# Patient Record
Sex: Male | Born: 1935 | Race: White | Hispanic: No | Smoking: Former smoker
Health system: Southern US, Community
[De-identification: ages and names within clinical notes are randomized; demographics above are authoritative.]

## PROBLEM LIST (undated history)

## (undated) DIAGNOSIS — E7800 Pure hypercholesterolemia, unspecified: Secondary | ICD-10-CM

## (undated) DIAGNOSIS — G20A1 Parkinson's disease without dyskinesia, without mention of fluctuations: Secondary | ICD-10-CM

## (undated) DIAGNOSIS — H5702 Anisocoria: Secondary | ICD-10-CM

## (undated) DIAGNOSIS — I951 Orthostatic hypotension: Secondary | ICD-10-CM

## (undated) DIAGNOSIS — G909 Disorder of the autonomic nervous system, unspecified: Secondary | ICD-10-CM

## (undated) DIAGNOSIS — M1990 Unspecified osteoarthritis, unspecified site: Secondary | ICD-10-CM

## (undated) DIAGNOSIS — E0590 Thyrotoxicosis, unspecified without thyrotoxic crisis or storm: Secondary | ICD-10-CM

## (undated) DIAGNOSIS — D649 Anemia, unspecified: Secondary | ICD-10-CM

## (undated) DIAGNOSIS — K219 Gastro-esophageal reflux disease without esophagitis: Secondary | ICD-10-CM

## (undated) DIAGNOSIS — I517 Cardiomegaly: Secondary | ICD-10-CM

## (undated) DIAGNOSIS — G20 Parkinson's disease: Secondary | ICD-10-CM

## (undated) DIAGNOSIS — H9191 Unspecified hearing loss, right ear: Secondary | ICD-10-CM

## (undated) HISTORY — DX: Thyrotoxicosis, unspecified without thyrotoxic crisis or storm: E05.90

## (undated) HISTORY — DX: Disorder of the autonomic nervous system, unspecified: G90.9

## (undated) HISTORY — DX: Cardiomegaly: I51.7

## (undated) HISTORY — DX: Parkinson's disease: G20

## (undated) HISTORY — DX: Gastro-esophageal reflux disease without esophagitis: K21.9

## (undated) HISTORY — DX: Pure hypercholesterolemia, unspecified: E78.00

## (undated) HISTORY — DX: Parkinson's disease without dyskinesia, without mention of fluctuations: G20.A1

## (undated) HISTORY — PX: COLONOSCOPY: SHX174

## (undated) HISTORY — DX: Orthostatic hypotension: I95.1

## (undated) HISTORY — PX: CATARACT EXTRACTION, BILATERAL: SHX1313

## (undated) HISTORY — PX: VASECTOMY: SHX75

## (undated) HISTORY — DX: Unspecified osteoarthritis, unspecified site: M19.90

## (undated) HISTORY — DX: Anemia, unspecified: D64.9

## (undated) HISTORY — DX: Unspecified hearing loss, right ear: H91.91

## (undated) HISTORY — DX: Anisocoria: H57.02

---
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
## 1968-04-02 HISTORY — PX: SEPTOPLASTY: SUR1290

## 2000-01-03 ENCOUNTER — Ambulatory Visit (HOSPITAL_BASED_OUTPATIENT_CLINIC_OR_DEPARTMENT_OTHER): Admission: RE | Admit: 2000-01-03 | Discharge: 2000-01-03 | Payer: Self-pay | Admitting: Orthopedic Surgery

## 2002-06-17 ENCOUNTER — Encounter: Payer: Self-pay | Admitting: Gastroenterology

## 2004-02-10 ENCOUNTER — Ambulatory Visit: Payer: Self-pay | Admitting: Internal Medicine

## 2004-05-31 ENCOUNTER — Ambulatory Visit: Payer: Self-pay | Admitting: Internal Medicine

## 2005-01-19 ENCOUNTER — Ambulatory Visit: Payer: Self-pay | Admitting: Internal Medicine

## 2005-06-04 ENCOUNTER — Ambulatory Visit: Payer: Self-pay | Admitting: Internal Medicine

## 2005-06-22 ENCOUNTER — Ambulatory Visit: Payer: Self-pay | Admitting: Internal Medicine

## 2006-01-23 ENCOUNTER — Ambulatory Visit: Payer: Self-pay | Admitting: Internal Medicine

## 2006-01-23 LAB — CONVERTED CEMR LAB
ALT: 18 units/L (ref 0–40)
AST: 22 units/L (ref 0–37)
Chol/HDL Ratio, serum: 3.5
Triglyceride fasting, serum: 79 mg/dL (ref 0–149)

## 2006-06-18 ENCOUNTER — Ambulatory Visit: Payer: Self-pay | Admitting: Internal Medicine

## 2006-06-18 ENCOUNTER — Encounter: Payer: Self-pay | Admitting: Family Medicine

## 2006-06-18 LAB — CONVERTED CEMR LAB
Cholesterol: 107 mg/dL (ref 0–200)
Creatinine, Ser: 1 mg/dL (ref 0.4–1.5)
Hgb A1c MFr Bld: 5.9 % (ref 4.6–6.0)
LDL Cholesterol: 58 mg/dL (ref 0–99)
Total CHOL/HDL Ratio: 4
Total CK: 61 units/L (ref 7–195)
Triglycerides: 115 mg/dL (ref 0–149)
VLDL: 23 mg/dL (ref 0–40)
Vit D, 1,25-Dihydroxy: 34 (ref 20–57)

## 2006-07-02 HISTORY — PX: CARDIAC CATHETERIZATION: SHX172

## 2006-07-05 ENCOUNTER — Ambulatory Visit: Payer: Self-pay

## 2006-07-26 ENCOUNTER — Ambulatory Visit: Payer: Self-pay | Admitting: Cardiology

## 2006-07-26 LAB — CONVERTED CEMR LAB
Basophils Relative: 0.2 % (ref 0.0–1.0)
CO2: 33 meq/L — ABNORMAL HIGH (ref 19–32)
Chloride: 106 meq/L (ref 96–112)
Eosinophils Absolute: 0.1 10*3/uL (ref 0.0–0.6)
Eosinophils Relative: 2 % (ref 0.0–5.0)
GFR calc Af Amer: 107 mL/min
GFR calc non Af Amer: 88 mL/min
Monocytes Relative: 11.7 % — ABNORMAL HIGH (ref 3.0–11.0)
Potassium: 4.5 meq/L (ref 3.5–5.1)
Prothrombin Time: 11.4 s (ref 10.0–14.0)
RDW: 11.1 % — ABNORMAL LOW (ref 11.5–14.6)
WBC: 5.3 10*3/uL (ref 4.5–10.5)

## 2006-07-29 ENCOUNTER — Ambulatory Visit: Payer: Self-pay | Admitting: Cardiology

## 2006-07-29 ENCOUNTER — Ambulatory Visit (HOSPITAL_COMMUNITY): Admission: RE | Admit: 2006-07-29 | Discharge: 2006-07-29 | Payer: Self-pay | Admitting: Cardiology

## 2006-08-16 ENCOUNTER — Ambulatory Visit: Admission: RE | Admit: 2006-08-16 | Discharge: 2006-08-16 | Payer: Self-pay | Admitting: Cardiology

## 2006-08-16 ENCOUNTER — Ambulatory Visit: Payer: Self-pay | Admitting: Cardiology

## 2006-09-10 ENCOUNTER — Ambulatory Visit: Payer: Self-pay | Admitting: Internal Medicine

## 2006-10-09 ENCOUNTER — Ambulatory Visit: Payer: Self-pay | Admitting: Internal Medicine

## 2006-10-09 LAB — CONVERTED CEMR LAB
HDL: 33.5 mg/dL — ABNORMAL LOW (ref >39.0)
Hgb A1c MFr Bld: 5.9 % (ref 4.6–6.0)
LDL Cholesterol: 86 mg/dL (ref 0–99)

## 2006-10-11 ENCOUNTER — Ambulatory Visit: Payer: Self-pay | Admitting: Internal Medicine

## 2006-10-11 DIAGNOSIS — K219 Gastro-esophageal reflux disease without esophagitis: Secondary | ICD-10-CM

## 2006-10-11 LAB — CONVERTED CEMR LAB: LDL Goal: 130 mg/dL

## 2006-11-07 ENCOUNTER — Ambulatory Visit: Payer: Self-pay | Admitting: Gastroenterology

## 2006-12-11 ENCOUNTER — Encounter: Payer: Self-pay | Admitting: Internal Medicine

## 2006-12-11 ENCOUNTER — Ambulatory Visit: Payer: Self-pay | Admitting: Gastroenterology

## 2006-12-11 ENCOUNTER — Encounter: Payer: Self-pay | Admitting: Gastroenterology

## 2007-03-11 ENCOUNTER — Ambulatory Visit: Payer: Self-pay | Admitting: Internal Medicine

## 2007-04-18 ENCOUNTER — Telehealth (INDEPENDENT_AMBULATORY_CARE_PROVIDER_SITE_OTHER): Payer: Self-pay | Admitting: *Deleted

## 2007-05-29 ENCOUNTER — Telehealth (INDEPENDENT_AMBULATORY_CARE_PROVIDER_SITE_OTHER): Payer: Self-pay | Admitting: *Deleted

## 2007-05-30 ENCOUNTER — Encounter: Payer: Self-pay | Admitting: Internal Medicine

## 2007-07-10 DIAGNOSIS — E7800 Pure hypercholesterolemia, unspecified: Secondary | ICD-10-CM | POA: Insufficient documentation

## 2007-07-10 DIAGNOSIS — M129 Arthropathy, unspecified: Secondary | ICD-10-CM

## 2008-01-09 ENCOUNTER — Ambulatory Visit: Payer: Self-pay | Admitting: Internal Medicine

## 2008-01-09 DIAGNOSIS — E559 Vitamin D deficiency, unspecified: Secondary | ICD-10-CM

## 2008-01-12 ENCOUNTER — Telehealth (INDEPENDENT_AMBULATORY_CARE_PROVIDER_SITE_OTHER): Payer: Self-pay | Admitting: *Deleted

## 2008-01-12 ENCOUNTER — Encounter (INDEPENDENT_AMBULATORY_CARE_PROVIDER_SITE_OTHER): Payer: Self-pay | Admitting: *Deleted

## 2008-01-16 ENCOUNTER — Ambulatory Visit: Payer: Self-pay | Admitting: Internal Medicine

## 2008-01-16 LAB — CONVERTED CEMR LAB
Cholesterol, target level: 200 mg/dL
HDL goal, serum: 40 mg/dL

## 2008-01-18 ENCOUNTER — Encounter (INDEPENDENT_AMBULATORY_CARE_PROVIDER_SITE_OTHER): Payer: Self-pay | Admitting: *Deleted

## 2008-01-18 LAB — CONVERTED CEMR LAB
Free T4: 3.1 ng/dL — ABNORMAL HIGH (ref 0.6–1.6)
TSH: 0.13 microintl units/mL — ABNORMAL LOW (ref 0.35–5.50)

## 2008-01-19 ENCOUNTER — Telehealth (INDEPENDENT_AMBULATORY_CARE_PROVIDER_SITE_OTHER): Payer: Self-pay | Admitting: *Deleted

## 2008-01-19 ENCOUNTER — Ambulatory Visit: Payer: Self-pay | Admitting: Internal Medicine

## 2008-01-19 LAB — CONVERTED CEMR LAB: OCCULT 3: NEGATIVE

## 2008-01-20 ENCOUNTER — Encounter (INDEPENDENT_AMBULATORY_CARE_PROVIDER_SITE_OTHER): Payer: Self-pay | Admitting: *Deleted

## 2008-01-22 ENCOUNTER — Encounter (HOSPITAL_COMMUNITY): Admission: RE | Admit: 2008-01-22 | Discharge: 2008-03-30 | Payer: Self-pay | Admitting: Internal Medicine

## 2008-01-23 ENCOUNTER — Encounter (INDEPENDENT_AMBULATORY_CARE_PROVIDER_SITE_OTHER): Payer: Self-pay | Admitting: *Deleted

## 2008-01-27 ENCOUNTER — Ambulatory Visit (HOSPITAL_COMMUNITY): Admission: RE | Admit: 2008-01-27 | Discharge: 2008-01-27 | Payer: Self-pay | Admitting: Family Medicine

## 2008-01-27 ENCOUNTER — Telehealth (INDEPENDENT_AMBULATORY_CARE_PROVIDER_SITE_OTHER): Payer: Self-pay | Admitting: *Deleted

## 2008-02-01 HISTORY — PX: OTHER SURGICAL HISTORY: SHX169

## 2008-04-16 ENCOUNTER — Ambulatory Visit: Payer: Self-pay | Admitting: Internal Medicine

## 2008-04-25 LAB — CONVERTED CEMR LAB
AST: 31 units/L (ref 0–37)
Albumin: 4 g/dL (ref 3.5–5.2)
Alkaline Phosphatase: 79 units/L (ref 39–117)
LDL Cholesterol: 103 mg/dL — ABNORMAL HIGH (ref 0–99)
Total Bilirubin: 1.2 mg/dL (ref 0.3–1.2)
Total CHOL/HDL Ratio: 3.4
Total Protein: 7 g/dL (ref 6.0–8.3)
VLDL: 20 mg/dL (ref 0–40)

## 2008-04-26 ENCOUNTER — Encounter (INDEPENDENT_AMBULATORY_CARE_PROVIDER_SITE_OTHER): Payer: Self-pay | Admitting: *Deleted

## 2008-05-05 ENCOUNTER — Ambulatory Visit: Payer: Self-pay | Admitting: Internal Medicine

## 2008-05-06 ENCOUNTER — Encounter: Payer: Self-pay | Admitting: Internal Medicine

## 2008-05-06 ENCOUNTER — Telehealth (INDEPENDENT_AMBULATORY_CARE_PROVIDER_SITE_OTHER): Payer: Self-pay | Admitting: *Deleted

## 2008-05-06 LAB — CONVERTED CEMR LAB
Free T4: 0.3 ng/dL — ABNORMAL LOW (ref 0.6–1.6)
T3, Free: 1.8 pg/mL — ABNORMAL LOW (ref 2.3–4.2)
TSH: 14.43 microintl units/mL — ABNORMAL HIGH (ref 0.35–5.50)

## 2008-07-01 ENCOUNTER — Ambulatory Visit: Payer: Self-pay | Admitting: Internal Medicine

## 2008-07-05 ENCOUNTER — Telehealth (INDEPENDENT_AMBULATORY_CARE_PROVIDER_SITE_OTHER): Payer: Self-pay | Admitting: *Deleted

## 2008-07-07 ENCOUNTER — Ambulatory Visit: Payer: Self-pay | Admitting: Internal Medicine

## 2008-07-07 DIAGNOSIS — E890 Postprocedural hypothyroidism: Secondary | ICD-10-CM | POA: Insufficient documentation

## 2008-08-04 ENCOUNTER — Ambulatory Visit: Payer: Self-pay | Admitting: Internal Medicine

## 2008-08-06 ENCOUNTER — Telehealth (INDEPENDENT_AMBULATORY_CARE_PROVIDER_SITE_OTHER): Payer: Self-pay | Admitting: *Deleted

## 2008-08-06 ENCOUNTER — Encounter (INDEPENDENT_AMBULATORY_CARE_PROVIDER_SITE_OTHER): Payer: Self-pay | Admitting: *Deleted

## 2008-10-01 ENCOUNTER — Ambulatory Visit: Payer: Self-pay | Admitting: Internal Medicine

## 2008-10-04 LAB — CONVERTED CEMR LAB: TSH: 3.18 microintl units/mL (ref 0.35–5.50)

## 2008-10-05 ENCOUNTER — Encounter (INDEPENDENT_AMBULATORY_CARE_PROVIDER_SITE_OTHER): Payer: Self-pay | Admitting: *Deleted

## 2008-11-25 ENCOUNTER — Ambulatory Visit: Payer: Self-pay | Admitting: Internal Medicine

## 2008-12-07 ENCOUNTER — Encounter (INDEPENDENT_AMBULATORY_CARE_PROVIDER_SITE_OTHER): Payer: Self-pay | Admitting: *Deleted

## 2009-01-26 ENCOUNTER — Ambulatory Visit: Payer: Self-pay | Admitting: Internal Medicine

## 2009-05-30 ENCOUNTER — Ambulatory Visit: Payer: Self-pay | Admitting: Internal Medicine

## 2009-06-02 ENCOUNTER — Ambulatory Visit: Payer: Self-pay | Admitting: Internal Medicine

## 2009-06-02 LAB — CONVERTED CEMR LAB
Glucose, Urine, Semiquant: NEGATIVE
Protein, U semiquant: NEGATIVE
Specific Gravity, Urine: <1.005
Urobilinogen, UA: 0.2
WBC Urine, dipstick: NEGATIVE
pH: 6.5

## 2009-06-06 LAB — CONVERTED CEMR LAB
AST: 32 units/L (ref 0–37)
BUN: 16 mg/dL (ref 6–23)
Basophils Absolute: 0 10*3/uL (ref 0.0–0.1)
CO2: 32 meq/L (ref 19–32)
Chloride: 105 meq/L (ref 96–112)
Cholesterol: 130 mg/dL (ref 0–200)
Creatinine, Ser: 1.2 mg/dL (ref 0.4–1.5)
Eosinophils Relative: 1.7 % (ref 0.0–5.0)
Glucose, Bld: 85 mg/dL (ref 70–99)
HCT: 42.6 % (ref 39.0–52.0)
Hemoglobin: 14 g/dL (ref 13.0–17.0)
Lymphocytes Relative: 31.1 % (ref 12.0–46.0)
Monocytes Relative: 8.1 % (ref 3.0–12.0)
Neutro Abs: 3.1 10*3/uL (ref 1.4–7.7)
PSA: 3.6 ng/mL (ref 0.10–4.00)
Potassium: 4.6 meq/L (ref 3.5–5.1)
Sodium: 139 meq/L (ref 135–145)
TSH: 3.78 microintl units/mL (ref 0.35–5.50)
Triglycerides: 100 mg/dL (ref 0.0–149.0)
VLDL: 20 mg/dL (ref 0.0–40.0)
WBC: 5.2 10*3/uL (ref 4.5–10.5)

## 2009-06-09 ENCOUNTER — Ambulatory Visit: Payer: Self-pay | Admitting: Internal Medicine

## 2009-06-09 DIAGNOSIS — K5909 Other constipation: Secondary | ICD-10-CM

## 2009-06-22 ENCOUNTER — Telehealth (INDEPENDENT_AMBULATORY_CARE_PROVIDER_SITE_OTHER): Payer: Self-pay | Admitting: *Deleted

## 2009-07-06 ENCOUNTER — Encounter: Payer: Self-pay | Admitting: Internal Medicine

## 2009-08-30 ENCOUNTER — Telehealth (INDEPENDENT_AMBULATORY_CARE_PROVIDER_SITE_OTHER): Payer: Self-pay | Admitting: *Deleted

## 2010-01-23 ENCOUNTER — Ambulatory Visit: Payer: Self-pay | Admitting: Internal Medicine

## 2010-04-06 ENCOUNTER — Telehealth (INDEPENDENT_AMBULATORY_CARE_PROVIDER_SITE_OTHER): Payer: Self-pay | Admitting: *Deleted

## 2010-04-23 ENCOUNTER — Encounter: Payer: Self-pay | Admitting: Internal Medicine

## 2010-04-30 LAB — CONVERTED CEMR LAB
ALT: 29 units/L (ref 0–53)
BUN: 17 mg/dL (ref 6–23)
Basophils Absolute: 0 10*3/uL (ref 0.0–0.1)
Basophils Relative: 0.1 % (ref 0.0–3.0)
Bilirubin, Direct: 0.2 mg/dL (ref 0.0–0.3)
CO2: 32 meq/L (ref 19–32)
Chloride: 105 meq/L (ref 96–112)
Creatinine, Ser: 1 mg/dL (ref 0.4–1.5)
Eosinophils Absolute: 0.1 10*3/uL (ref 0.0–0.7)
Glucose, Bld: 102 mg/dL — ABNORMAL HIGH (ref 70–99)
Hemoglobin: 14.9 g/dL (ref 13.0–17.0)
Monocytes Absolute: 0.5 10*3/uL (ref 0.1–1.0)
Monocytes Relative: 8.8 % (ref 3.0–12.0)
Neutrophils Relative %: 56 % (ref 43.0–77.0)
Platelets: 235 10*3/uL (ref 150–400)
RDW: 12.3 % (ref 11.5–14.6)
Rhuematoid fact SerPl-aCnc: <20 intl units/mL — ABNORMAL LOW (ref 0.0–20.0)
Sed Rate: 10 mm/hr (ref 0–16)
Total Bilirubin: 1.1 mg/dL (ref 0.3–1.2)
Total Protein: 7 g/dL (ref 6.0–8.3)
WBC: 5.6 10*3/uL (ref 4.5–10.5)

## 2010-05-02 NOTE — Progress Notes (Signed)
Summary: Ingrown Toenail  Phone Note Call from Patient Call back at Home Phone [phone]   Caller: Patient Summary of Call: Message left on VM: Patient with ingrown toenails and he made an appointment with a specialist, Dr. Harriet Pho. Patient now needs a referral. Patient not sure if he would have to come in first.  Initial call taken by: Shonna Chock,  June 22, 2009 3:00 PM  Follow-up for Phone Call        Patient aware order was placed and faxed, patient with pending appointment 07/11/09 @ 10:00am Follow-up by: Shonna Chock,  June 22, 2009 3:11 PM

## 2010-05-02 NOTE — Consult Note (Signed)
Summary: [facility]   Imported By: Lanelle Bal 07/12/2009 11:11:26  _____________________________________________________________________  External Attachment:    Type:   Image     Comment:   External Document

## 2010-05-02 NOTE — Assessment & Plan Note (Signed)
Summary: CPX,RENEW MEDS,LABS PRIOR,HUMANA/RH......   Vital Signs:  Patient profile:   77 year old male Height:      73.25 inches Weight:      199.6 pounds BMI:     26.25 Temp:     98.0 degrees F oral Resp:     17 per minute BP sitting:   124 / 80  (left arm) Cuff size:   large  Vitals Entered By: Shonna Chock (June 09, 2009 1:53 PM)   History of Present Illness: Mr William Williamson is here for a physical; he has chronic constipation since thyroid treatments despite Metamucil , prunes, fruit  & fiber. Preventive & survelliance interventions discussed; all addressed by Mr [name].  Allergies: 1)  ! * Garlic 2)  ! Niacin  Past History:  Past Medical History: ARTHRITIS (ICD-716.90) HYPERCHOLESTEROLEMIA (ICD-272.0) G E R D (ICD-530.81) GLAUCOMA SUSPECT  Hyperthyroidism, S/P RAI  Past Surgical History: Septoplasty 1970 cath 07/2006 negative, Dr Juanda Chance Colonoscopy neg 2004, Dr Jarold Motto ,due 2014 Vasectomy EGD neg 1997; RAI ablation 02/2008  Family History: Father:  died  natural causes @ 47 Mother: osteoporosis Siblings: sister S/P RAI Rx;sister  HTN; 2 bro HTN; 1 bro DM,prostate tumor;  1 bro CBD CA; MGM stomach CA;MGF MI @ 66  Social History: Retired Alcohol use-yes: socially Regular exercise-yes:walks 3 mpd  Single  Review of Systems  The patient denies anorexia, fever, weight loss, weight gain, vision loss, decreased hearing, hoarseness, chest pain, syncope, dyspnea on exertion, peripheral edema, prolonged cough, headaches, hemoptysis, abdominal pain, melena, hematochezia, severe indigestion/heartburn, hematuria, incontinence, suspicious skin lesions, depression, unusual weight change, abnormal bleeding, enlarged lymph nodes, and angioedema.    Physical Exam  General:  Thin, appears younger thyan age,well-nourished,in no acute distress; alert,appropriate and cooperative throughout examination Head:  Normocephalic and atraumatic without obvious abnormalities.  Pattern  alopecia  Eyes:  No corneal or conjunctival inflammation noted.Perrla. Funduscopic exam benign, without hemorrhages, exudates or papilledema.  Ears:  External ear exam shows no significant lesions or deformities.  Otoscopic examination reveals clear canals, tympanic membranes are intact bilaterally without bulging, retraction, inflammation or discharge. Hearing is grossly normal bilaterally. Nose:  External nasal examination shows no deformity or inflammation. Nasal mucosa are pink and moist without lesions or exudates. Septal deviation Mouth:  Oral mucosa and oropharynx without lesions or exudates.  Teeth in good repair. Neck:  No deformities, masses, or tenderness noted.Thyroid small Lungs:  Normal respiratory effort, chest expands symmetrically. Lungs are clear to auscultation, no crackles or wheezes. Heart:  Normal rate and regular rhythm. S1 and S2 normal without gallop, murmur, click, rub .S4 Abdomen:  Bowel sounds positive,abdomen soft and non-tender without masses, organomegaly or hernias noted. Rectal:  No external abnormalities noted. Normal sphincter tone. No rectal masses or tenderness. Genitalia:  Testes bilaterally descended without nodularity, tenderness or masses. No scrotal masses or lesions. No penis lesions or urethral discharge.L varicocele.   Prostate:  Prostate gland firm and smooth, no enlargement, nodularity, tenderness, mass, asymmetry or induration. Msk:  No deformity or scoliosis noted of thoracic or lumbar spine.   Pulses:  R and L carotid,radial,dorsalis pedis and posterior tibial pulses are full and equal bilaterally Extremities:  No clubbing, cyanosis, edema, or deformity noted with normal full range of motion of all joints.Mild crepitus of knees   Neurologic:  alert & oriented X3, gait normal, and DTRs symmetrical and normal.   Skin:  Intact without suspicious lesions or rashes Cervical Nodes:  No lymphadenopathy noted Axillary Nodes:  No palpable  lymphadenopathy Psych:  memory intact for recent and remote, normally interactive, and good eye contact.     Impression & Recommendations:  Problem # 1:  PREVENTIVE HEALTH CARE (ICD-V70.0)  Orders: EKG w/ Interpretation (93000)  Problem # 2:  CONSTIPATION, CHRONIC (ICD-564.09)  Problem # 3:  UNSPECIFIED HYPOTHYROIDISM (ICD-244.9)  Post RAI for hyperthyroidism His updated medication list for this problem includes:    Levothyroxine Sodium 100 Mcg Tabs (Levothyroxine sodium) .Marland Kitchen... 1 by mouth once daily, additional refills require lab appointment  Orders: Prescription Created Electronically [phone])  Problem # 4:  HYPERCHOLESTEROLEMIA (ICD-272.0)  His updated medication list for this problem includes:    Crestor 20 Mg Tabs (Rosuvastatin calcium) .Marland Kitchen... 1 qd  Orders: EKG w/ Interpretation (93000) Prescription Created Electronically [phone])  Complete Medication List: 1)  Travatan 0005%  2)  Baby Asa  3)  Multivitamins Tabs (Multiple vitamin) .Marland Kitchen.. 1 by mouth once daily 4)  Fish Oil 1000 Mg Caps (Omega-3 fatty acids) .... Once daily 5)  Flax Seed Oil 1300 Mg Caps (flaxseed (linseed))  .Marland Kitchen.. 1 by mouth once daily 6)  Crestor 20 Mg Tabs (Rosuvastatin calcium) .Marland Kitchen.. 1 qd 7)  Vitamin D3 2000 Unit Caps (Cholecalciferol) .Marland Kitchen.. 1 by mouth once daily 8)  Levothyroxine Sodium 100 Mcg Tabs (Levothyroxine sodium) .Marland Kitchen.. 1 by mouth once daily, additional refills require lab appointment 9)  Eql Coq10 300 Mg Caps (Coenzyme q10) .Marland Kitchen.. 1 by mouth once daily  Patient Instructions: 1)  Miralax every 3rd day  for constipation  as needed  Prescriptions: LEVOTHYROXINE SODIUM 100 MCG TABS (LEVOTHYROXINE SODIUM) 1 by mouth once daily, Additional refills Require lab appointment  #90 x 3   Entered and Authorized by:   Marga Melnick MD   Signed by:   Marga Melnick MD on 06/09/2009   Method used:   Faxed to ...       [facility]       [address]       [place], [place]  [address]       Ph: [phone]       Fax: [phone]   RxID:   [id number] CRESTOR 20 MG TABS (ROSUVASTATIN CALCIUM) 1 qd  #90 x 3   Entered and Authorized by:   Marga Melnick MD   Signed by:   Marga Melnick MD on 06/09/2009   Method used:   Faxed to ...       [facility]       [address]       [place], [place]  [address]       Ph: [phone]       Fax: [phone]   RxID:   [id number]

## 2010-05-02 NOTE — Assessment & Plan Note (Signed)
Summary: FLU SHOT///SPH  Nurse Visit  CC: Flu shot./kb   Allergies: 1)  ! * Garlic 2)  ! Niacin  Orders Added: 1)  Flu Vaccine 25yrs + MEDICARE PATIENTS [Q2039] 2)  Administration Flu vaccine - MCR [G0008]               Flu Vaccine Consent Questions     Do you have a history of severe allergic reactions to this vaccine? no    Any prior history of allergic reactions to egg and/or gelatin? no    Do you have a sensitivity to the preservative Thimersol? no    Do you have a past history of Guillan-Barre Syndrome? no    Do you currently have an acute febrile illness? no    Have you ever had a severe reaction to latex? no    Vaccine information given and explained to patient? yes    Are you currently pregnant? no    Lot Number:[id number]   Exp Date:09/30/2010   Site Given  Left Deltoid IMu

## 2010-05-02 NOTE — Progress Notes (Signed)
Summary: TWO 90 DAY PRESCRIPTIONS FOR RIGHT SOURCE  Phone Note Call from Patient Call back at Home Phone [phone]   Caller: Patient Summary of Call: PATIENT DROPPED OFF RIGHT SOURCE FORM FOR TWO 90 DAY PRESCRIPTIONS PLUS REFILLS FOR LEVOTHYROXIN AND FOR CRESTOR  PLEASE COMPLETE AND FAX TO [phone]  WILL TAKE FORM BACK TO [facility] Initial call taken by: Jerolyn Shin,  Aug 30, 2009 11:27 AM  Follow-up for Phone Call        Faxed to: [phone] Follow-up by: Shonna Chock,  August 31, 2009 10:09 AM    Prescriptions: LEVOTHYROXINE SODIUM 100 MCG TABS (LEVOTHYROXINE SODIUM) 1 by mouth once daily, Additional refills Require lab appointment  #90 x 2   Entered by:   Shonna Chock   Authorized by:   Marga Melnick MD   Signed by:   Shonna Chock on 08/31/2009   Method used:   Print then Give to Patient   RxID:   [id number] CRESTOR 20 MG TABS (ROSUVASTATIN CALCIUM) 1 qd  #90 x 2   Entered by:   Shonna Chock   Authorized by:   Marga Melnick MD   Signed by:   Shonna Chock on 08/31/2009   Method used:   Print then Give to Patient   RxID:   [id number]

## 2010-05-04 NOTE — Progress Notes (Signed)
Summary: Refill Request  Phone Note Refill Request Call back at [phone] Message from:  Pharmacy on April 06, 2010 8:14 AM  Refills Requested: Medication #1:  LEVOTHYROXINE SODIUM 100 MCG TABS 1 by mouth once daily   Dosage confirmed as above?Dosage Confirmed   Supply Requested: 3 months   Last Refilled: 08/31/2009 Right Source  Next Appointment Scheduled: 3.15.12 Initial call taken by: Harold Barban,  April 06, 2010 8:14 AM  Follow-up for Phone Call        Rx faxed to:  [phone] Follow-up by: Shonna Chock CMA,  April 06, 2010 9:02 AM    Prescriptions: LEVOTHYROXINE SODIUM 100 MCG TABS (LEVOTHYROXINE SODIUM) 1 by mouth once daily, Additional refills Require lab appointment  #90 x 0   Entered by:   Shonna Chock CMA   Authorized by:   Marga Melnick MD   Signed by:   Shonna Chock CMA on 04/06/2010   Method used:   Print then Give to Patient   RxID:   [id number]

## 2010-06-10 ENCOUNTER — Encounter: Payer: Self-pay | Admitting: Internal Medicine

## 2010-06-13 ENCOUNTER — Encounter: Payer: Self-pay | Admitting: Internal Medicine

## 2010-06-13 ENCOUNTER — Other Ambulatory Visit: Payer: Self-pay | Admitting: Internal Medicine

## 2010-06-13 ENCOUNTER — Encounter (INDEPENDENT_AMBULATORY_CARE_PROVIDER_SITE_OTHER): Payer: Medicare HMO | Admitting: Internal Medicine

## 2010-06-13 DIAGNOSIS — Z136 Encounter for screening for cardiovascular disorders: Secondary | ICD-10-CM

## 2010-06-13 DIAGNOSIS — D649 Anemia, unspecified: Secondary | ICD-10-CM | POA: Insufficient documentation

## 2010-06-13 DIAGNOSIS — K5909 Other constipation: Secondary | ICD-10-CM

## 2010-06-13 DIAGNOSIS — K219 Gastro-esophageal reflux disease without esophagitis: Secondary | ICD-10-CM

## 2010-06-13 DIAGNOSIS — E559 Vitamin D deficiency, unspecified: Secondary | ICD-10-CM

## 2010-06-13 DIAGNOSIS — E7800 Pure hypercholesterolemia, unspecified: Secondary | ICD-10-CM

## 2010-06-13 DIAGNOSIS — E039 Hypothyroidism, unspecified: Secondary | ICD-10-CM

## 2010-06-13 DIAGNOSIS — E319 Polyglandular dysfunction, unspecified: Secondary | ICD-10-CM

## 2010-06-13 DIAGNOSIS — Z0000 Encounter for general adult medical examination without abnormal findings: Secondary | ICD-10-CM

## 2010-06-13 LAB — CBC WITH DIFFERENTIAL/PLATELET
Basophils Absolute: 0 10*3/uL (ref 0.0–0.1)
Eosinophils Absolute: 0.1 10*3/uL (ref 0.0–0.7)
HCT: 42.5 % (ref 39.0–52.0)
Hemoglobin: 14.6 g/dL (ref 13.0–17.0)
Lymphs Abs: 1.2 10*3/uL (ref 0.7–4.0)
MCHC: 34.5 g/dL (ref 30.0–36.0)
MCV: 95.4 fl (ref 78.0–100.0)
Monocytes Absolute: 0.4 10*3/uL (ref 0.1–1.0)
Monocytes Relative: 7.4 % (ref 3.0–12.0)
Neutro Abs: 3.6 10*3/uL (ref 1.4–7.7)
Platelets: 203 10*3/uL (ref 150.0–400.0)
RDW: 13.3 % (ref 11.5–14.6)

## 2010-06-13 LAB — BASIC METABOLIC PANEL
BUN: 16 mg/dL (ref 6–23)
CO2: 30 mEq/L (ref 19–32)
GFR: 68.6 mL/min (ref >60.00)
Glucose, Bld: 97 mg/dL (ref 70–99)
Potassium: 5 mEq/L (ref 3.5–5.1)
Sodium: 139 mEq/L (ref 135–145)

## 2010-06-13 LAB — TSH: TSH: 3.69 u[IU]/mL (ref 0.35–5.50)

## 2010-06-13 LAB — LIPID PANEL
Cholesterol: 166 mg/dL (ref 0–200)
VLDL: 14 mg/dL (ref 0.0–40.0)

## 2010-06-13 LAB — HEPATIC FUNCTION PANEL
Albumin: 4.5 g/dL (ref 3.5–5.2)
Total Bilirubin: 0.7 mg/dL (ref 0.3–1.2)

## 2010-06-14 LAB — CONVERTED CEMR LAB: Vit D, 25-Hydroxy: 39 ng/mL (ref 30–89)
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
## 2010-08-15 NOTE — Assessment & Plan Note (Signed)
[facility]                             PULMONARY OFFICE NOTE   William Williamson, William Williamson                  MRN:          [id number]  DATE:09/10/2006                            DOB:          [birth year]    CHIEF COMPLAINT:  Chest pain and dyspnea.   HISTORY OF PRESENT ILLNESS:  This is a 77 year old white male remote  smoker with relatively acute onset of chest discomfort that occurred in  February after a cold that interestingly was perfectly reproducible  with exercise (he mentioned going up a hill on his usual walk and also  trying to dig posts as bringing on the discomfort which would resolve  within 5 minutes of resting).  Although he reported dyspnea on exertion,  it turns out that his dyspnea was no different than what it has always  been and was not worsened after the onset of the chest discomfort.  This  did lead to a set of lung functions and ultimately to left heart  catheterization which revealed minimum elevation of right heart  pressures with normal coronary arteries.   The patient states if anything, the discomfort is getting better in that  he can do more before he notices it.  However, again it is perfectly  reproducible with exertion, never occurs at rest and is not associated  with any significant cough or deep breathing.  He described it as more  of a pressure sensation in the center of his chest with no radiation.   PAST MEDICAL HISTORY:  Significant for hyperlipidemia.   ALLERGIES:  None known.   MEDICATIONS:  Include Vytorin and multiple alternatives which include  several different oil based products.   SOCIAL HISTORY:  He quit smoking in 1970.  He is retired with no unusual  travel, pet or hobby exposure.   FAMILY HISTORY:  Negative for respiratory diseases, rheumatologic  disease.   REVIEW OF SYSTEMS:  Taken in detail on the work sheet and negative.   PHYSICAL EXAMINATION:  GENERAL:  This is a moderately anxious  white male  in no acute distress.  VITAL SIGNS:  Normal.  HEENT:  Unremarkable. Pharynx clear. Dentition intact.  NECK:  Supple without cervical adenopathy or tenderness. Trachea is  midline.  LUNG FIELDS:  Perfectly clear bilaterally to auscultation and percussion  with excellent air movement.  CARDIAC:  Regular rhythm without murmur, gallop, rub.  ABDOMEN:  Soft, benign.  EXTREMITIES:  No calf tenderness, cyanosis, clubbing.   Chest CT scan was reviewed from May 16, showing no significant  abnormalities.   Pulmonary function tests were reviewed from Aug 16, 2006 indicating  FEV1: FVC ratio of 62% with normal diffuser capacity and 16% improvement  after bronchodilators.  However, I note the flow volume contour suggests  truncation of inspiratory greater than expiratory loops not typical at  all of asthma or COPD.   IMPRESSION:  1. He does have mild elevation of pulmonary artery pressure by left      heart-cath, performed on July 29, 2006 with a mean pulmonary      pressure of 25 and a wedge of 9.  I could not calculate the      resistance because there was no cardiac output included in the      available information.  I might consider an echocardiogram done      this year and perhaps in a year to see if there is any increase in      pulmonary artery pressures over time and if so, strongly recommend      Dr. Delton Coombes as the physician in pulmonary practice who has the most      experience with pulmonary hypertension workup and treatment.  2. In terms of his present complaints, however, I cannot conceive of      any pulmonary or cardiac problem that would cause this pattern of      discomfort and not be detectable by PFT, chest CT scan and left and      right heart catheterization.  That leaves only one organ in the      chest, the esophagus, as the possible source for his discomfort.      There have been many cases of exertional chest discomfort      attributed to reflux and  therefore I recommended empiric treatment      for reflux for the next 30 days and then follow-up on a p.r.n.      basis.  Specifically I recommended a diet which will exclude all      oil based products and supplements, Zegerid 40 mg at bedtime and      followup by Dr. Alwyn Williamson at the end of 30 days.  Pulmonary followup      can be here p.r.n. and with Dr. Delton Coombes if serial echo's suggest      worsening pulmonary artery hypertension (this mild of an elevation      would not be likely to cause right heart strain and ischemic pain      on this basis, though this would otherwise be in the differential).     William Williamson. William Sires, MD, [facility]  Electronically Signed    MBW/MedQ  DD: 09/10/2006  DT: 09/10/2006  Job #: [id number]   cc:   William Williamson. William Ren, MD,FACP,FCCP

## 2010-08-15 NOTE — Assessment & Plan Note (Signed)
[facility]                         GASTROENTEROLOGY OFFICE NOTE   William Williamson                  MRN:          [id number]  DATE:11/07/2006                            DOB:          12/18/1932    Mr. William Williamson is a 77 year old white male retiree referred for  exertional chest pain.   Over the last several months William Williamson has had rather typical exertional chest  pain in that when he does sudden extreme heavy exercise such as digging  a ditch or shopping wood he has pressing substernal chest pain which is  alleviated by rest.  He can walk up to 3 miles at a time and not have  difficulties.  He denies any other cardiovascular, pulmonary complaints.  He has no GI complaints except for occasional feeling that peanut is  lodged in his posterior pharynx.  Certainly had no burning substernal  chest pain or regurgitation or true dysphagia.  He has had thorough  workups by Dr. Juanda Chance, Dr. Diona Browner and Dr. Sherene Sires and there has been no  evidence of any cardiopulmonary basis for his problems.  This has  included coronary angiography.  He did have slightly elevated pulmonary  pressures and saw Dr. Sherene Sires on September 10, 2006 who obtained pulmonary  function tests and CT scan of the chest.  He put him on Zegerid for 30  days without improvement.  Patient has no lower GI or hepatobiliary  complaints.  He did have a colonoscopy some 4 years ago that was normal.  His only other medical problems are glaucoma and mild  hypercholesterolemia.   MEDICATIONS:  1. Vytorin 10/20 a half a tablet a day.  2. Aspirin 81 mg a day.  3. Variety of multivitamins and supplements including Coenzyme Q.  4. No other real medications except for glaucoma eye drops.   HE DENIES DRUG ALLERGIES.   FAMILY HISTORY:  Remarkable for maternal grandmother with stomach  cancer, also positive for glaucoma and atherosclerosis and  cardiovascular problems.  He has a brother with prostate  cancer.   SOCIAL HISTORY:  Patient is widowed and lives alone.  He has a  Bachelor's degree.  He does not smoke and uses ethanol socially.   REVIEW OF SYSTEMS:  Positive for some degenerative arthritis-type  complaints and hypercholesterolemia.  He denies cough, sputum  production, anorexia, weight loss, fever, chills or any other systemic  complaints.  Specifically denies any neurovascular or psychiatric  problems.   He is a healthy-appearing white male in no distress, appearing younger  than his stated age.  He is 6 feet 1 inches tall and weighs 175 pounds.  Blood pressure 122/80 and pulse was 88 and regular.  Could not  appreciate stigmata of chronic liver disease.  Examination of the  oropharynx and neck areas were normal.  CHEST:  Entirely clear without wheezes or rhonchi.  He appeared to be in a regular rhythm without murmurs, gallops or rubs.  I could not appreciate hepatosplenomegaly, abdominal masses or  tenderness.  Bowel sounds were normal.  PERIPHERAL EXTREMITIES:  Were unremarkable.  Mental status was clear.   ASSESSMENT:  Mr. William Williamson has  very atypical chest pain and I doubt it  is gastrointestinal related since he had a negative response to proton  pump inhibitor therapy for a month.  Certainly possible that he has a  super sensitive esophagus with associated small airways bronchospasm  although again this is unclear.  We will of course go through our usual  workup for extra-esophageal manifestations of gastroesophageal reflux  disease to see if we can help this patient and clarify his diagnosis.   1. Endoscopic exam as soon as possible.  2. Once endoscopy done we will proceed with 24-hour pH probe test and      monometry.  3. Continue other medications as listed above.     [name]. Jarold Motto, MD, [facility], FAGA  Electronically Signed    DRP/MedQ  DD: 11/07/2006  DT: 11/07/2006  Job #: [id number]   cc:   [name]. [name], MD,FACP,FCCP  Jonelle Sidle,  MD  Charlaine Dalton. Sherene Sires, MD, FCCP

## 2010-08-18 NOTE — Assessment & Plan Note (Signed)
[facility]                            CARDIOLOGY OFFICE NOTE   LORIMER, TIBERIO                  MRN:          [id number]  DATE:07/25/2006                            DOB:          Aug 10, 1932    REASON FOR CONSULTATION:  Exertional chest pain.   HISTORY OF PRESENT ILLNESS:  Mr. Vallee is a pleasant 77 year old  male with a 10-year-history of hyperlipidemia treated with statin  therapy, but no long-standing history of hypertension, type 2 diabetes  mellitus or cardiovascular disease.  He remains active and states that  he began experiencing exertional chest tightness back in February of  this year.  Specifically with increased levels of activity such as  walking rapidly or going uphill he would experience chest tightness that  would resolve when he decreased his level of activity or rested for at  least a minute.  He denies having any rest symptoms and otherwise is not  bothered by breathlessness, diaphoresis, nausea, palpitations or  syncope.  He was referred by Dr. [name] for an exercise Myoview back on  the fourth of this month.  He exercised for 5 minutes and 45 seconds and  did experience chest pain. He had no diagnostic ST segment changes  however and his perfusion imaging was normal with an overall ejection  fraction of 67%.  This information was conveyed to him by Dr. [name]  although given the patient's persistent symptoms he is referred now to  discuss the situation further.   I reviewed the situation with Mr. Richert in detail today.  He  remains concerned about the possibility of underlying obstructive  coronary artery disease which is certainly not unreasonable based on his  symptoms.  It is possible that he had a falsely normal noninvasive test.  His resting electrocardiogram today shows normal sinus rhythm at 73  beats per minute. We spoke at some length about the possibility of  proceeding on to a diagnostic cardiac  catheterization to clarify his  coronary anatomy and assess for the possibility of any potential  revascularization options.  We reviewed the risks and benefits of this  and he is in agreement to proceed.  If his coronary anatomy is  reassuring then other possibilities could entertained.   ALLERGIES:  No known drug allergies.   PRESENT MEDICATIONS:  1. Aspirin 81 mg p.o. daily.  2. Vytorin 10/20 mg 1/2 tablet p.o. daily.  3. Multivitamin one p.o. daily.  4. Vitamin B12 1000 mg as directed.  5. Omega-3 supplements 2400 mg daily.  6. Glucosamine supplements.  7. CO-Q-10.  8. Flax seed oil extract.  9. Travatan eye drops.   PAST MEDICAL HISTORY:  Is as outlined above.  He has a previous history  of septoplasty, ankle surgery, vasectomy and glaucoma.   FAMILY HISTORY:  Noncontributory for premature cardiovascular disease.  Both parents lived to old age.  He states his mother died at age 37, his  father died at age 56.   SOCIAL HISTORY:  The patient is a widower. He has three children. His  son is an orthopedic physician in the [employer].  He is retired  from [employer].  He has a remote tobacco use history but quit in January of 1970.  Does  not use any recreational drugs. Drinks alcohol occasionally.  He states  that he has been walking up to three miles per day and tries to stay  very active and fit.   REVIEW OF SYSTEMS:  As described in the history of present illness. He  denies any problems with obvious allergies or trouble with pollen. He  has no history of asthma.  He denies any wheezing, cough, hemoptysis,  fevers or chills.  States that his appetite has been normal.  He has had  no orthopnea or PND.   EXAMINATION:  Blood pressure is 140/88, heart rate is 73, weight is 180  pounds. The patient is comfortable and in no acute distress, appeared  normally nourished.  HEENT:  Conjunctivae was normal.  Pharynx is clear.  Neck is supple, no  elevated jugular venous pressure, without  bruits, no thyromegaly is  noted.  LUNGS:  Clear without labored breathing.  CARDIAC EXAM:  Reveals a regular rate and rhythm, no obvious pathologic  murmur noted, no S3 gallop or pericardial rub.  ABDOMEN:  Soft, no bruits, no tenderness to palpation, no hepatomegaly.  EXTREMITIES:  Exhibit no significant pitting edema.  SKIN:  Warm and dry.  PULSES:  Distal pulses are 2+.  MUSCULOSKELETAL:  Mild pectus excavatum is noted.  NEUROPSYCHIATRIC:  The patient is alert and oriented x3, affect is  normal.   IMPRESSION/RECOMMENDATIONS:  1. Exertional chest pain consistent with angina although in the      setting of a relatively normal exercise Myoview indicating no      electrocardiographic or perfusion abnormalities and overall normal      ejection fraction.  His symptoms however persist. Risk factors      include gender and fairly long-standing hyperlipidemia.  His blood      pressure is elevated today although he has no chronic history of      hypertension.  I reviewed the situation with him in detail and we      discussed the potential risk and benefits of a diagnostic cardiac      catheterization to clarify his coronary anatomy and assess      potential revascularization options.  He is in agreement to proceed      and this will be scheduled through the inpatient diagnostic cardiac      catheterization lab. He prefers intervention at the same time if a      significant stenosis is found.  Otherwise I would consider right      heart catheterization at the same setting to exclude any problems      with pulmonary hypertension.  He will have a baseline chest x-ray      and blood work.  2. Further plans to follow.     Jonelle Sidle, MD  Electronically Signed    SGM/MedQ  DD: 07/26/2006  DT: 07/26/2006  Job #: [id number]

## 2010-08-18 NOTE — Cardiovascular Report (Signed)
William Williamson, William Williamson           ACCOUNT NO.:  [id number]   MEDICAL RECORD NO.:  [id number]          PATIENT TYPE:  OIB   LOCATION:  NA                           FACILITY:  [facility]   PHYSICIAN:  Bruce R. Juanda Chance, MD, FACCDATE OF BIRTH:  1932/11/23   DATE OF PROCEDURE:  07/29/2006  DATE OF DISCHARGE:                            CARDIAC CATHETERIZATION   HISTORY:  [name] is a 76years old and has a history of  hyperlipidemia treated.  He has had symptoms of chest tightness and some  shortness of breath with exertion.  He had a Myoview scan ordered by Dr.  [name], which did not show ischemia.  Because of persistent symptoms, he  was seen in consultation by Dr. Diona Browner who decided with the patient to  proceed with evaluation with angiography.   PROCEDURE:  Left heart catheterization was performed percutaneously via  the right femoral artery, using arterial sheath and 6-French preformed  coronary catheters.  A front wall arterial puncture was performed, and  Omnipaque contrast was used.  After completion of the diagnostic study,  we decided to do a right heart catheterization.  This was performed via  right femoral vein, using a venous sheath and Swan-Ganz motion catheter.  Following completion of the procedure, the right femoral was closed  Angio-Seal.  The patient tolerated the procedure well, and left the  laboratory in satisfactory condition.   RESULTS:  The aortic pressure was 132/66 with mean of 92, and the aortic  pressure was 132/60, and left ventricular pressure is 132/19.  The right  atrial pressure was 2 mean.  The right ventricle pressure was 44/4.  Pulmonary artery pressure was 44/11 with mean of 25, and the pulmonary  artery blood pressure was mean nine.   The left main coronary artery:  The left main coronary artery was free  of significant disease.   Left anterior descending artery:  The left anterior descending artery  gave rise to 4 diagonal branch and 2  septal perforators.  These and the  LAD proper were free of significant disease.   The circumflex artery:  The circumflex artery gave rise to a large  marginal branch and atrial branch and 2 posterolateral branches.  These  vessels were free of significant disease.   The right coronary artery:  The right coronary artery was a moderate-  sized vessel, gave rise to a conus branch, a rise ventricle branch,  posterior branch and two posterolateral branches.  These vessels were  free of significant disease.   The left ventriculogram:  The left ventriculogram and left hip repair  RAO projection showed good wall motion with no areas of hypokinesis.  The estimated fraction was 6%.   CONCLUSION:  1. Normal coronary angiography and left ventricular function.  2. Mild elevation of pulmonary pressures.   RECOMMENDATIONS:  The etiology of the patient's exertional chest  tightness and dyspnea is not clear.  He has no source of ischemia.  I  discussed the findings with Dr. Diona Browner will plan further evaluation  with a CT angio, to rule out pulmonary embolism and pulmonary function  tests and arrange follow-up  with Dr. Diona Browner.      Bruce Elvera Lennox Juanda Chance, MD, [facility]  Electronically Signed     BRB/MEDQ  D:  07/29/2006  T:  07/29/2006  Job:  [id number]   cc:   Jonelle Sidle, MD  [name]. [name], MD,FACP,FCCP  Everardo Beals Juanda Chance, MD, [facility]

## 2010-08-18 NOTE — Assessment & Plan Note (Signed)
[facility]                        [place] OFFICE NOTE   SHAINE, NEWMARK                  MRN:          [id number]  DATE:06/18/2006                            DOB:          Jul 06, 1932    William Williamson was seen June 18, 2006 for a medication refill of  Vytorin 10/20. He had questions as to possible adverse effects  manifested as soreness and weakness in his muscles present for 1 month.  Additionally he was having chest pain with exertion. It was described as  substernal without radiation or associated nausea or diaphoresis. The  chest pain would resolve when he stopped exercising. He was walking an  hour per day. He has no past history of exercise-induced bronchospasm or  asthma.   PAST MEDICAL HISTORY:  Septoplasty, ankle surgery, vasectomy and  colonoscopy. He is on several eyedrops for glaucoma from Dr. Charise Killian.   FAMILY HISTORY:  Positive for stomach cancer, myocardial infarction,  osteoporosis. A brother has diabetes, prostate tumor.   He quit smoking in 1970 after smoking 13 years. He drinks socially.   He has been on glucosamine for joint soreness.   He is 6 foot 1; weight was down 4 pounds to 185, pulse was 60,  respiratory rate 14 and blood pressure 120/76. A positive finding  included arteriolar narrowing. He had a grade 1/2 to 1 systolic murmur.  Hemoccult testing was negative. There were no significant  musculoskeletal findings.   EKG was normal.   His lipids were incredibly good except for an HDL of 27. Also normal  were liver functions, chemistries, CPK. A1c was high normal at 5.9. I  did ask him to restrict carbs such as The Flat Belly Diet . I also  recommended that he decrease the Vytorin to a 1/2 pill at bedtime and  recheck the lipids and A1c in July.   His vitamin D level was mildly reduced at 34 and 1000 international  units of vitamin D daily was recommended.   A nuclear stress test was  performed. It was stopped due to shortness of  breath and chest pain with no significant ST-T wave changes or perfusion  abnormalities. Deconditioning was suggested by his exercise of only 5  minutes and 45 seconds.   I have asked him to hold exercise until he can be evaluated by a  cardiologist. If the cardiac workup is negative, he could be considered  for a variant of exercise-induced bronchospasm but it is paramount is to  rule out significant coronary artery disease.     [name]. [name], MD,FACP,FCCP  Electronically Signed    WFH/MedQ  DD: 07/19/2006  DT: 07/19/2006  Job #: [id number]

## 2010-08-18 NOTE — Op Note (Signed)
[place]  Patient:    William Williamson, William Williamson                  MRN: [id number] Proc. Date: 01/03/00 Adm. Date:  [id number] Attending:  Colbert Ewing                           Operative Report  PREOPERATIVE DIAGNOSIS:  Osteochondritis desiccans with avascular lose body, lateral talar dome right ankle.  Anterior impingement talotibial right ankle with reactive synovitis.  POSTOPERATIVE DIAGNOSIS:  Osteochondritis desiccans with avascular lose body, lateral talar dome right ankle.  Anterior impingement talotibial right ankle with reactive synovitis.  PROCEDURE:  Right ankle exam under anesthesia arthroscopy.  Removal of lose body with microfracture multiple drilling lateral talar done and chondroplasty.  Extensive anterior debridement of tibiotalar spurs to correct the anterior bony impingement.  SURGEON:  Loreta Ave, M.D.  ASSISTANT:  Arlys John D. Petrarca, P.A.-C.  ANESTHESIA:  GEneral.  ESTIMATED BLOOD LOSS:  Minimal.  SPECIMENS:  None.  CULTURES:  None.  COMPLICATIONS:  None.  DRESSINGS:  Soft compressive.  PROCEDURE:  The patient was brought to the operating room and placed on the operating table in supine position.  After adequate anesthesia had been obtained right ankle examined.  Marked bony impingement at about 10 degrees of dorsiflexion confirmed with fluoroscopic guidance.  No instability to talar tilt but does open with anterior drawer.  Tourniquet applied along with a stirrup for support.  Prepped and draped in the usual sterile fashion. Exsanguinated with elevation and Esmarch.  Tourniquet inflated to 250 mmHg. Anteromedial, anterolateral portals into the ankle.  The ankle entered with blunt obturator, distended through the inflow system and the arthroscope introduced.  The ankle inspected.  Relatively large, mostly chondral lose body off the lateral talar dome, grasped with a grasping forcep and removed.  A 1 cm  diameter denuded bony lesion at the site of the osteochondral defect was treated with chondroplasty smoothing off all surfaces.  Treated with multiple microfracturing with a microfracturing system.  Debris cleared with a shaver.  A little grade 2 changes medial talar dome debrided.  Confirmation anterior bony impingement fluoroscopic and arthroscopic.  Sequence of burs and shavers were used to remove all offending impinging spurs from the tibia and talus protecting normal articular cartilage.  At completion I had no anterior bony impingement when viewed arthroscopically.  Full dorsiflexion.  Fluoroscopic guidance used to confirm no bony impingement.  The entire ankle examined including all recesses.  No other significant findings appreciated.  Stability examination unchanged.  Instruments and fluids removed.  Portals and ankle injected with Marcaine.  Portals closed with 0 nylon.  Sterile compressive dressing applied.  Anesthesia reversed and brought to the recovery room.  Tolerated surgery well without complications.DD:  01/03/00 TD:  01/04/00 Job: [id number]

## 2011-01-09 ENCOUNTER — Ambulatory Visit (INDEPENDENT_AMBULATORY_CARE_PROVIDER_SITE_OTHER): Payer: Medicare HMO

## 2011-01-09 DIAGNOSIS — Z23 Encounter for immunization: Secondary | ICD-10-CM

## 2011-04-15 ENCOUNTER — Emergency Department (HOSPITAL_COMMUNITY)
Admission: EM | Admit: 2011-04-15 | Discharge: 2011-04-15 | Disposition: A | Discharge status: Home / Self Care | Payer: MEDICARE | Source: Home / Self Care

## 2011-04-15 ENCOUNTER — Encounter (HOSPITAL_COMMUNITY): Payer: Self-pay | Admitting: *Deleted

## 2011-04-15 DIAGNOSIS — J019 Acute sinusitis, unspecified: Secondary | ICD-10-CM

## 2011-04-15 MED ORDER — LEVOFLOXACIN 500 MG PO TABS: 500.0000 mg | ORAL_TABLET | Freq: Every day | ORAL | Status: AC

## 2011-04-15 NOTE — ED Provider Notes (Signed)
History     CSN: [id number]  Arrival date & time 04/15/11  1347   None     Chief Complaint  Patient presents with  . Ear Fullness  . Nasal Congestion    (Consider location/radiation/quality/duration/timing/severity/associated sxs/prior treatment) HPI Comments: Pt states he began with nasal congestion and cough a couple of weeks ago while in [place] for Christmas. He was seen in an ED for symptoms and diagnosed with bronchitis. He was prescribed Amoxicillin and cough symptoms improved. He continues to have yellow nasal mucus. In the last couple of days he has developed ear pressure and decreased hearing. He put mineral oil in his ears today without improvement. No fever or chills.    Past Medical History  Diagnosis Date  . Arthritis   . Hypercholesteremia   . GERD (gastroesophageal reflux disease)   . Glaucoma   . Hyperthyroidism     s/p RAI    Past Surgical History  Procedure Date  . Septoplasty 1970  . Cardiac catheterization 07/2006    Dr. Juanda Chance, negative  . Vasectomy   . Rai ablation 02/2008    Family History  Problem Relation Age of Onset  . Osteoporosis Mother   . Hypertension Sister   . Hypertension Brother   . Hypertension Brother   . Diabetes Brother   . Prostate cancer Brother   . Stomach cancer Maternal Grandmother   . Cancer Brother     CBD  . Heart attack Maternal Grandfather 62  .       History  Substance Use Topics  . Smoking status: Not on file  . Smokeless tobacco: Not on file  . Alcohol Use: Yes     socially      Review of Systems  Constitutional: Negative for fever and chills.  HENT: Positive for hearing loss (chronic, but has worsened with ear pressure), congestion and rhinorrhea. Negative for ear pain, sore throat and ear discharge.   Respiratory: Negative for cough, shortness of breath and wheezing.   Cardiovascular: Negative for chest pain.    Allergies  Garlic and Niacin  Home Medications   Current Outpatient Rx    Name Route Sig Dispense Refill  . ASPIRIN 81 MG PO TABS Oral Take 81 mg by mouth daily.      Marland Kitchen COENZYME Q10 300 MG PO CAPS Oral Take by mouth daily.      . OMEGA-3 FATTY ACIDS 1000 MG PO CAPS Oral Take 2 g by mouth daily.      Marland Kitchen FLAX SEED OIL 1000 MG PO CAPS Oral Take by mouth daily.      Marland Kitchen LEVOTHYROXINE SODIUM 100 MCG PO TABS Oral Take 100 mcg by mouth daily.      . MULTIVITAMIN PO Oral Take by mouth daily.      Marland Kitchen PRESCRIPTION MEDICATION  Eye drop for glucoma    . SIMVASTATIN 20 MG PO TABS Oral Take 20 mg by mouth every evening.    . TRAVOPROST 0.004 % OP SOLN  1 drop as directed.      . CHOLECALCIFEROL 2000 UNITS PO CAPS Oral Take by mouth daily.      Marland Kitchen LEVOFLOXACIN 500 MG PO TABS Oral Take 1 tablet (500 mg total) by mouth daily. 7 tablet 0  . ROSUVASTATIN CALCIUM 20 MG PO TABS Oral Take 20 mg by mouth daily.        BP 161/91  Pulse 56  Temp(Src) 97.6 F (36.4 C) (Oral)  Resp 20  SpO2 98%  Physical  Exam  Nursing note and vitals reviewed. Constitutional: He appears well-developed and well-nourished. No distress.  HENT:  Head: Normocephalic and atraumatic.  Right Ear: Tympanic membrane, external ear and ear canal normal.  Left Ear: Tympanic membrane, external ear and ear canal normal.  Nose: Nose normal.  Mouth/Throat: Uvula is midline, oropharynx is clear and moist and mucous membranes are normal. No oropharyngeal exudate, posterior oropharyngeal edema or posterior oropharyngeal erythema.       Lt septum erythematous and appears inflamed at Kiesselbach's plexus area without bleeding or visible vessels.   Neck: Neck supple. No thyromegaly present.  Cardiovascular: Normal rate, regular rhythm and normal heart sounds.   Pulmonary/Chest: Effort normal and breath sounds normal. No respiratory distress.  Lymphadenopathy:    He has no cervical adenopathy.  Neurological: He is alert.  Skin: Skin is warm and dry.  Psychiatric: He has a normal mood and affect.    ED Course   Procedures (including critical care time)  Labs Reviewed - No data to display No results found.   1. Acute sinusitis       MDM  Pt declined DepoMedrol injection today. He has Zyrtec at home that he has taken in past w/o side effects - discussed restarting.         Melody Comas, [place] 04/15/11 1654

## 2011-04-15 NOTE — ED Notes (Signed)
Pt with intermittent sinus congestion - now with decreased hearing right ear onset this am - per pt left ear beginning to feel the same

## 2011-04-15 NOTE — ED Provider Notes (Signed)
Medical screening examination/treatment/procedure(s) were performed by non-physician practitioner and as supervising physician I was immediately available for consultation/collaboration.  Hillery Hunter, MD 04/15/11 [phone]

## 2011-04-19 ENCOUNTER — Ambulatory Visit (INDEPENDENT_AMBULATORY_CARE_PROVIDER_SITE_OTHER): Payer: MEDICARE | Admitting: Family Medicine

## 2011-04-19 ENCOUNTER — Encounter: Payer: Self-pay | Admitting: Family Medicine

## 2011-04-19 DIAGNOSIS — H9190 Unspecified hearing loss, unspecified ear: Secondary | ICD-10-CM

## 2011-04-19 DIAGNOSIS — Z0110 Encounter for examination of ears and hearing without abnormal findings: Secondary | ICD-10-CM

## 2011-04-19 MED ORDER — AZELASTINE-FLUTICASONE 137-50 MCG/ACT NA SUSP: 1.0000 | Freq: Two times a day (BID) | NASAL | Status: DC

## 2011-04-19 NOTE — Progress Notes (Signed)
  Subjective:    Patient ID: William Williamson, male    DOB: Nov 16, 1932, 78 y.o.   MRN: [id number]  HPI Pt here c/o sudden hearing loss after christmas.  He was put on abx for bronchitis and woke up with almost no hearing in R ear and decreased hearing in L ear.  R ear fees full he says.  No more congestion or cough.  He is taking 1 1/2 zyrtec daily for nasal drainage.   Review of Systems as above   Objective:   Physical Exam  Constitutional: He is oriented to person, place, and time. He appears well-developed and well-nourished.  HENT:  Right Ear: External ear normal.  Left Ear: External ear normal.  Mouth/Throat: Oropharynx is clear and moist.  Pulmonary/Chest: Effort normal and breath sounds normal.  Neurological: He is alert and oriented to person, place, and time.  Psychiatric: He has a normal mood and affect. His behavior is normal. Judgment and thought content normal.          Assessment & Plan:  Hearing loss---- ? From fluid in ears--- con't zyrtec but only 1 a day                               Use dymista 1 spray each nostril bid                                Refer to ENT

## 2011-04-19 NOTE — Patient Instructions (Signed)
Hearing Loss A hearing loss is sometimes called deafness. Hearing loss may be partial or total. CAUSES Hearing loss may be caused by:  Wax in the ear canal.   Infection of the ear canal.   Infection of the middle ear.   Trauma to the ear or surrounding area.   Fluid in the middle ear.   A hole in the eardrum (perforated eardrum).   Exposure to loud sounds or music.   Problems with the hearing nerve.   Certain medications.  Hearing loss without wax, infection, or a history of injury may mean that the nerve is involved. Hearing loss with severe dizziness, nausea and vomiting or ringing in the ear may suggest a hearing nerve irritation or problems in the middle or inner ear. If hearing loss is untreated, there is a greater likelihood for residual or permanent hearing loss. DIAGNOSIS A hearing test (audiometry) assesses hearing loss. The audiometry test needs to be performed by a hearing specialist (audiologist). TREATMENT Treatment for recent onset of hearing loss may include:  Ear wax removal.   Medications that kill germs (antibiotics).   Cortisone medications.   Prompt follow up with the appropriate specialist.  Return of hearing depends on the cause of your hearing loss, so proper medical follow-up is important. Some hearing loss may not be reversible, and a caregiver should discuss care and treatment options with you. SEEK MEDICAL CARE IF:   You have a severe headache, dizziness, or changes in vision.   You have new or increased weakness.   You develop repeated vomiting or other serious medical problems.   You have a fever.  Document Released: 03/19/2005 Document Revised: 11/29/2010 Document Reviewed: 07/14/2009 [employer] Patient Information 2012 [employer]. 

## 2011-05-04 ENCOUNTER — Other Ambulatory Visit: Payer: Self-pay | Admitting: *Deleted

## 2011-05-04 MED ORDER — LEVOTHYROXINE SODIUM 100 MCG PO TABS: 100.0000 ug | ORAL_TABLET | Freq: Every day | ORAL | Status: DC

## 2011-05-04 MED ORDER — SIMVASTATIN 20 MG PO TABS: 20.0000 mg | ORAL_TABLET | Freq: Every evening | ORAL | Status: DC

## 2011-05-04 NOTE — Telephone Encounter (Signed)
Rx sent 

## 2011-06-07 ENCOUNTER — Encounter: Payer: Self-pay | Admitting: Internal Medicine

## 2011-06-14 ENCOUNTER — Ambulatory Visit (INDEPENDENT_AMBULATORY_CARE_PROVIDER_SITE_OTHER): Payer: Medicare Other | Admitting: Internal Medicine

## 2011-06-14 ENCOUNTER — Encounter: Payer: Self-pay | Admitting: Internal Medicine

## 2011-06-14 VITALS — BP 128/80 | HR 51 | Temp 97.4°F | Resp 14 | Ht 73.5 in | Wt 188.8 lb

## 2011-06-14 DIAGNOSIS — E559 Vitamin D deficiency, unspecified: Secondary | ICD-10-CM

## 2011-06-14 DIAGNOSIS — E039 Hypothyroidism, unspecified: Secondary | ICD-10-CM

## 2011-06-14 DIAGNOSIS — Z0000 Encounter for general adult medical examination without abnormal findings: Secondary | ICD-10-CM

## 2011-06-14 DIAGNOSIS — K219 Gastro-esophageal reflux disease without esophagitis: Secondary | ICD-10-CM

## 2011-06-14 DIAGNOSIS — E7800 Pure hypercholesterolemia, unspecified: Secondary | ICD-10-CM

## 2011-06-14 DIAGNOSIS — D649 Anemia, unspecified: Secondary | ICD-10-CM

## 2011-06-14 DIAGNOSIS — R9431 Abnormal electrocardiogram [ECG] [EKG]: Secondary | ICD-10-CM

## 2011-06-14 LAB — CBC WITH DIFFERENTIAL/PLATELET
Basophils Relative: 0.2 % (ref 0.0–3.0)
Eosinophils Absolute: 0 10*3/uL (ref 0.0–0.7)
Hemoglobin: 13.8 g/dL (ref 13.0–17.0)
Lymphs Abs: 1.2 10*3/uL (ref 0.7–4.0)
MCHC: 33.2 g/dL (ref 30.0–36.0)
MCV: 96.6 fl (ref 78.0–100.0)
Monocytes Absolute: 0.4 10*3/uL (ref 0.1–1.0)
Neutro Abs: 3.2 10*3/uL (ref 1.4–7.7)
RBC: 4.3 Mil/uL (ref 4.22–5.81)

## 2011-06-14 LAB — HEPATIC FUNCTION PANEL
Albumin: 4.5 g/dL (ref 3.5–5.2)
Total Protein: 7 g/dL (ref 6.0–8.3)

## 2011-06-14 LAB — BASIC METABOLIC PANEL
CO2: 31 mEq/L (ref 19–32)
Chloride: 103 mEq/L (ref 96–112)
Creatinine, Ser: 1 mg/dL (ref 0.4–1.5)
Sodium: 139 mEq/L (ref 135–145)

## 2011-06-14 LAB — LIPID PANEL
HDL: 44.4 mg/dL (ref >39.00)
Triglycerides: 71 mg/dL (ref 0.0–149.0)

## 2011-06-14 NOTE — Patient Instructions (Signed)
Preventive Health Care: Exercise at least 30-45 minutes a day,  3-4 days a week.  Eat a low-fat diet with lots of fruits and vegetables, up to 7-9 servings per day. Consume less than 40 grams of sugar per day from foods & drinks with High Fructose Corn Sugar as # 1,2,3 or # 4 on label. Health Care Power of Attorney & Living Will. Complete if not in place ; these place you in charge of your health care decisions. 

## 2011-06-14 NOTE — Progress Notes (Signed)
Subjective:    Patient ID: William Williamson, male    DOB: 1932-04-24, 78 y.o.   MRN: [id number]  HPI Medicare Wellness Visit:  The following psychosocial & medical history were reviewed as required by Medicare.   Social history: caffeine: 2 cups/ day , alcohol:  6 beers / week ,  tobacco use : quit 1970 & exercise : walking 3 mpd daily.   Home & personal  safety / fall risk: no issues ( see below), activities of daily living: no limitations , seatbelt use : yes , and smoke alarm employment : yes .  Power of Attorney/Living Will status :no Living Will  Vision ( as recorded per Nurse) & Hearing  evaluation :  See exam. Orientation :oriented X 3 , memory & recall : good,  math testing: good,and mood & affect : normal . Depression / anxiety: denied Travel history : 21 [place] , immunization status :up to date , transfusion history: no, and preventive health surveillance ( colonoscopies, BMD , etc as per protocol/ [facility]): colonoscopy ? Up to date, Dental care:  Every 6 mos . Chart reviewed &  Updated. Active issues reviewed & addressed.       Review of Systems  He does have some lightheadedness climbing up an incline and with high level of exercise in his yard. He specifically denies chest pain, palpitations, dyspnea, nausea or associated diaphoresis. See his  level of exercise as noted above     Objective:   Physical Exam Gen.: thin but healthy and well-nourished in appearance. Alert, appropriate and cooperative throughout exam.Appears younger than stated age  Head: Normocephalic without obvious abnormalities; pattern alopecia  Eyes: No corneal or conjunctival inflammation noted. Pupils unequal ,OS > OD. Extraocular motion intact. Vision grossly normal. Ears: External  ear exam reveals no significant lesions or deformities. Canals clear .TMs normal. Hearing is grossly decreased bilaterally. Nose: External nasal exam reveals no deformity or inflammation. Nasal mucosa are pink and moist. No  lesions or exudates noted. Septum slightly dislocated to R Mouth: Oral mucosa and oropharynx reveal no lesions or exudates. Teeth in good repair. Neck: No deformities, masses, or tenderness noted. Range of motion & Thyroid normal Lungs: Normal respiratory effort; chest expands symmetrically. Lungs are clear to auscultation without rales, wheezes, or increased work of breathing. Heart: SLOW rate,normal rhythm. Normal S1 and S2. No gallop, click, or rub. No murmur. Abdomen: Bowel sounds normal; abdomen soft and nontender. No masses, organomegaly or hernias noted. Genitalia/ DRE: Genital and rectal/prostate  exam are normal with the exception of  small varices on the left                                                         Musculoskeletal/extremities: No deformity or scoliosis noted of  the thoracic or lumbar spine. No clubbing, cyanosis, edema, or deformity noted. Range of motion  normal .Tone & strength  normal.Joints normal. Nail health  good. Vascular: Carotid, radial artery, dorsalis pedis and  posterior tibial pulses are full and equal. No bruits present. Neurologic: Alert and oriented x3. Deep tendon reflexes symmetrical and normal.Decreased facial expression but no cranial nerve deficit except iatrogenic anisocoria.          Skin: Intact without suspicious lesions or rashes. Lymph: No cervical, axillary, or inguinal lymphadenopathy present. Psych: Mood and  affect are normal. Normally interactive                                                                                         Assessment & Plan:  #1 Medicare Wellness Exam; criteria met ; data entered #2 Problem List reviewed ; Assessment/ Recommendations made Plan: see Orders

## 2011-06-15 LAB — VITAMIN D 25 HYDROXY (VIT D DEFICIENCY, FRACTURES): Vit D, 25-Hydroxy: 35 ng/mL (ref 30–89)

## 2011-08-07 ENCOUNTER — Telehealth: Payer: Self-pay | Admitting: Internal Medicine

## 2011-08-07 NOTE — Telephone Encounter (Signed)
Refill: Simvastatin 20mg  tab. Take 1 by mouth every evening. 90 day supply Levothyroxin tab. Take 1 by mouth daily. 90 day supply

## 2011-08-08 MED ORDER — LEVOTHYROXINE SODIUM 100 MCG PO TABS: 100.0000 ug | ORAL_TABLET | Freq: Every day | ORAL | Status: DC

## 2011-08-08 MED ORDER — SIMVASTATIN 20 MG PO TABS: 20.0000 mg | ORAL_TABLET | Freq: Every evening | ORAL | Status: DC

## 2011-08-08 NOTE — Telephone Encounter (Signed)
RXs sent.

## 2012-01-15 ENCOUNTER — Ambulatory Visit (INDEPENDENT_AMBULATORY_CARE_PROVIDER_SITE_OTHER): Payer: Medicare Other

## 2012-01-15 DIAGNOSIS — Z23 Encounter for immunization: Secondary | ICD-10-CM

## 2012-05-19 ENCOUNTER — Telehealth: Payer: Self-pay | Admitting: Internal Medicine

## 2012-05-19 NOTE — Telephone Encounter (Signed)
Patient Information:  Caller Name: Hernandez  Phone: [phone]  Patient: William Williamson, William Williamson  Gender: Male  DOB: 01/14/1933  Age: 79 Years  PCP: Marga Melnick  Office Follow Up:  Does the office need to follow up with this patient?: No  Instructions For The Office: N/A   Symptoms  Reason For Call & Symptoms: pt reports that since Christmas he has been checking his blood pressure and it is running an average of 145/85.  Reviewed Health History In EMR: Yes  Reviewed Medications In EMR: Yes  Reviewed Allergies In EMR: Yes  Reviewed Surgeries / Procedures: Yes  Date of Onset of Symptoms: 05/05/2012  Guideline(s) Used:  High Blood Pressure  Disposition Per Guideline:   See Within 2 Weeks in Office  Reason For Disposition Reached:   BP > 140/90 and is not taking BP medications  Advice Given:  Lifestyle Changes  Do 30 minutes of aerobic physical activity (e.g., brisk walking) most days of the week.  Eat a diet high in fresh fruits and low-fat dairy products. Limit your intake of saturated and total fat. Choose foods that are lower in salt.  If you smoke, you should stop.  If you drink alcohol, you should limit your daily alcohol drinking. Women should have no more than one drink per day. Men should have no more than 2 drinks per day. A drink is defined as 1.5 oz hard liquor (one shot or jigger; 45 ml), 5 oz wine (small glass; 150 ml), or 12 oz beer (one can; 360 ml).  Call Back If:  Headache, blurred vision, difficulty talking, or difficulty walking occurs  Chest pain or difficulty breathing occurs  You become worse.  Appointment Scheduled:  05/20/2012 10:00:00 Appointment Scheduled Provider:  Marga Melnick  (no appt found for today)

## 2012-05-20 ENCOUNTER — Encounter: Payer: Self-pay | Admitting: Internal Medicine

## 2012-05-20 ENCOUNTER — Ambulatory Visit (INDEPENDENT_AMBULATORY_CARE_PROVIDER_SITE_OTHER): Payer: Medicare Other | Admitting: Internal Medicine

## 2012-05-20 VITALS — BP 150/88 | HR 72 | Temp 98.1°F | Wt 193.6 lb

## 2012-05-20 DIAGNOSIS — R030 Elevated blood-pressure reading, without diagnosis of hypertension: Secondary | ICD-10-CM

## 2012-05-20 LAB — BASIC METABOLIC PANEL
BUN: 14 mg/dL (ref 6–23)
Chloride: 101 mEq/L (ref 96–112)
Glucose, Bld: 74 mg/dL (ref 70–99)
Potassium: 4.8 mEq/L (ref 3.5–5.1)

## 2012-05-20 MED ORDER — RAMIPRIL 5 MG PO CAPS: 5.0000 mg | ORAL_CAPSULE | Freq: Every day | ORAL | Status: DC

## 2012-05-20 NOTE — Patient Instructions (Addendum)
Minimal Blood Pressure Goal= AVERAGE < 140/90;  Ideal is an AVERAGE < 135/85. This AVERAGE should be calculated from @ least 5-7 BP readings taken @ different times of day on different days of week. You should not respond to isolated BP readings , but rather the AVERAGE for that week .Please bring your  blood pressure cuff to office visits to verify that it is reliable.It  can also be checked against the blood pressure device at the pharmacy. Finger or wrist cuffs are not dependable; an arm cuff is.  Report any significant cough or swelling of the lips or tongue with the new blood pressure pill. These are not expected but there are rare occurrences. Stop the medicine immediately should either occur.  If you activate the  My Chart system; lab & Xray results will be released directly  to you as soon as I review & address these through the computer. As per [facility] all records must be reviewed and signed off within 72 hours; but I attempt to complete this within 36 hours unless I have no access to the electronic medical record.If I wait more than 36 hours the volume of reports becomes difficult to manage optimally. In my absence ;my partners would address the results.Critical lab results will be communicated to you ASAP. If you choose not to sign up for My Chart within 36 hours of labs being drawn; results will be reviewed & interpretation added before being copied & mailed, causing a delay in getting the results to you.If you do not receive that report within 7-10 days ,please call. Additionally you can use this system to gain direct  access to your records  if  out of town or @ an office of a  physician who is not in  the My Chart network.  This improves continuity of care & places you in control of your medical record.

## 2012-05-20 NOTE — Progress Notes (Signed)
  Subjective:    Patient ID: William Williamson, male    DOB: 07-12-1932, 79 y.o.   MRN: [id number]  HPI  He noted positionally related sounds described as cat purring in the  left ear 3 weeks ago intermittently which prompted him to begin monitoring his blood pressure. This is worse with neck flexion and improved with neck extension.  Home blood pressure range 120/62-164/90  Patient is not on  BP medications  Exercise program as walking 3 mpd alternating with weight bearing for 25-55 minutes  On heart healthy,  low  low-salt diet     Review of Systems No chest pain, palpitations, dyspnea, claudication,edema or paroxysmal nocturnal dyspnea described   No significant lightheadedness,significant  headache, epistaxis, or syncope     Objective:   Physical Exam Appears thin but healthy and well-nourished & in no acute distress  Hearing aids ; only R worn today  No carotid bruits are present.No neck pain distention present at 10 - 15 degrees. Thyroid normal to palpation  Heart rhythm and rate are normal ;  Grade 1/2- 1 over 6 systolic murmur w/o  gallop.  Chest is clear with no increased work of breathing  There is no evidence of aortic aneurysm or renal artery bruits  Abdomen soft with no organomegaly or masses. No HJR  No clubbing, cyanosis or edema present.  Pedal pulses are intact   No ischemic skin changes are present . Nails healthy   Alert and oriented. Strength, tone, DTRs reflexes normal         Assessment & Plan:   #1 elevated blood pressure without prior evidence or diagnosis of hypertension.  #2 subjective abnormal sounds; rule out tinnitus versus positionally compression of carotid. No carotid bruits present  Plan: See orders and recommendations.

## 2012-06-13 ENCOUNTER — Encounter: Payer: Self-pay | Admitting: Internal Medicine

## 2012-06-13 ENCOUNTER — Ambulatory Visit (INDEPENDENT_AMBULATORY_CARE_PROVIDER_SITE_OTHER): Payer: Medicare Other | Admitting: Internal Medicine

## 2012-06-13 VITALS — Temp 97.7°F | Resp 12 | Ht 74.0 in | Wt 190.0 lb

## 2012-06-13 DIAGNOSIS — R030 Elevated blood-pressure reading, without diagnosis of hypertension: Secondary | ICD-10-CM

## 2012-06-13 DIAGNOSIS — E559 Vitamin D deficiency, unspecified: Secondary | ICD-10-CM

## 2012-06-13 DIAGNOSIS — E785 Hyperlipidemia, unspecified: Secondary | ICD-10-CM

## 2012-06-13 DIAGNOSIS — Z862 Personal history of diseases of the blood and blood-forming organs and certain disorders involving the immune mechanism: Secondary | ICD-10-CM

## 2012-06-13 DIAGNOSIS — E890 Postprocedural hypothyroidism: Secondary | ICD-10-CM

## 2012-06-13 DIAGNOSIS — Z0000 Encounter for general adult medical examination without abnormal findings: Secondary | ICD-10-CM

## 2012-06-13 DIAGNOSIS — I10 Essential (primary) hypertension: Secondary | ICD-10-CM

## 2012-06-13 LAB — HEPATIC FUNCTION PANEL
ALT: 20 U/L (ref 0–53)
Albumin: 4.6 g/dL (ref 3.5–5.2)
Total Bilirubin: 0.8 mg/dL (ref 0.3–1.2)
Total Protein: 7.4 g/dL (ref 6.0–8.3)

## 2012-06-13 LAB — LIPID PANEL
Cholesterol: 142 mg/dL (ref 0–200)
LDL Cholesterol: 85 mg/dL (ref 0–99)
Triglycerides: 68 mg/dL (ref 0.0–149.0)

## 2012-06-13 LAB — CBC WITH DIFFERENTIAL/PLATELET
Eosinophils Relative: 1.7 % (ref 0.0–5.0)
HCT: 43.3 % (ref 39.0–52.0)
Hemoglobin: 14.9 g/dL (ref 13.0–17.0)
Lymphs Abs: 1.4 10*3/uL (ref 0.7–4.0)
MCV: 93.8 fl (ref 78.0–100.0)
Monocytes Absolute: 0.4 10*3/uL (ref 0.1–1.0)
Neutro Abs: 4.1 10*3/uL (ref 1.4–7.7)
Platelets: 223 10*3/uL (ref 150.0–400.0)
WBC: 6.1 10*3/uL (ref 4.5–10.5)

## 2012-06-13 MED ORDER — SIMVASTATIN 20 MG PO TABS: 20.0000 mg | ORAL_TABLET | Freq: Every evening | ORAL | Status: DC

## 2012-06-13 MED ORDER — PNEUMOCOCCAL VAC POLYVALENT 25 MCG/0.5ML IJ INJ: 0.5000 mL | INJECTION | Freq: Once | INTRAMUSCULAR | Status: DC

## 2012-06-13 MED ORDER — RAMIPRIL 5 MG PO CAPS: 5.0000 mg | ORAL_CAPSULE | Freq: Every day | ORAL | Status: DC

## 2012-06-13 NOTE — Patient Instructions (Addendum)

## 2012-06-13 NOTE — Progress Notes (Signed)
Subjective:    Patient ID: William Williamson, male    DOB: 06-Jun-1932, 79 y.o.   MRN: [id number]  HPI Medicare Wellness Visit:  Psychosocial & medical history were reviewed as required by Medicare (abuse,antisocial behavioral risks,firearm risk).  Social history: caffeine:2 cups / day  , alcohol: 6 beers / week  ,  tobacco use: quit 1970 Exercise :  Walks 3 mpd daily No home & personal  safety / fall risk Activities of daily living: no limitations  Seatbelt  and smoke alarm employed. Power of Attorney/Living Will status : no Living Will Ophthalmology exam current Hearing evaluation not current Orientation :oriented X 3  Memory & recall :good Math testing:good Mood & affect : normal . Depression / anxiety: denied Travel history : last  1996 [place]  Immunization status :? PNA  needed Transfusion history:  none  Preventive health surveillance ( colonoscopy as per protocol/ [facility]):  ? Current Dental care:  Every 12 mos. Chart reviewed &  Updated. Active issues reviewed & addressed.      Review of Systems HYPERTENSION: Disease Monitoring: Blood pressure range 108/67- 160/86 Compliant with present antihypertensive regimen   HYPERLIPIDEMIA: Diet ; heart healthy;exercise as noted Medication Compliance:  Statin taken  No FH premature CAD / CVA   Past medical history/family history/social history were all reviewed and updated.    Signs & symptoms not present or negative as noted below: No significant headaches No chest pain, palpitations , claudications, PNDyspnea    No exertional dyspnea No lightheadedness or syncope   No edema No blurred , double or loss of vision No abd pain. Chronic constipation No significant myalgias                                                                                               Objective:   Physical Exam Gen.: Healthy and well-nourished in appearance. Alert, appropriate and cooperative throughout exam. Appears younger  than stated age  Head: Normocephalic without obvious abnormalities;  pattern alopecia  Eyes: No corneal or conjunctival inflammation noted. Pupils equal round reactive to light and accommodation.  Extraocular motion intact. Vision grossly normal with lenses Ears: External  ear exam reveals no significant lesions or deformities. Canals clear .TMs normal. Hearing is grossly decreased bilaterally. Nose: External nasal exam reveals no deformity or inflammation. Nasal mucosa are pink and moist. No lesions or exudates noted. Septum  Dislocated to R &  Deviated to L Mouth: Oral mucosa and oropharynx reveal no lesions or exudates. Teeth in good repair. Neck: No deformities, masses, or tenderness noted. Range of motion & Thyroid  normal. Lungs: Normal respiratory effort; chest expands symmetrically. Lungs are clear to auscultation without rales, wheezes, or increased work of breathing. Heart: Normal rate and rhythm. Normal S1 and S2. No gallop, click, or rub. Grade 1/6 systolic murmur Abdomen: Bowel sounds normal; abdomen soft and nontender. No masses, organomegaly or hernias noted. Genitalia: Genitalia normal except for left varices. Prostate is normal without enlargement, asymmetry, nodularity, or induration.  Musculoskeletal/extremities: No deformity or scoliosis noted of  the thoracic or lumbar spine.  No clubbing, cyanosis, edema, or significant extremity  deformity noted. Range of motion normal .Tone & strength  Normal. Joints normal. Nail health good. Able to lie down & sit up w/o help. Negative SLR bilaterally Vascular: Carotid, radial artery, dorsalis pedis and  posterior tibial pulses are full and equal. No bruits present. Neurologic: Alert and oriented x3. Deep tendon reflexes symmetrical and normal.  Gait normal .        Skin: Intact without suspicious lesions or rashes. Lymph: No cervical, axillary, or inguinal lymphadenopathy present. Psych: Mood and affect  are normal. Normally interactive                                                                                       Assessment & Plan:   #1 Medicare Wellness Exam; criteria met ; data entered #2 Problem List reviewed ; Assessment/ Recommendations made Plan: see Orders

## 2012-06-19 LAB — VITAMIN D 1,25 DIHYDROXY
Vitamin D 1, 25 (OH)2 Total: 58 pg/mL (ref 18–72)
Vitamin D2 1, 25 (OH)2: <8 pg/mL
Vitamin D3 1, 25 (OH)2: 58 pg/mL

## 2012-09-09 ENCOUNTER — Other Ambulatory Visit: Payer: Self-pay | Admitting: General Practice

## 2012-09-09 MED ORDER — LEVOTHYROXINE SODIUM 100 MCG PO TABS: 100.0000 ug | ORAL_TABLET | Freq: Every day | ORAL | Status: DC

## 2012-09-09 NOTE — Telephone Encounter (Signed)
Med filled.  

## 2012-11-05 ENCOUNTER — Other Ambulatory Visit: Payer: Self-pay

## 2013-01-15 ENCOUNTER — Encounter: Payer: Self-pay | Admitting: Internal Medicine

## 2013-01-15 ENCOUNTER — Ambulatory Visit (INDEPENDENT_AMBULATORY_CARE_PROVIDER_SITE_OTHER): Payer: Medicare Other | Admitting: Internal Medicine

## 2013-01-15 VITALS — BP 177/94 | HR 58 | Temp 98.1°F | Wt 187.4 lb

## 2013-01-15 DIAGNOSIS — W57XXXA Bitten or stung by nonvenomous insect and other nonvenomous arthropods, initial encounter: Secondary | ICD-10-CM

## 2013-01-15 DIAGNOSIS — R29898 Other symptoms and signs involving the musculoskeletal system: Secondary | ICD-10-CM

## 2013-01-15 DIAGNOSIS — R51 Headache: Secondary | ICD-10-CM

## 2013-01-15 DIAGNOSIS — R5381 Other malaise: Secondary | ICD-10-CM

## 2013-01-15 DIAGNOSIS — T148 Other injury of unspecified body region: Secondary | ICD-10-CM

## 2013-01-15 LAB — CBC WITH DIFFERENTIAL/PLATELET
Basophils Absolute: 0 10*3/uL (ref 0.0–0.1)
Eosinophils Relative: 1.7 % (ref 0.0–5.0)
Lymphocytes Relative: 26 % (ref 12.0–46.0)
Monocytes Relative: 7.2 % (ref 3.0–12.0)
Neutrophils Relative %: 64.8 % (ref 43.0–77.0)
Platelets: 213 10*3/uL (ref 150.0–400.0)
RDW: 12.9 % (ref 11.5–14.6)
WBC: 6.9 10*3/uL (ref 4.5–10.5)

## 2013-01-15 LAB — TSH: TSH: 2.16 u[IU]/mL (ref 0.35–5.50)

## 2013-01-15 LAB — SEDIMENTATION RATE: Sed Rate: 6 mm/hr (ref 0–22)

## 2013-01-15 NOTE — Patient Instructions (Signed)

## 2013-01-15 NOTE — Progress Notes (Signed)
Subjective:    Patient ID: William Williamson, male    DOB: 1932/11/04, 79 y.o.   MRN: [id number]  HPI   He describes frontal lobe pressure type discomfort for the last 2 months in the context of having been bitten by 6-7 ticks. He feels that in the symptoms have progressed. He's also had some pressure in his ears.  He describes involuntary movement of the left hand but not actual tremor and generalized malaise. He feels that the left arm feels weak. He describes some anxiety.      Review of Systems  He denies blurred vision, double vision, or loss of vision.  He denies fever, chills, sweats, weight loss, or rash. There's been no associated rash or change in color of the skin initially after the tick bite.  He also denies facial pain, nasal purulence, dental pain, or otic discharge.  There's been no change in his hearing; he has hearing aid on the right. There's been no tinnitus.  There is no specific limb numbness and tingling.  He denies incontinence of urine or stool     Objective:   Physical Exam Gen.: Thin but  well-nourished in appearance. Alert, appropriate and cooperative throughout exam. Flat affect with minimal facial expression.Appears younger than stated age  Head: Normocephalic without obvious abnormalities; pattern alopecia  Eyes: No corneal or conjunctival inflammation noted. Pupils equal round reactive to light and accommodation. FOV normal. Extraocular motion intact. Vision grossly normal with lenses Ears: External  ear exam reveals no significant lesions or deformities.  L Canals clear & TM normal. Hearing  Aid on R. Nose: External nasal exam reveals no deformity or inflammation. Nasal mucosa are pink and moist. No lesions or exudates noted. Septum deviated. Mouth: Oral mucosa and oropharynx reveal no lesions or exudates. Teeth in good repair. Neck: No deformities, masses, or tenderness noted. Range of motion decreased.No meningismus Lungs: Normal respiratory  effort; chest expands symmetrically. Lungs are clear to auscultation without rales, wheezes, or increased work of breathing. Heart: Normal rate and rhythm. Normal S1 and S2. No gallop, click, or rub. Grade 1/6 systolic murmur. Abdomen: Bowel sounds normal; abdomen soft and nontender. No masses, organomegaly or hernias noted.                                  Musculoskeletal/extremities:No clubbing, cyanosis, edema, or significant extremity  deformity noted. Range of motion normal .Tone normal without cogwheeling & strength normal position. Joints normal. Nail health good. Able to lie down & sit up w/o help.  Vascular: Carotid, radial artery, dorsalis pedis and  posterior tibial pulses are full and equal. No bruits present. Neurologic: Oriented x3. Deep tendon reflexes symmetrical and normal.  Mask facies Gait normal  .  Romberg and finger to nose testing normal Skin: Intact without suspicious lesions or rashes. Lymph: No cervical, axillary lymphadenopathy present. Psych: Mood and affect are normal. Normally interactive                                                                                        Assessment & Plan:  #  1 frontal headache; sinus films will be performed  #2 subjective weakness left upper extremity, not documented & fatigue  #3 mask facies , R/O Parkinson's. Neurology referral if imaging & labs negative

## 2013-01-16 LAB — ROCKY MTN SPOTTED FVR AB, IGM-BLOOD: ROCKY MTN SPOTTED FEVER, IGM: 0.17 IV

## 2013-01-16 LAB — B. BURGDORFI ANTIBODIES: B burgdorferi Ab IgG+IgM: 0.7 {ISR}

## 2013-01-19 ENCOUNTER — Ambulatory Visit
Admission: RE | Admit: 2013-01-19 | Discharge: 2013-01-19 | Disposition: A | Discharge status: Home / Self Care | Payer: Medicare Other | Source: Ambulatory Visit | Attending: Internal Medicine | Admitting: Internal Medicine

## 2013-01-19 DIAGNOSIS — R51 Headache: Secondary | ICD-10-CM

## 2013-01-20 ENCOUNTER — Encounter: Payer: Self-pay | Admitting: Internal Medicine

## 2013-01-21 ENCOUNTER — Other Ambulatory Visit: Payer: Self-pay | Admitting: Internal Medicine

## 2013-01-21 DIAGNOSIS — R51 Headache: Secondary | ICD-10-CM

## 2013-01-22 ENCOUNTER — Ambulatory Visit (INDEPENDENT_AMBULATORY_CARE_PROVIDER_SITE_OTHER): Payer: Medicare Other | Admitting: General Practice

## 2013-01-22 DIAGNOSIS — Z23 Encounter for immunization: Secondary | ICD-10-CM

## 2013-02-03 ENCOUNTER — Encounter: Payer: Self-pay | Admitting: Neurology

## 2013-02-03 ENCOUNTER — Ambulatory Visit (INDEPENDENT_AMBULATORY_CARE_PROVIDER_SITE_OTHER): Payer: Medicare Other | Admitting: Neurology

## 2013-02-03 VITALS — BP 142/88 | HR 70 | Temp 97.8°F | Resp 14 | Ht 73.0 in | Wt 191.5 lb

## 2013-02-03 DIAGNOSIS — G20 Parkinson's disease: Secondary | ICD-10-CM

## 2013-02-03 DIAGNOSIS — R251 Tremor, unspecified: Secondary | ICD-10-CM

## 2013-02-03 DIAGNOSIS — R259 Unspecified abnormal involuntary movements: Secondary | ICD-10-CM

## 2013-02-03 NOTE — Patient Instructions (Signed)
MRI Brain is scheduled for Friday November 14th at 11:45am at [facility], [address]

## 2013-02-03 NOTE — Progress Notes (Signed)
William Williamson was seen today in the movement disorders clinic for neurologic consultation at the request of Marga Melnick, MD.  The consultation is for the evaluation of left hand tremor and weakness.  The pt is 79 y.o. male R hand dominant.  He states that he got bit by many ticks in the summer and got headaches.  He then noted L hand tremor some time later (about 1 month ago).  The pt thought that it was lyme disease and he was told that he did not have lyme disease and did not have RMSF.  He notes L hand tremor most when sitting down.  He does not notice it when he is moving.  He has no tremor in the L leg or on the R side    Specific Symptoms:  Tremor: yes Voice: no changes Sleep: sleeps well  Vivid Dreams:  no  Acting out dreams:  no Wet Pillows: yes (started 6 months ago) Postural symptoms:  no  Falls?  no Bradykinesia symptoms: no bradykinesia noted Loss of smell:  no Loss of taste:  no Urinary Incontinence:  no but does have urinary urgency Difficulty Swallowing:  yes (after finishes eating solid, has to drink to "wash it down." Handwriting, micrographia: no Trouble with ADL's:  no  Trouble buttoning clothing: no Depression:  no Memory changes:  no Hallucinations:  no  visual distortions: no N/V:  no Lightheaded:  yes  (relates to high blood pressure medication and happens when walking up hill)  Syncope: no Diplopia:  no Dyskinesia:  no  Neuroimaging has not previously been performed.    PREVIOUS MEDICATIONS: none to date  ALLERGIES:   Allergies  Allergen Reactions  . Garlic     nausea  . Niacin     REACTION: flushing    CURRENT MEDICATIONS:  Current Outpatient Prescriptions on File Prior to Visit  Medication Sig Dispense Refill  . aspirin 81 MG tablet Take 81 mg by mouth daily.        . bimatoprost (LUMIGAN) 0.03 % ophthalmic solution Place 1 drop into both eyes at bedtime.      . Cholecalciferol 2000 UNITS CAPS Take by mouth daily.        . Coenzyme  Q10 300 MG CAPS Take by mouth daily.        . fish oil-omega-3 fatty acids 1000 MG capsule Take 2 g by mouth daily.        . Flaxseed, Linseed, (FLAX SEED OIL) 1000 MG CAPS Take by mouth daily.        Marland Kitchen levothyroxine (SYNTHROID, LEVOTHROID) 100 MCG tablet Take 1 tablet (100 mcg total) by mouth daily.  90 tablet  2  . Multiple Vitamin (MULTIVITAMIN PO) Take by mouth daily.        . pneumococcal 23 valent vaccine (PNU-IMMUNE) 25 MCG/0.5ML injection Inject 0.5 mLs into the muscle once.  0.5 mL  0  . simvastatin (ZOCOR) 20 MG tablet Take 1 tablet (20 mg total) by mouth every evening.  90 tablet  3   No current facility-administered medications on file prior to visit.    PAST MEDICAL HISTORY:   Past Medical History  Diagnosis Date  . Arthritis   . Hypercholesteremia   . GERD (gastroesophageal reflux disease)   . Glaucoma     Dr Hazle Quant  . Hyperthyroidism     s/p RAI  . Anemia 1952    post Oromycin for [place]  . Hearing loss in right ear   .  Anisocoria     post op    PAST SURGICAL HISTORY:   Past Surgical History  Procedure Laterality Date  . Septoplasty  1970  . Cardiac catheterization  07/2006    Dr.  Charlies Constable, negative  . Vasectomy    . Rai ablation  02/2008    hyperthyroidism  . Cataract extraction, bilateral      OS retinal surgery for post op floaters  . Colonoscopy      negative X 3    SOCIAL HISTORY:   History   Social History  . Marital Status: Widowed    Spouse Name: N/A    Number of Children: N/A  . Years of Education: N/A   Occupational History  . Not on file.   Social History Main Topics  . Smoking status: Former Smoker    Quit date: 04/02/1968  . Smokeless tobacco: Never Used  . Alcohol Use: 3.6 oz/week    6 Cans of beer per week     Comment: socially  . Drug Use: No  . Sexual Activity: Not on file   Other Topics Concern  . Not on file   Social History Narrative  . No narrative on file    FAMILY HISTORY:   Family Status  Relation  Status Death Age  . Father Deceased 32    natural causes    ROS:  A complete 10 system review of systems was obtained and was unremarkable apart from what is mentioned above.  PHYSICAL EXAMINATION:    VITALS:   Filed Vitals:   02/03/13 0925  BP: 142/88  Pulse: 70  Temp: 97.8 F (36.6 C)  Resp: 14  Height: 6\' 1"  (1.854 m)  Weight: 191 lb 8 oz (86.864 kg)    GEN:  The patient appears stated age and is in NAD. HEENT:  Normocephalic, atraumatic.  The mucous membranes are moist. The superficial temporal arteries are without ropiness or tenderness. CV:  RRR Lungs:  CTAB Neck/HEME:  There are no carotid bruits bilaterally.  Neurological examination:  Orientation: The patient is alert and oriented x3. Fund of knowledge is appropriate.  Recent and remote memory are intact.  Attention and concentration are normal.    Able to name objects and repeat phrases. Cranial nerves: There is good facial symmetry.  There is facial hypomimia.  Pupils are equal round and reactive to light bilaterally.  There are square wave jerks. Fundoscopic exam reveals clear margins bilaterally. Extraocular muscles are intact. The visual fields are full to confrontational testing. The speech is fluent and clear. Soft palate rises symmetrically and there is no tongue deviation. Hearing is intact to conversational tone. Sensation: Sensation is intact to light and pinprick throughout (facial, trunk, extremities). Vibration is decreased at the bilateral big toe. There is no extinction with double simultaneous stimulation. There is no sensory dermatomal level identified. Motor: Strength is 5/5 in the bilateral upper and lower extremities.   Shoulder shrug is equal and symmetric.  There is no pronator drift. Deep tendon reflexes: Deep tendon reflexes are 2/4 at the bilateral biceps, triceps, brachioradialis, patella and absent at the bilateral achilles. Plantar responses are downgoing bilaterally.  Movement  examination: Tone: There is minimal increased tone in the left upper extremity, only noted with activation.  Tone in the right upper and bilateral lower extremities is normal.  Abnormal movements: There is an intermittent left upper extremity resting tremor.  There is also a jaw tremor present. Coordination:  There is minimal decremation with RAM's, seen  only on the left with finger taps and hand opening and closing. Gait and Station: The patient has no difficulty arising out of a deep-seated chair without the use of the hands. The patient's stride length is fairly normal, with marked decrease in arm swing bilaterally.  The patient has a negative pull test.      ASSESSMENT/PLAN:  1.  Tremor  -I do suspect that this represents the onset of a very mild, early parkinsonism, likely Parkinson's disease.  Because the symptoms have been only going on for about a month, I am going to do an MRI of the brain.  -He and I talked about various treatments.  We talked about pathophysiology.  We talked about the importance of exercise.  We talked about various medications, but ultimately decided to hold off on starting any of those given the minimal symptoms and the fact that medication does not change progression of the disease.  We will readdress this next visit.  -Talked about safety.  Greater than 50% of the 60 minute visit was in counseling.  -I will see him back after his MRI has been completed.

## 2013-02-13 ENCOUNTER — Ambulatory Visit
Admission: RE | Admit: 2013-02-13 | Discharge: 2013-02-13 | Disposition: A | Discharge status: Home / Self Care | Payer: Medicare Other | Source: Ambulatory Visit | Attending: Neurology | Admitting: Neurology

## 2013-02-13 ENCOUNTER — Other Ambulatory Visit: Payer: Self-pay | Admitting: Neurology

## 2013-02-13 DIAGNOSIS — R251 Tremor, unspecified: Secondary | ICD-10-CM

## 2013-02-13 DIAGNOSIS — Z77018 Contact with and (suspected) exposure to other hazardous metals: Secondary | ICD-10-CM

## 2013-02-13 MED ORDER — GADOBENATE DIMEGLUMINE 529 MG/ML IV SOLN: 17.0000 mL | Freq: Once | INTRAVENOUS | Status: DC | PRN

## 2013-03-31 ENCOUNTER — Ambulatory Visit (INDEPENDENT_AMBULATORY_CARE_PROVIDER_SITE_OTHER): Payer: Medicare Other | Admitting: Neurology

## 2013-03-31 ENCOUNTER — Encounter: Payer: Self-pay | Admitting: Neurology

## 2013-03-31 VITALS — BP 120/82 | HR 76 | Temp 97.7°F | Resp 16 | Ht 73.0 in | Wt 194.0 lb

## 2013-03-31 DIAGNOSIS — G20 Parkinson's disease: Secondary | ICD-10-CM

## 2013-03-31 NOTE — Progress Notes (Signed)
William Williamson was seen today in the movement disorders clinic for neurologic consultation at the request of Marga Melnick, MD.  The consultation is for the evaluation of left hand tremor and weakness.  The pt is 79 y.o. male R hand dominant.  He states that he got bit by many ticks in the summer and got headaches.  He then noted L hand tremor some time later (about 1 month ago).  The pt thought that it was lyme disease and he was told that he did not have lyme disease and did not have RMSF.  He notes L hand tremor most when sitting down.  He does not notice it when he is moving.  He has no tremor in the L leg or on the R side   03/31/13 update:  The patient presents today for followup.  He was diagnosed with mild Parkinson's disease on 02/03/2013.  He is currently on no medications.  He does think that tremor is somewhat more prominent but it really doesn't bother him.  When he shaves with the R hand, he may note some tremor on the L.  Otherwise, its more like he can feel it rather than see it.  He had an MRI of the brain since last visit.  There was an absent "swallow to tail sign" which can be seen with PD.  He is exercising.  No falls.  No balance problems.  No syncope.  No n/v, visual distortions or hallucinations.  MRI brain 02/13/13:  CLINICAL DATA: Parkinson like symptoms. Trembling of the right arm.  Headache.  BUN and creatinine were obtained on site at [facility] at  [address]  Results: BUN 12 mg/dL, Creatinine 1.0 mg/dL.  EXAM:  MRI HEAD WITHOUT AND WITH CONTRAST  TECHNIQUE:  Multiplanar, multiecho pulse sequences of the brain and surrounding  structures were obtained without and with intravenous contrast.  CONTRAST: 17 cc MultiHance  COMPARISON: None.  FINDINGS:  Diffusion imaging does not show any acute or subacute infarction.  The cerebellum is normal. In the substantia nigra region, I believe  there is an abnormal appearance with absence of the normal  swallow  tail appearance. This goes along with the clinical diagnosis.  The cerebral hemispheres show mild age related atrophy with mild  small vessel change in the deep white matter. No cortical or large  vessel territory infarction. No mass lesion, hemorrhage,  hydrocephalus or extra-axial collection.  IMPRESSION:  No reversible intracranial finding. No acute infarction. Minimal  chronic small vessel change of the deep white matter.  Absent swallow tail sign goes along with the clinical diagnosis of  Parkinson's disease.    PREVIOUS MEDICATIONS: none to date  ALLERGIES:   Allergies  Allergen Reactions  . Garlic     nausea  . Niacin     REACTION: flushing    CURRENT MEDICATIONS:  Current Outpatient Prescriptions on File Prior to Visit  Medication Sig Dispense Refill  . aspirin 81 MG tablet Take 81 mg by mouth daily.        . bimatoprost (LUMIGAN) 0.03 % ophthalmic solution Place 1 drop into both eyes at bedtime.      . Cholecalciferol 2000 UNITS CAPS Take by mouth daily.        . Coenzyme Q10 300 MG CAPS Take by mouth daily.        . fish oil-omega-3 fatty acids 1000 MG capsule Take 2 g by mouth daily.        Marland Kitchen  Flaxseed, Linseed, (FLAX SEED OIL) 1000 MG CAPS Take by mouth daily.        Marland Kitchen levothyroxine (SYNTHROID, LEVOTHROID) 100 MCG tablet Take 1 tablet (100 mcg total) by mouth daily.  90 tablet  2  . Multiple Vitamin (MULTIVITAMIN PO) Take by mouth daily.        . simvastatin (ZOCOR) 20 MG tablet Take 1 tablet (20 mg total) by mouth every evening.  90 tablet  3   No current facility-administered medications on file prior to visit.    PAST MEDICAL HISTORY:   Past Medical History  Diagnosis Date  . Arthritis   . Hypercholesteremia   . GERD (gastroesophageal reflux disease)   . Glaucoma     Dr Hazle Quant  . Hyperthyroidism     s/p RAI  . Anemia 1952    post Oromycin for [place]  . Hearing loss in right ear   . Anisocoria     post op    PAST SURGICAL HISTORY:    Past Surgical History  Procedure Laterality Date  . Septoplasty  1970  . Cardiac catheterization  07/2006    Dr.  Charlies Constable, negative  . Vasectomy    . Rai ablation  02/2008    hyperthyroidism  . Cataract extraction, bilateral      OS retinal surgery for post op floaters  . Colonoscopy      negative X 3    SOCIAL HISTORY:   History   Social History  . Marital Status: Widowed    Spouse Name: N/A    Number of Children: N/A  . Years of Education: N/A   Occupational History  . Retired     Press photographer   Social History Main Topics  . Smoking status: Former Smoker    Quit date: 04/02/1968  . Smokeless tobacco: Never Used  . Alcohol Use: 0.0 oz/week     Comment: 2 beers/day  . Drug Use: No  . Sexual Activity: Not on file   Other Topics Concern  . Not on file   Social History Narrative  . No narrative on file    FAMILY HISTORY:   Family Status  Relation Status Death Age  . Father Deceased 31    natural causes  . Mother Deceased     "old age"  . Brother Deceased     2, train accident; ? [place]  . Brother Alive   . Sister Alive     3, degen arthritis (bedridden at 77 y/o); dementia  . Child Alive     3, alive and well    ROS:  A complete 10 system review of systems was obtained and was unremarkable apart from what is mentioned above.  PHYSICAL EXAMINATION:    VITALS:   Filed Vitals:   03/31/13 0956  BP: 120/82  Pulse: 76  Temp: 97.7 F (36.5 C)  TempSrc: Oral  Resp: 16  Height: 6\' 1"  (1.854 m)  Weight: 194 lb (87.998 kg)    GEN:  The patient appears stated age and is in NAD. HEENT:  Normocephalic, atraumatic.  The mucous membranes are moist. The superficial temporal arteries are without ropiness or tenderness. CV:  RRR Lungs:  CTAB Neck/HEME:  There are no carotid bruits bilaterally.  Neurological examination:  Orientation: The patient is alert and oriented x3. Fund of knowledge is appropriate.  Recent and remote memory are intact.  Attention and  concentration are normal.    Able to name objects and repeat phrases. Cranial nerves: There is  good facial symmetry.  There is facial hypomimia.  Pupils are equal round and reactive to light bilaterally.  There are square wave jerks. The visual fields are full to confrontational testing. The speech is fluent and clear. Soft palate rises symmetrically and there is no tongue deviation. Hearing is intact to conversational tone. Motor: Strength is 5/5 in the bilateral upper and lower extremities.   Shoulder shrug is equal and symmetric.  There is no pronator drift.  Movement examination: Tone: There is minimal increased tone in the left upper extremity, only noted with activation.  Tone in the right upper and bilateral lower extremities is normal.  Abnormal movements: There is a rare intermittent left upper extremity resting tremor.  There is also a jaw tremor present. Coordination:  There is decremation with RAM's, seen only on the left with finger taps and hand opening and closing. Gait and Station: The patient has no difficulty arising out of a deep-seated chair without the use of the hands. The patient's stride length is fairly normal, with marked decrease in arm swing bilaterally.  The patient has a negative pull test.      ASSESSMENT/PLAN:  1.  Tremor  -I do suspect that this represents the onset of a very mild, early parkinsonism, likely Parkinson's disease.    -He and I talked about various treatments once again today.  We talked about pathophysiology.  We talked about the importance of exercise.  We talked about various medications, but ultimately he decided that he really does not want to take any medication at this junction.  He will let me know if he changes his mind.  Greater than 50% of the visit spent in counseling discussing safety, treatment options and prognosis. I would like to see him back at least every 6 months, but encouraged him to call me or send me a message if he needs me before  then.

## 2013-05-27 ENCOUNTER — Other Ambulatory Visit: Payer: Self-pay | Admitting: *Deleted

## 2013-05-27 ENCOUNTER — Ambulatory Visit (INDEPENDENT_AMBULATORY_CARE_PROVIDER_SITE_OTHER): Payer: Commercial Managed Care - HMO | Admitting: Nurse Practitioner

## 2013-05-27 VITALS — BP 109/70 | HR 67 | Temp 97.9°F | Wt 192.2 lb

## 2013-05-27 DIAGNOSIS — N390 Urinary tract infection, site not specified: Secondary | ICD-10-CM

## 2013-05-27 DIAGNOSIS — R300 Dysuria: Secondary | ICD-10-CM

## 2013-05-27 LAB — POCT URINALYSIS DIPSTICK
BILIRUBIN UA: NEGATIVE
Glucose, UA: NEGATIVE
KETONES UA: NEGATIVE
Nitrite, UA: NEGATIVE
Protein, UA: 300
Spec Grav, UA: 1.02
Urobilinogen, UA: 0.2
pH, UA: 6.5

## 2013-05-27 MED ORDER — NITROFURANTOIN MONOHYD MACRO 100 MG PO CAPS
100.0000 mg | ORAL_CAPSULE | Freq: Two times a day (BID) | ORAL | Status: DC
Start: 2013-05-27 — End: 2013-05-27

## 2013-05-27 MED ORDER — NITROFURANTOIN MONOHYD MACRO 100 MG PO CAPS: 100.0000 mg | ORAL_CAPSULE | Freq: Two times a day (BID) | ORAL | Status: DC

## 2013-05-27 NOTE — Addendum Note (Signed)
Addended by: Murtis Sink A on: 05/27/2013 02:08 PM   Modules accepted: Orders

## 2013-05-27 NOTE — Patient Instructions (Addendum)
You may be retaining urine. This can accompany parkinson's disease and can predispose you to urinary tract infection. Please start antibiotic. Sip fluids every hour to flush kidneys. Urinate every 2 hours while awake & press on lower abdomen when urinating to help expel urine. Take 500 mg vitaminC twice daily to acidify urine & create bacteriostatic environment. Please follow up w/Dr Etter Sjogren next month or sooner if symptoms do not improve.   Urinary Tract Infection Urinary tract infections (UTIs) can develop anywhere along your urinary tract. Your urinary tract is your body's drainage system for removing wastes and extra water. Your urinary tract includes two kidneys, two ureters, a bladder, and a urethra. Your kidneys are a pair of bean-shaped organs. Each kidney is about the size of your fist. They are located below your ribs, one on each side of your spine. CAUSES Infections are caused by microbes, which are microscopic organisms, including fungi, viruses, and bacteria. These organisms are so small that they can only be seen through a microscope. Bacteria are the microbes that most commonly cause UTIs. SYMPTOMS  Symptoms of UTIs may vary by age and gender of the patient and by the location of the infection. Symptoms in young women typically include a frequent and intense urge to urinate and a painful, burning feeling in the bladder or urethra during urination. Older women and men are more likely to be tired, shaky, and weak and have muscle aches and abdominal pain. A fever may mean the infection is in your kidneys. Other symptoms of a kidney infection include pain in your back or sides below the ribs, nausea, and vomiting. DIAGNOSIS To diagnose a UTI, your caregiver will ask you about your symptoms. Your caregiver also will ask to provide a urine sample. The urine sample will be tested for bacteria and white blood cells. White blood cells are made by your body to help fight infection. TREATMENT    Typically, UTIs can be treated with medication. Because most UTIs are caused by a bacterial infection, they usually can be treated with the use of antibiotics. The choice of antibiotic and length of treatment depend on your symptoms and the type of bacteria causing your infection. HOME CARE INSTRUCTIONS  If you were prescribed antibiotics, take them exactly as your caregiver instructs you. Finish the medication even if you feel better after you have only taken some of the medication.  Drink enough water and fluids to keep your urine clear or pale yellow.  Avoid caffeine, tea, and carbonated beverages. They tend to irritate your bladder.  Empty your bladder often. Avoid holding urine for long periods of time.  Empty your bladder before and after sexual intercourse.  After a bowel movement, women should cleanse from front to back. Use each tissue only once. SEEK MEDICAL CARE IF:   You have back pain.  You develop a fever.  Your symptoms do not begin to resolve within 3 days. SEEK IMMEDIATE MEDICAL CARE IF:   You have severe back pain or lower abdominal pain.  You develop chills.  You have nausea or vomiting.  You have continued burning or discomfort with urination. MAKE SURE YOU:   Understand these instructions.  Will watch your condition.  Will get help right away if you are not doing well or get worse. Document Released: 12/27/2004 Document Revised: 09/18/2011 Document Reviewed: 04/27/2011 [facility] Patient Information 2014 [place].

## 2013-05-27 NOTE — Progress Notes (Signed)
Pre visit review using our clinic review tool, if applicable. No additional management support is needed unless otherwise documented below in the visit note. 

## 2013-05-27 NOTE — Progress Notes (Signed)
   Subjective:    Patient ID: William Williamson, male    DOB: [birth year], 80 y.o.   MRN: [id number]  Urinary Tract Infection  This is a chronic problem. The current episode started 1 to 4 weeks ago (1 month). The problem occurs every urination. The problem has been gradually worsening. The quality of the pain is described as burning. The pain is mild. There has been no fever. There is no history of pyelonephritis. Associated symptoms include frequency and urgency. Pertinent negatives include no chills, discharge, flank pain, hematuria, hesitancy, nausea, sweats or vomiting. He has tried increased fluids for the symptoms. The treatment provided no relief. recent diagnosis of Parkinson's disease.      Review of Systems  Constitutional: Negative for fever, chills, activity change, appetite change and fatigue.  HENT: Negative for congestion and sore throat.   Respiratory: Negative for shortness of breath.   Gastrointestinal: Positive for constipation (pt states this is chronic since thyroid removed). Negative for nausea, vomiting and diarrhea.  Genitourinary: Positive for dysuria, urgency, frequency and penile pain ("stinging"). Negative for hesitancy, hematuria, flank pain, decreased urine volume, penile swelling, scrotal swelling, difficulty urinating and testicular pain.       Nocturia-6-8 times last night  Musculoskeletal: Negative for back pain.       Objective:   Physical Exam  Vitals reviewed. Constitutional: He is oriented to person, place, and time. He appears well-developed and well-nourished. No distress.  HENT:  Head: Normocephalic and atraumatic.  Eyes: Conjunctivae are normal. Right eye exhibits no discharge. Left eye exhibits no discharge.  Cardiovascular: Normal rate.   Pulmonary/Chest: Effort normal.  Abdominal: Soft. He exhibits no distension and no mass. There is no tenderness. There is no rebound and no guarding.  Hyperactive BS  Musculoskeletal: He exhibits no  tenderness (No CVA tenderness).  Neurological: He is alert and oriented to person, place, and time.  Skin: Skin is warm and dry.  Psychiatric: He has a normal mood and affect. His behavior is normal. Thought content normal.          Assessment & Plan:  1. Dysuria, Frequency, nocturia Duration 1 mo. - Urine culture-pending. - POCT Urinalysis, Dipstick- pos leuks, blood, protein  2. UTI (urinary tract infection) May be r/t urinary retention-recent Dx parkinson's. - nitrofurantoin, macrocrystal-monohydrate, (MACROBID) 100 MG capsule; Take 1 capsule (100 mg total) by mouth 2 (two) times daily.  Dispense: 14 capsule; Refill: 0 See pt instructions.

## 2013-05-28 LAB — URINE CULTURE
COLONY COUNT: NO GROWTH
Organism ID, Bacteria: NO GROWTH

## 2013-05-29 ENCOUNTER — Telehealth: Payer: Self-pay | Admitting: Nurse Practitioner

## 2013-05-29 NOTE — Telephone Encounter (Signed)
Urine culture-no growth. DD: urethritis, epidiymitis. Called to see how pt feeling-LM. No change in treatment, will continue nitrofurantoin.

## 2013-05-29 NOTE — Telephone Encounter (Signed)
See phone note

## 2013-06-17 ENCOUNTER — Telehealth: Payer: Self-pay | Admitting: Internal Medicine

## 2013-06-17 NOTE — Telephone Encounter (Signed)
Patient called and stated that he needs a referral to see Dr Daisey Must (Dermatolgist) Patient has spots on his head.   [employer] id #[id number]

## 2013-06-18 ENCOUNTER — Other Ambulatory Visit: Payer: Self-pay | Admitting: Internal Medicine

## 2013-06-18 DIAGNOSIS — L570 Actinic keratosis: Secondary | ICD-10-CM

## 2013-06-18 NOTE — Telephone Encounter (Signed)
Vaughan Basta to send referral for patient to see Dr. Nevada Crane for dx actinic keratosis 702.0, inflamed seborrheic keratosis 702.11 and numerous moles 216.9?

## 2013-06-19 ENCOUNTER — Encounter: Payer: Self-pay | Admitting: Family Medicine

## 2013-06-19 ENCOUNTER — Ambulatory Visit (INDEPENDENT_AMBULATORY_CARE_PROVIDER_SITE_OTHER): Payer: Commercial Managed Care - HMO | Admitting: Family Medicine

## 2013-06-19 ENCOUNTER — Telehealth: Payer: Self-pay

## 2013-06-19 VITALS — BP 116/74 | HR 52 | Temp 97.9°F | Ht 72.0 in | Wt 189.6 lb

## 2013-06-19 DIAGNOSIS — N390 Urinary tract infection, site not specified: Secondary | ICD-10-CM

## 2013-06-19 DIAGNOSIS — Z0000 Encounter for general adult medical examination without abnormal findings: Secondary | ICD-10-CM

## 2013-06-19 DIAGNOSIS — E785 Hyperlipidemia, unspecified: Secondary | ICD-10-CM

## 2013-06-19 DIAGNOSIS — G20 Parkinson's disease: Secondary | ICD-10-CM

## 2013-06-19 DIAGNOSIS — Z23 Encounter for immunization: Secondary | ICD-10-CM

## 2013-06-19 DIAGNOSIS — E039 Hypothyroidism, unspecified: Secondary | ICD-10-CM

## 2013-06-19 LAB — BASIC METABOLIC PANEL
BUN: 12 mg/dL (ref 6–23)
CO2: 29 meq/L (ref 19–32)
CREATININE: 1 mg/dL (ref 0.4–1.5)
Calcium: 9.5 mg/dL (ref 8.4–10.5)
Chloride: 98 mEq/L (ref 96–112)
GFR: 75.03 mL/min (ref >60.00)
Glucose, Bld: 83 mg/dL (ref 70–99)
Potassium: 4.8 mEq/L (ref 3.5–5.1)
SODIUM: 135 meq/L (ref 135–145)

## 2013-06-19 LAB — CBC WITH DIFFERENTIAL/PLATELET
BASOS PCT: 0.3 % (ref 0.0–3.0)
Basophils Absolute: 0 10*3/uL (ref 0.0–0.1)
EOS ABS: 0.1 10*3/uL (ref 0.0–0.7)
Eosinophils Relative: 1.8 % (ref 0.0–5.0)
HCT: 41.5 % (ref 39.0–52.0)
HEMOGLOBIN: 13.8 g/dL (ref 13.0–17.0)
Lymphocytes Relative: 26.5 % (ref 12.0–46.0)
Lymphs Abs: 2 10*3/uL (ref 0.7–4.0)
MCHC: 33.2 g/dL (ref 30.0–36.0)
MCV: 94.7 fl (ref 78.0–100.0)
MONO ABS: 0.5 10*3/uL (ref 0.1–1.0)
Monocytes Relative: 6.2 % (ref 3.0–12.0)
NEUTROS ABS: 5 10*3/uL (ref 1.4–7.7)
Neutrophils Relative %: 65.2 % (ref 43.0–77.0)
Platelets: 195 10*3/uL (ref 150.0–400.0)
RBC: 4.39 Mil/uL (ref 4.22–5.81)
RDW: 13.1 % (ref 11.5–14.6)
WBC: 7.6 10*3/uL (ref 4.5–10.5)

## 2013-06-19 LAB — HEPATIC FUNCTION PANEL
ALBUMIN: 4.4 g/dL (ref 3.5–5.2)
ALT: 18 U/L (ref 0–53)
AST: 24 U/L (ref 0–37)
Alkaline Phosphatase: 46 U/L (ref 39–117)
Bilirubin, Direct: 0.1 mg/dL (ref 0.0–0.3)
TOTAL PROTEIN: 6.8 g/dL (ref 6.0–8.3)
Total Bilirubin: 1.3 mg/dL — ABNORMAL HIGH (ref 0.3–1.2)

## 2013-06-19 LAB — TSH: TSH: 4.97 u[IU]/mL (ref 0.35–5.50)

## 2013-06-19 LAB — LIPID PANEL
CHOLESTEROL: 153 mg/dL (ref 0–200)
HDL: 47.2 mg/dL (ref >39.00)
LDL Cholesterol: 94 mg/dL (ref 0–99)
TRIGLYCERIDES: 61 mg/dL (ref 0.0–149.0)
Total CHOL/HDL Ratio: 3
VLDL: 12.2 mg/dL (ref 0.0–40.0)

## 2013-06-19 LAB — POCT URINALYSIS DIPSTICK
BILIRUBIN UA: NEGATIVE
Glucose, UA: NEGATIVE
Ketones, UA: NEGATIVE
Nitrite, UA: NEGATIVE
PH UA: 8
Protein, UA: NEGATIVE
Urobilinogen, UA: 0.2

## 2013-06-19 LAB — PSA: PSA: 5.11 ng/mL — ABNORMAL HIGH (ref 0.10–4.00)

## 2013-06-19 MED ORDER — SIMVASTATIN 20 MG PO TABS
20.0000 mg | ORAL_TABLET | Freq: Every evening | ORAL | Status: DC
Start: 2013-06-19 — End: 2014-05-07

## 2013-06-19 MED ORDER — LEVOTHYROXINE SODIUM 100 MCG PO TABS: 100.0000 ug | ORAL_TABLET | Freq: Every day | ORAL | Status: DC

## 2013-06-19 NOTE — Telephone Encounter (Signed)
Medication List and allergies:  Reviewed and updated  90 day supply/mail order: [facility] Local prescriptions: [facility] on [address]  Immunizations due: Unsure if had shingles and PNA vaccines  A/P:   No changes to FH, PSH. Has been diagnosed with Parkinson's/not taking any medication as of yet. Flu vaccine--12/2012 Tdap--07/2008 PNA--unsure Shingles--unsure CCS--2004--followed up with Dr Sharlett Iles 2014 and was deemed no further CCS needed PSA--2011--3.60--doesn't see urology  To Discuss with Provider: Not at this time

## 2013-06-19 NOTE — Patient Instructions (Signed)
Preventive Care for Adults, Male A healthy lifestyle and preventive care can promote health and wellness. Preventive health guidelines for men include the following key practices:  A routine yearly physical is a good way to check with your health care provider about your health and preventative screening. It is a chance to share any concerns and updates on your health and to receive a thorough exam.  Visit your dentist for a routine exam and preventative care every 6 months. Brush your teeth twice a day and floss once a day. Good oral hygiene prevents tooth decay and gum disease.  The frequency of eye exams is based on your age, health, family medical history, use of contact lenses, and other factors. Follow your health care provider's recommendations for frequency of eye exams.  Eat a healthy diet. Foods such as vegetables, fruits, whole grains, low-fat dairy products, and lean protein foods contain the nutrients you need without too many calories. Decrease your intake of foods high in solid fats, added sugars, and salt. Eat the right amount of calories for you.Get information about a proper diet from your health care provider, if necessary.  Regular physical exercise is one of the most important things you can do for your health. Most adults should get at least 150 minutes of moderate-intensity exercise (any activity that increases your heart rate and causes you to sweat) each week. In addition, most adults need muscle-strengthening exercises on 2 or more days a week.  Maintain a healthy weight. The body mass index (BMI) is a screening tool to identify possible weight problems. It provides an estimate of body fat based on height and weight. Your health care provider can find your BMI and can help you achieve or maintain a healthy weight.For adults 20 years and older:  A BMI below 18.5 is considered underweight.  A BMI of 18.5 to 24.9 is normal.  A BMI of 25 to 29.9 is considered  overweight.  A BMI of 30 and above is considered obese.  Maintain normal blood lipids and cholesterol levels by exercising and minimizing your intake of saturated fat. Eat a balanced diet with plenty of fruit and vegetables. Blood tests for lipids and cholesterol should begin at age 42 and be repeated every 5 years. If your lipid or cholesterol levels are high, you are over 50, or you are at high risk for heart disease, you may need your cholesterol levels checked more frequently.Ongoing high lipid and cholesterol levels should be treated with medicines if diet and exercise are not working.  If you smoke, find out from your health care provider how to quit. If you do not use tobacco, do not start.  Lung cancer screening is recommended for adults aged 24 80 years who are at high risk for developing lung cancer because of a history of smoking. A yearly low-dose CT scan of the lungs is recommended for people who have at least a 30-pack-year history of smoking and are a current smoker or have quit within the past 15 years. A pack year of smoking is smoking an average of 1 pack of cigarettes a day for 1 year (for example: 1 pack a day for 30 years or 2 packs a day for 15 years). Yearly screening should continue until the smoker has stopped smoking for at least 15 years. Yearly screening should be stopped for people who develop a health problem that would prevent them from having lung cancer treatment.  If you choose to drink alcohol, do not have  more than 2 drinks per day. One drink is considered to be 12 ounces (355 mL) of beer, 5 ounces (148 mL) of wine, or 1.5 ounces (44 mL) of liquor.  Avoid use of street drugs. Do not share needles with anyone. Ask for help if you need support or instructions about stopping the use of drugs.  High blood pressure causes heart disease and increases the risk of stroke. Your blood pressure should be checked at least every 1 2 years. Ongoing high blood pressure should be  treated with medicines, if weight loss and exercise are not effective.  If you are 75 78 years old, ask your health care provider if you should take aspirin to prevent heart disease.  Diabetes screening involves taking a blood sample to check your fasting blood sugar level. This should be done once every 3 years, after age 19, if you are within normal weight and without risk factors for diabetes. Testing should be considered at a younger age or be carried out more frequently if you are overweight and have at least 1 risk factor for diabetes.  Colorectal cancer can be detected and often prevented. Most routine colorectal cancer screening begins at the age of 47 and continues through age 80. However, your health care provider may recommend screening at an earlier age if you have risk factors for colon cancer. On a yearly basis, your health care provider may provide home test kits to check for hidden blood in the stool. Use of a small camera at the end of a tube to directly examine the colon (sigmoidoscopy or colonoscopy) can detect the earliest forms of colorectal cancer. Talk to your health care provider about this at age 66, when routine screening begins. Direct exam of the colon should be repeated every 5 10 years through age 19, unless early forms of precancerous polyps or small growths are found.  People who are at an increased risk for hepatitis B should be screened for this virus. You are considered at high risk for hepatitis B if:  You were born in a country where hepatitis B occurs often. Talk with your health care provider about which countries are considered high-risk.  Your parents were born in a high-risk country and you have not received a shot to protect against hepatitis B (hepatitis B vaccine).  You have HIV or AIDS.  You use needles to inject street drugs.  You live with, or have sex with, someone who has hepatitis B.  You are a man who has sex with other men (MSM).  You get  hemodialysis treatment.  You take certain medicines for conditions such as cancer, organ transplantation, and autoimmune conditions.  Hepatitis C blood testing is recommended for all people born from [birth year] through 1965 and any individual with known risks for hepatitis C.  Practice safe sex. Use condoms and avoid high-risk sexual practices to reduce the spread of sexually transmitted infections (STIs). STIs include gonorrhea, chlamydia, syphilis, trichomonas, herpes, HPV, and human immunodeficiency virus (HIV). Herpes, HIV, and HPV are viral illnesses that have no cure. They can result in disability, cancer, and death.  A one-time screening for abdominal aortic aneurysm (AAA) and surgical repair of large AAAs by ultrasound are recommended for men ages 94 to 74 years who are current or former smokers.  Healthy men should no longer receive prostate-specific antigen (PSA) blood tests as part of routine cancer screening. Talk with your health care provider about prostate cancer screening.  Testicular cancer screening is not recommended  for adult males who have no symptoms. Screening includes self-exam, a health care provider exam, and other screening tests. Consult with your health care provider about any symptoms you have or any concerns you have about testicular cancer.  Use sunscreen. Apply sunscreen liberally and repeatedly throughout the day. You should seek shade when your shadow is shorter than you. Protect yourself by wearing long sleeves, pants, a wide-brimmed hat, and sunglasses year round, whenever you are outdoors.  Once a month, do a whole-body skin exam, using a mirror to look at the skin on your back. Tell your health care provider about new moles, moles that have irregular borders, moles that are larger than a pencil eraser, or moles that have changed in shape or color.  Stay current with required vaccines (immunizations).  Influenza vaccine. All adults should be immunized every  year.  Tetanus, diphtheria, and acellular pertussis (Td, Tdap) vaccine. An adult who has not previously received Tdap or who does not know his vaccine status should receive 1 dose of Tdap. This initial dose should be followed by tetanus and diphtheria toxoids (Td) booster doses every 10 years. Adults with an unknown or incomplete history of completing a 3-dose immunization series with Td-containing vaccines should begin or complete a primary immunization series including a Tdap dose. Adults should receive a Td booster every 10 years.  Varicella vaccine. An adult without evidence of immunity to varicella should receive 2 doses or a second dose if he has previously received 1 dose.  Human papillomavirus (HPV) vaccine. Males aged 44 21 years who have not received the vaccine previously should receive the 3-dose series. Males aged 43 26 years may be immunized. Immunization is recommended through the age of 50 years for any male who has sex with males and did not get any or all doses earlier. Immunization is recommended for any person with an immunocompromised condition through the age of 23 years if he did not get any or all doses earlier. During the 3-dose series, the second dose should be obtained 4 8 weeks after the first dose. The third dose should be obtained 24 weeks after the first dose and 16 weeks after the second dose.  Zoster vaccine. One dose is recommended for adults aged 96 years or older unless certain conditions are present.  Measles, mumps, and rubella (MMR) vaccine. Adults born before 55 generally are considered immune to measles and mumps. Adults born in [birth year] or later should have 1 or more doses of MMR vaccine unless there is a contraindication to the vaccine or there is laboratory evidence of immunity to each of the three diseases. A routine second dose of MMR vaccine should be obtained at least 28 days after the first dose for students attending postsecondary schools, health care  workers, or international travelers. People who received inactivated measles vaccine or an unknown type of measles vaccine during 1963 1967 should receive 2 doses of MMR vaccine. People who received inactivated mumps vaccine or an unknown type of mumps vaccine before 1979 and are at high risk for mumps infection should consider immunization with 2 doses of MMR vaccine. Unvaccinated health care workers born before [birth year] who lack laboratory evidence of measles, mumps, or rubella immunity or laboratory confirmation of disease should consider measles and mumps immunization with 2 doses of MMR vaccine or rubella immunization with 1 dose of MMR vaccine.  Pneumococcal 13-valent conjugate (PCV13) vaccine. When indicated, a person who is uncertain of his immunization history and has no record of immunization  should receive the PCV13 vaccine. An adult aged 67 years or older who has certain medical conditions and has not been previously immunized should receive 1 dose of PCV13 vaccine. This PCV13 should be followed with a dose of pneumococcal polysaccharide (PPSV23) vaccine. The PPSV23 vaccine dose should be obtained at least 8 weeks after the dose of PCV13 vaccine. An adult aged 79 years or older who has certain medical conditions and previously received 1 or more doses of PPSV23 vaccine should receive 1 dose of PCV13. The PCV13 vaccine dose should be obtained 1 or more years after the last PPSV23 vaccine dose.  Pneumococcal polysaccharide (PPSV23) vaccine. When PCV13 is also indicated, PCV13 should be obtained first. All adults aged 74 years and older should be immunized. An adult younger than age 50 years who has certain medical conditions should be immunized. Any person who resides in a nursing home or long-term care facility should be immunized. An adult smoker should be immunized. People with an immunocompromised condition and certain other conditions should receive both PCV13 and PPSV23 vaccines. People with human  immunodeficiency virus (HIV) infection should be immunized as soon as possible after diagnosis. Immunization during chemotherapy or radiation therapy should be avoided. Routine use of PPSV23 vaccine is not recommended for American Indians, [place] Natives, or people younger than 65 years unless there are medical conditions that require PPSV23 vaccine. When indicated, people who have unknown immunization and have no record of immunization should receive PPSV23 vaccine. One-time revaccination 5 years after the first dose of PPSV23 is recommended for people aged 41 64 years who have chronic kidney failure, nephrotic syndrome, asplenia, or immunocompromised conditions. People who received 1 2 doses of PPSV23 before age 15 years should receive another dose of PPSV23 vaccine at age 48 years or later if at least 5 years have passed since the previous dose. Doses of PPSV23 are not needed for people immunized with PPSV23 at or after age 69 years.  Meningococcal vaccine. Adults with asplenia or persistent complement component deficiencies should receive 2 doses of quadrivalent meningococcal conjugate (MenACWY-D) vaccine. The doses should be obtained at least 2 months apart. Microbiologists working with certain meningococcal bacteria, [place] recruits, people at risk during an outbreak, and people who travel to or live in countries with a high rate of meningitis should be immunized. A first-year college student up through age 7 years who is living in a residence hall should receive a dose if he did not receive a dose on or after his 16th birthday. Adults who have certain high-risk conditions should receive one or more doses of vaccine.  Hepatitis A vaccine. Adults who wish to be protected from this disease, have certain high-risk conditions, work with hepatitis A-infected animals, work in hepatitis A research labs, or travel to or work in countries with a high rate of hepatitis A should be immunized. Adults who were  previously unvaccinated and who anticipate close contact with an international adoptee during the first 60 days after arrival in the [place] from a country with a high rate of hepatitis A should be immunized.  Hepatitis B vaccine. Adults who wish to be protected from this disease, have certain high-risk conditions, may be exposed to blood or other infectious body fluids, are household contacts or sex partners of hepatitis B positive people, are clients or workers in certain care facilities, or travel to or work in countries with a high rate of hepatitis B should be immunized.  Haemophilus influenzae type b (Hib) vaccine. A  previously unvaccinated person with asplenia or sickle cell disease or having a scheduled splenectomy should receive 1 dose of Hib vaccine. Regardless of previous immunization, a recipient of a hematopoietic stem cell transplant should receive a 3-dose series 6 12 months after his successful transplant. Hib vaccine is not recommended for adults with HIV infection. Preventive Service / Frequency Ages 62 to 3  Blood pressure check.** / Every 1 to 2 years.  Lipid and cholesterol check.** / Every 5 years beginning at age 43.  Hepatitis C blood test.** / For any individual with known risks for hepatitis C.  Skin self-exam. / Monthly.  Influenza vaccine. / Every year.  Tetanus, diphtheria, and acellular pertussis (Tdap, Td) vaccine.** / Consult your health care provider. 1 dose of Td every 10 years.  Varicella vaccine.** / Consult your health care provider.  HPV vaccine. / 3 doses over 6 months, if 48 or younger.  Measles, mumps, rubella (MMR) vaccine.** / You need at least 1 dose of MMR if you were born in 1957 or later. You may also need a second dose.  Pneumococcal 13-valent conjugate (PCV13) vaccine.** / Consult your health care provider.  Pneumococcal polysaccharide (PPSV23) vaccine.** / 1 to 2 doses if you smoke cigarettes or if you have certain  conditions.  Meningococcal vaccine.** / 1 dose if you are age 8 to 70 years and a Market researcher living in a residence hall, or have one of several medical conditions. You may also need additional booster doses.  Hepatitis A vaccine.** / Consult your health care provider.  Hepatitis B vaccine.** / Consult your health care provider.  Haemophilus influenzae type b (Hib) vaccine.** / Consult your health care provider. Ages 48 to 32  Blood pressure check.** / Every 1 to 2 years.  Lipid and cholesterol check.** / Every 5 years beginning at age 38.  Lung cancer screening. / Every year if you are aged 40 80 years and have a 30-pack-year history of smoking and currently smoke or have quit within the past 15 years. Yearly screening is stopped once you have quit smoking for at least 15 years or develop a health problem that would prevent you from having lung cancer treatment.  Fecal occult blood test (FOBT) of stool. / Every year beginning at age 4 and continuing until age 70. You may not have to do this test if you get a colonoscopy every 10 years.  Flexible sigmoidoscopy** or colonoscopy.** / Every 5 years for a flexible sigmoidoscopy or every 10 years for a colonoscopy beginning at age 76 and continuing until age 62.  Hepatitis C blood test.** / For all people born from [birth year] through 1965 and any individual with known risks for hepatitis C.  Skin self-exam. / Monthly.  Influenza vaccine. / Every year.  Tetanus, diphtheria, and acellular pertussis (Tdap/Td) vaccine.** / Consult your health care provider. 1 dose of Td every 10 years.  Varicella vaccine.** / Consult your health care provider.  Zoster vaccine.** / 1 dose for adults aged 60 years or older.  Measles, mumps, rubella (MMR) vaccine.** / You need at least 1 dose of MMR if you were born in 1957 or later. You may also need a second dose.  Pneumococcal 13-valent conjugate (PCV13) vaccine.** / Consult your health care  provider.  Pneumococcal polysaccharide (PPSV23) vaccine.** / 1 to 2 doses if you smoke cigarettes or if you have certain conditions.  Meningococcal vaccine.** / Consult your health care provider.  Hepatitis A vaccine.** / Consult your health care  provider.  Hepatitis B vaccine.** / Consult your health care provider.  Haemophilus influenzae type b (Hib) vaccine.** / Consult your health care provider. Ages 65 and over  Blood pressure check.** / Every 1 to 2 years.  Lipid and cholesterol check.**/ Every 5 years beginning at age 20.  Lung cancer screening. / Every year if you are aged 55 80 years and have a 30-pack-year history of smoking and currently smoke or have quit within the past 15 years. Yearly screening is stopped once you have quit smoking for at least 15 years or develop a health problem that would prevent you from having lung cancer treatment.  Fecal occult blood test (FOBT) of stool. / Every year beginning at age 50 and continuing until age 75. You may not have to do this test if you get a colonoscopy every 10 years.  Flexible sigmoidoscopy** or colonoscopy.** / Every 5 years for a flexible sigmoidoscopy or every 10 years for a colonoscopy beginning at age 50 and continuing until age 75.  Hepatitis C blood test.** / For all people born from 1945 through 1965 and any individual with known risks for hepatitis C.  Abdominal aortic aneurysm (AAA) screening.** / A one-time screening for ages 65 to 75 years who are current or former smokers.  Skin self-exam. / Monthly.  Influenza vaccine. / Every year.  Tetanus, diphtheria, and acellular pertussis (Tdap/Td) vaccine.** / 1 dose of Td every 10 years.  Varicella vaccine.** / Consult your health care provider.  Zoster vaccine.** / 1 dose for adults aged 60 years or older.  Pneumococcal 13-valent conjugate (PCV13) vaccine.** / Consult your health care provider.  Pneumococcal polysaccharide (PPSV23) vaccine.** / 1 dose for all  adults aged 65 years and older.  Meningococcal vaccine.** / Consult your health care provider.  Hepatitis A vaccine.** / Consult your health care provider.  Hepatitis B vaccine.** / Consult your health care provider.  Haemophilus influenzae type b (Hib) vaccine.** / Consult your health care provider. **Family history and personal history of risk and conditions may change your health care provider's recommendations. Document Released: 05/15/2001 Document Revised: 01/07/2013 Document Reviewed: 08/14/2010 [employer] Patient Information 2014 [employer].  

## 2013-06-19 NOTE — Progress Notes (Signed)
Pre visit review using our clinic review tool, if applicable. No additional management support is needed unless otherwise documented below in the visit note. 
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
## 2013-06-19 NOTE — Addendum Note (Signed)
Addended by: Modena Morrow D on: 06/19/2013 04:56 PM   Modules accepted: Orders

## 2013-06-20 ENCOUNTER — Other Ambulatory Visit: Payer: Self-pay | Admitting: Family Medicine

## 2013-06-20 DIAGNOSIS — R972 Elevated prostate specific antigen [PSA]: Secondary | ICD-10-CM

## 2013-06-21 LAB — URINE CULTURE
Colony Count: NO GROWTH
Organism ID, Bacteria: NO GROWTH

## 2013-07-06 ENCOUNTER — Telehealth: Payer: Self-pay | Admitting: Family Medicine

## 2013-07-06 NOTE — Telephone Encounter (Signed)
These referral were put in in March. To [place] for review.     KP

## 2013-07-06 NOTE — Telephone Encounter (Signed)
4.6.15  Pt is needing referrals; Dr. Marguerite Olea, dermatologist, Dr. Alger Simons, urologist.  Please contact pt to advise.

## 2013-07-20 ENCOUNTER — Telehealth: Payer: Self-pay | Admitting: Neurology

## 2013-07-20 NOTE — Telephone Encounter (Signed)
I think that I would like to see him before June since he has noted changes.  Does he think that he could come in next week?

## 2013-07-20 NOTE — Telephone Encounter (Signed)
Spoke with patient - since his appt in December he states his right hand has developed a tremor. He also states for the last week he feels like a third of his head around to his left ear feels like it is sunburnt. It is sore to the touch but not red or inflamed. He has had no other symptoms - no headaches, etc. He wants to know if this can be related to his Parkinson's Disease and if he needs seen sooner than 09/29/2013- Please advise.

## 2013-07-20 NOTE — Telephone Encounter (Signed)
Spoke with patient and he would like to be seen sooner. I tried to move appt sooner but he was insistent that he had to have a referral again. He will contact his PCP and make a sooner appt.

## 2013-07-20 NOTE — Telephone Encounter (Signed)
Pt called requesting to speak to a nurse.  Please call pt.

## 2013-07-21 ENCOUNTER — Ambulatory Visit (INDEPENDENT_AMBULATORY_CARE_PROVIDER_SITE_OTHER): Payer: Commercial Managed Care - HMO | Admitting: Family Medicine

## 2013-07-21 ENCOUNTER — Encounter: Payer: Self-pay | Admitting: Family Medicine

## 2013-07-21 VITALS — BP 114/76 | HR 53 | Temp 98.3°F | Wt 191.0 lb

## 2013-07-21 DIAGNOSIS — M792 Neuralgia and neuritis, unspecified: Secondary | ICD-10-CM

## 2013-07-21 DIAGNOSIS — G20 Parkinson's disease: Secondary | ICD-10-CM

## 2013-07-21 DIAGNOSIS — IMO0002 Reserved for concepts with insufficient information to code with codable children: Secondary | ICD-10-CM

## 2013-07-21 MED ORDER — VALACYCLOVIR HCL 1 G PO TABS: 1000.0000 mg | ORAL_TABLET | Freq: Three times a day (TID) | ORAL | Status: DC

## 2013-07-21 NOTE — Patient Instructions (Addendum)
Shingles Shingles (herpes zoster) is an infection that is caused by the same virus that causes chickenpox (varicella). The infection causes a painful skin rash and fluid-filled blisters, which eventually break open, crust over, and heal. It may occur in any area of the body, but it usually affects only one side of the body or face. The pain of shingles usually lasts about 1 month. However, some people with shingles may develop long-term (chronic) pain in the affected area of the body. Shingles often occurs many years after the person had chickenpox. It is more common:  In people older than 50 years.  In people with weakened immune systems, such as those with HIV, AIDS, or cancer.  In people taking medicines that weaken the immune system, such as transplant medicines.  In people under great stress. CAUSES  Shingles is caused by the varicella zoster virus (VZV), which also causes chickenpox. After a person is infected with the virus, it can remain in the person's body for years in an inactive state (dormant). To cause shingles, the virus reactivates and breaks out as an infection in a nerve root. The virus can be spread from person to person (contagious) through contact with open blisters of the shingles rash. It will only spread to people who have not had chickenpox. When these people are exposed to the virus, they may develop chickenpox. They will not develop shingles. Once the blisters scab over, the person is no longer contagious and cannot spread the virus to others. SYMPTOMS  Shingles shows up in stages. The initial symptoms may be pain, itching, and tingling in an area of the skin. This pain is usually described as burning, stabbing, or throbbing.In a few days or weeks, a painful red rash will appear in the area where the pain, itching, and tingling were felt. The rash is usually on one side of the body in a band or belt-like pattern. Then, the rash usually turns into fluid-filled blisters. They  will scab over and dry up in approximately 2 3 weeks. Flu-like symptoms may also occur with the initial symptoms, the rash, or the blisters. These may include:  Fever.  Chills.  Headache.  Upset stomach. DIAGNOSIS  Your caregiver will perform a skin exam to diagnose shingles. Skin scrapings or fluid samples may also be taken from the blisters. This sample will be examined under a microscope or sent to a lab for further testing. TREATMENT  There is no specific cure for shingles. Your caregiver will likely prescribe medicines to help you manage the pain, recover faster, and avoid long-term problems. This may include antiviral drugs, anti-inflammatory drugs, and pain medicines. HOME CARE INSTRUCTIONS   Take a cool bath or apply cool compresses to the area of the rash or blisters as directed. This may help with the pain and itching.   Only take over-the-counter or prescription medicines as directed by your caregiver.   Rest as directed by your caregiver.  Keep your rash and blisters clean with mild soap and cool water or as directed by your caregiver.  Do not pick your blisters or scratch your rash. Apply an anti-itch cream or numbing creams to the affected area as directed by your caregiver.  Keep your shingles rash covered with a loose bandage (dressing).  Avoid skin contact with:  Babies.   Pregnant women.   Children with eczema.   Elderly people with transplants.   People with chronic illnesses, such as leukemia or AIDS.   Wear loose-fitting clothing to help ease   the pain of material rubbing against the rash.  Keep all follow-up appointments with your caregiver.If the area involved is on your face, you may receive a referral for follow-up to a specialist, such as an eye doctor (ophthalmologist) or an ear, nose, and throat (ENT) doctor. Keeping all follow-up appointments will help you avoid eye complications, chronic pain, or disability.  SEEK IMMEDIATE MEDICAL  CARE IF:   You have facial pain, pain around the eye area, or loss of feeling on one side of your face.  You have ear pain or ringing in your ear.  You have loss of taste.  Your pain is not relieved with prescribed medicines.   Your redness or swelling spreads.   You have more pain and swelling.  Your condition is worsening or has changed.   You have a feveror persistent symptoms for more than 2 3 days.  You have a fever and your symptoms suddenly get worse. MAKE SURE YOU:  Understand these instructions.  Will watch your condition.  Will get help right away if you are not doing well or get worse. Document Released: 03/19/2005 Document Revised: 12/12/2011 Document Reviewed: 11/01/2011 [employer] Patient Information 2014 [employer].  

## 2013-07-21 NOTE — Progress Notes (Signed)
   Subjective:    Patient ID: William Williamson, male    DOB: 12/03/1932, 80 y.o.   MRN: [id number]  HPI Pt here c/o increased sensitivity of scalp ,ear and neck on Left side.   No rash but he is concerned about shingles.  He also needs a new referral to the neurologist.  He feels his parkinsons is worsening.     Review of Systems As above     Objective:   Physical Exam  BP 114/76  Pulse 53  Temp(Src) 98.3 F (36.8 C) (Oral)  Wt 191 lb (86.637 kg)  SpO2 97% General appearance: alert, cooperative, appears stated age and no distress Ears: normal TM's and external ear canals both ears Nose: Nares normal. Septum midline. Mucosa normal. No drainage or sinus tenderness. Throat: lips, mucosa, and tongue normal; teeth and gums normal Neck: no adenopathy, supple, symmetrical, trachea midline and thyroid not enlarged, symmetric, no tenderness/mass/nodules Lungs: clear to auscultation bilaterally Heart: regular rate and rhythm, S1, S2 normal, no murmur, click, rub or gallop Neurologic: Sensory: increased tactile sense C-2 on the left       Assessment & Plan:  1. L side facial pain--- ? shingles   - valACYclovir (VALTREX) 1000 MG tablet; Take 1 tablet (1,000 mg total) by mouth 3 (three) times daily.  Dispense: 30 tablet; Refill: 0  2. Parkinson disease Pt states he needs a new referral---symptoms are worsening - Ambulatory referral to Neurology

## 2013-07-21 NOTE — Progress Notes (Signed)
Pre visit review using our clinic review tool, if applicable. No additional management support is needed unless otherwise documented below in the visit note. 

## 2013-07-28 ENCOUNTER — Encounter: Payer: Self-pay | Admitting: Neurology

## 2013-07-28 ENCOUNTER — Ambulatory Visit (INDEPENDENT_AMBULATORY_CARE_PROVIDER_SITE_OTHER): Payer: Commercial Managed Care - HMO | Admitting: Neurology

## 2013-07-28 VITALS — BP 118/64 | HR 60 | Resp 18 | Ht 73.0 in | Wt 190.0 lb

## 2013-07-28 DIAGNOSIS — H9319 Tinnitus, unspecified ear: Secondary | ICD-10-CM

## 2013-07-28 DIAGNOSIS — G20 Parkinson's disease: Secondary | ICD-10-CM

## 2013-07-28 MED ORDER — CARBIDOPA-LEVODOPA 25-100 MG PO TABS: 1.0000 | ORAL_TABLET | Freq: Three times a day (TID) | ORAL | Status: DC

## 2013-07-28 NOTE — Progress Notes (Signed)
William Williamson was seen today in the movement disorders clinic for neurologic consultation at the request of Unice Cobble, MD.  The consultation is for the evaluation of left hand tremor and weakness.  The pt is 80 y.o. male R hand dominant.  He states that he got bit by many ticks in the summer and got headaches.  He then noted L hand tremor some time later (about 1 month ago).  The pt thought that it was lyme disease and he was told that he did not have lyme disease and did not have RMSF.  He notes L hand tremor most when sitting down.  He does not notice it when he is moving.  He has no tremor in the L leg or on the R side   03/31/13 update:  The patient presents today for followup.  He was diagnosed with mild Parkinson's disease on 02/03/2013.  He is currently on no medications.  He does think that tremor is somewhat more prominent but it really doesn't bother him.  When he shaves with the R hand, he may note some tremor on the L.  Otherwise, its more like he can feel it rather than see it.  He had an MRI of the brain since last visit.  There was an absent "swallow to tail sign" which can be seen with PD.  He is exercising.  No falls.  No balance problems.  No syncope.  No n/v, visual distortions or hallucinations.  07/28/13 update:  The patient is seen somewhat earlier than expected.  He does have history of Parkinson's disease.  He has not wanted to be on any medications.  He called recently and said that tremor, which was previously only on the left had spread to the right hand.  The right hand shakes more than the L now.  The L seems "docile" now.    One fall.  Pt was on the bicycle and the dog was on the leash and he fell off the bike.  He walks 3 miles every AM.  In addition, he was concerned because he felt like he had a "sunburn" on his scalp.  He did recently see his primary care physician.  I reviewed those notes.  He was started on Valtrex on April 21 for possible herpetic neuralgia.  He  did have shingles vaccine previously.  Is feeling better in that regard.  MRI brain 02/13/13:  CLINICAL DATA: Parkinson like symptoms. Trembling of the right arm.  Headache.  BUN and creatinine were obtained on site at [place] at  [address]  Results: BUN 12 mg/dL, Creatinine 1.0 mg/dL.  EXAM:  MRI HEAD WITHOUT AND WITH CONTRAST  TECHNIQUE:  Multiplanar, multiecho pulse sequences of the brain and surrounding  structures were obtained without and with intravenous contrast.  CONTRAST: 17 cc MultiHance  COMPARISON: None.  FINDINGS:  Diffusion imaging does not show any acute or subacute infarction.  The cerebellum is normal. In the substantia nigra region, I believe  there is an abnormal appearance with absence of the normal swallow  tail appearance. This goes along with the clinical diagnosis.  The cerebral hemispheres show mild age related atrophy with mild  small vessel change in the deep white matter. No cortical or large  vessel territory infarction. No mass lesion, hemorrhage,  hydrocephalus or extra-axial collection.  IMPRESSION:  No reversible intracranial finding. No acute infarction. Minimal  chronic small vessel change of the deep white matter.  Absent swallow tail sign  goes along with the clinical diagnosis of  Parkinson's disease.    PREVIOUS MEDICATIONS: none to date  ALLERGIES:   Allergies  Allergen Reactions  . Garlic     nausea  . Niacin     REACTION: flushing    CURRENT MEDICATIONS:  Current Outpatient Prescriptions on File Prior to Visit  Medication Sig Dispense Refill  . aspirin 81 MG tablet Take 81 mg by mouth daily.        . bimatoprost (LUMIGAN) 0.03 % ophthalmic solution Place 1 drop into both eyes at bedtime.      . Cholecalciferol 2000 UNITS CAPS Take by mouth daily.        . Coenzyme Q10 300 MG CAPS Take by mouth daily.        . dorzolamide-timolol (COSOPT) 22.3-6.8 MG/ML ophthalmic solution Place 1 drop into both eyes 2  (two) times daily.       . fish oil-omega-3 fatty acids 1000 MG capsule Take 2 g by mouth daily.        . Flaxseed, Linseed, (FLAX SEED OIL) 1000 MG CAPS Take by mouth daily.        Marland Kitchen levothyroxine (SYNTHROID, LEVOTHROID) 100 MCG tablet Take 1 tablet (100 mcg total) by mouth daily.  90 tablet  3  . Multiple Vitamin (MULTIVITAMIN PO) Take by mouth daily.        . simvastatin (ZOCOR) 20 MG tablet Take 1 tablet (20 mg total) by mouth every evening.  90 tablet  3  . valACYclovir (VALTREX) 1000 MG tablet Take 1 tablet (1,000 mg total) by mouth 3 (three) times daily.  30 tablet  0   No current facility-administered medications on file prior to visit.    PAST MEDICAL HISTORY:   Past Medical History  Diagnosis Date  . Arthritis   . Hypercholesteremia   . GERD (gastroesophageal reflux disease)   . Glaucoma     Dr Bing Plume  . Hyperthyroidism     s/p RAI  . Anemia 1952    post Oromycin for [place]  . Hearing loss in right ear   . Anisocoria     post op  . Parkinson's disease     PAST SURGICAL HISTORY:   Past Surgical History  Procedure Laterality Date  . Septoplasty  1970  . Cardiac catheterization  07/2006    Dr.  Eustace Quail, negative  . Vasectomy    . Rai ablation  02/2008    hyperthyroidism  . Cataract extraction, bilateral      OS retinal surgery for post op floaters  . Colonoscopy      negative X 3    SOCIAL HISTORY:   History   Social History  . Marital Status: Widowed    Spouse Name: N/A    Number of Children: N/A  . Years of Education: N/A   Occupational History  . Retired     Probation officer   Social History Main Topics  . Smoking status: Former Smoker    Quit date: 04/02/1968  . Smokeless tobacco: Never Used  . Alcohol Use: 0.0 oz/week     Comment: 2 beers/day  . Drug Use: No  . Sexual Activity: Not on file   Other Topics Concern  . Not on file   Social History Narrative  . No narrative on file    FAMILY HISTORY:   Family Status  Relation Status  Death Age  . Father Deceased 60    natural causes  . Mother Deceased     "  old age"  . Brother Deceased     2, train accident; ? [place]  . Brother Alive   . Sister Alive     3, degen arthritis (bedridden at 78 y/o); dementia  . Child Alive     3, alive and well    ROS:  A complete 10 system review of systems was obtained and was unremarkable apart from what is mentioned above.  PHYSICAL EXAMINATION:    VITALS:   There were no vitals filed for this visit.  GEN:  The patient appears stated age and is in NAD. HEENT:  Normocephalic, atraumatic.  The mucous membranes are moist. The superficial temporal arteries are without ropiness or tenderness. CV:  RRR Lungs:  CTAB Neck/HEME:  There are no carotid bruits bilaterally.  Neurological examination:  Orientation: The patient is alert and oriented x3. Fund of knowledge is appropriate.  Recent and remote memory are intact.  Attention and concentration are normal.    Able to name objects and repeat phrases. Cranial nerves: There is good facial symmetry.  There is facial hypomimia.  Pupils are equal round and reactive to light bilaterally.  There are square wave jerks. The visual fields are full to confrontational testing. The speech is fluent and clear. Soft palate rises symmetrically and there is no tongue deviation. Hearing is intact to conversational tone. Motor: Strength is 5/5 in the bilateral upper and lower extremities.   Shoulder shrug is equal and symmetric.  There is no pronator drift.  Movement examination: Tone: There is minimal increased tone in the left upper extremity, only noted with activation.  Tone in the right upper and bilateral lower extremities is normal.  Abnormal movements: There is no left upper extremity resting tremor today but there is a RUE resting tremor that increases with ambulation. There is also a jaw tremor present. Coordination:  There is decremation with RAM's, seen with all forms of RAM's on the L. Gait and  Station: The patient has no difficulty arising out of a deep-seated chair without the use of the hands. The patient's stride length is more short stepped today and slightly more unsteady today.  There is a marked increase in RUE resting tremor with ambulation.   ASSESSMENT/PLAN:  1.  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  Risks, benefits, side effects and alternative therapies were discussed.  The opportunity to ask questions was given and they were answered to the best of my ability.  The patient expressed understanding and willingness to follow the outlined treatment protocols.  -He will continue to exercise 2.  Tinnitus  -I told him that this is not associated with PD and is likely due to hearing loss.  Pt admits that this started when began to have problems with hearing 3.  Shingles with herpetic neuralgia  -Pt had shingles vaccine and is on valtrex and is now sx free on the scalp.  No lesions noted.   4.  Return in about 10 weeks (around 10/06/2013).

## 2013-07-28 NOTE — Addendum Note (Signed)
Addended byAnnamaria Helling on: 07/28/2013 10:52 AM   Modules accepted: Orders

## 2013-07-28 NOTE — Patient Instructions (Signed)
1. Start Carbidopa Levodopa as follows: 1/2 tab three times a day before meals x 1 wk, then 1/2 in am & noon & 1 in evening for a week, then 1/2 in am &1 at noon &one in evening for a week, then 1 tablet three times a day before meals 2. Follow up 2-3 months.

## 2013-09-02 ENCOUNTER — Other Ambulatory Visit: Payer: Self-pay | Admitting: Family Medicine

## 2013-09-02 ENCOUNTER — Telehealth: Payer: Self-pay | Admitting: Internal Medicine

## 2013-09-02 DIAGNOSIS — H409 Unspecified glaucoma: Secondary | ICD-10-CM

## 2013-09-02 NOTE — Telephone Encounter (Signed)
Patient left vm requesting [facility] referrals to Dr. Bing Plume for annual glaucoma check (09/23/13) and visit with Dr. Carles Collet (09/29/13). There is already a recent referral to Dr. Carles Collet. Can you enter new referral for ophthalmology?

## 2013-09-02 NOTE — Telephone Encounter (Signed)
[facility] referrals submitted. See referrals for any further notes.

## 2013-09-02 NOTE — Telephone Encounter (Signed)
Please advise      KP 

## 2013-09-02 NOTE — Telephone Encounter (Signed)
entered

## 2013-09-03 NOTE — Telephone Encounter (Signed)
Referral to Dr. Carles Collet Adella Nissen auth # [id number] valid 09/29/13-03/31/14 # visits: 6

## 2013-09-29 ENCOUNTER — Ambulatory Visit: Payer: Medicare Other | Admitting: Neurology

## 2013-10-06 ENCOUNTER — Encounter: Payer: Self-pay | Admitting: Neurology

## 2013-10-06 ENCOUNTER — Ambulatory Visit (INDEPENDENT_AMBULATORY_CARE_PROVIDER_SITE_OTHER): Payer: Commercial Managed Care - HMO | Admitting: Neurology

## 2013-10-06 VITALS — BP 148/78 | HR 60 | Resp 16 | Ht 73.0 in | Wt 187.0 lb

## 2013-10-06 DIAGNOSIS — G20A1 Parkinson's disease without dyskinesia, without mention of fluctuations: Secondary | ICD-10-CM

## 2013-10-06 DIAGNOSIS — K5909 Other constipation: Secondary | ICD-10-CM

## 2013-10-06 DIAGNOSIS — G20 Parkinson's disease: Secondary | ICD-10-CM

## 2013-10-06 DIAGNOSIS — H9193 Unspecified hearing loss, bilateral: Secondary | ICD-10-CM

## 2013-10-06 DIAGNOSIS — H9190 Unspecified hearing loss, unspecified ear: Secondary | ICD-10-CM

## 2013-10-06 NOTE — Patient Instructions (Addendum)
Move dosages of carbidopa/levodopa 25/100 closer together so that you are taking them at approximately 6 am/10-11am/4-5 pm Constipation and Parkinson's disease:  1.Rancho recipe for constipation in Parkinsons Disease:  -1 cup of bran, 2 cups of applesauce in 1 cup of prune juice 2.  Increase fiber intake (Metamucil,vegetables) 3.  Regular, moderate exercise can be beneficial. 4.  Avoid medications causing constipation, such as medications like antacids with calcium or magnesium 5.  Laxative overuse should be avoided. 6.  Stool softeners (Colace) can help with chronic constipation.

## 2013-10-06 NOTE — Progress Notes (Signed)
William Williamson was seen today in the movement disorders clinic for neurologic consultation at the request of William Cobble, MD.  The consultation is for the evaluation of left hand tremor and weakness.  The pt is 80 y.o. male R hand dominant.  He states that he got bit by many ticks in the summer and got headaches.  He then noted L hand tremor some time later (about 1 month ago).  The pt thought that it was lyme disease and he was told that he did not have lyme disease and did not have RMSF.  He notes L hand tremor most when sitting down.  He does not notice it when he is moving.  He has no tremor in the L leg or on the R side   03/31/13 update:  The patient presents today for followup.  He was diagnosed with mild Parkinson's disease on 02/03/2013.  He is currently on no medications.  He does think that tremor is somewhat more prominent but it really doesn't bother him.  When he shaves with the R hand, he may note some tremor on the L.  Otherwise, its more like he can feel it rather than see it.  He had an MRI of the brain since last visit.  There was an absent "swallow to tail sign" which can be seen with PD.  He is exercising.  No falls.  No balance problems.  No syncope.  No n/v, visual distortions or hallucinations.  07/28/13 update:  The patient is seen somewhat earlier than expected.  He does have history of Parkinson's disease.  He has not wanted to be on any medications.  He called recently and said that tremor, which was previously only on the left had spread to the right hand.  The right hand shakes more than the L now.  The L seems "docile" now.    One fall.  Pt was on the bicycle and the dog was on the leash and he fell off the bike.  He walks 3 miles every AM.  In addition, he was concerned because he felt like he had a "sunburn" on his scalp.  He did recently see his primary care physician.  I reviewed those notes.  He was started on Valtrex on April 21 for possible herpetic neuralgia.  He  did have shingles vaccine previously.  Is feeling better in that regard.  10/06/13 update:  Pt was started on carbidopa/levodopa 25/100 last visit. He takes it at 6:30/4-5pm/11pm.   He no longer has tremor with ambulation on the R but does still note the tremor with resting the hand in the lap while watching TV.  No falls.  No hallucinations.  No lightheadedness or near syncope.  He is walking 3 miles per day and biking a mile.  He is having some constipation.  MRI brain 02/13/13:  CLINICAL DATA: Parkinson like symptoms. Trembling of the right arm.  Headache.  BUN and creatinine were obtained on site at [facility] [facility] at  [address]  Results: BUN 12 mg/dL, Creatinine 1.0 mg/dL.  EXAM:  MRI HEAD WITHOUT AND WITH CONTRAST  TECHNIQUE:  Multiplanar, multiecho pulse sequences of the brain and surrounding  structures were obtained without and with intravenous contrast.  CONTRAST: 17 cc MultiHance  COMPARISON: None.  FINDINGS:  Diffusion imaging does not show any acute or subacute infarction.  The cerebellum is normal. In the substantia nigra region, I believe  there is an abnormal appearance with absence of the  normal swallow  tail appearance. This goes along with the clinical diagnosis.  The cerebral hemispheres show mild age related atrophy with mild  small vessel change in the deep white matter. No cortical or large  vessel territory infarction. No mass lesion, hemorrhage,  hydrocephalus or extra-axial collection.  IMPRESSION:  No reversible intracranial finding. No acute infarction. Minimal  chronic small vessel change of the deep white matter.  Absent swallow tail sign goes along with the clinical diagnosis of  Parkinson's disease.    PREVIOUS MEDICATIONS: none to date  ALLERGIES:   Allergies  Allergen Reactions  . Garlic     nausea  . Niacin     REACTION: flushing    CURRENT MEDICATIONS:  Current Outpatient Prescriptions on File Prior to Visit  Medication  Sig Dispense Refill  . aspirin 81 MG tablet Take 81 mg by mouth daily.        . bimatoprost (LUMIGAN) 0.03 % ophthalmic solution Place 1 drop into both eyes at bedtime.      . carbidopa-levodopa (SINEMET IR) 25-100 MG per tablet Take 1 tablet by mouth 3 (three) times daily.  270 tablet  3  . Cholecalciferol 2000 UNITS CAPS Take by mouth daily.        . Coenzyme Q10 300 MG CAPS Take by mouth daily.        . dorzolamide-timolol (COSOPT) 22.3-6.8 MG/ML ophthalmic solution Place 1 drop into both eyes 2 (two) times daily.       . fish oil-omega-3 fatty acids 1000 MG capsule Take 2 g by mouth daily.        . Flaxseed, Linseed, (FLAX SEED OIL) 1000 MG CAPS Take by mouth daily.        Marland Kitchen levothyroxine (SYNTHROID, LEVOTHROID) 100 MCG tablet Take 1 tablet (100 mcg total) by mouth daily.  90 tablet  3  . simvastatin (ZOCOR) 20 MG tablet Take 1 tablet (20 mg total) by mouth every evening.  90 tablet  3   No current facility-administered medications on file prior to visit.    PAST MEDICAL HISTORY:   Past Medical History  Diagnosis Date  . Arthritis   . Hypercholesteremia   . GERD (gastroesophageal reflux disease)   . Glaucoma     Dr William Williamson  . Hyperthyroidism     s/p RAI  . Anemia 1952    post Oromycin for [place]  . Hearing loss in right ear   . Anisocoria     post op  . Parkinson's disease     PAST SURGICAL HISTORY:   Past Surgical History  Procedure Laterality Date  . Septoplasty  1970  . Cardiac catheterization  07/2006    Dr.  Eustace Williamson, negative  . Vasectomy    . Rai ablation  02/2008    hyperthyroidism  . Cataract extraction, bilateral      OS retinal surgery for post op floaters  . Colonoscopy      negative X 3    SOCIAL HISTORY:   History   Social History  . Marital Status: Widowed    Spouse Name: N/A    Number of Children: N/A  . Years of Education: N/A   Occupational History  . Retired     Probation officer   Social History Main Topics  . Smoking status: Former  Smoker    Quit date: 04/02/1968  . Smokeless tobacco: Never Used  . Alcohol Use: 0.0 oz/week     Comment: 2 beers/day  . Drug Use: No  .  Sexual Activity: Not on file   Other Topics Concern  . Not on file   Social History Narrative  . No narrative on file    FAMILY HISTORY:   Family Status  Relation Status Death Age  . Father Deceased 30    natural causes  . Mother Deceased     "old age"  . Brother Deceased     2, train accident; ? [place]  . Brother Alive   . Sister Alive     3, degen arthritis (bedridden at 80 y/o); dementia  . Child Alive     3, alive and well    ROS:  A complete 10 system review of systems was obtained and was unremarkable apart from what is mentioned above.  PHYSICAL EXAMINATION:    VITALS:   Filed Vitals:   10/06/13 1030  BP: 148/78  Pulse: 60  Resp: 16  Height: 6\' 1"  (1.854 m)  Weight: 187 lb (84.823 kg)    GEN:  The patient appears stated age and is in NAD. HEENT:  Normocephalic, atraumatic.  The mucous membranes are moist. The superficial temporal arteries are without ropiness or tenderness. CV:  RRR Lungs:  CTAB Neck/HEME:  There are no carotid bruits bilaterally.  Neurological examination:  Orientation: The patient is alert and oriented x3. Fund of knowledge is appropriate.  Recent and remote memory are intact.  Attention and concentration are normal.    Able to name objects and repeat phrases. Cranial nerves: There is good facial symmetry.  There is facial hypomimia.   The speech is fluent and clear. Soft palate rises symmetrically and there is no tongue deviation. Hearing is decreased to conversational tone. Motor: Strength is 5/5 in the bilateral upper and lower extremities.   Shoulder shrug is equal and symmetric.  There is no pronator drift.  Movement examination: Tone: There is normal tone in the upper and lower extremities. Abnormal movements: There is intermittent right upper extremity resting tremor, but less frequent than last  visit. Coordination:  There is no significant decremation with rapid alternating movements today. Gait and Station: The patient has no difficulty arising out of a deep-seated chair without the use of the hands. The patient's stride length is normal and he was steady.  There is no tremor with ambulation today.  ASSESSMENT/PLAN:  1.  Parkinsons disease, Hoehn and Yahr stage 2.5  - I am going to have him move his carbidopa/levodopa 25/100 closer together.  Risks, benefits, side effects and alternative therapies were discussed.  The opportunity to ask questions was given and they were answered to the best of my ability.  The patient expressed understanding and willingness to follow the outlined treatment protocols.  -He will continue to exercise  -Asked about DBS surgery.  Patient education was provided.  I don't think that he needs this currently. 2.  Tinnitus  -I told him that this is not associated with PD and is likely due to hearing loss.  Pt admits that this started when began to have problems with hearing 3.  constipation, associated with Parkinson's disease  -A copy of the rancho recipe was given 4.  Return in about 5 months (around 03/08/2014).

## 2013-11-16 ENCOUNTER — Telehealth: Payer: Self-pay

## 2013-11-16 DIAGNOSIS — L570 Actinic keratosis: Secondary | ICD-10-CM

## 2013-11-16 NOTE — Telephone Encounter (Signed)
Caller name:[place]  Relation to pt: Call back [phone] Pharmacy:  Reason for call: William Williamson needs a referral to see his Dermatologist Dr Nevada Crane [phone], he usually sees him every 6 months. [name] now has [facility] now.

## 2013-11-17 NOTE — Telephone Encounter (Signed)
Ref has been put in.      [place]

## 2013-12-21 ENCOUNTER — Encounter: Payer: Self-pay | Admitting: Family Medicine

## 2013-12-21 ENCOUNTER — Ambulatory Visit (INDEPENDENT_AMBULATORY_CARE_PROVIDER_SITE_OTHER): Payer: Commercial Managed Care - HMO | Admitting: Family Medicine

## 2013-12-21 VITALS — BP 176/86 | HR 55 | Temp 97.5°F | Wt 185.2 lb

## 2013-12-21 DIAGNOSIS — E039 Hypothyroidism, unspecified: Secondary | ICD-10-CM

## 2013-12-21 DIAGNOSIS — I10 Essential (primary) hypertension: Secondary | ICD-10-CM

## 2013-12-21 DIAGNOSIS — G20 Parkinson's disease: Secondary | ICD-10-CM

## 2013-12-21 DIAGNOSIS — Z23 Encounter for immunization: Secondary | ICD-10-CM

## 2013-12-21 DIAGNOSIS — G20A1 Parkinson's disease without dyskinesia, without mention of fluctuations: Secondary | ICD-10-CM

## 2013-12-21 DIAGNOSIS — E785 Hyperlipidemia, unspecified: Secondary | ICD-10-CM

## 2013-12-21 LAB — LIPID PANEL
CHOL/HDL RATIO: 3
Cholesterol: 142 mg/dL (ref 0–200)
HDL: 40.8 mg/dL (ref >39.00)
LDL CALC: 80 mg/dL (ref 0–99)
NonHDL: 101.2
TRIGLYCERIDES: 106 mg/dL (ref 0.0–149.0)
VLDL: 21.2 mg/dL (ref 0.0–40.0)

## 2013-12-21 LAB — BASIC METABOLIC PANEL
BUN: 11 mg/dL (ref 6–23)
CHLORIDE: 101 meq/L (ref 96–112)
CO2: 28 mEq/L (ref 19–32)
CREATININE: 1 mg/dL (ref 0.4–1.5)
Calcium: 9.1 mg/dL (ref 8.4–10.5)
GFR: 74.93 mL/min (ref >60.00)
Glucose, Bld: 86 mg/dL (ref 70–99)
POTASSIUM: 5.3 meq/L — AB (ref 3.5–5.1)
Sodium: 137 mEq/L (ref 135–145)

## 2013-12-21 LAB — HEPATIC FUNCTION PANEL
ALT: 6 U/L (ref 0–53)
AST: 23 U/L (ref 0–37)
Albumin: 4.4 g/dL (ref 3.5–5.2)
Alkaline Phosphatase: 42 U/L (ref 39–117)
BILIRUBIN TOTAL: 0.8 mg/dL (ref 0.2–1.2)
Bilirubin, Direct: 0.1 mg/dL (ref 0.0–0.3)
Total Protein: 7.4 g/dL (ref 6.0–8.3)

## 2013-12-21 LAB — TSH: TSH: 0.2 u[IU]/mL — ABNORMAL LOW (ref 0.35–4.50)

## 2013-12-21 NOTE — Progress Notes (Signed)
Subjective:    Patient here for follow-up of elevated blood pressure.  He is exercising and is adherent to a low-salt diet.  Blood pressure is well controlled at home. Cardiac symptoms: none. Patient denies: chest pain, chest pressure/discomfort, claudication, dyspnea, exertional chest pressure/discomfort, fatigue, irregular heart beat, lower extremity edema, near-syncope, orthopnea, palpitations, paroxysmal nocturnal dyspnea, syncope and tachypnea. Cardiovascular risk factors: dyslipidemia and hypertension. Use of agents associated with hypertension: none. History of target organ damage: none.  The following portions of the patient's history were reviewed and updated as appropriate:  He  has a past medical history of Arthritis; Hypercholesteremia; GERD (gastroesophageal reflux disease); Glaucoma; Hyperthyroidism; Anemia (1952); Hearing loss in right ear; Anisocoria; and Parkinson's disease. He  does not have any pertinent problems on file. He  has past surgical history that includes Septoplasty (1970); Cardiac catheterization (07/2006); Vasectomy; RAI ablation (02/2008); Cataract extraction, bilateral; and Colonoscopy. His family history includes Diabetes in his brother; Heart attack (age of onset: 66) in his maternal grandfather; Hypertension in his brother and sister; Osteoporosis in his mother; Pancreatic cancer in his brother; Prostate cancer in his brother; Stomach cancer in his maternal grandmother. There is no history of Stroke. He  reports that he quit smoking about 45 years ago. He has never used smokeless tobacco. He reports that he drinks alcohol. He reports that he does not use illicit drugs. He has a current medication list which includes the following prescription(s): aspirin, bimatoprost, carbidopa-levodopa, cholecalciferol, coenzyme q10, dorzolamide-timolol, fish oil-omega-3 fatty acids, flax seed oil, levothyroxine, and simvastatin. Current Outpatient Prescriptions on File Prior to  Visit  Medication Sig Dispense Refill  . aspirin 81 MG tablet Take 81 mg by mouth daily.        . bimatoprost (LUMIGAN) 0.03 % ophthalmic solution Place 1 drop into both eyes at bedtime.      . carbidopa-levodopa (SINEMET IR) 25-100 MG per tablet Take 1 tablet by mouth 3 (three) times daily.  270 tablet  3  . Cholecalciferol 2000 UNITS CAPS Take by mouth daily.        . Coenzyme Q10 300 MG CAPS Take by mouth daily.        . dorzolamide-timolol (COSOPT) 22.3-6.8 MG/ML ophthalmic solution Place 1 drop into both eyes 2 (two) times daily.       . fish oil-omega-3 fatty acids 1000 MG capsule Take 2 g by mouth daily.        . Flaxseed, Linseed, (FLAX SEED OIL) 1000 MG CAPS Take by mouth daily.        Marland Kitchen levothyroxine (SYNTHROID, LEVOTHROID) 100 MCG tablet Take 1 tablet (100 mcg total) by mouth daily.  90 tablet  3  . simvastatin (ZOCOR) 20 MG tablet Take 1 tablet (20 mg total) by mouth every evening.  90 tablet  3   No current facility-administered medications on file prior to visit.   He is allergic to garlic and niacin..  Review of Systems Pertinent items are noted in HPI.     Objective:    BP 176/86  Pulse 55  Temp(Src) 97.5 F (36.4 C) (Oral)  Wt 185 lb 3 oz (84 kg)  SpO2 98% General appearance: alert, cooperative, appears stated age and no distress Lungs: clear to auscultation bilaterally Heart: S1, S2 normal Extremities: extremities normal, atraumatic, no cyanosis or edema    Assessment:    Hypertension, elevated today -repeat did come down 150/88. Evidence of target organ damage: none.    Plan:    Medication: no change. Dietary  sodium restriction. Follow up: 3 months and as needed.  1. Need for prophylactic vaccination and inoculation against influenza   - Flu Vaccine QUAD 36+ mos PF IM (Fluarix Quad PF)  2. Unspecified hypothyroidism Check labs - TSH  3. Essential hypertension Check lab, con't meds---pt states home bp are in normal range.   He will start taking  bp couple times a week and send them to [place] - Basic metabolic panel  4. Other and unspecified hyperlipidemia Check labs - Hepatic function panel - Lipid panel  5. Parkinson disease Per neuro

## 2013-12-21 NOTE — Progress Notes (Signed)
Pre visit review using our clinic review tool, if applicable. No additional management support is needed unless otherwise documented below in the visit note. 

## 2013-12-21 NOTE — Patient Instructions (Signed)

## 2013-12-25 ENCOUNTER — Other Ambulatory Visit: Payer: Self-pay

## 2013-12-25 MED ORDER — LEVOTHYROXINE SODIUM 88 MCG PO TABS: 88.0000 ug | ORAL_TABLET | Freq: Every day | ORAL | Status: DC

## 2013-12-29 ENCOUNTER — Other Ambulatory Visit (INDEPENDENT_AMBULATORY_CARE_PROVIDER_SITE_OTHER): Payer: Commercial Managed Care - HMO

## 2013-12-29 DIAGNOSIS — E875 Hyperkalemia: Secondary | ICD-10-CM

## 2013-12-29 LAB — BASIC METABOLIC PANEL
BUN: 11 mg/dL (ref 6–23)
CO2: 29 meq/L (ref 19–32)
CREATININE: 1.1 mg/dL (ref 0.4–1.5)
Calcium: 9.4 mg/dL (ref 8.4–10.5)
Chloride: 102 mEq/L (ref 96–112)
GFR: 71.67 mL/min (ref >60.00)
GLUCOSE: 100 mg/dL — AB (ref 70–99)
Potassium: 5.6 mEq/L — ABNORMAL HIGH (ref 3.5–5.1)
SODIUM: 136 meq/L (ref 135–145)

## 2013-12-31 ENCOUNTER — Ambulatory Visit (INDEPENDENT_AMBULATORY_CARE_PROVIDER_SITE_OTHER): Payer: Commercial Managed Care - HMO | Admitting: Family Medicine

## 2013-12-31 ENCOUNTER — Encounter: Payer: Self-pay | Admitting: Family Medicine

## 2013-12-31 VITALS — BP 161/98 | HR 58 | Temp 98.5°F | Wt 185.0 lb

## 2013-12-31 DIAGNOSIS — IMO0002 Reserved for concepts with insufficient information to code with codable children: Secondary | ICD-10-CM

## 2013-12-31 DIAGNOSIS — I10 Essential (primary) hypertension: Secondary | ICD-10-CM

## 2013-12-31 DIAGNOSIS — E875 Hyperkalemia: Secondary | ICD-10-CM

## 2013-12-31 DIAGNOSIS — R031 Nonspecific low blood-pressure reading: Secondary | ICD-10-CM

## 2013-12-31 LAB — EKG 12-LEAD

## 2013-12-31 LAB — POCT URINALYSIS DIPSTICK
Bilirubin, UA: NEGATIVE
Blood, UA: NEGATIVE
GLUCOSE UA: NEGATIVE
Ketones, UA: NEGATIVE
Leukocytes, UA: NEGATIVE
NITRITE UA: NEGATIVE
Protein, UA: NEGATIVE
Spec Grav, UA: 1.015
Urobilinogen, UA: 0.2
pH, UA: 6

## 2013-12-31 NOTE — Progress Notes (Signed)
   Subjective:    Patient ID: William Williamson, male    DOB: 12-Apr-1932, 80 y.o.   MRN: [id number]  HPI Pt here c/o low bp with exercise and high with rest.  bp as low as 110/70 with exerciese and 170/90 at rest.  Pt was put on ACEI in past and it  Lowered bp too much so it was stopped.  Pt also needs his K rechecked and kidney function.   Review of Systems As above     Objective:   Physical Exam BP 161/98  Pulse 58  Temp(Src) 98.5 F (36.9 C) (Oral)  Wt 185 lb (83.915 kg)  SpO2 96% General appearance: alert, cooperative, appears stated age and no distress Lungs: clear to auscultation bilaterally Heart: S1, S2 normal Extremities: extremities normal, atraumatic, no cyanosis or edema   [id number]     Assessment & Plan:  1. Hyperkalemia Repeat today,   - EKG [id number] - Basic metabolic panel - CBC with Differential - POCT urinalysis dipstick  2. Essential hypertension   3. Abnormal blood pressure bp running low with exercise and high at rest per pt - Ambulatory referral to Cardiology

## 2013-12-31 NOTE — Progress Notes (Signed)
Pre visit review using our clinic review tool, if applicable. No additional management support is needed unless otherwise documented below in the visit note. 

## 2013-12-31 NOTE — Patient Instructions (Signed)
Hyperkalemia Hyperkalemia is when you have too much potassium in your blood. This can be a life-threatening condition. Potassium is normally removed (excreted) from the body by the kidneys. CAUSES  The potassium level in your body can become too high for the following reasons:  You take in too much potassium. You can do this by:  Using salt substitutes. They contain large amounts of potassium.  Taking potassium supplements from your caregiver. The dose may be too high for you.  Eating foods or taking nutritional products with potassium.  You excrete too little potassium. This can happen if:  Your kidneys are not functioning properly. Kidney (renal) disease is a very common cause of hyperkalemia.  You are taking medicines that lower your excretion of potassium, such as certain diuretic medicines.  You have an adrenal gland disease called Addison's disease.  You have a urinary tract obstruction, such as kidney stones.  You are on treatment to mechanically clean your blood (dialysis) and you skip a treatment.  You release a high amount of potassium from your cells into your blood. You may have a condition that causes potassium to move from your cells to your bloodstream. This can happen with:  Injury to muscles or other tissues. Most potassium is stored in the muscles.  Severe burns or infections.  Acidic blood plasma (acidosis). Acidosis can result from many diseases, such as uncontrolled diabetes. SYMPTOMS  Usually, there are no symptoms unless the potassium is dangerously high or has risen very quickly. Symptoms may include:  Irregular or very slow heartbeat.  Feeling sick to your stomach (nauseous).  Tiredness (fatigue).  Nerve problems such as tingling of the skin, numbness of the hands or feet, weakness, or paralysis. DIAGNOSIS  A simple blood test can measure the amount of potassium in your body. An electrocardiogram test of the heart can also help make the diagnosis.  The heart may beat dangerously fast or slow down and stop beating with severe hyperkalemia.  TREATMENT  Treatment depends on how bad the condition is and on the underlying cause.  If the hyperkalemia is an emergency (causing heart problems or paralysis), many different medicines can be used alone or together to lower the potassium level briefly. This may include an insulin injection even if you are not diabetic. Emergency dialysis may be needed to remove potassium from the body.  If the hyperkalemia is less severe or dangerous, the underlying cause is treated. This can include taking medicines if needed. Your prescription medicines may be changed. You may also need to take a medicine to help your body get rid of potassium. You may need to eat a diet low in potassium. HOME CARE INSTRUCTIONS   Take medicines and supplements as directed by your caregiver.  Do not take any over-the-counter medicines, supplements, natural products, herbs, or vitamins without reviewing them with your caregiver. Certain supplements and natural food products can have high amounts of potassium. Other products (such as ibuprofen) can damage weak kidneys and raise your potassium.  You may be asked to do repeat lab tests. Be sure to follow these directions.  If you have kidney disease, you may need to follow a low potassium diet. SEEK MEDICAL CARE IF:   You notice an irregular or very slow heartbeat.  You feel lightheaded.  You develop weakness that is unusual for you. SEEK IMMEDIATE MEDICAL CARE IF:   You have shortness of breath.  You have chest discomfort.  You pass out (faint). MAKE SURE YOU:   Understand   these instructions.  Will watch your condition.  Will get help right away if you are not doing well or get worse. Document Released: 03/09/2002 Document Revised: 06/11/2011 Document Reviewed: 06/24/2013 Exit[employer] Patient Information 2015 [employer]. This information is not intended to replace  advice given to you by your health care provider. Make sure you discuss any questions you have with your health care provider.  

## 2014-01-01 ENCOUNTER — Other Ambulatory Visit: Payer: Self-pay | Admitting: Family Medicine

## 2014-01-01 DIAGNOSIS — E875 Hyperkalemia: Secondary | ICD-10-CM

## 2014-01-01 LAB — CBC WITH DIFFERENTIAL/PLATELET
BASOS ABS: 0 10*3/uL (ref 0.0–0.1)
Basophils Relative: 0.3 % (ref 0.0–3.0)
Eosinophils Absolute: 0.2 10*3/uL (ref 0.0–0.7)
Eosinophils Relative: 3.1 % (ref 0.0–5.0)
HCT: 44 % (ref 39.0–52.0)
HEMOGLOBIN: 14.6 g/dL (ref 13.0–17.0)
LYMPHS PCT: 25.5 % (ref 12.0–46.0)
Lymphs Abs: 2 10*3/uL (ref 0.7–4.0)
MCHC: 33.2 g/dL (ref 30.0–36.0)
MCV: 96 fl (ref 78.0–100.0)
MONOS PCT: 6.8 % (ref 3.0–12.0)
Monocytes Absolute: 0.5 10*3/uL (ref 0.1–1.0)
Neutro Abs: 5 10*3/uL (ref 1.4–7.7)
Neutrophils Relative %: 64.3 % (ref 43.0–77.0)
Platelets: 226 10*3/uL (ref 150.0–400.0)
RBC: 4.58 Mil/uL (ref 4.22–5.81)
RDW: 12.7 % (ref 11.5–15.5)
WBC: 7.8 10*3/uL (ref 4.0–10.5)

## 2014-01-01 LAB — BASIC METABOLIC PANEL
BUN: 14 mg/dL (ref 6–23)
CALCIUM: 9.6 mg/dL (ref 8.4–10.5)
CO2: 30 mEq/L (ref 19–32)
CREATININE: 1 mg/dL (ref 0.4–1.5)
Chloride: 98 mEq/L (ref 96–112)
GFR: 81.33 mL/min (ref >60.00)
GLUCOSE: 82 mg/dL (ref 70–99)
Potassium: 5.3 mEq/L — ABNORMAL HIGH (ref 3.5–5.1)
Sodium: 134 mEq/L — ABNORMAL LOW (ref 135–145)

## 2014-02-08 ENCOUNTER — Ambulatory Visit (INDEPENDENT_AMBULATORY_CARE_PROVIDER_SITE_OTHER): Payer: Commercial Managed Care - HMO | Admitting: Cardiology

## 2014-02-08 ENCOUNTER — Encounter: Payer: Self-pay | Admitting: Cardiology

## 2014-02-08 VITALS — BP 142/100 | HR 60 | Ht 73.0 in | Wt 189.0 lb

## 2014-02-08 DIAGNOSIS — I10 Essential (primary) hypertension: Secondary | ICD-10-CM

## 2014-02-08 DIAGNOSIS — E785 Hyperlipidemia, unspecified: Secondary | ICD-10-CM | POA: Insufficient documentation

## 2014-02-08 NOTE — Progress Notes (Signed)
Patient ID: William Williamson, male   DOB: 05/07/1932, 80 y.o.   MRN: [id number]    Patient Name: William Williamson Date of Encounter: 02/08/2014  Primary Care Provider:  Garnet Koyanagi, DO Primary Cardiologist:  Dorothy Spark  Problem List   Past Medical History  Diagnosis Date  . Arthritis   . Hypercholesteremia   . GERD (gastroesophageal reflux disease)   . Glaucoma     Dr Bing Plume  . Hyperthyroidism     s/p RAI  . Anemia 1952    post Oromycin for [place]mia  . Hearing loss in right ear   . Anisocoria     post op  . Parkinson's disease    Past Surgical History  Procedure Laterality Date  . Septoplasty  1970  . Cardiac catheterization  07/2006    Dr.  Eustace Quail, negative  . Vasectomy    . Rai ablation  02/2008    hyperthyroidism  . Cataract extraction, bilateral      OS retinal surgery for post op floaters  . Colonoscopy      negative X 3    Allergies  Allergies  Allergen Reactions  . Garlic     nausea  . Niacin     REACTION: flushing    HPI  A pleasant 80 year old with history of hypertension, hyperlipidemia, hearing loss and Parkinson's disease who is being referred by his primary care physician for a lot of bile blood pressure. Patient was diagnosed with high blood pressure and started on ramipril however his blood pressure was very low and he felt dizzy so he discontinued. He states that when he is active his blood pressure goes down and is normal and his heart times a day taking any medicines. He is able to work in his yard for about 4 hours a day without any shortness of breath or chest pain. He feels dizzy occasionally. No palpitations or syncope. No lower extremity edema. No claudications.  Home Medications  Prior to Admission medications   Medication Sig Start Date End Date Taking? Authorizing Provider  aspirin 81 MG tablet Take 81 mg by mouth daily.     Yes Historical Provider, MD  bimatoprost (LUMIGAN) 0.03 % ophthalmic solution Place 1  drop into both eyes at bedtime.   Yes Historical Provider, MD  carbidopa-levodopa (SINEMET IR) 25-100 MG per tablet Take 1 tablet by mouth 3 (three) times daily. 07/28/13  Yes Rebecca S Tat, DO  Cholecalciferol 2000 UNITS CAPS Take by mouth daily.     Yes Historical Provider, MD  Coenzyme Q10 300 MG CAPS Take by mouth daily.     Yes Historical Provider, MD  dorzolamide-timolol (COSOPT) 22.3-6.8 MG/ML ophthalmic solution Place 1 drop into both eyes 2 (two) times daily.  05/14/13  Yes Historical Provider, MD  fish oil-omega-3 fatty acids 1000 MG capsule Take 2 g by mouth daily.     Yes Historical Provider, MD  Flaxseed, Linseed, (FLAX SEED OIL) 1000 MG CAPS Take by mouth daily.     Yes Historical Provider, MD  levothyroxine (SYNTHROID, LEVOTHROID) 88 MCG tablet Take 1 tablet (88 mcg total) by mouth daily. 12/25/13  Yes Rosalita Chessman, DO  simvastatin (ZOCOR) 20 MG tablet Take 1 tablet (20 mg total) by mouth every evening. 06/19/13  Yes Rosalita Chessman, DO    Family History  Family History  Problem Relation Age of Onset  . Osteoporosis Mother   . Hypertension Sister   . Hypertension Brother  X 3  . Diabetes Brother   . Prostate cancer Brother   . Stomach cancer Maternal Grandmother   . Pancreatic cancer Brother     ???  . Heart attack Maternal Grandfather 25  . Stroke Neg Hx     Social History  History   Social History  . Marital Status: Widowed    Spouse Name: N/A    Number of Children: N/A  . Years of Education: N/A   Occupational History  . Retired     Probation officer   Social History Main Topics  . Smoking status: Former Smoker    Quit date: 04/02/1968  . Smokeless tobacco: Never Used  . Alcohol Use: 0.0 oz/week     Comment: 2 beers/day  . Drug Use: No  . Sexual Activity: Not on file   Other Topics Concern  . Not on file   Social History Narrative     Review of Systems, as per HPI, otherwise negative General:  No chills, fever, night sweats or weight changes.    Cardiovascular:  No chest pain, dyspnea on exertion, edema, orthopnea, palpitations, paroxysmal nocturnal dyspnea. Dermatological: No rash, lesions/masses Respiratory: No cough, dyspnea Urologic: No hematuria, dysuria Abdominal:   No nausea, vomiting, diarrhea, bright red blood per rectum, melena, or hematemesis Neurologic:  No visual changes, wkns, changes in mental status. All other systems reviewed and are otherwise negative except as noted above.  Physical Exam  Blood pressure 142/100, pulse 60, height 6\' 1"  (1.854 m), weight 189 lb (85.73 kg), SpO2 98 %.  General: Pleasant, NAD Psych: Normal affect. Neuro: Alert and oriented X 3. Moves all extremities spontaneously. HEENT: Normal  Neck: Supple without bruits or JVD. Lungs:  Resp regular and unlabored, CTA. Heart: RRR no s3, s4, or murmurs. Abdomen: Soft, non-tender, non-distended, BS + x 4.  Extremities: No clubbing, cyanosis or edema. DP/PT/Radials 2+ and equal bilaterally.  Labs:  No results for input(s): CKTOTAL, CKMB, TROPONINI in the last 72 hours. Lab Results  Component Value Date   WBC 7.8 12/31/2013   HGB 14.6 12/31/2013   HCT 44.0 12/31/2013   MCV 96.0 12/31/2013   PLT 226.0 12/31/2013    No results found for: DDIMER Invalid input(s): POCBNP    Component Value Date/Time   NA 134* 12/31/2013 1532   K 5.3* 12/31/2013 1532   CL 98 12/31/2013 1532   CO2 30 12/31/2013 1532   GLUCOSE 82 12/31/2013 1532   BUN 14 12/31/2013 1532   CREATININE 1.0 12/31/2013 1532   CALCIUM 9.6 12/31/2013 1532   PROT 7.4 12/21/2013 1010   ALBUMIN 4.4 12/21/2013 1010   AST 23 12/21/2013 1010   ALT 6 12/21/2013 1010   ALKPHOS 42 12/21/2013 1010   BILITOT 0.8 12/21/2013 1010   GFRNONAA 62.87 06/02/2009 1244   GFRAA 94 01/09/2008 0000   Lab Results  Component Value Date   CHOL 142 12/21/2013   HDL 40.80 12/21/2013   LDLCALC 80 12/21/2013   TRIG 106.0 12/21/2013    Accessory Clinical Findings  Echocardiogram -  none  ECG - sinus rhythm, normal EKG.    Assessment & Plan  80 year old male  1. Hypertension - repeat blood pressure 140/92, patient never had an echocardiogram, we will order one to evaluate for systolic and diastolics function and degree of LVH. We'll treat based on the results.  2. Hyperlipidemia - on simvastatin 20, all lipids are at goal. He is compliant with his medicines and has no side effects liver function tests were normal.  Follow-up in 2 months.    Dorothy Spark, MD, [facility] 02/08/2014, 9:09 AM

## 2014-02-08 NOTE — Patient Instructions (Signed)
Your physician recommends that you continue on your current medications as directed. Please refer to the Current Medication list given to you today.   Your physician has requested that you have an echocardiogram. Echocardiography is a painless test that uses sound waves to create images of your heart. It provides your doctor with information about the size and shape of your heart and how well your heart's chambers and valves are working. This procedure takes approximately one hour. There are no restrictions for this procedure.    Your physician recommends that you schedule a follow-up appointment in: [place]

## 2014-02-10 ENCOUNTER — Ambulatory Visit (HOSPITAL_COMMUNITY): Payer: Medicare HMO | Attending: Cardiology | Admitting: Cardiology

## 2014-02-10 DIAGNOSIS — I10 Essential (primary) hypertension: Secondary | ICD-10-CM | POA: Insufficient documentation

## 2014-02-10 DIAGNOSIS — E785 Hyperlipidemia, unspecified: Secondary | ICD-10-CM | POA: Diagnosis not present

## 2014-02-10 DIAGNOSIS — Z87891 Personal history of nicotine dependence: Secondary | ICD-10-CM | POA: Diagnosis not present

## 2014-02-10 NOTE — Progress Notes (Signed)
Echo performed. 

## 2014-02-12 ENCOUNTER — Other Ambulatory Visit: Payer: Self-pay

## 2014-02-12 MED ORDER — AMLODIPINE BESYLATE 2.5 MG PO TABS: 2.5000 mg | ORAL_TABLET | Freq: Every day | ORAL | Status: DC

## 2014-03-11 ENCOUNTER — Telehealth: Payer: Self-pay | Admitting: Family Medicine

## 2014-03-11 MED ORDER — LEVOTHYROXINE SODIUM 88 MCG PO TABS: 88.0000 ug | ORAL_TABLET | Freq: Every day | ORAL | Status: DC

## 2014-03-11 NOTE — Telephone Encounter (Signed)
Rx faxed.    KP 

## 2014-03-11 NOTE — Telephone Encounter (Signed)
Caller name: Barth Relation to pt: self Call back number: [phone] [facility]acy: [facility] [address]  Reason for call:   Requesting refill of levothyroxine

## 2014-04-07 ENCOUNTER — Telehealth: Payer: Self-pay | Admitting: Cardiology

## 2014-04-07 MED ORDER — LISINOPRIL 2.5 MG PO TABS: 2.5000 mg | ORAL_TABLET | Freq: Every day | ORAL | Status: DC

## 2014-04-07 NOTE — Telephone Encounter (Signed)
New Msg      Pt c/o medication issue: 1. Name of Medication: odipine besylate  2. How are you currently taking this medication (dosage and times per day)     2.5mg  once a day  3. Are you having a reaction (difficulty breathing--STAT)?  No 4. What is your medication issue?  Pt states he feels dizzy when he takes medication and extremely tired.    BP 87/56 as of yesterday. Pt stop taking med. Please call.

## 2014-04-07 NOTE — Telephone Encounter (Signed)
Pt calling to inform Dr Meda Coffee that when he takes his amlodipine it causes him to become very weak, tired, and dizzy.  Pt states the amlodipine is causing this, even though he has been taking this med for 2 months now, with no complaints. Pt states symptoms did not start until he went on vacation to [place] a week and half ago.  Pt states he also abruptly stopped drinking while on vacation, which he felt added to his symptoms.  Pt state he drank at least 5-6 beers a day and quit abruptly.  Pt states he took himself off the amlodipine and symptoms subsided. Pt would like for Dr Meda Coffee to advise on what he should take for his BP.  Informed the pt that  I will route this message to Dr Meda Coffee for further review and recommendation and follow-up thereafter.

## 2014-04-07 NOTE — Telephone Encounter (Signed)
He should discontinue and start lisinopril 2.5 mg po daily.

## 2014-04-07 NOTE — Telephone Encounter (Signed)
Contacted the pt to inform him that per Dr Meda Coffee he should discontinue his amlodipine and start taking Lisinopril 2.5 mg po once daily.  Confirmed the pharmacy of choice with the pt.  Pt verbalized understanding and agrees with this plan.

## 2014-04-08 ENCOUNTER — Ambulatory Visit (INDEPENDENT_AMBULATORY_CARE_PROVIDER_SITE_OTHER): Payer: PPO | Admitting: Neurology

## 2014-04-08 ENCOUNTER — Encounter: Payer: Self-pay | Admitting: Neurology

## 2014-04-08 VITALS — BP 132/80 | HR 60 | Ht 73.0 in | Wt 186.0 lb

## 2014-04-08 DIAGNOSIS — R42 Dizziness and giddiness: Secondary | ICD-10-CM

## 2014-04-08 DIAGNOSIS — G20 Parkinson's disease: Secondary | ICD-10-CM
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
## 2014-04-14 ENCOUNTER — Ambulatory Visit: Payer: Self-pay | Admitting: Cardiology

## 2014-04-14 ENCOUNTER — Encounter: Payer: Self-pay | Admitting: Cardiology

## 2014-04-14 ENCOUNTER — Ambulatory Visit (INDEPENDENT_AMBULATORY_CARE_PROVIDER_SITE_OTHER): Payer: PPO | Admitting: Cardiology

## 2014-04-14 VITALS — BP 140/60 | HR 95 | Ht 73.0 in | Wt 187.0 lb

## 2014-04-14 DIAGNOSIS — I10 Essential (primary) hypertension: Secondary | ICD-10-CM

## 2014-04-14 DIAGNOSIS — E785 Hyperlipidemia, unspecified: Secondary | ICD-10-CM

## 2014-04-14 MED ORDER — LISINOPRIL 2.5 MG PO TABS: 2.5000 mg | ORAL_TABLET | Freq: Every day | ORAL | Status: DC

## 2014-04-14 NOTE — Patient Instructions (Addendum)
Your physician recommends that you continue on your current medications as directed. Please refer to the Current Medication list given to you today.    Your physician recommends that you return for lab work in: [place] ([facility])  05/05/2014   Your physician wants you to follow-up in: [place] will receive a reminder letter in the mail two months in advance. If you don't receive a letter, please call our office to schedule the follow-up appointment.

## 2014-04-14 NOTE — Progress Notes (Signed)
Patient ID: William Williamson, male   DOB: 25-Sep-1932, 81 y.o.   MRN: [id number]    Patient Name: William Williamson Date of Encounter: 04/14/2014  Primary Care Provider:  Garnet Koyanagi, DO Primary Cardiologist:  Dorothy Spark  Problem List   Past Medical History  Diagnosis Date  . Arthritis   . Hypercholesteremia   . GERD (gastroesophageal reflux disease)   . Glaucoma     Dr Bing Plume  . Hyperthyroidism     s/p RAI  . Anemia 1952    post Oromycin for Tularemia  . Hearing loss in right ear   . Anisocoria     post op  . Parkinson's disease    Past Surgical History  Procedure Laterality Date  . Septoplasty  1970  . Cardiac catheterization  07/2006    Dr.  Eustace Quail, negative  . Vasectomy    . Rai ablation  02/2008    hyperthyroidism  . Cataract extraction, bilateral      OS retinal surgery for post op floaters  . Colonoscopy      negative X 3    Allergies  Allergies  Allergen Reactions  . Garlic     nausea  . Niacin     REACTION: flushing    HPI  A pleasant 81 year old with history of hypertension, hyperlipidemia, hearing loss and Parkinson's disease who is being referred by his primary care physician for a high blood pressure. Patient was diagnosed with high blood pressure and started on ramipril however his blood pressure was very low and he felt dizzy so he discontinued. He states that when he is active his blood pressure goes down and is normal and his heart times a day taking any medicines. He is able to work in his yard for about 4 hours a day without any shortness of breath or chest pain. He feels dizzy occasionally. No palpitations or syncope. No lower extremity edema. No claudications.  04/17/2014 - patient is coming after 2 months. He underwent echocardiogram that showed mild concentric LVH with moderate distal septal hypertrophy. He was started on amlodipine 2.5 mg daily. The patient states that he was tolerating it well at first but afterwards we  will all on on episodes of dizziness and eye fatigue and he stopped taking it. We prescribed lisinopril 2.5 instead he so far tolerating it very well. He denies any chest pain, lower extremity edema or shortness of breath.  Home Medications  Prior to Admission medications   Medication Sig Start Date End Date Taking? Authorizing Provider  aspirin 81 MG tablet Take 81 mg by mouth daily.     Yes Historical Provider, MD  bimatoprost (LUMIGAN) 0.03 % ophthalmic solution Place 1 drop into both eyes at bedtime.   Yes Historical Provider, MD  carbidopa-levodopa (SINEMET IR) 25-100 MG per tablet Take 1 tablet by mouth 3 (three) times daily. 07/28/13  Yes Rebecca S Tat, DO  Cholecalciferol 2000 UNITS CAPS Take by mouth daily.     Yes Historical Provider, MD  Coenzyme Q10 300 MG CAPS Take by mouth daily.     Yes Historical Provider, MD  dorzolamide-timolol (COSOPT) 22.3-6.8 MG/ML ophthalmic solution Place 1 drop into both eyes 2 (two) times daily.  05/14/13  Yes Historical Provider, MD  fish oil-omega-3 fatty acids 1000 MG capsule Take 2 g by mouth daily.     Yes Historical Provider, MD  Flaxseed, Linseed, (FLAX SEED OIL) 1000 MG CAPS Take by mouth daily.     Yes Historical  Provider, MD  levothyroxine (SYNTHROID, LEVOTHROID) 88 MCG tablet Take 1 tablet (88 mcg total) by mouth daily. 12/25/13  Yes Rosalita Chessman, DO  simvastatin (ZOCOR) 20 MG tablet Take 1 tablet (20 mg total) by mouth every evening. 06/19/13  Yes Rosalita Chessman, DO    Family History  Family History  Problem Relation Age of Onset  . Osteoporosis Mother   . Hypertension Sister   . Hypertension Brother      X 3  . Diabetes Brother   . Prostate cancer Brother   . Stomach cancer Maternal Grandmother   . Pancreatic cancer Brother     ???  . Heart attack Maternal Grandfather 52  . Stroke Neg Hx     Social History  History   Social History  . Marital Status: Widowed    Spouse Name: N/A    Number of Children: N/A  . Years of  Education: N/A   Occupational History  . Retired     Probation officer   Social History Main Topics  . Smoking status: Former Smoker    Quit date: 04/02/1968  . Smokeless tobacco: Never Used  . Alcohol Use: 0.0 oz/week     Comment: 2 beers/day  . Drug Use: No  . Sexual Activity: Not on file   Other Topics Concern  . Not on file   Social History Narrative     Review of Systems, as per HPI, otherwise negative General:  No chills, fever, night sweats or weight changes.  Cardiovascular:  No chest pain, dyspnea on exertion, edema, orthopnea, palpitations, paroxysmal nocturnal dyspnea. Dermatological: No rash, lesions/masses Respiratory: No cough, dyspnea Urologic: No hematuria, dysuria Abdominal:   No nausea, vomiting, diarrhea, bright red blood per rectum, melena, or hematemesis Neurologic:  No visual changes, wkns, changes in mental status. All other systems reviewed and are otherwise negative except as noted above.  Physical Exam  Blood pressure 140/60, pulse 95, height 6\' 1"  (1.854 m), weight 187 lb (84.823 kg).  General: Pleasant, NAD Psych: Normal affect. Neuro: Alert and oriented X 3. Moves all extremities spontaneously. HEENT: Normal  Neck: Supple without bruits or JVD. Lungs:  Resp regular and unlabored, CTA. Heart: RRR no s3, s4, or murmurs. Abdomen: Soft, non-tender, non-distended, BS + x 4.  Extremities: No clubbing, cyanosis or edema. DP/PT/Radials 2+ and equal bilaterally.  Labs:  No results for input(s): CKTOTAL, CKMB, TROPONINI in the last 72 hours. Lab Results  Component Value Date   WBC 7.8 12/31/2013   HGB 14.6 12/31/2013   HCT 44.0 12/31/2013   MCV 96.0 12/31/2013   PLT 226.0 12/31/2013    No results found for: DDIMER Invalid input(s): POCBNP    Component Value Date/Time   NA 134* 12/31/2013 1532   K 5.3* 12/31/2013 1532   CL 98 12/31/2013 1532   CO2 30 12/31/2013 1532   GLUCOSE 82 12/31/2013 1532   BUN 14 12/31/2013 1532   CREATININE 1.0  12/31/2013 1532   CALCIUM 9.6 12/31/2013 1532   PROT 7.4 12/21/2013 1010   ALBUMIN 4.4 12/21/2013 1010   AST 23 12/21/2013 1010   ALT 6 12/21/2013 1010   ALKPHOS 42 12/21/2013 1010   BILITOT 0.8 12/21/2013 1010   GFRNONAA 62.87 06/02/2009 1244   GFRAA 94 01/09/2008 0000   Lab Results  Component Value Date   CHOL 142 12/21/2013   HDL 40.80 12/21/2013   LDLCALC 80 12/21/2013   TRIG 106.0 12/21/2013    Accessory Clinical Findings  Echocardiogram - 02/10/2014  -  Left ventricle: The cavity size was normal. Wall thickness was increased in a pattern of mild LVH. There was moderate focal basal hypertrophy of the septum. Systolic function was normal. The estimated ejection fraction was in the range of 60% to 65%. Wall motion was normal; there were no regional wall motion abnormalities. Doppler parameters are consistent with abnormal left ventricular relaxation (grade 1 diastolic dysfunction). The E/e &' ratio is between 8-15, suggesting indeterminate LV filling pressure. - Aortic valve: Poorly visualized. Sclerosis without stenosis. There was mild regurgitation. - Aorta: Aortic root dimension: 42 mm (ED). - Aortic root: The aortic root is dilated. - Mitral valve: Posterior calcified annulus. There was mild regurgitation. - Atrial septum: Possibly aneurysmal, not well visualzied. No indication of PFO by color doppler. - Tricuspid valve: There was mild regurgitation. - Pulmonary arteries: PA peak pressure: 23 mm Hg (S).  Impressions:  - LVEF 60-65%, mild LVH with moderate focal basal septal hypertrophy, aortic valve sclerosis, dilated sinotubular junction measuring 4.2 cm, mild central AI, posterior MAC, mild MR.  ECG - sinus rhythm, normal EKG.    Assessment & Plan  81 year old male  1. Hypertension - mild LVH with moderate focal basal septal hypertrophy on echocardiogram. Patient didn't tolerate amlodipine 2.5 mg daily as these lower his  blood pressure significantly. We started him on lisinopril 2.5 mg daily. We will follow patient in 3 months on to see how her his symptoms and how is his blood pressure. We will check his basic metabolic profile to check for his electrolytes and kidney function in 3 weeks.  2. Hyperlipidemia - on simvastatin 20, all lipids are at goal. He is compliant with his medicines and has no side effects liver function tests were normal.  Follow-up in 2 months.    Dorothy Spark, MD, [facility] 04/14/2014, 9:44 AM

## 2014-04-19 ENCOUNTER — Ambulatory Visit (INDEPENDENT_AMBULATORY_CARE_PROVIDER_SITE_OTHER): Payer: PPO | Admitting: Family Medicine

## 2014-04-19 ENCOUNTER — Encounter: Payer: Self-pay | Admitting: Family Medicine

## 2014-04-19 VITALS — BP 152/96 | HR 63 | Temp 98.1°F | Wt 188.0 lb

## 2014-04-19 DIAGNOSIS — K219 Gastro-esophageal reflux disease without esophagitis: Secondary | ICD-10-CM

## 2014-04-19 DIAGNOSIS — K5901 Slow transit constipation: Secondary | ICD-10-CM

## 2014-04-19 DIAGNOSIS — I10 Essential (primary) hypertension: Secondary | ICD-10-CM

## 2014-04-19 MED ORDER — POLYETHYLENE GLYCOL 3350 17 GM/SCOOP PO POWD: 1.0000 | Freq: Once | ORAL | Status: DC

## 2014-04-19 MED ORDER — LISINOPRIL 5 MG PO TABS: 5.0000 mg | ORAL_TABLET | Freq: Every day | ORAL | Status: DC

## 2014-04-19 MED ORDER — OMEPRAZOLE 40 MG PO CPDR: 40.0000 mg | DELAYED_RELEASE_CAPSULE | Freq: Every day | ORAL | Status: DC

## 2014-04-19 NOTE — Patient Instructions (Signed)

## 2014-04-19 NOTE — Progress Notes (Signed)
Pre visit review using our clinic review tool, if applicable. No additional management support is needed unless otherwise documented below in the visit note. 

## 2014-04-19 NOTE — Progress Notes (Signed)
   Subjective:    Patient ID: William Williamson, male    DOB: 1932/11/14, 81 y.o.   MRN: [id number]  HPIpt here with his son con't blood per rectum on Saturday.  Pt has been constipated and straining a lot to go to bathroom He has been taking ducolax.      Review of Systems  Constitutional: Negative for fever, chills, diaphoresis, activity change, appetite change, fatigue and unexpected weight change.  Gastrointestinal: Positive for constipation, blood in stool and anal bleeding. Negative for nausea, vomiting, abdominal pain, diarrhea, abdominal distention and rectal pain.       + heartburn       Objective:   Physical Exam BP 152/96 mmHg  Pulse 63  Temp(Src) 98.1 F (36.7 C) (Oral)  Wt 188 lb (85.276 kg)  SpO2 99% General appearance: alert, cooperative, appears stated age and no distress Lungs: clear to auscultation bilaterally Heart: S1, S2 normal Abdomen: soft, non-tender; bowel sounds normal; no masses,  no organomegaly Extremities: extremities normal, atraumatic, no cyanosis or edema  Rectal-- heme positive black stool,  No ext hemorrhoids         Assessment & Plan:  1. Gastroesophageal reflux disease without esophagitis Ho on diet given to pt - omeprazole (PRILOSEC) 40 MG capsule; Take 1 capsule (40 mg total) by mouth daily.  Dispense: 30 capsule; Refill: 3 - Ambulatory referral to Gastroenterology - CBC with Differential - H. pylori antibody, IgG - Hepatic function panel - Basic metabolic panel  2. Slow transit constipation Miralax, inc water intake,  Inc fiber Refer to GI - polyethylene glycol powder (MIRALAX) powder; Take 255 g by mouth once.  Dispense: 255 g; Refill: 0 - Ambulatory referral to Gastroenterology  3. Essential hypertension Inc lisinopriil to 5 mg  - lisinopril (PRINIVIL,ZESTRIL) 5 MG tablet; Take 1 tablet (5 mg total) by mouth daily.  Dispense: 30 tablet; Refill: 2 Recheck 2-3 weeks

## 2014-04-20 ENCOUNTER — Encounter: Payer: Self-pay | Admitting: Physician Assistant

## 2014-04-20 LAB — CBC WITH DIFFERENTIAL/PLATELET
BASOS ABS: 0 10*3/uL (ref 0.0–0.1)
Basophils Relative: 0.3 % (ref 0.0–3.0)
Eosinophils Absolute: 0.1 10*3/uL (ref 0.0–0.7)
Eosinophils Relative: 1 % (ref 0.0–5.0)
HCT: 43.5 % (ref 39.0–52.0)
HEMOGLOBIN: 14.5 g/dL (ref 13.0–17.0)
Lymphocytes Relative: 22.3 % (ref 12.0–46.0)
Lymphs Abs: 2.1 10*3/uL (ref 0.7–4.0)
MCHC: 33.2 g/dL (ref 30.0–36.0)
MCV: 95.7 fl (ref 78.0–100.0)
MONOS PCT: 5.1 % (ref 3.0–12.0)
Monocytes Absolute: 0.5 10*3/uL (ref 0.1–1.0)
NEUTROS PCT: 71.3 % (ref 43.0–77.0)
Neutro Abs: 6.9 10*3/uL (ref 1.4–7.7)
PLATELETS: 229 10*3/uL (ref 150.0–400.0)
RBC: 4.55 Mil/uL (ref 4.22–5.81)
RDW: 13.4 % (ref 11.5–15.5)
WBC: 9.6 10*3/uL (ref 4.0–10.5)

## 2014-04-20 LAB — BASIC METABOLIC PANEL
BUN: 16 mg/dL (ref 6–23)
CHLORIDE: 101 meq/L (ref 96–112)
CO2: 28 mEq/L (ref 19–32)
Calcium: 9.4 mg/dL (ref 8.4–10.5)
Creatinine, Ser: 1.16 mg/dL (ref 0.40–1.50)
GFR: 64.54 mL/min (ref >60.00)
GLUCOSE: 90 mg/dL (ref 70–99)
Potassium: 4.5 mEq/L (ref 3.5–5.1)
SODIUM: 134 meq/L — AB (ref 135–145)

## 2014-04-20 LAB — HEPATIC FUNCTION PANEL
ALT: 15 U/L (ref 0–53)
AST: 22 U/L (ref 0–37)
Albumin: 4.6 g/dL (ref 3.5–5.2)
Alkaline Phosphatase: 39 U/L (ref 39–117)
BILIRUBIN TOTAL: 0.7 mg/dL (ref 0.2–1.2)
Bilirubin, Direct: 0.1 mg/dL (ref 0.0–0.3)
TOTAL PROTEIN: 7.3 g/dL (ref 6.0–8.3)

## 2014-04-20 LAB — H. PYLORI ANTIBODY, IGG: H PYLORI IGG: POSITIVE — AB

## 2014-04-21 ENCOUNTER — Telehealth: Payer: Self-pay

## 2014-04-21 ENCOUNTER — Other Ambulatory Visit: Payer: Self-pay | Admitting: Family Medicine

## 2014-04-21 ENCOUNTER — Other Ambulatory Visit (INDEPENDENT_AMBULATORY_CARE_PROVIDER_SITE_OTHER): Payer: PPO

## 2014-04-21 DIAGNOSIS — A048 Other specified bacterial intestinal infections: Secondary | ICD-10-CM

## 2014-04-21 DIAGNOSIS — E079 Disorder of thyroid, unspecified: Secondary | ICD-10-CM

## 2014-04-21 LAB — TSH: TSH: 10.55 u[IU]/mL — ABNORMAL HIGH (ref 0.35–4.50)

## 2014-04-21 MED ORDER — CLARITHROMYCIN 500 MG PO TABS: 500.0000 mg | ORAL_TABLET | Freq: Two times a day (BID) | ORAL | Status: DC

## 2014-04-21 MED ORDER — AMOXICILLIN 500 MG PO CAPS: 1000.0000 mg | ORAL_CAPSULE | Freq: Two times a day (BID) | ORAL | Status: DC

## 2014-04-21 MED ORDER — LEVOTHYROXINE SODIUM 88 MCG PO TABS: 88.0000 ug | ORAL_TABLET | Freq: Every day | ORAL | Status: DC

## 2014-04-21 NOTE — Telephone Encounter (Signed)
-----   Message from Rosalita Chessman, DO sent at 04/20/2014 12:40 PM EST ----- + h pylori ---- take omeprazole bid ,   Amoxicillin 500 mg 2 po bid Clarithromycin 500 mg  Bid  All for 14 days  And we will set up GI appointment

## 2014-04-21 NOTE — Telephone Encounter (Signed)
Discussed with patient and he voiced understanding, he has agreed to take the medications as directed and see GI, he wanted to get a 90 day supply on Synthroid and I advised his TSH meed to be done first due to his previous labs being abnormal. I made him aware I would send in an add on form to add the TSH and if we done have to make changes I would be more than happy to send a 90 day supply. He voiced understanding and agreed. Med's sent , add on sheet sent to [facility] and GI referral placed.      William Williamson

## 2014-04-22 ENCOUNTER — Other Ambulatory Visit: Payer: Self-pay

## 2014-04-22 MED ORDER — LEVOTHYROXINE SODIUM 100 MCG PO TABS: 100.0000 ug | ORAL_TABLET | Freq: Every day | ORAL | Status: DC

## 2014-04-27 ENCOUNTER — Ambulatory Visit: Payer: Commercial Managed Care - HMO | Admitting: Cardiology

## 2014-04-27 ENCOUNTER — Encounter: Payer: Self-pay | Admitting: Physician Assistant

## 2014-04-27 ENCOUNTER — Ambulatory Visit (INDEPENDENT_AMBULATORY_CARE_PROVIDER_SITE_OTHER): Payer: PPO | Admitting: Physician Assistant

## 2014-04-27 VITALS — BP 130/80 | Ht 73.0 in | Wt 187.0 lb

## 2014-04-27 DIAGNOSIS — K219 Gastro-esophageal reflux disease without esophagitis: Secondary | ICD-10-CM

## 2014-04-27 DIAGNOSIS — K5900 Constipation, unspecified: Secondary | ICD-10-CM

## 2014-04-27 DIAGNOSIS — K5909 Other constipation: Secondary | ICD-10-CM

## 2014-04-27 DIAGNOSIS — K648 Other hemorrhoids: Secondary | ICD-10-CM

## 2014-04-27 MED ORDER — HYDROCORTISONE ACETATE 25 MG RE SUPP: 25.0000 mg | Freq: Two times a day (BID) | RECTAL | Status: DC

## 2014-04-27 MED ORDER — MOVIPREP 100 G PO SOLR: ORAL | Status: DC

## 2014-04-27 NOTE — Progress Notes (Signed)
Patient ID: William Williamson, male   DOB: 1933-03-28, 81 y.o.   MRN: [id number]    HPI:   William Williamson is a 81 year old male referred for evacuation by Dr. Etter Sjogren due to rectal bleeding, and reflux.  William Williamson was previously followed by Dr. Verl Williamson but has not been seen here since 2008. He has a history of arthritis, hyperlipidemia, GERD, glaucoma, hyperthyroidism status post are a by, anemia post oromycin for tularemia, hearing loss, and Parkinson's disease. Over the past several years. The patient has experienced significant constipation which he attributed to his Parkinson's metastases. For the past year he has been using Dulcolax which she states made him feel nauseous and was not always effective in producing a bowel movement over the past several weeks he had to strain a lot and was having blood on the toilet tissue as well as blood dripping in the commode. He has no rectal pain. He does report a sensation of incomplete evacuation and some rectal itching. He also had been experiencing frequent heartburn with no dysphagia or nausea. He was seen by Dr. Dimas Millin and was sent for an H. pylori test which was positive. He was started on generic Prevpac and has been taking it for several days. He states he feels great on these medications he has no heartburn. He has no further bloating.   Past Medical History  Diagnosis Date  . Arthritis   . Hypercholesteremia   . GERD (gastroesophageal reflux disease)   . Glaucoma     Dr Bing Plume  . Hyperthyroidism     s/p RAI  . Anemia 1952    post Oromycin for [place]mia  . Hearing loss in right ear   . Anisocoria     post op  . Parkinson's disease     Past Surgical History  Procedure Laterality Date  . Septoplasty  1970  . Cardiac catheterization  07/2006    Dr.  Eustace Quail, negative  . Vasectomy    . Rai ablation  02/2008    hyperthyroidism  . Cataract extraction, bilateral      OS retinal surgery for post op floaters  . Colonoscopy     negative X 3   Family History  Problem Relation Age of Onset  . Osteoporosis Mother   . Hypertension Sister   . Hypertension Brother      X 3  . Diabetes Brother   . Prostate cancer Brother   . Stomach cancer Maternal Grandmother   . Pancreatic cancer Brother     ???  . Heart attack Maternal Grandfather 60  . Stroke Neg Hx    History  Substance Use Topics  . Smoking status: Former Smoker    Quit date: 04/02/1968  . Smokeless tobacco: Never Used  . Alcohol Use: 0.0 oz/week     Comment: 2 beers/day   Current Outpatient Prescriptions  Medication Sig Dispense Refill  . amoxicillin (AMOXIL) 500 MG capsule Take 2 capsules (1,000 mg total) by mouth 2 (two) times daily. 56 capsule 0  . aspirin 81 MG tablet Take 81 mg by mouth daily.      . bimatoprost (LUMIGAN) 0.03 % ophthalmic solution Place 1 drop into both eyes at bedtime.    . carbidopa-levodopa (SINEMET IR) 25-100 MG per tablet Take 1 tablet by mouth 3 (three) times daily. 270 tablet 3  . Cholecalciferol 2000 UNITS CAPS Take by mouth daily.      . clarithromycin (BIAXIN) 500 MG tablet Take 1 tablet (500 mg total) by  mouth 2 (two) times daily. 28 tablet 0  . dorzolamide-timolol (COSOPT) 22.3-6.8 MG/ML ophthalmic solution Place 1 drop into both eyes 2 (two) times daily.     . fish oil-omega-3 fatty acids 1000 MG capsule Take 2 g by mouth daily.      Marland Kitchen levothyroxine (SYNTHROID, LEVOTHROID) 100 MCG tablet Take 1 tablet (100 mcg total) by mouth daily. Repeat labs are due now 90 tablet 0  . lisinopril (PRINIVIL,ZESTRIL) 5 MG tablet Take 1 tablet (5 mg total) by mouth daily. 30 tablet 2  . omeprazole (PRILOSEC) 40 MG capsule Take 1 capsule (40 mg total) by mouth daily. 30 capsule 3  . polyethylene glycol powder (MIRALAX) powder Take 255 g by mouth once. 255 g 0  . simvastatin (ZOCOR) 20 MG tablet Take 1 tablet (20 mg total) by mouth every evening. 90 tablet 3  . MOVIPREP 100 G SOLR Use per prep instructions 1 kit 0   No current  facility-administered medications for this visit.   Allergies  Allergen Reactions  . Garlic     nausea  . Niacin     REACTION: flushing     Review of Systems: Gen: Denies any fever, chills, sweats, anorexia, fatigue, weakness, malaise, weight loss, and sleep disorder CV: Denies chest pain, angina, palpitations, syncope, orthopnea, PND, peripheral edema, and claudication. Resp: Denies dyspnea at rest, dyspnea with exercise, cough, sputum, wheezing, coughing up blood, and pleurisy. GI: Denies vomiting blood, jaundice, and fecal incontinence.   Denies dysphagia or odynophagia. GU : Denies urinary burning, blood in urine, urinary frequency, urinary hesitancy, nocturnal urination, and urinary incontinence. MS: Denies joint pain, limitation of movement, and swelling, stiffness, low back pain, extremity pain. Denies muscle weakness, cramps, atrophy.  Derm: Denies rash, itching, dry skin, hives, moles, warts, or unhealing ulcers.  Psych: Denies depression, anxiety, memory loss, suicidal ideation, hallucinations, paranoia, and confusion. Heme: Denies bruising, bleeding, and enlarged lymph nodes. Neuro:  Denies any headaches, dizziness, paresthesias. Endo:  Denies any problems with DM, thyroid, adrenal function   LAB RESULTS: TSH on 04/21/2014 was 10.55 CBC on 04/19/2014 had a white blood cell count 9.6, hemoglobin 14.5, hematocrit 43.5, platelets 229,000. H. pylori antibody IgG on 04/19/2014 was positive Hepatic function panel on 04/19/2014 had a total bili 0.7, direct bili 0.1, alkaline phosphatase 39, AST 22, ALT 15, total protein 7.3, albumin 4.6.  Prior Endoscopies:   Colonoscopy on 06/17/2002 was a normal exam from the cecum to rectum. He was advised to have surveillance in 10 years. Upper endoscopy on 12/11/2006 showed gastritis and H. pylori was negative.   Physical Exam: BP 130/80 mmHg  Ht _0  (1.854 m)  Wt 187 lb (84.823 kg)  BMI 24.68 kg/m2  SpO2 98% Constitutional:  Pleasant,well-developed male in no acute distress. HEENT: Normocephalic and atraumatic. Conjunctivae are normal. No scleral icterus. Neck supple. No thyromegaly Cardiovascular: Normal rate, regular rhythm.  Pulmonary/chest: Effort normal and breath sounds normal. No wheezing, rales or rhonchi. Abdominal: Soft, nondistended, nontender. Bowel sounds active throughout. There are no masses palpable. No hepatomegaly. Rectal: brown stool, heme neg, internal hemorrhoids noted on anoscopy Extremities: no edema Lymphadenopathy: No cervical adenopathy noted. Neurological: Alert and oriented to person place and time. Skin: Skin is warm and dry. No rashes noted. Psychiatric: Normal mood and affect. Behavior is normal.  ASSESSMENT AND PLAN: #1. Heartburn. H. pylori positive. Patient reports that he feels significant improvement on his generic Prevpac he will continue his prescription to complete and then continue omeprazole daily.  #2.  Constipation. Patient has been doing much better on Mira lax one capful daily it was explained to him that he may need to titrate the dose up or down over time to achieve the desired response.  #3. Rectal bleeding. This was likely due to his internal hemorrhoids. He will be given a trial of Anusol HC suppositories 1 per rectum twice a day for 10 days. He will also be scheduled for repeat colonoscopy to evaluate for polyps or neoplasia.The risks, benefits, and alternatives to colonoscopy with possible biopsy and possible polypectomy were discussed with the patient and they consent to proceed. The procedure will be scheduled with Dr. Fuller Plan. Further recommendations will be made pending the findings of his colonoscopy.    Adaleena Mooers, Vita Barley PA-C 04/27/2014, 11:12 AM

## 2014-04-27 NOTE — Progress Notes (Signed)
Reviewed and agree with management plan.  Elina Streng T. Berel Najjar, MD FACG 

## 2014-04-27 NOTE — Patient Instructions (Signed)
You have been scheduled for a colonoscopy. Please follow written instructions given to you at your visit today.  Please pick up your prep kit at the pharmacy within the next 1-3 days. If you use inhalers (even only as needed), please bring them with you on the day of your procedure. Your physician has requested that you go to www.startemmi.com and enter the access code given to you at your visit today. This web site gives a general overview about your procedure. However, you should still follow specific instructions given to you by our office regarding your preparation for the procedure.  Continue your Miralax daily.  Today we are giving you a handout on hemorrhoids to read.  We have sent the following medications to your pharmacy for you to pick up at your convenience: Anusol [facility] suppositories  I appreciate the opportunity to care for you.

## 2014-05-05 ENCOUNTER — Other Ambulatory Visit (INDEPENDENT_AMBULATORY_CARE_PROVIDER_SITE_OTHER): Payer: PPO | Admitting: *Deleted

## 2014-05-05 DIAGNOSIS — E785 Hyperlipidemia, unspecified: Secondary | ICD-10-CM

## 2014-05-05 DIAGNOSIS — I10 Essential (primary) hypertension: Secondary | ICD-10-CM

## 2014-05-05 LAB — BASIC METABOLIC PANEL
BUN: 15 mg/dL (ref 6–23)
CO2: 31 mEq/L (ref 19–32)
Calcium: 9 mg/dL (ref 8.4–10.5)
Chloride: 103 mEq/L (ref 96–112)
Creatinine, Ser: 1.18 mg/dL (ref 0.40–1.50)
GFR: 63.27 mL/min (ref >60.00)
Glucose, Bld: 96 mg/dL (ref 70–99)
Potassium: 4.7 mEq/L (ref 3.5–5.1)
Sodium: 135 mEq/L (ref 135–145)

## 2014-05-07 ENCOUNTER — Telehealth: Payer: Self-pay | Admitting: Family Medicine

## 2014-05-07 MED ORDER — SIMVASTATIN 20 MG PO TABS: 20.0000 mg | ORAL_TABLET | Freq: Every evening | ORAL | Status: DC

## 2014-05-07 NOTE — Telephone Encounter (Signed)
Caller name: Aboubacar Relation to pt: self Call back number: [phone] Pharmacy: [facility] [address] Reason for call:   Requesting simvastatin refill

## 2014-05-07 NOTE — Telephone Encounter (Signed)
Rx faxed.    KP 

## 2014-05-10 ENCOUNTER — Ambulatory Visit (AMBULATORY_SURGERY_CENTER): Payer: PPO | Admitting: Gastroenterology

## 2014-05-10 ENCOUNTER — Encounter: Payer: Self-pay | Admitting: Gastroenterology

## 2014-05-10 VITALS — BP 172/92 | HR 65 | Temp 96.1°F | Resp 20 | Ht 73.0 in | Wt 187.0 lb

## 2014-05-10 DIAGNOSIS — K921 Melena: Secondary | ICD-10-CM

## 2014-05-10 DIAGNOSIS — D128 Benign neoplasm of rectum: Secondary | ICD-10-CM

## 2014-05-10 DIAGNOSIS — R194 Change in bowel habit: Secondary | ICD-10-CM

## 2014-05-10 DIAGNOSIS — K5900 Constipation, unspecified: Secondary | ICD-10-CM

## 2014-05-10 DIAGNOSIS — D129 Benign neoplasm of anus and anal canal: Secondary | ICD-10-CM

## 2014-05-10 MED ORDER — SODIUM CHLORIDE 0.9 % IV SOLN: 500.0000 mL | INTRAVENOUS | Status: DC

## 2014-05-10 NOTE — Progress Notes (Signed)
Report to PACU, RN, vss, BBS= Clear.  

## 2014-05-10 NOTE — Progress Notes (Signed)
Called to room to assist during endoscopic procedure.  Patient ID and intended procedure confirmed with present staff. Received instructions for my participation in the procedure from the performing physician.  

## 2014-05-10 NOTE — Patient Instructions (Signed)
Discharge instructions given. Handouts on polyps and hemorrhoids. Hold aspirin and other NSAIDS for 2 weeks. Resume previous medications. YOU HAD AN ENDOSCOPIC PROCEDURE TODAY AT [facility]: Refer to the procedure report that was given to you for any specific questions about what was found during the examination.  If the procedure report does not answer your questions, please call your gastroenterologist to clarify.  If you requested that your care partner not be given the details of your procedure findings, then the procedure report has been included in a sealed envelope for you to review at your convenience later.  YOU SHOULD EXPECT: Some feelings of bloating in the abdomen. Passage of more gas than usual.  Walking can help get rid of the air that was put into your GI tract during the procedure and reduce the bloating. If you had a lower endoscopy (such as a colonoscopy or flexible sigmoidoscopy) you may notice spotting of blood in your stool or on the toilet paper. If you underwent a bowel prep for your procedure, then you may not have a normal bowel movement for a few days.  DIET: Your first meal following the procedure should be a light meal and then it is ok to progress to your normal diet.  A half-sandwich or bowl of soup is an example of a good first meal.  Heavy or fried foods are harder to digest and may make you feel nauseous or bloated.  Likewise meals heavy in dairy and vegetables can cause extra gas to form and this can also increase the bloating.  Drink plenty of fluids but you should avoid alcoholic beverages for 24 hours.  ACTIVITY: Your care partner should take you home directly after the procedure.  You should plan to take it easy, moving slowly for the rest of the day.  You can resume normal activity the day after the procedure however you should NOT DRIVE or use heavy machinery for 24 hours (because of the sedation medicines used during the test).    SYMPTOMS TO  REPORT IMMEDIATELY: A gastroenterologist can be reached at any hour.  During normal business hours, 8:30 AM to 5:00 PM Monday through Friday, call [phone].  After hours and on weekends, please call the GI answering service at [phone] who will take a message and have the physician on call contact you.   Following lower endoscopy (colonoscopy or flexible sigmoidoscopy):  Excessive amounts of blood in the stool  Significant tenderness or worsening of abdominal pains  Swelling of the abdomen that is new, acute  Fever of 100F or higher  FOLLOW UP: If any biopsies were taken you will be contacted by phone or by letter within the next 1-3 weeks.  Call your gastroenterologist if you have not heard about the biopsies in 3 weeks.  Our staff will call the home number listed on your records the next business day following your procedure to check on you and address any questions or concerns that you may have at that time regarding the information given to you following your procedure. This is a courtesy call and so if there is no answer at the home number and we have not heard from you through the emergency physician on call, we will assume that you have returned to your regular daily activities without incident.  SIGNATURES/CONFIDENTIALITY: You and/or your care partner have signed paperwork which will be entered into your electronic medical record.  These signatures attest to the fact that that the information above  on your After Visit Summary has been reviewed and is understood.  Full responsibility of the confidentiality of this discharge information lies with you and/or your care-partner.

## 2014-05-10 NOTE — Op Note (Signed)
[place]  [address]. [place], [address]   COLONOSCOPY PROCEDURE REPORT  PATIENT: William Williamson, William Williamson  MR#: [id number] BIRTHDATE: Oct 25, 1932 , 71  yrs. old GENDER: male ENDOSCOPIST: Ladene Artist, MD, [facility] REFERRED OQ:HUTMLY Lowne, DO PROCEDURE DATE:  05/10/2014 PROCEDURE:   Colonoscopy with snare polypectomy First Screening Colonoscopy - Avg.  risk and is 50 yrs.  old or older - No.  Prior Negative Screening - Now for repeat screening. N/A  History of Adenoma - Now for follow-up colonoscopy & has been > or = to 3 yrs.  N/A  Polyps Removed Today? Yes. ASA CLASS:   Class III INDICATIONS:hematochezia, constipation, and change in bowel habits.  MEDICATIONS: Monitored anesthesia care and Propofol 200 mg IV DESCRIPTION OF PROCEDURE:   After the risks benefits and alternatives of the procedure were thoroughly explained, informed consent was obtained.  The digital rectal exam revealed no abnormalities of the rectum.   The LB [id number]  endoscope was introduced through the anus and advanced to the cecum, which was identified by both the appendix and ileocecal valve. No adverse events experienced.   The quality of the prep was good, using MoviPrep  The instrument was then slowly withdrawn as the colon was fully examined.    COLON FINDINGS: A semi-pedunculated polyp measuring 12 mm in size was found in the rectum.  A polypectomy was performed using snare cautery.  The resection was complete, the polyp tissue was completely retrieved and sent to histology.   The examination was otherwise normal.  Retroflexed views revealed internal Grade I hemorrhoids. The time to cecum=3 minutes 06 seconds.  Withdrawal time=10 minutes 27 seconds.  The scope was withdrawn and the procedure completed. COMPLICATIONS: There were no immediate complications.  ENDOSCOPIC IMPRESSION: 1.   Semi-pedunculated polyp in the rectum; polypectomy performed using snare cautery 2.    Grade l internal hemorrhoids  RECOMMENDATIONS: 1.  Hold Aspirin and all other NSAIDS for 2 weeks. 2.  Await pathology results 3.  Given your age, you will not need another colonoscopy for colon cancer screening or polyp surveillance.  These types of tests usually stop around the age 79.  eSigned:  Ladene Artist, MD, [facility] 05/10/2014 11:33 AM

## 2014-05-11 ENCOUNTER — Telehealth: Payer: Self-pay

## 2014-05-11 NOTE — Telephone Encounter (Signed)
Left message on answering machine. 

## 2014-05-17 ENCOUNTER — Encounter: Payer: Self-pay | Admitting: Gastroenterology

## 2014-06-21 ENCOUNTER — Encounter: Payer: Self-pay | Admitting: Family Medicine

## 2014-06-21 ENCOUNTER — Ambulatory Visit (INDEPENDENT_AMBULATORY_CARE_PROVIDER_SITE_OTHER): Payer: PPO | Admitting: Family Medicine

## 2014-06-21 VITALS — BP 106/64 | HR 54 | Temp 97.5°F | Wt 187.4 lb

## 2014-06-21 DIAGNOSIS — A048 Other specified bacterial intestinal infections: Secondary | ICD-10-CM

## 2014-06-21 DIAGNOSIS — E039 Hypothyroidism, unspecified: Secondary | ICD-10-CM

## 2014-06-21 DIAGNOSIS — I10 Essential (primary) hypertension: Secondary | ICD-10-CM

## 2014-06-21 DIAGNOSIS — E785 Hyperlipidemia, unspecified: Secondary | ICD-10-CM

## 2014-06-21 DIAGNOSIS — B9681 Helicobacter pylori [H. pylori] as the cause of diseases classified elsewhere: Secondary | ICD-10-CM

## 2014-06-21 LAB — LIPID PANEL
Cholesterol: 130 mg/dL (ref 0–200)
HDL: 41.1 mg/dL (ref >39.00)
LDL Cholesterol: 73 mg/dL (ref 0–99)
NonHDL: 88.9
Total CHOL/HDL Ratio: 3
Triglycerides: 81 mg/dL (ref 0.0–149.0)
VLDL: 16.2 mg/dL (ref 0.0–40.0)

## 2014-06-21 LAB — BASIC METABOLIC PANEL
BUN: 15 mg/dL (ref 6–23)
CO2: 25 mEq/L (ref 19–32)
Calcium: 9.5 mg/dL (ref 8.4–10.5)
Chloride: 103 mEq/L (ref 96–112)
Creatinine, Ser: 1.18 mg/dL (ref 0.40–1.50)
GFR: 63.25 mL/min (ref >60.00)
Glucose, Bld: 96 mg/dL (ref 70–99)
Potassium: 4.8 mEq/L (ref 3.5–5.1)
SODIUM: 137 meq/L (ref 135–145)

## 2014-06-21 LAB — HEPATIC FUNCTION PANEL
ALBUMIN: 4.3 g/dL (ref 3.5–5.2)
ALT: 5 U/L (ref 0–53)
AST: 22 U/L (ref 0–37)
Alkaline Phosphatase: 50 U/L (ref 39–117)
Bilirubin, Direct: 0.2 mg/dL (ref 0.0–0.3)
TOTAL PROTEIN: 7 g/dL (ref 6.0–8.3)
Total Bilirubin: 0.9 mg/dL (ref 0.2–1.2)

## 2014-06-21 LAB — TSH: TSH: 2.26 u[IU]/mL (ref 0.35–4.50)

## 2014-06-21 MED ORDER — LISINOPRIL 5 MG PO TABS: 5.0000 mg | ORAL_TABLET | Freq: Every day | ORAL | Status: DC

## 2014-06-21 MED ORDER — PNEUMOCOCCAL 13-VAL CONJ VACC IM SUSP: 0.5000 mL | INTRAMUSCULAR | Status: DC

## 2014-06-21 MED ORDER — ZOSTER VACCINE LIVE 19400 UNT/0.65ML ~~LOC~~ SOLR: 0.6500 mL | Freq: Once | SUBCUTANEOUS | Status: DC

## 2014-06-21 NOTE — Assessment & Plan Note (Signed)
Pt finished 2 week treatment Omeprazole prn GI if symptoms return

## 2014-06-21 NOTE — Progress Notes (Signed)
Pre visit review using our clinic review tool, if applicable. No additional management support is needed unless otherwise documented below in the visit note. 

## 2014-06-21 NOTE — Assessment & Plan Note (Signed)
Stable --reviewed readings from home con't lisinopril rto 6 months

## 2014-06-21 NOTE — Progress Notes (Signed)
Patient ID: William Williamson, male    DOB: [birth year]  Age: 81 y.o. MRN: [id number]    Subjective:  Subjective HPI William Williamson presents for f/u bp , cholesterol and h pylori.    Review of Systems  Constitutional: Negative for fatigue and unexpected weight change.  Respiratory: Negative for cough and shortness of breath.   Cardiovascular: Negative for chest pain and palpitations.  Gastrointestinal: Negative for nausea, vomiting, abdominal pain, diarrhea, constipation, blood in stool and abdominal distention.  Psychiatric/Behavioral: Negative for decreased concentration. The patient is not nervous/anxious.     History Past Medical History  Diagnosis Date  . Arthritis   . Hypercholesteremia   . GERD (gastroesophageal reflux disease)   . Glaucoma     Dr Bing Plume  . Hyperthyroidism     s/p RAI  . Anemia 1952    post Oromycin for Tularemia  . Hearing loss in right ear   . Anisocoria     post op  . Parkinson's disease     He has past surgical history that includes Septoplasty (1970); Cardiac catheterization (07/2006); Vasectomy; RAI ablation (02/2008); Cataract extraction, bilateral; and Colonoscopy.   His family history includes Diabetes in his brother; Heart attack (age of onset: 27) in his maternal grandfather; Hypertension in his brother and sister; Osteoporosis in his mother; Pancreatic cancer in his brother; Prostate cancer in his brother; Stomach cancer in his maternal grandmother. There is no history of Stroke.He reports that he quit smoking about 46 years ago. He has never used smokeless tobacco. He reports that he does not drink alcohol or use illicit drugs.  Current Outpatient Prescriptions on File Prior to Visit  Medication Sig Dispense Refill  . aspirin 81 MG tablet Take 81 mg by mouth daily.      . bimatoprost (LUMIGAN) 0.03 % ophthalmic solution Place 1 drop into both eyes at bedtime.    . carbidopa-levodopa (SINEMET IR) 25-100 MG per tablet Take 1 tablet by  mouth 3 (three) times daily. 270 tablet 3  . Cholecalciferol 2000 UNITS CAPS Take by mouth daily.      . dorzolamide-timolol (COSOPT) 22.3-6.8 MG/ML ophthalmic solution Place 1 drop into both eyes 2 (two) times daily.     . fish oil-omega-3 fatty acids 1000 MG capsule Take 2 g by mouth daily.      Marland Kitchen levothyroxine (SYNTHROID, LEVOTHROID) 100 MCG tablet Take 1 tablet (100 mcg total) by mouth daily. Repeat labs are due now 90 tablet 0  . polyethylene glycol powder (MIRALAX) powder Take 255 g by mouth once. 255 g 0  . simvastatin (ZOCOR) 20 MG tablet Take 1 tablet (20 mg total) by mouth every evening. 90 tablet 1   No current facility-administered medications on file prior to visit.     Objective:  Objective Physical Exam  Constitutional: He appears well-developed and well-nourished. No distress.  Cardiovascular: Normal rate, regular rhythm and normal heart sounds.   Pulmonary/Chest: Effort normal and breath sounds normal. No respiratory distress.  Abdominal: Soft. Bowel sounds are normal. He exhibits no distension and no mass. There is no tenderness. There is no rebound and no guarding.  Psychiatric: He has a normal mood and affect. His behavior is normal. Judgment and thought content normal.   BP 106/64 mmHg  Pulse 54  Temp(Src) 97.5 F (36.4 C) (Oral)  Wt 187 lb 6.4 oz (85.004 kg)  SpO2 96% Wt Readings from Last 3 Encounters:  06/21/14 187 lb 6.4 oz (85.004 kg)  05/10/14 187 lb (84.823  kg)  04/27/14 187 lb (84.823 kg)     Lab Results  Component Value Date   WBC 9.6 04/19/2014   HGB 14.5 04/19/2014   HCT 43.5 04/19/2014   PLT 229.0 04/19/2014   GLUCOSE 96 05/05/2014   CHOL 142 12/21/2013   TRIG 106.0 12/21/2013   HDL 40.80 12/21/2013   LDLCALC 80 12/21/2013   ALT 15 04/19/2014   AST 22 04/19/2014   NA 135 05/05/2014   K 4.7 05/05/2014   CL 103 05/05/2014   CREATININE 1.18 05/05/2014   BUN 15 05/05/2014   CO2 31 05/05/2014   TSH 10.55* 04/21/2014   PSA 5.11*  06/19/2013   INR 0.9 RATIO 07/26/2006   HGBA1C 5.9 10/09/2006    Dg Eye Foreign Body  02/13/2013   CLINICAL DATA:  Metal working/exposure; clearance prior to MRI  EXAM: ORBITS FOR FOREIGN BODY - 2 VIEW  COMPARISON:  None.  FINDINGS: There is no evidence of metallic foreign body within the orbits. No significant bone abnormality identified.  IMPRESSION: No evidence of metallic foreign body within the orbits.   Electronically Signed   By: Lahoma Crocker M.D.   On: 02/13/2013 12:16   Mr Jeri Cos AO Contrast  02/13/2013   CLINICAL DATA:  Parkinson like symptoms. Trembling of the right arm. Headache.  BUN and creatinine were obtained on site at [facility] at  [address]  Results:  BUN 12 mg/dL,  Creatinine 1.0 mg/dL.  EXAM: MRI HEAD WITHOUT AND WITH CONTRAST  TECHNIQUE: Multiplanar, multiecho pulse sequences of the brain and surrounding structures were obtained without and with intravenous contrast.  CONTRAST:  17 cc MultiHance  COMPARISON:  None.  FINDINGS: Diffusion imaging does not show any acute or subacute infarction. The cerebellum is normal. In the substantia nigra region, I believe there is an abnormal appearance with absence of the normal swallow tail appearance. This goes along with the clinical diagnosis.  The cerebral hemispheres show mild age related atrophy with mild small vessel change in the deep white matter. No cortical or large vessel territory infarction. No mass lesion, hemorrhage, hydrocephalus or extra-axial collection.  IMPRESSION: No reversible intracranial finding. No acute infarction. Minimal chronic small vessel change of the deep white matter.  Absent swallow tail sign goes along with the clinical diagnosis of Parkinson's disease.   Electronically Signed   By: Nelson Chimes M.D.   On: 02/13/2013 15:35     Assessment & Plan:  Plan I have discontinued William Williamson's omeprazole, amoxicillin, clarithromycin, MOVIPREP, and hydrocortisone. I am also having him start on  zoster vaccine live (PF) and pneumococcal 13-valent conjugate vaccine. Additionally, I am having him maintain his aspirin, fish oil-omega-3 fatty acids, Cholecalciferol, bimatoprost, dorzolamide-timolol, carbidopa-levodopa, polyethylene glycol powder, levothyroxine, simvastatin, and lisinopril.  Meds ordered this encounter  Medications  . zoster vaccine live, PF, (ZOSTAVAX) 13086 UNT/0.65ML injection    Sig: Inject 19,400 Units into the skin once.    Dispense:  1 each    Refill:  0  . pneumococcal 13-valent conjugate vaccine (PREVNAR 13) SUSP injection    Sig: Inject 0.5 mLs into the muscle tomorrow at 10 am.    Dispense:  0.5 mL    Refill:  0  . lisinopril (PRINIVIL,ZESTRIL) 5 MG tablet    Sig: Take 1 tablet (5 mg total) by mouth daily.    Dispense:  90 tablet    Refill:  3    Problem List Items Addressed This Visit    Hyperlipidemia -  Primary   Relevant Medications   lisinopril (PRINIVIL,ZESTRIL) tablet   Other Relevant Orders   Lipid panel   Hepatic function panel   Basic Metabolic Panel (BMET)   Hepatic function panel   Lipid panel   H. pylori infection    Pt finished 2 week treatment Omeprazole prn GI if symptoms return      Relevant Medications   ZOSTAVAX 36468 UNT/0.65ML [place] SOLR   pneumococcal 13-valent conjugate vaccine (PREVNAR 13) SUSP injection   Essential hypertension    Stable --reviewed readings from home con't lisinopril rto 6 months       Relevant Medications   lisinopril (PRINIVIL,ZESTRIL) tablet   Other Relevant Orders   Basic metabolic panel    Other Visit Diagnoses    Hypothyroidism, unspecified hypothyroidism type        Relevant Orders    TSH    TSH       Follow-up: Return in about 6 months (around 12/22/2014), or if symptoms worsen or fail to improve, for f/u and labs.  Garnet Koyanagi, DO

## 2014-06-21 NOTE — Patient Instructions (Signed)

## 2014-07-19 ENCOUNTER — Telehealth: Payer: Self-pay | Admitting: Neurology

## 2014-07-19 MED ORDER — CARBIDOPA-LEVODOPA 25-100 MG PO TABS: 1.0000 | ORAL_TABLET | Freq: Three times a day (TID) | ORAL | Status: DC

## 2014-07-19 NOTE — Telephone Encounter (Signed)
Pt states that he needs a refill on the carbidopa levedopa 25/100 called into the [facility] on [address] pt phone number [phone]

## 2014-07-19 NOTE — Telephone Encounter (Signed)
Prescription sent to pharmacy.

## 2014-08-05 ENCOUNTER — Ambulatory Visit (INDEPENDENT_AMBULATORY_CARE_PROVIDER_SITE_OTHER): Payer: PPO | Admitting: Cardiology

## 2014-08-05 ENCOUNTER — Encounter: Payer: Self-pay | Admitting: Cardiology

## 2014-08-05 VITALS — BP 126/80 | HR 57 | Ht 73.0 in | Wt 188.4 lb

## 2014-08-05 DIAGNOSIS — I952 Hypotension due to drugs: Secondary | ICD-10-CM | POA: Diagnosis not present

## 2014-08-05 DIAGNOSIS — I10 Essential (primary) hypertension: Secondary | ICD-10-CM | POA: Diagnosis not present

## 2014-08-05 DIAGNOSIS — E7800 Pure hypercholesterolemia, unspecified: Secondary | ICD-10-CM

## 2014-08-05 NOTE — Patient Instructions (Signed)
Medication Instructions:  Your physician recommends that you continue on your current medications as directed. Please refer to the Current Medication list given to you today.   Labwork: Your physician recommends that you return for a FASTING lipid profile, cmet, cbc in 1 year  Testing/Procedures: None   Follow-Up: Your physician wants you to follow-up in: 1 year with Dr.Nelson You will receive a reminder letter in the mail two months in advance. If you don't receive a letter, please call our office to schedule the follow-up appointment.   Any Other Special Instructions Will Be Listed Below (If Applicable).

## 2014-08-05 NOTE — Progress Notes (Signed)
Patient ID: William Williamson, male   DOB: 08/03/1932, 81 y.o.   MRN: [id number] Patient ID: William Williamson, male   DOB: 1932-10-04, 81 y.o.   MRN: [id number]    Patient Name: William Williamson Date of Encounter: 08/05/2014  Primary Care Provider:  Garnet Koyanagi, DO Primary Cardiologist:  Dorothy Spark  Problem List   Past Medical History  Diagnosis Date  . Arthritis   . Hypercholesteremia   . GERD (gastroesophageal reflux disease)   . Glaucoma     Dr Bing Plume  . Hyperthyroidism     s/p RAI  . Anemia 1952    post Oromycin for [place]mia  . Hearing loss in right ear   . Anisocoria     post op  . Parkinson's disease    Past Surgical History  Procedure Laterality Date  . Septoplasty  1970  . Cardiac catheterization  07/2006    Dr.  Eustace Quail, negative  . Vasectomy    . Rai ablation  02/2008    hyperthyroidism  . Cataract extraction, bilateral      OS retinal surgery for post op floaters  . Colonoscopy      negative X 3    Allergies  Allergies  Allergen Reactions  . Garlic     nausea  . Niacin     REACTION: flushing    HPI  A pleasant 81 year old with history of hypertension, hyperlipidemia, hearing loss and Parkinson's disease who is being referred by his primary care physician for a high blood pressure. Patient was diagnosed with high blood pressure and started on ramipril however his blood pressure was very low and he felt dizzy so he discontinued. He states that when he is active his blood pressure goes down and is normal and his heart times a day taking any medicines. He is able to work in his yard for about 4 hours a day without any shortness of breath or chest pain. He feels dizzy occasionally. No palpitations or syncope. No lower extremity edema. No claudications.  04/17/2014 - patient is coming after 2 months. He underwent echocardiogram that showed mild concentric LVH with moderate distal septal hypertrophy. He was started on amlodipine 2.5 mg  daily. The patient states that he was tolerating it well at first but afterwards we will all on on episodes of dizziness and eye fatigue and he stopped taking it. We prescribed lisinopril 2.5 instead he so far tolerating it very well. He denies any chest pain, lower extremity edema or shortness of breath.  08/05/14 - this is 2 months follow-up, at the last visit patient saw Richardson Dopp, the patient was started on amlodipine that he didn't tolerate as his blood pressure dropped too much, consequently his blood pressure was in 160s, so he was started on lisinopril 2.5 mg daily daily was later increased to 5 mg daily. He has been checking his blood pressure at home and it's almost always controlled. The patient denies chest pain or shortness of breath. He has been compliant to his medicines and denies any side effects. He denies any palpitations or syncope no dizziness. No lower extremity edema.  Home Medications  Prior to Admission medications   Medication Sig Start Date End Date Taking? Authorizing Provider  aspirin 81 MG tablet Take 81 mg by mouth daily.     Yes Historical Provider, MD  bimatoprost (LUMIGAN) 0.03 % ophthalmic solution Place 1 drop into both eyes at bedtime.   Yes Historical Provider, MD  carbidopa-levodopa (SINEMET IR)  25-100 MG per tablet Take 1 tablet by mouth 3 (three) times daily. 07/28/13  Yes Rebecca S Tat, DO  Cholecalciferol 2000 UNITS CAPS Take by mouth daily.     Yes Historical Provider, MD  Coenzyme Q10 300 MG CAPS Take by mouth daily.     Yes Historical Provider, MD  dorzolamide-timolol (COSOPT) 22.3-6.8 MG/ML ophthalmic solution Place 1 drop into both eyes 2 (two) times daily.  05/14/13  Yes Historical Provider, MD  fish oil-omega-3 fatty acids 1000 MG capsule Take 2 g by mouth daily.     Yes Historical Provider, MD  Flaxseed, Linseed, (FLAX SEED OIL) 1000 MG CAPS Take by mouth daily.     Yes Historical Provider, MD  levothyroxine (SYNTHROID, LEVOTHROID) 88 MCG tablet Take  1 tablet (88 mcg total) by mouth daily. 12/25/13  Yes Rosalita Chessman, DO  simvastatin (ZOCOR) 20 MG tablet Take 1 tablet (20 mg total) by mouth every evening. 06/19/13  Yes Rosalita Chessman, DO    Family History  Family History  Problem Relation Age of Onset  . Osteoporosis Mother   . Hypertension Sister   . Hypertension Brother      X 3  . Diabetes Brother   . Prostate cancer Brother   . Stomach cancer Maternal Grandmother   . Pancreatic cancer Brother     ???  . Heart attack Maternal Grandfather 74  . Stroke Neg Hx     Social History  History   Social History  . Marital Status: Widowed    Spouse Name: N/A  . Number of Children: N/A  . Years of Education: N/A   Occupational History  . Retired     Probation officer   Social History Main Topics  . Smoking status: Former Smoker    Quit date: 04/02/1968  . Smokeless tobacco: Never Used  . Alcohol Use: No     Comment: 2 beers/day  . Drug Use: No  . Sexual Activity: Not on file   Other Topics Concern  . Not on file   Social History Narrative     Review of Systems, as per HPI, otherwise negative General:  No chills, fever, night sweats or weight changes.  Cardiovascular:  No chest pain, dyspnea on exertion, edema, orthopnea, palpitations, paroxysmal nocturnal dyspnea. Dermatological: No rash, lesions/masses Respiratory: No cough, dyspnea Urologic: No hematuria, dysuria Abdominal:   No nausea, vomiting, diarrhea, bright red blood per rectum, melena, or hematemesis Neurologic:  No visual changes, wkns, changes in mental status. All other systems reviewed and are otherwise negative except as noted above.  Physical Exam  Blood pressure 126/80, pulse 57, height 6\' 1"  (1.854 m), weight 188 lb 6.4 oz (85.458 kg).  General: Pleasant, NAD Psych: Normal affect. Neuro: Alert and oriented X 3. Moves all extremities spontaneously. HEENT: Normal  Neck: Supple without bruits or JVD. Lungs:  Resp regular and unlabored, CTA. Heart:  RRR no s3, s4, or murmurs. Abdomen: Soft, non-tender, non-distended, BS + x 4.  Extremities: No clubbing, cyanosis or edema. DP/PT/Radials 2+ and equal bilaterally.  Labs:  No results for input(s): CKTOTAL, CKMB, TROPONINI in the last 72 hours. Lab Results  Component Value Date   WBC 9.6 04/19/2014   HGB 14.5 04/19/2014   HCT 43.5 04/19/2014   MCV 95.7 04/19/2014   PLT 229.0 04/19/2014    No results found for: DDIMER Invalid input(s): POCBNP    Component Value Date/Time   NA 137 06/21/2014 1045   K 4.8 06/21/2014 1045   CL  103 06/21/2014 1045   CO2 25 06/21/2014 1045   GLUCOSE 96 06/21/2014 1045   BUN 15 06/21/2014 1045   CREATININE 1.18 06/21/2014 1045   CALCIUM 9.5 06/21/2014 1045   PROT 7.0 06/21/2014 1045   ALBUMIN 4.3 06/21/2014 1045   AST 22 06/21/2014 1045   ALT 5 06/21/2014 1045   ALKPHOS 50 06/21/2014 1045   BILITOT 0.9 06/21/2014 1045   GFRNONAA 62.87 06/02/2009 1244   GFRAA 94 01/09/2008 0000   Lab Results  Component Value Date   CHOL 130 06/21/2014   HDL 41.10 06/21/2014   LDLCALC 73 06/21/2014   TRIG 81.0 06/21/2014    Accessory Clinical Findings  Echocardiogram - 02/10/2014  - Left ventricle: The cavity size was normal. Wall thickness was increased in a pattern of mild LVH. There was moderate focal basal hypertrophy of the septum. Systolic function was normal. The estimated ejection fraction was in the range of 60% to 65%. Wall motion was normal; there were no regional wall motion abnormalities. Doppler parameters are consistent with abnormal left ventricular relaxation (grade 1 diastolic dysfunction). The E/e &' ratio is between 8-15, suggesting indeterminate LV filling pressure. - Aortic valve: Poorly visualized. Sclerosis without stenosis. There was mild regurgitation. - Aorta: Aortic root dimension: 42 mm (ED). - Aortic root: The aortic root is dilated. - Mitral valve: Posterior calcified annulus. There was  mild regurgitation. - Atrial septum: Possibly aneurysmal, not well visualzied. No indication of PFO by color doppler. - Tricuspid valve: There was mild regurgitation. - Pulmonary arteries: PA peak pressure: 23 mm Hg (S).  Impressions:  - LVEF 60-65%, mild LVH with moderate focal basal septal hypertrophy, aortic valve sclerosis, dilated sinotubular junction measuring 4.2 cm, mild central AI, posterior MAC, mild MR.  ECG - sinus rhythm, normal EKG.    Assessment & Plan  81 year old male  1. Hypertension - mild LVH with moderate focal basal septal hypertrophy on echocardiogram. Patient didn't tolerate amlodipine 2.5 mg daily as these lower his blood pressure significantly.  Blood pressures now controlled 5 mg of lisinopril daily. His follow-up creatinine and potassium was normal. We will continue the same regimen.  2. Hyperlipidemia - on simvastatin 20, all lipids are at goal. He is compliant with his medicines and has no side effects liver function tests were normal.  Follow-up in one year with CMP, CBC, TSH, lipids prior to the visit.Dorothy Spark, MD, [facility] 08/05/2014, 11:27 AM

## 2014-08-29 ENCOUNTER — Other Ambulatory Visit: Payer: Self-pay | Admitting: Family Medicine

## 2014-10-12 ENCOUNTER — Encounter: Payer: Self-pay | Admitting: Neurology

## 2014-10-12 ENCOUNTER — Ambulatory Visit (INDEPENDENT_AMBULATORY_CARE_PROVIDER_SITE_OTHER): Payer: PPO | Admitting: Neurology

## 2014-10-12 VITALS — BP 124/90 | HR 60 | Ht 73.0 in | Wt 185.0 lb

## 2014-10-12 DIAGNOSIS — G20 Parkinson's disease: Secondary | ICD-10-CM

## 2014-10-12 DIAGNOSIS — K117 Disturbances of salivary secretion: Secondary | ICD-10-CM

## 2014-10-12 NOTE — Patient Instructions (Addendum)
1.  Increase your carbidopa/levodopa 25/100 to 2 in the AM, 1 in the afternoon and 2 before dinner. 2.  Let me know if you want to do the botox for the drooling 3.  Call me if you have any problems with the above changes
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
## 2014-10-26 ENCOUNTER — Other Ambulatory Visit: Payer: Self-pay | Admitting: Family Medicine

## 2014-11-08 ENCOUNTER — Telehealth: Payer: Self-pay | Admitting: Neurology

## 2014-11-08 MED ORDER — CARBIDOPA-LEVODOPA 25-100 MG PO TABS: ORAL_TABLET | ORAL | Status: DC

## 2014-11-08 NOTE — Telephone Encounter (Signed)
Carbidopa Levodopa 25/100 refill requested. Per last office note- patient to remain on medication. Refill approved and sent to patient's pharmacy.   

## 2014-11-08 NOTE — Telephone Encounter (Signed)
Pt state that he needs a 90 day supply of carbidopa levodpa  He take 5 pills a day he would like it called into the [facility] on [address]

## 2014-12-23 ENCOUNTER — Encounter: Payer: Self-pay | Admitting: Family Medicine

## 2014-12-23 ENCOUNTER — Ambulatory Visit (INDEPENDENT_AMBULATORY_CARE_PROVIDER_SITE_OTHER): Payer: PPO | Admitting: Family Medicine

## 2014-12-23 VITALS — BP 118/74 | HR 59 | Temp 97.6°F | Wt 179.8 lb

## 2014-12-23 DIAGNOSIS — Z23 Encounter for immunization: Secondary | ICD-10-CM

## 2014-12-23 DIAGNOSIS — E785 Hyperlipidemia, unspecified: Secondary | ICD-10-CM

## 2014-12-23 DIAGNOSIS — M79651 Pain in right thigh: Secondary | ICD-10-CM | POA: Diagnosis not present

## 2014-12-23 LAB — COMPREHENSIVE METABOLIC PANEL
ALT: 2 U/L (ref 0–53)
AST: 17 U/L (ref 0–37)
Albumin: 4.3 g/dL (ref 3.5–5.2)
Alkaline Phosphatase: 46 U/L (ref 39–117)
BILIRUBIN TOTAL: 0.7 mg/dL (ref 0.2–1.2)
BUN: 15 mg/dL (ref 6–23)
CALCIUM: 9.4 mg/dL (ref 8.4–10.5)
CHLORIDE: 100 meq/L (ref 96–112)
CO2: 30 mEq/L (ref 19–32)
Creatinine, Ser: 1.07 mg/dL (ref 0.40–1.50)
GFR: 70.72 mL/min (ref >60.00)
Glucose, Bld: 91 mg/dL (ref 70–99)
Potassium: 5.5 mEq/L — ABNORMAL HIGH (ref 3.5–5.1)
Sodium: 135 mEq/L (ref 135–145)
Total Protein: 7.1 g/dL (ref 6.0–8.3)

## 2014-12-23 LAB — LIPID PANEL
CHOLESTEROL: 132 mg/dL (ref 0–200)
HDL: 38.4 mg/dL — ABNORMAL LOW (ref >39.00)
LDL CALC: 79 mg/dL (ref 0–99)
NonHDL: 94.06
TRIGLYCERIDES: 74 mg/dL (ref 0.0–149.0)
Total CHOL/HDL Ratio: 3
VLDL: 14.8 mg/dL (ref 0.0–40.0)

## 2014-12-23 NOTE — Patient Instructions (Signed)

## 2014-12-23 NOTE — Progress Notes (Signed)
Patient ID: William Williamson, male    DOB: [birth year]  Age: 81 y.o. MRN: [id number]    Subjective:  Subjective HPI William Williamson presents for f/u bp-- pt complains of   Review of Systems  Constitutional: Negative for diaphoresis, appetite change, fatigue and unexpected weight change.  Eyes: Negative for pain, redness and visual disturbance.  Respiratory: Negative for cough, chest tightness, shortness of breath and wheezing.   Cardiovascular: Negative for chest pain, palpitations and leg swelling.  Endocrine: Negative for cold intolerance, heat intolerance, polydipsia, polyphagia and polyuria.  Genitourinary: Negative for dysuria, frequency and difficulty urinating.  Musculoskeletal: Positive for myalgias.       Pain back R thigh-- occasionally Not hurting today  Neurological: Negative for dizziness, light-headedness, numbness and headaches.  All other systems reviewed and are negative.   History Past Medical History  Diagnosis Date  . Arthritis   . Hypercholesteremia   . GERD (gastroesophageal reflux disease)   . Glaucoma     Dr William Williamson  . Hyperthyroidism     s/p RAI  . Anemia 1952    post Oromycin for [place]  . Hearing loss in right ear   . Anisocoria     post op  . Parkinson's disease     He has past surgical history that includes Septoplasty (1970); Cardiac catheterization (07/2006); Vasectomy; RAI ablation (02/2008); Cataract extraction, bilateral; and Colonoscopy.   His family history includes Diabetes in his brother; Heart attack (age of onset: 36) in his maternal grandfather; Hypertension in his brother and sister; Osteoporosis in his mother; Pancreatic cancer in his brother; Prostate cancer in his brother; Stomach cancer in his maternal grandmother. There is no history of Stroke.He reports that he quit smoking about 46 years ago. He has never used smokeless tobacco. He reports that he does not drink alcohol or use illicit drugs.  Current Outpatient  Prescriptions on File Prior to Visit  Medication Sig Dispense Refill  . aspirin 81 MG tablet Take 81 mg by mouth daily.      . bimatoprost (LUMIGAN) 0.03 % ophthalmic solution Place 1 drop into both eyes at bedtime. 0.01%    . carbidopa-levodopa (SINEMET IR) 25-100 MG per tablet Take 2 tablets in the morning, 1 in the afternoon, 2 in the evening 450 tablet 3  . Cholecalciferol 2000 UNITS CAPS Take 1 capsule by mouth daily.     . dorzolamide-timolol (COSOPT) 22.3-6.8 MG/ML ophthalmic solution Place 1 drop into both eyes 2 (two) times daily.     . fish oil-omega-3 fatty acids 1000 MG capsule Take 2 g by mouth daily.      Marland Kitchen levothyroxine (SYNTHROID, LEVOTHROID) 100 MCG tablet TAKE ONE TABLET BY MOUTH ONCE DAILY 90 tablet 1  . polyethylene glycol (MIRALAX / GLYCOLAX) packet Take 17 g by mouth daily.    . simvastatin (ZOCOR) 20 MG tablet TAKE ONE TABLET BY MOUTH IN THE EVENING 90 tablet 1   No current facility-administered medications on file prior to visit.     Objective:  Objective Physical Exam  Constitutional: He is oriented to person, place, and time. Vital signs are normal. He appears well-developed and well-nourished. He is sleeping.  HENT:  Head: Normocephalic and atraumatic.  Mouth/Throat: Oropharynx is clear and moist.  Eyes: EOM are normal. Pupils are equal, round, and reactive to light.  Neck: Normal range of motion. Neck supple. No thyromegaly present.  Cardiovascular: Normal rate and regular rhythm.   No murmur heard. Pulmonary/Chest: Effort normal and breath sounds normal.  No respiratory distress. He has no wheezes. He has no rales. He exhibits no tenderness.  Musculoskeletal: Normal range of motion. He exhibits no edema or tenderness.  Neurological: He is alert and oriented to person, place, and time.  Skin: Skin is warm and dry.  Psychiatric: He has a normal mood and affect. His behavior is normal. Judgment and thought content normal.   BP 118/74 mmHg  Pulse 59   Temp(Src) 97.6 F (36.4 C) (Oral)  Wt 179 lb 12.8 oz (81.557 kg)  SpO2 94% Wt Readings from Last 3 Encounters:  12/23/14 179 lb 12.8 oz (81.557 kg)  10/12/14 185 lb (83.915 kg)  08/05/14 188 lb 6.4 oz (85.458 kg)     Lab Results  Component Value Date   WBC 9.6 04/19/2014   HGB 14.5 04/19/2014   HCT 43.5 04/19/2014   PLT 229.0 04/19/2014   GLUCOSE 91 12/23/2014   CHOL 132 12/23/2014   TRIG 74.0 12/23/2014   HDL 38.40* 12/23/2014   LDLCALC 79 12/23/2014   ALT 2 12/23/2014   AST 17 12/23/2014   NA 135 12/23/2014   K 5.5* 12/23/2014   CL 100 12/23/2014   CREATININE 1.07 12/23/2014   BUN 15 12/23/2014   CO2 30 12/23/2014   TSH 2.26 06/21/2014   PSA 5.11* 06/19/2013   INR 0.9 RATIO 07/26/2006   HGBA1C 5.9 10/09/2006    Dg Eye Foreign Body  02/13/2013   CLINICAL DATA:  Metal working/exposure; clearance prior to MRI  EXAM: ORBITS FOR FOREIGN BODY - 2 VIEW  COMPARISON:  None.  FINDINGS: There is no evidence of metallic foreign body within the orbits. No significant bone abnormality identified.  IMPRESSION: No evidence of metallic foreign body within the orbits.   Electronically Signed   By: William Williamson M.D.   On: 02/13/2013 12:16   Mr William Williamson ZD Contrast  02/13/2013   CLINICAL DATA:  Parkinson like symptoms. Trembling of the right arm. Headache.  BUN and creatinine were obtained on site at [facility] at  [address]  Results:  BUN 12 mg/dL,  Creatinine 1.0 mg/dL.  EXAM: MRI HEAD WITHOUT AND WITH CONTRAST  TECHNIQUE: Multiplanar, multiecho pulse sequences of the brain and surrounding structures were obtained without and with intravenous contrast.  CONTRAST:  17 cc MultiHance  COMPARISON:  None.  FINDINGS: Diffusion imaging does not show any acute or subacute infarction. The cerebellum is normal. In the substantia nigra region, I believe there is an abnormal appearance with absence of the normal swallow tail appearance. This goes along with the clinical diagnosis.  The  cerebral hemispheres show mild age related atrophy with mild small vessel change in the deep white matter. No cortical or large vessel territory infarction. No mass lesion, hemorrhage, hydrocephalus or extra-axial collection.  IMPRESSION: No reversible intracranial finding. No acute infarction. Minimal chronic small vessel change of the deep white matter.  Absent swallow tail sign goes along with the clinical diagnosis of Parkinson's disease.   Electronically Signed   By: Nelson Chimes M.D.   On: 02/13/2013 15:35     Assessment & Plan:  Plan I have discontinued Mr. Gettinger's zoster vaccine live (PF), pneumococcal 13-valent conjugate vaccine, and lisinopril. I am also having him maintain his aspirin, fish oil-omega-3 fatty acids, Cholecalciferol, bimatoprost, dorzolamide-timolol, polyethylene glycol, levothyroxine, simvastatin, and carbidopa-levodopa.  No orders of the defined types were placed in this encounter.    Problem List Items Addressed This Visit    Right thigh pain  No pain today Pt wanted to hold off of doing anything If it gets worse he will call us      Hyperlipidemia - Primary   Relevant Orders   Comp Met (CMET) (Completed)   Lipid panel (Completed)    Other Visit Diagnoses    Need for immunization against influenza        Relevant Orders    Flu vaccine HIGH DOSE PF (Fluzone High dose) (Completed)       Follow-up: Return in about 6 months (around 06/22/2015), or if symptoms worsen or fail to improve, for annual exam, fasting.  Garnet Koyanagi, DO

## 2014-12-23 NOTE — Progress Notes (Signed)
Pre visit review using our clinic review tool, if applicable. No additional management support is needed unless otherwise documented below in the visit note. 

## 2014-12-24 DIAGNOSIS — M79651 Pain in right thigh: Secondary | ICD-10-CM | POA: Insufficient documentation

## 2014-12-24 NOTE — Assessment & Plan Note (Signed)
No pain today Pt wanted to hold off of doing anything If it gets worse he will call us

## 2015-01-12 ENCOUNTER — Encounter: Payer: Self-pay | Admitting: Neurology

## 2015-01-12 ENCOUNTER — Ambulatory Visit (INDEPENDENT_AMBULATORY_CARE_PROVIDER_SITE_OTHER): Payer: PPO | Admitting: Neurology

## 2015-01-12 VITALS — BP 126/80 | Ht 73.0 in | Wt 181.0 lb

## 2015-01-12 DIAGNOSIS — G20 Parkinson's disease: Secondary | ICD-10-CM

## 2015-01-12 DIAGNOSIS — I10 Essential (primary) hypertension: Secondary | ICD-10-CM | POA: Diagnosis not present

## 2015-01-12 DIAGNOSIS — K117 Disturbances of salivary secretion: Secondary | ICD-10-CM
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
## 2015-01-15 ENCOUNTER — Other Ambulatory Visit: Payer: Self-pay | Admitting: Family Medicine

## 2015-02-09 ENCOUNTER — Telehealth: Payer: Self-pay | Admitting: Neurology

## 2015-02-09 NOTE — Telephone Encounter (Signed)
Spoke with patient and he wants to wait to discuss until his appt at the end of January. If his symptoms get worse he will call and let us know.

## 2015-02-09 NOTE — Telephone Encounter (Signed)
Got notes from Dr. Percell Miller from [facility].  Pt saw him on 02/04/15 and he felt pt had R L5 radiculopathy.  He was referred back to me for eval and tx (dr. Greggory Keen recommended neurontin/lyrica to pt but told that he didn't want to start drug and to f/u with me).  I don't think that I knew about these sx's and just saw him few weeks ago.  Does pt need new appt to eval?  Not sure that insurance will pay for testing without documentation in my notes

## 2015-05-03 ENCOUNTER — Encounter: Payer: Self-pay | Admitting: Neurology

## 2015-05-03 ENCOUNTER — Ambulatory Visit (INDEPENDENT_AMBULATORY_CARE_PROVIDER_SITE_OTHER): Payer: PPO | Admitting: Neurology

## 2015-05-03 VITALS — BP 148/86 | HR 82 | Ht 73.0 in | Wt 185.0 lb

## 2015-05-03 DIAGNOSIS — K117 Disturbances of salivary secretion: Secondary | ICD-10-CM

## 2015-05-03 DIAGNOSIS — G20 Parkinson's disease: Secondary | ICD-10-CM

## 2015-05-03 NOTE — Progress Notes (Signed)
William Williamson was seen today in the movement disorders clinic for neurologic consultation at the request of William Koyanagi, DO.  The consultation is for the evaluation of left hand tremor and weakness.  The pt is 82 y.o. male R hand dominant.  He states that he got bit by many ticks in the summer and got headaches.  He then noted L hand tremor some time later (about 1 month ago).  The pt thought that it was lyme disease and he was told that he did not have lyme disease and did not have RMSF.  He notes L hand tremor most when sitting down.  He does not notice it when he is moving.  He has no tremor in the L leg or on the R side   03/31/13 update:  The patient presents today for followup.  He was diagnosed with mild Parkinson's disease on 02/03/2013.  He is currently on no medications.  He does think that tremor is somewhat more prominent but it really doesn't bother him.  When he shaves with the R hand, he may note some tremor on the L.  Otherwise, its more like he can feel it rather than see it.  He had an MRI of the brain since last visit.  There was an absent "swallow to tail sign" which can be seen with PD.  He is exercising.  No falls.  No balance problems.  No syncope.  No n/v, visual distortions or hallucinations.  07/28/13 update:  The patient is seen somewhat earlier than expected.  He does have history of Parkinson's disease.  He has not wanted to be on any medications.  He called recently and said that tremor, which was previously only on the left had spread to the right hand.  The right hand shakes more than the L now.  The L seems "docile" now.    One fall.  Pt was on the bicycle and the dog was on the leash and he fell off the bike.  He walks 3 miles every AM.  In addition, he was concerned because he felt like he had a "sunburn" on his scalp.  He did recently see his primary care physician.  I reviewed those notes.  He was started on Valtrex on April 21 for possible herpetic neuralgia.  He did  have shingles vaccine previously.  Is feeling better in that regard.  10/06/13 update:  Pt was started on carbidopa/levodopa 25/100 last visit. He takes it at 6:30/4-5pm/11pm.   He no longer has tremor with ambulation on the R but does still note the tremor with resting the hand in the lap while watching TV.  No falls.  No hallucinations.  No lightheadedness or near syncope.  He is walking 3 miles per day and biking a mile.  He is having some constipation.  04/08/14 update:  The patient is following up today regarding his Parkinson's disease.  He is supposed to be on carbidopa/levodopa 25/100, one tablet 3 times per day but the large majority of the time he only takes it bid and misses the middle of the day dose.  I did review records available to me since last visit.  He saw cardiology on 02/08/2014 for fluctuating blood pressure.  His blood pressure was somewhat elevated in the office and he subsequently had an echocardiogram that demonstrated normal LV ejection fraction with moderate septal hypertrophy.  He was started on amlodipine.  Unfortunatley it caused near syncope and he states that something else was  called in but he hasn't picked it up.  Per records, lisinopril was called in yesterday.  He is also awaiting a nephrology appt for hyperkalemia.  He is having some fatigue.  10/12/14 update:  The patient is seen today back in follow-up, accompanied by his son who supplements the history.  He is on carbidopa/levodopa 25/100, one tablet 3 times per day.  He has trouble remembering the middle of the day dosage.   He has been seeing Dr. Meda Coffee in regards to his high blood pressure.  He had near syncope with amlodipine and was changed to lisinopril.  He is now on 5 mg of lisinopril and doing well and he saw her in May and was doing so well that she recommended a follow-up in one year.  He was treated for an H. pylori infection since our last visit and did see gastroenterology.  Records that were available to me  were reviewed.  His biggest issue is drooling and he wakes up in pools of drool.    01/12/15 update:  The patient is f/u today.  Last visit, I increased his carbidopa/levodopa 25/100 to 2/1/2 but he thought that the medication potentially was making him feel bad so he decreased it back to 1 po tid, although he admits the middle of the day dose 1/2 the time.  He only was up on the increased dosage for a week and then he dropped down on it.  He had trouble describing how he felt "bad" but just stated that he had a "dull headache."  He doesn't describe nausea.  He does think that the higher dose definitely helped the tremor.  No falls since last visit.  He has been exercising daily.  He is still drooling.  He does not wish to pursue myobloc.  He went off lisinopril due to lightheadedness and he feels much better.  Asks me about melanoma risk with levodopa.  05/03/15 update:  The patient follows up today, accompanied by his wife who supplements the history.  He is on carbidopa/levodopa 25/100, one tablet 3 times per day (7am/12pm/5:30pm).  Last visit, I felt that he was under dosed, but he did not want to increase the medication because when I did that in the past he thought that that caused headache, although I was not convinced that it was from the medication.  He had one fall since last visit; he was taking a 3 mile walk and about a mile from his home he fell in the ditch and hurt his foot, which is better now.  He has not had hallucinations.  No lightheadedness or near syncope.  He worries about SE of levodopa and worries about it causing glaucoma and melanoma (he read about those things).  Not long after our last visit he saw his orthopedic surgeon at [facility] and he told the patient that he felt that he had a right L5 radiculopathy and he was told to come to me for treatment.  It was recommended to him that he try Neurontin or Lyrica, but he was also told that I prescribed the medication.  I called the  patient to discuss this, as I had no knowledge of his symptoms and he told me he wanted to wait until this visit discussing it, even though this visit was several months from his visit from his orthopedic surgeon.   Pt states that he is no longer having back or radicular sx's.  Does again c/o sialorrhea.  MRI brain 02/13/13:  CLINICAL  DATA: Parkinson like symptoms. Trembling of the right arm.  Headache.  BUN and creatinine were obtained on site at [facility] at  [address]  Results: BUN 12 mg/dL, Creatinine 1.0 mg/dL.  EXAM:  MRI HEAD WITHOUT AND WITH CONTRAST  TECHNIQUE:  Multiplanar, multiecho pulse sequences of the brain and surrounding  structures were obtained without and with intravenous contrast.  CONTRAST: 17 cc MultiHance  COMPARISON: None.  FINDINGS:  Diffusion imaging does not show any acute or subacute infarction.  The cerebellum is normal. In the substantia nigra region, I believe  there is an abnormal appearance with absence of the normal swallow  tail appearance. This goes along with the clinical diagnosis.  The cerebral hemispheres show mild age related atrophy with mild  small vessel change in the deep white matter. No cortical or large  vessel territory infarction. No mass lesion, hemorrhage,  hydrocephalus or extra-axial collection.  IMPRESSION:  No reversible intracranial finding. No acute infarction. Minimal  chronic small vessel change of the deep white matter.  Absent swallow tail sign goes along with the clinical diagnosis of  Parkinson's disease.    PREVIOUS MEDICATIONS: none to date  ALLERGIES:   Allergies  Allergen Reactions  . Garlic     nausea  . Niacin     REACTION: flushing    CURRENT MEDICATIONS:  Current Outpatient Prescriptions on File Prior to Visit  Medication Sig Dispense Refill  . aspirin 81 MG tablet Take 81 mg by mouth daily.      . carbidopa-levodopa (SINEMET IR) 25-100 MG per tablet Take 2 tablets in the  morning, 1 in the afternoon, 2 in the evening (Patient taking differently: Take 1 tablet by mouth 3 (three) times daily. ) 450 tablet 3  . Cholecalciferol 2000 UNITS CAPS Take 1 capsule by mouth daily.     . dorzolamide-timolol (COSOPT) 22.3-6.8 MG/ML ophthalmic solution Place 1 drop into both eyes 2 (two) times daily.     . fish oil-omega-3 fatty acids 1000 MG capsule Take 2 g by mouth daily.      Marland Kitchen levothyroxine (SYNTHROID, LEVOTHROID) 100 MCG tablet TAKE ONE TABLET BY MOUTH ONCE DAILY 90 tablet 1  . polyethylene glycol (MIRALAX / GLYCOLAX) packet Take 17 g by mouth daily.    . simvastatin (ZOCOR) 20 MG tablet TAKE ONE TABLET BY MOUTH IN THE EVENING 90 tablet 1   No current facility-administered medications on file prior to visit.    PAST MEDICAL HISTORY:   Past Medical History  Diagnosis Date  . Arthritis   . Hypercholesteremia   . GERD (gastroesophageal reflux disease)   . Glaucoma     Dr Bing Plume  . Hyperthyroidism     s/p RAI  . Anemia 1952    post Oromycin for [place]  . Hearing loss in right ear   . Anisocoria     post op  . Parkinson's disease ([place])     PAST SURGICAL HISTORY:   Past Surgical History  Procedure Laterality Date  . Septoplasty  1970  . Cardiac catheterization  07/2006    Dr.  Eustace Quail, negative  . Vasectomy    . Rai ablation  02/2008    hyperthyroidism  . Cataract extraction, bilateral      OS retinal surgery for post op floaters  . Colonoscopy      negative X 3    SOCIAL HISTORY:   Social History   Social History  . Marital Status: Widowed    Spouse  Name: N/A  . Number of Children: N/A  . Years of Education: N/A   Occupational History  . Retired     Probation officer   Social History Main Topics  . Smoking status: Former Smoker    Quit date: 04/02/1968  . Smokeless tobacco: Never Used  . Alcohol Use: No     Comment: 2 beers/day  . Drug Use: No  . Sexual Activity: Not on file   Other Topics Concern  . 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 Father Deceased 57    natural causes  . Mother Deceased     "old age"  . Brother Deceased     2, train accident; ? [place]  . Brother Alive   . Sister Alive     3, degen arthritis (bedridden at 80 y/o); dementia  . Child Alive     3, alive and well    ROS:  A complete 10 system review of systems was obtained and was unremarkable apart from what is mentioned above.  PHYSICAL EXAMINATION:    VITALS:   Filed Vitals:   05/03/15 1003  BP: 148/86  Pulse: 82  Height: 6\' 1"  (1.854 m)  Weight: 185 lb (83.915 kg)    GEN:  The patient appears stated age and is in NAD. HEENT:  Normocephalic, atraumatic.  The mucous membranes are moist. The superficial temporal arteries are without ropiness or tenderness. CV:  RRR Lungs:  CTAB Neck/HEME:  There are no carotid bruits bilaterally.  Neurological examination:  Orientation: The patient is alert and oriented x3. Fund of knowledge is appropriate.  Recent and remote memory are intact.  Attention and concentration are normal.    Able to name objects and repeat phrases. Cranial nerves: There is good facial symmetry.  There is facial hypomimia.   The speech is fluent and clear. Soft palate rises symmetrically and there is no tongue deviation. Hearing is decreased to conversational tone. Motor: Strength is 5/5 in the bilateral upper and lower extremities.   Shoulder shrug is equal and symmetric.  There is no pronator drift.  Movement examination: Tone: There is  mild-mod increased tone in the RUE Abnormal movements: There is independent RUE/LUE resting tremor, R more than L.   Coordination:  There is mild significant decremation with rapid alternating movements today bilaterally Gait and Station: The patient has no difficulty arising out of a deep-seated chair without the use of the hands. The patient's stride length is normal but there is  increase in bilateral UE resting tremor and decreased arm swing bilaterally.  ASSESSMENT/PLAN:  1.  Parkinsons disease, Hoehn and Yahr stage 2.5  - He and I discussed multiple options today.  I still think that he is underdosed but he doesn't want to change it right now.  When he went up on the medication for a week, he thought it caused headache but admits that it helped with tremor.  He is underdosed but he doesn't want to change the dosage.  Will stay on carbidopa/levodopa 25/100 tid for now.  Discussed again melanoma risk and told him more associated with PD than with the medication itself.  Should get skin checks.   Risks, benefits, side effects and alternative therapies were discussed.  The opportunity to ask questions was given and they were answered to the best of my ability.  The patient expressed understanding and  willingness to follow the outlined treatment protocols.  -discussed exercise in detail; he is walking but would like to see him add in stationary bike.  He has one at home but isn't using it. 2.  Tinnitus  -I told him that this is not associated with PD and is likely due to hearing loss.  Pt admits that this started when began to have problems with hearing 3.  constipation, associated with Parkinson's disease  -Using generic form of miralax now (clearlax now) 4.  Hypertension  -off lisinopril now and lightheadedness is much better.  5.  Sialorrhea  -Talked about myobloc but he doesn't wish to pursue myobloc. Discussed again today.  Talked to him about atropine 0.1% opthalmic but he wants to hold on that for now. 6.  Follow up is anticipated in the next few months, sooner should new neurologic issues arise.  Much greater than 50% of this visit was spent in counseling with the patient and the family.  Total face to face time:  25 min

## 2015-05-04 DIAGNOSIS — L570 Actinic keratosis: Secondary | ICD-10-CM | POA: Diagnosis not present

## 2015-05-04 DIAGNOSIS — X32XXXD Exposure to sunlight, subsequent encounter: Secondary | ICD-10-CM | POA: Diagnosis not present

## 2015-05-04 DIAGNOSIS — Z1283 Encounter for screening for malignant neoplasm of skin: Secondary | ICD-10-CM | POA: Diagnosis not present

## 2015-05-09 ENCOUNTER — Other Ambulatory Visit: Payer: Self-pay | Admitting: Family Medicine

## 2015-05-09 NOTE — Telephone Encounter (Signed)
Medication filled to pharmacy as requested.   

## 2015-05-10 ENCOUNTER — Ambulatory Visit (INDEPENDENT_AMBULATORY_CARE_PROVIDER_SITE_OTHER): Payer: PPO | Admitting: Podiatry

## 2015-05-10 ENCOUNTER — Encounter: Payer: Self-pay | Admitting: Podiatry

## 2015-05-10 DIAGNOSIS — L600 Ingrowing nail: Secondary | ICD-10-CM | POA: Diagnosis not present

## 2015-05-10 MED ORDER — CEPHALEXIN 500 MG PO CAPS: 500.0000 mg | ORAL_CAPSULE | Freq: Three times a day (TID) | ORAL | Status: DC

## 2015-05-10 NOTE — Patient Instructions (Signed)

## 2015-05-12 NOTE — Progress Notes (Signed)
Subjective:     Patient ID: William Williamson, male   DOB: 1932/08/10, 82 y.o.   MRN: [id number]  HPI 82 year old male presents the office they for concerns of reoccurring ingrown toenails of both of his big toes which are painful with pressure in shoe gear. He states that he gets swelling around the area and redness at times however he denies any recent surgery drainage. He does try trim the toenail himself any resolution of symptoms recur quickly. At this time he like to have the toenails on the corners removed to help prevent further problems. No other complaints at this time.  Review of Systems  All other systems reviewed and are negative.      Objective:   Physical Exam General: AAO x3, NAD  Dermatological:  There is incurvation of both hallux toenails with mild edema around the symptomatic nail borders. There is no erythema or increase in warmth. There is no drainage or pus. There is no pathology to the other toenails at this time.  Vascular: Dorsalis Pedis artery and Posterior Tibial artery pedal pulses are 2/4 bilateral with immedate capillary fill time. Pedal hair growth present. No varicosities and no lower extremity edema present bilateral. There is no pain with calf compression, swelling, warmth, erythema.   Neruologic: Grossly intact via light touch bilateral. Vibratory intact via tuning fork bilateral. Protective threshold with Semmes Wienstein monofilament intact to all pedal sites bilateral. Patellar and Achilles deep tendon reflexes 2+ bilateral. No Babinski or clonus noted bilateral.   Musculoskeletal: No gross boney pedal deformities bilateral. No pain, crepitus, or limitation noted with foot and ankle range of motion bilateral. Muscular strength 5/5 in all groups tested bilateral.  Gait: Unassisted, Nonantalgic.      Assessment:      82 year old male bilateral hallux symptomatic ingrown toenail     Plan:     -Treatment options discussed including all  alternatives, risks, and complications -Etiology of symptoms were discussed -At this time, the patient is requesting partial nail removal with chemical matricectomy to the symptomatic portion of the nail. Risks and complications were discussed with the patient for which they understand and  verbally consent to the procedure. Under sterile conditions a total of 3 mL of a mixture of 2% lidocaine plain and 0.5% Marcaine plain was infiltrated in a hallux block fashion. Once anesthetized, the skin was prepped in sterile fashion. A tourniquet was then applied. Next the medial aspect of hallux nail border was then sharply excised making sure to remove the entire offending nail border. Once the nails were ensured to be removed area was debrided and the underlying skin was intact. There is no purulence identified in the procedure. Next phenol was then applied under standard conditions and copiously irrigated. Silvadene was applied. A dry sterile dressing was applied. After application of the dressing the tourniquet was removed and there is found to be an immediate capillary refill time to the digit. The patient tolerated the procedure well any complications. Post procedure instructions were discussed the patient for which he verbally understood. Follow-up in one week for nail check or sooner if any problems are to arise. Discussed signs/symptoms of infection and directed to call the office immediately should any occur or go directly to the emergency room. In the meantime, encouraged to call the office with any questions, concerns, changes symptoms. -Rx Keflex  Celesta Gentile, DPM

## 2015-05-17 ENCOUNTER — Ambulatory Visit (INDEPENDENT_AMBULATORY_CARE_PROVIDER_SITE_OTHER): Payer: PPO | Admitting: Podiatry

## 2015-05-17 ENCOUNTER — Encounter: Payer: Self-pay | Admitting: Podiatry

## 2015-05-17 VITALS — BP 183/104 | HR 60 | Resp 16

## 2015-05-17 DIAGNOSIS — Z9889 Other specified postprocedural states: Secondary | ICD-10-CM

## 2015-05-17 DIAGNOSIS — L600 Ingrowing nail: Secondary | ICD-10-CM

## 2015-05-17 NOTE — Patient Instructions (Signed)

## 2015-05-17 NOTE — Progress Notes (Signed)
Patient ID: [name], male   DOB: [birth year], 82 y.o.   MRN: [id number]  Subjective: [name] is a 82 y.o.  male returns to office today for follow up evaluation after having permanent Hallux medial and lateral nail avulsion performed. Patient has been soaking using epsom and applying topical antibiotic covered with bandaid daily. Patient denies fevers, chills, nausea, vomiting. Denies any calf pain, chest pain, SOB.   Objective:  Vitals: Reviewed  General: Well developed, nourished, in no acute distress, alert and oriented x3   Dermatology: Skin is warm, dry and supple bilateral. Medial and lateral hallux nail border appears to be clean, dry, with mild granular tissue and surrounding scab. There is no surrounding erythema, edema, drainage/purulence. The remaining nails appear unremarkable at this time. There are no other lesions or other signs of infection present.  Neurovascular status: Intact. No lower extremity swelling; No pain with calf compression bilateral.  Musculoskeletal: Decreased tenderness to palpation of the  hallux nail folds. Muscular strength within normal limits bilateral.   Assesement and Plan: S/p partial nail avulsion, doing well.   -Continue soaking in epsom salts twice a day followed by antibiotic ointment and a band-aid. Can leave uncovered at night. Continue this until completely healed.  -If the area has not healed in 2 weeks, call the office for follow-up appointment, or sooner if any problems arise.  -Monitor for any signs/symptoms of infection. Call the office immediately if any occur or go directly to the emergency room. Call with any questions/concerns.  Celesta Gentile, DPM

## 2015-06-22 ENCOUNTER — Telehealth: Payer: Self-pay

## 2015-06-22 NOTE — Telephone Encounter (Signed)
Left message for patient to return call.

## 2015-06-22 NOTE — Telephone Encounter (Signed)
Pt is returning call.  

## 2015-06-23 ENCOUNTER — Encounter: Payer: Self-pay | Admitting: Family Medicine

## 2015-06-23 ENCOUNTER — Ambulatory Visit (INDEPENDENT_AMBULATORY_CARE_PROVIDER_SITE_OTHER): Payer: PPO | Admitting: Family Medicine

## 2015-06-23 VITALS — BP 163/93 | HR 57 | Temp 97.6°F | Ht 73.0 in | Wt 181.4 lb

## 2015-06-23 DIAGNOSIS — E039 Hypothyroidism, unspecified: Secondary | ICD-10-CM

## 2015-06-23 DIAGNOSIS — E785 Hyperlipidemia, unspecified: Secondary | ICD-10-CM

## 2015-06-23 DIAGNOSIS — G20 Parkinson's disease: Secondary | ICD-10-CM | POA: Diagnosis not present

## 2015-06-23 DIAGNOSIS — I10 Essential (primary) hypertension: Secondary | ICD-10-CM | POA: Diagnosis not present

## 2015-06-23 DIAGNOSIS — Z0000 Encounter for general adult medical examination without abnormal findings: Secondary | ICD-10-CM

## 2015-06-23 LAB — COMPREHENSIVE METABOLIC PANEL
ALBUMIN: 4.6 g/dL (ref 3.5–5.2)
ALK PHOS: 42 U/L (ref 39–117)
ALT: 3 U/L (ref 0–53)
AST: 21 U/L (ref 0–37)
BILIRUBIN TOTAL: 0.9 mg/dL (ref 0.2–1.2)
BUN: 17 mg/dL (ref 6–23)
CO2: 32 mEq/L (ref 19–32)
CREATININE: 1.02 mg/dL (ref 0.40–1.50)
Calcium: 9.6 mg/dL (ref 8.4–10.5)
Chloride: 101 mEq/L (ref 96–112)
GFR: 74.64 mL/min (ref >60.00)
GLUCOSE: 91 mg/dL (ref 70–99)
Potassium: 4.8 mEq/L (ref 3.5–5.1)
SODIUM: 139 meq/L (ref 135–145)
TOTAL PROTEIN: 7.3 g/dL (ref 6.0–8.3)

## 2015-06-23 LAB — CBC WITH DIFFERENTIAL/PLATELET
Basophils Absolute: 0 10*3/uL (ref 0.0–0.1)
Basophils Relative: 0.3 % (ref 0.0–3.0)
EOS ABS: 0.2 10*3/uL (ref 0.0–0.7)
EOS PCT: 2.7 % (ref 0.0–5.0)
HCT: 43.3 % (ref 39.0–52.0)
HEMOGLOBIN: 14.6 g/dL (ref 13.0–17.0)
LYMPHS ABS: 2.3 10*3/uL (ref 0.7–4.0)
Lymphocytes Relative: 30.5 % (ref 12.0–46.0)
MCHC: 33.7 g/dL (ref 30.0–36.0)
MCV: 92.9 fl (ref 78.0–100.0)
MONO ABS: 0.5 10*3/uL (ref 0.1–1.0)
Monocytes Relative: 7 % (ref 3.0–12.0)
NEUTROS PCT: 59.5 % (ref 43.0–77.0)
Neutro Abs: 4.6 10*3/uL (ref 1.4–7.7)
Platelets: 219 10*3/uL (ref 150.0–400.0)
RBC: 4.66 Mil/uL (ref 4.22–5.81)
RDW: 12.9 % (ref 11.5–15.5)
WBC: 7.7 10*3/uL (ref 4.0–10.5)

## 2015-06-23 LAB — LIPID PANEL
CHOLESTEROL: 133 mg/dL (ref 0–200)
HDL: 44.3 mg/dL (ref >39.00)
LDL CALC: 76 mg/dL (ref 0–99)
NONHDL: 88.9
TRIGLYCERIDES: 67 mg/dL (ref 0.0–149.0)
Total CHOL/HDL Ratio: 3
VLDL: 13.4 mg/dL (ref 0.0–40.0)

## 2015-06-23 LAB — POCT URINALYSIS DIPSTICK
Bilirubin, UA: NEGATIVE
Glucose, UA: NEGATIVE
Ketones, UA: NEGATIVE
Leukocytes, UA: NEGATIVE
NITRITE UA: NEGATIVE
PH UA: 6
Protein, UA: NEGATIVE
RBC UA: NEGATIVE
Spec Grav, UA: 1.02
UROBILINOGEN UA: NEGATIVE

## 2015-06-23 NOTE — Patient Instructions (Signed)

## 2015-06-23 NOTE — Progress Notes (Signed)
Subjective:   William Williamson is a 82 y.o. male who presents for Medicare Annual/Subsequent preventive examination.  Review of Systems:   Review of Systems  Constitutional: Negative for activity change, appetite change and fatigue.  HENT: Negative for hearing loss, congestion, tinnitus and ear discharge.   Eyes: Negative for visual disturbance (see optho q1y -- vision corrected to 20/20 with glasses).  Respiratory: Negative for cough, chest tightness and shortness of breath.   Cardiovascular: Negative for chest pain, palpitations and leg swelling.  Gastrointestinal: Negative for abdominal pain, diarrhea, constipation and abdominal distention.  Genitourinary: Negative for urgency, frequency, decreased urine volume and difficulty urinating.  Musculoskeletal: Negative for back pain, arthralgias and gait problem.  Skin: Negative for color change, pallor and rash.  Neurological: Negative for dizziness, light-headedness, numbness and headaches.  Hematological: Negative for adenopathy. Does not bruise/bleed easily.  Psychiatric/Behavioral: Negative for suicidal ideas, confusion, sleep disturbance, self-injury, dysphoric mood, decreased concentration and agitation.  Pt is able to read and write and can do all ADLs No risk for falling No abuse/ violence in home          Objective:    Vitals: BP 163/93 mmHg  Pulse 57  Temp(Src) 97.6 F (36.4 C) (Oral)  Ht 6\' 1"  (1.854 m)  Wt 181 lb 6.4 oz (82.283 kg)  BMI 23.94 kg/m2  SpO2 98%  Body mass index is 23.94 kg/(m^2). BP 163/93 mmHg  Pulse 57  Temp(Src) 97.6 F (36.4 C) (Oral)  Ht 6\' 1"  (1.854 m)  Wt 181 lb 6.4 oz (82.283 kg)  BMI 23.94 kg/m2  SpO2 98% General appearance: alert, cooperative, appears stated age and no distress Head: Normocephalic, without obvious abnormality, atraumatic Eyes: conjunctivae/corneas clear. PERRL, EOM's intact. Fundi benign. Ears: normal TM's and external ear canals both ears Nose: Nares normal.  Septum midline. Mucosa normal. No drainage or sinus tenderness. Throat: lips, mucosa, and tongue normal; teeth and gums normal Neck: no adenopathy, no carotid bruit, no JVD, supple, symmetrical, trachea midline and thyroid not enlarged, symmetric, no tenderness/mass/nodules Back: symmetric, no curvature. ROM normal. No CVA tenderness. Lungs: clear to auscultation bilaterally Chest wall: no tenderness Heart: regular rate and rhythm, S1, S2 normal, no murmur, click, rub or gallop Abdomen: soft, non-tender; bowel sounds normal; no masses,  no organomegaly Male genitalia: normal Rectal: normal tone, normal prostate, no masses or tenderness Extremities: extremities normal, atraumatic, no cyanosis or edema Pulses: 2+ and symmetric Skin: Skin color, texture, turgor normal. No rashes or lesions Lymph nodes: Cervical, supraclavicular, and axillary nodes normal. Neurologic: Alert and oriented X 3, normal strength and tone. Normal symmetric reflexes. Normal coordination and gait Psych-  No depression, no anxiety Tobacco History  Smoking status  . Former Smoker  . Quit date: 04/02/1968  Smokeless tobacco  . Never Used     Counseling given: Not Answered   Past Medical History  Diagnosis Date  . Arthritis   . Hypercholesteremia   . GERD (gastroesophageal reflux disease)   . Glaucoma     Dr Bing Plume  . Hyperthyroidism     s/p RAI  . Anemia 1952    post Oromycin for [place]  . Hearing loss in right ear   . Anisocoria     post op  . Parkinson's disease [facility])    Past Surgical History  Procedure Laterality Date  . Septoplasty  1970  . Cardiac catheterization  07/2006    Dr.  Eustace Quail, negative  . Vasectomy    . Rai ablation  02/2008    hyperthyroidism  . Cataract extraction, bilateral      OS retinal surgery for post op floaters  . Colonoscopy      negative X 3   Family History  Problem Relation Age of Onset  . Osteoporosis Mother   . Hypertension Sister   . Hypertension  Brother      X 3  . Diabetes Brother   . Prostate cancer Brother   . Stomach cancer Maternal Grandmother   . Pancreatic cancer Brother     ???  . Heart attack Maternal Grandfather 17  . Stroke Neg Hx    History  Sexual Activity  . Sexual Activity: Not on file    Outpatient Encounter Prescriptions as of 06/23/2015  Medication Sig  . aspirin 81 MG tablet Take 81 mg by mouth daily.    . carbidopa-levodopa (SINEMET IR) 25-100 MG per tablet Take 2 tablets in the morning, 1 in the afternoon, 2 in the evening (Patient taking differently: Take 1 tablet by mouth 3 (three) times daily. )  . cephALEXin (KEFLEX) 500 MG capsule Take 1 capsule (500 mg total) by mouth 3 (three) times daily.  . Cholecalciferol 2000 UNITS CAPS Take 1 capsule by mouth daily.   . dorzolamide-timolol (COSOPT) 22.3-6.8 MG/ML ophthalmic solution Place 1 drop into both eyes 2 (two) times daily.   . fish oil-omega-3 fatty acids 1000 MG capsule Take 2 g by mouth daily.    Marland Kitchen latanoprost (XALATAN) 0.005 % ophthalmic solution 1 drop at bedtime.  Marland Kitchen levothyroxine (SYNTHROID, LEVOTHROID) 100 MCG tablet TAKE ONE TABLET BY MOUTH ONCE DAILY  . lisinopril (PRINIVIL,ZESTRIL) 5 MG tablet Take 5 mg by mouth daily.  . polyethylene glycol (MIRALAX / GLYCOLAX) packet Take 17 g by mouth daily.  . simvastatin (ZOCOR) 20 MG tablet TAKE ONE TABLET BY MOUTH IN THE EVENING   No facility-administered encounter medications on file as of 06/23/2015.    Activities of Daily Living In your present state of health, do you have any difficulty performing the following activities: 06/23/2015 06/23/2015  Hearing? Tempie Donning  Vision? N N  Difficulty concentrating or making decisions? N N  Walking or climbing stairs? Y N  Dressing or bathing? N N  Doing errands, shopping? N N    Patient Care Team: Rosalita Chessman, DO as PCP - General (Family Medicine) Allyn Kenner, MD as Consulting Physician (Dermatology) Eustace Quail Tat, DO as Consulting Physician (Neurology)     Assessment:    cpe Exercise Activities and Dietary recommendations--- 30 min at least 3x a week Current Exercise Habits: Home exercise routine, Type of exercise: walking, Time (Minutes): > 60, Frequency (Times/Week): >7, Weekly Exercise (Minutes/Week): 0, Intensity: Mild, Exercise limited by: neurologic condition(s)  Goals    None     Fall Risk Fall Risk  06/23/2015 06/23/2015 10/12/2014 06/21/2014 06/19/2013  Falls in the past year? Yes Yes No No Yes  Number falls in past yr: 1 1 - - 2 or more  Injury with Fall? No No - - -  Risk for fall due to : Impaired balance/gait;Medication side effect - - - -  Follow up Education provided - - - -   Depression Screen PHQ 2/9 Scores 06/23/2015 06/23/2015 06/21/2014 06/19/2013  PHQ - 2 Score 0 0 0 0    Cognitive Testing mmse 30/30  Immunization History  Administered Date(s) Administered  . Influenza Split 01/09/2011, 01/15/2012  . Influenza Whole 03/11/2007, 01/09/2008, 01/26/2009, 01/23/2010  . Influenza, High Dose Seasonal PF  01/22/2013, 12/23/2014  . Influenza,inj,Quad PF,36+ Mos 12/21/2013  . Pneumococcal Conjugate-13 06/21/2014  . Pneumococcal Polysaccharide-23 06/19/2013  . Td 07/07/2008  . Zoster 06/21/2014   Screening Tests Health Maintenance  Topic Date Due  . INFLUENZA VACCINE  11/01/2015  . TETANUS/TDAP  07/08/2018  . ZOSTAVAX  Completed  . PNA vac Low Risk Adult  Completed      Plan:    During the course of the visit the patient was educated and counseled about the following appropriate screening and preventive services:   Vaccines to include Pneumoccal, Influenza, Hepatitis B, Td, Zostavax, HCV  Electrocardiogram  Cardiovascular Disease  Colorectal cancer screening  Diabetes screening  Prostate Cancer Screening  Glaucoma screening  Nutrition counseling   Smoking cessation counseling  Patient Instructions (the written plan) was given to the patient.   1. Hypothyroidism, unspecified hypothyroidism  type Check labs  con't synthroid  - Comprehensive metabolic panel - CBC with Differential/Platelet - Lipid panel - POCT urinalysis dipstick - PSA - TSH  2. Essential hypertension tcon't lisinopril Only started back 3 days ago. Pt will watch bp F/u 2-3 weeks or sooner prn  - Comprehensive metabolic panel - CBC with Differential/Platelet - Lipid panel - POCT urinalysis dipstick - PSA - TSH  3. Parkinson disease ([place]) Per neuro - Comprehensive metabolic panel - CBC with Differential/Platelet - Lipid panel - POCT urinalysis dipstick - PSA - TSH  4. Hyperlipemia con't statin - Comprehensive metabolic panel - CBC with Differential/Platelet - Lipid panel - POCT urinalysis dipstick - PSA - TSH  5. Routine history and physical examination of adult See above  Garnet Koyanagi, DO  06/26/2015

## 2015-06-23 NOTE — Progress Notes (Signed)
Pre visit review using our clinic review tool, if applicable. No additional management support is needed unless otherwise documented below in the visit note. 

## 2015-06-24 LAB — PSA: PSA: 4.01 ng/mL — AB (ref 0.10–4.00)

## 2015-06-24 LAB — TSH: TSH: 2.39 u[IU]/mL (ref 0.35–4.50)

## 2015-06-29 DIAGNOSIS — H401131 Primary open-angle glaucoma, bilateral, mild stage: Secondary | ICD-10-CM | POA: Diagnosis not present

## 2015-07-01 ENCOUNTER — Other Ambulatory Visit: Payer: Self-pay | Admitting: Family Medicine

## 2015-07-01 DIAGNOSIS — R972 Elevated prostate specific antigen [PSA]: Secondary | ICD-10-CM

## 2015-07-11 ENCOUNTER — Ambulatory Visit: Payer: PPO | Admitting: Family Medicine

## 2015-07-18 ENCOUNTER — Ambulatory Visit (INDEPENDENT_AMBULATORY_CARE_PROVIDER_SITE_OTHER): Payer: PPO | Admitting: Family Medicine

## 2015-07-18 ENCOUNTER — Encounter: Payer: Self-pay | Admitting: Family Medicine

## 2015-07-18 VITALS — BP 132/80 | HR 50 | Temp 97.7°F | Ht 73.0 in | Wt 181.0 lb

## 2015-07-18 DIAGNOSIS — I10 Essential (primary) hypertension: Secondary | ICD-10-CM

## 2015-07-18 MED ORDER — LISINOPRIL 5 MG PO TABS: 5.0000 mg | ORAL_TABLET | Freq: Every day | ORAL | Status: DC

## 2015-07-18 NOTE — Patient Instructions (Signed)
Hypertension Hypertension, commonly called high blood pressure, is when the force of blood pumping through your arteries is too strong. Your arteries are the blood vessels that carry blood from your heart throughout your body. A blood pressure reading consists of a higher number over a lower number, such as 110/72. The higher number (systolic) is the pressure inside your arteries when your heart pumps. The lower number (diastolic) is the pressure inside your arteries when your heart relaxes. Ideally you want your blood pressure below 120/80. Hypertension forces your heart to work harder to pump blood. Your arteries may become narrow or stiff. Having untreated or uncontrolled hypertension can cause heart attack, stroke, kidney disease, and other problems. RISK FACTORS Some risk factors for high blood pressure are controllable. Others are not.  Risk factors you cannot control include:   Race. You may be at higher risk if you are African American.  Age. Risk increases with age.  Gender. Men are at higher risk than women before age 45 years. After age 65, women are at higher risk than men. Risk factors you can control include:  Not getting enough exercise or physical activity.  Being overweight.  Getting too much fat, sugar, calories, or salt in your diet.  Drinking too much alcohol. SIGNS AND SYMPTOMS Hypertension does not usually cause signs or symptoms. Extremely high blood pressure (hypertensive crisis) may cause headache, anxiety, shortness of breath, and nosebleed. DIAGNOSIS To check if you have hypertension, your health care provider will measure your blood pressure while you are seated, with your arm held at the level of your heart. It should be measured at least twice using the same arm. Certain conditions can cause a difference in blood pressure between your right and left arms. A blood pressure reading that is higher than normal on one occasion does not mean that you need treatment. If  it is not clear whether you have high blood pressure, you may be asked to return on a different day to have your blood pressure checked again. Or, you may be asked to monitor your blood pressure at home for 1 or more weeks. TREATMENT Treating high blood pressure includes making lifestyle changes and possibly taking medicine. Living a healthy lifestyle can help lower high blood pressure. You may need to change some of your habits. Lifestyle changes may include:  Following the DASH diet. This diet is high in fruits, vegetables, and whole grains. It is low in salt, red meat, and added sugars.  Keep your sodium intake below 2,300 mg per day.  Getting at least 30-45 minutes of aerobic exercise at least 4 times per week.  Losing weight if necessary.  Not smoking.  Limiting alcoholic beverages.  Learning ways to reduce stress. Your health care provider may prescribe medicine if lifestyle changes are not enough to get your blood pressure under control, and if one of the following is true:  You are 18-59 years of age and your systolic blood pressure is above 140.  You are 60 years of age or older, and your systolic blood pressure is above 150.  Your diastolic blood pressure is above 90.  You have diabetes, and your systolic blood pressure is over 140 or your diastolic blood pressure is over 90.  You have kidney disease and your blood pressure is above 140/90.  You have heart disease and your blood pressure is above 140/90. Your personal target blood pressure may vary depending on your medical conditions, your age, and other factors. HOME CARE INSTRUCTIONS    Have your blood pressure rechecked as directed by your health care provider.   Take medicines only as directed by your health care provider. Follow the directions carefully. Blood pressure medicines must be taken as prescribed. The medicine does not work as well when you skip doses. Skipping doses also puts you at risk for  problems.  Do not smoke.   Monitor your blood pressure at home as directed by your health care provider. SEEK MEDICAL CARE IF:   You think you are having a reaction to medicines taken.  You have recurrent headaches or feel dizzy.  You have swelling in your ankles.  You have trouble with your vision. SEEK IMMEDIATE MEDICAL CARE IF:  You develop a severe headache or confusion.  You have unusual weakness, numbness, or feel faint.  You have severe chest or abdominal pain.  You vomit repeatedly.  You have trouble breathing. MAKE SURE YOU:   Understand these instructions.  Will watch your condition.  Will get help right away if you are not doing well or get worse.   This information is not intended to replace advice given to you by your health care provider. Make sure you discuss any questions you have with your health care provider.   Document Released: 03/19/2005 Document Revised: 08/03/2014 Document Reviewed: 01/09/2013 Elsevier Interactive Patient Education 2016 [employer]  

## 2015-07-18 NOTE — Progress Notes (Signed)
Patient ID: William Williamson, male    DOB: Nov 14, 1932  Age: 82 y.o. MRN: [id number]    Subjective:  Subjective HPI William Williamson presents for bp check .  No new complaints.    Review of Systems  Constitutional: Negative for diaphoresis, appetite change, fatigue and unexpected weight change.  Eyes: Negative for pain, redness and visual disturbance.  Respiratory: Negative for cough, chest tightness, shortness of breath and wheezing.   Cardiovascular: Negative for chest pain, palpitations and leg swelling.  Endocrine: Negative for cold intolerance, heat intolerance, polydipsia, polyphagia and polyuria.  Genitourinary: Negative for dysuria, frequency and difficulty urinating.  Neurological: Negative for dizziness, light-headedness, numbness and headaches.    History Past Medical History  Diagnosis Date  . Arthritis   . Hypercholesteremia   . GERD (gastroesophageal reflux disease)   . Glaucoma     Dr Bing Plume  . Hyperthyroidism     s/p RAI  . Anemia 1952    post Oromycin for [place]  . Hearing loss in right ear   . Anisocoria     post op  . Parkinson's disease ([place])     He has past surgical history that includes Septoplasty (1970); Cardiac catheterization (07/2006); Vasectomy; RAI ablation (02/2008); Cataract extraction, bilateral; and Colonoscopy.   His family history includes Diabetes in his brother; Heart attack (age of onset: 78) in his maternal grandfather; Hypertension in his brother and sister; Osteoporosis in his mother; Pancreatic cancer in his brother; Prostate cancer in his brother; Stomach cancer in his maternal grandmother. There is no history of Stroke.He reports that he quit smoking about 47 years ago. He has never used smokeless tobacco. He reports that he does not drink alcohol or use illicit drugs.  Current Outpatient Prescriptions on File Prior to Visit  Medication Sig Dispense Refill  . aspirin 81 MG tablet Take 81 mg by mouth daily.      .  carbidopa-levodopa (SINEMET IR) 25-100 MG per tablet Take 2 tablets in the morning, 1 in the afternoon, 2 in the evening (Patient taking differently: Take 1 tablet by mouth 3 (three) times daily. ) 450 tablet 3  . Cholecalciferol 2000 UNITS CAPS Take 1 capsule by mouth daily.     . dorzolamide-timolol (COSOPT) 22.3-6.8 MG/ML ophthalmic solution Place 1 drop into both eyes 2 (two) times daily.     . fish oil-omega-3 fatty acids 1000 MG capsule Take 2 g by mouth daily.      Marland Kitchen latanoprost (XALATAN) 0.005 % ophthalmic solution 1 drop at bedtime.    Marland Kitchen levothyroxine (SYNTHROID, LEVOTHROID) 100 MCG tablet TAKE ONE TABLET BY MOUTH ONCE DAILY 90 tablet 1  . polyethylene glycol (MIRALAX / GLYCOLAX) packet Take 17 g by mouth daily.    . simvastatin (ZOCOR) 20 MG tablet TAKE ONE TABLET BY MOUTH IN THE EVENING 90 tablet 0   No current facility-administered medications on file prior to visit.     Objective:  Objective Physical Exam  Constitutional: He is oriented to person, place, and time. Vital signs are normal. He appears well-developed and well-nourished. He is sleeping.  HENT:  Head: Normocephalic and atraumatic.  Mouth/Throat: Oropharynx is clear and moist.  Eyes: EOM are normal. Pupils are equal, round, and reactive to light.  Neck: Normal range of motion. Neck supple. No thyromegaly present.  Cardiovascular: Normal rate and regular rhythm.   No murmur heard. Pulmonary/Chest: Effort normal and breath sounds normal. No respiratory distress. He has no wheezes. He has no rales. He exhibits no  tenderness.  Musculoskeletal: He exhibits no edema or tenderness.  Neurological: He is alert and oriented to person, place, and time.  Skin: Skin is warm and dry.  Psychiatric: He has a normal mood and affect. His behavior is normal. Judgment and thought content normal.  Nursing note and vitals reviewed.  BP 132/80 mmHg  Pulse 50  Temp(Src) 97.7 F (36.5 C) (Oral)  Ht 6\' 1"  (1.854 m)  Wt 181 lb (82.101  kg)  BMI 23.89 kg/m2  SpO2 98% Wt Readings from Last 3 Encounters:  07/18/15 181 lb (82.101 kg)  06/23/15 181 lb 6.4 oz (82.283 kg)  05/03/15 185 lb (83.915 kg)     Lab Results  Component Value Date   WBC 7.7 06/23/2015   HGB 14.6 06/23/2015   HCT 43.3 06/23/2015   PLT 219.0 06/23/2015   GLUCOSE 91 06/23/2015   CHOL 133 06/23/2015   TRIG 67.0 06/23/2015   HDL 44.30 06/23/2015   LDLCALC 76 06/23/2015   ALT 3 06/23/2015   AST 21 06/23/2015   NA 139 06/23/2015   K 4.8 06/23/2015   CL 101 06/23/2015   CREATININE 1.02 06/23/2015   BUN 17 06/23/2015   CO2 32 06/23/2015   TSH 2.39 06/23/2015   PSA 4.01* 06/23/2015   INR 0.9 RATIO 07/26/2006   HGBA1C 5.9 10/09/2006    Dg Eye Foreign Body  02/13/2013  CLINICAL DATA:  Metal working/exposure; clearance prior to MRI EXAM: ORBITS FOR FOREIGN BODY - 2 VIEW COMPARISON:  None. FINDINGS: There is no evidence of metallic foreign body within the orbits. No significant bone abnormality identified. IMPRESSION: No evidence of metallic foreign body within the orbits. Electronically Signed   By: Lahoma Crocker M.D.   On: 02/13/2013 12:16   Mr Jeri Cos [id number] Contrast  02/13/2013  CLINICAL DATA:  Parkinson like symptoms. Trembling of the right arm. Headache. BUN and creatinine were obtained on site at [facility] at [address] Results:  BUN 12 mg/dL,  Creatinine 1.0 mg/dL. EXAM: MRI HEAD WITHOUT AND WITH CONTRAST TECHNIQUE: Multiplanar, multiecho pulse sequences of the brain and surrounding structures were obtained without and with intravenous contrast. CONTRAST:  17 cc MultiHance COMPARISON:  None. FINDINGS: Diffusion imaging does not show any acute or subacute infarction. The cerebellum is normal. In the substantia nigra region, I believe there is an abnormal appearance with absence of the normal swallow tail appearance. This goes along with the clinical diagnosis. The cerebral hemispheres show mild age related atrophy with mild small  vessel change in the deep white matter. No cortical or large vessel territory infarction. No mass lesion, hemorrhage, hydrocephalus or extra-axial collection. IMPRESSION: No reversible intracranial finding. No acute infarction. Minimal chronic small vessel change of the deep white matter. Absent swallow tail sign goes along with the clinical diagnosis of Parkinson's disease. Electronically Signed   By: Nelson Chimes M.D.   On: 02/13/2013 15:35     Assessment & Plan:  Plan I have discontinued Mr. Jeffus's cephALEXin. I have also changed his lisinopril. Additionally, I am having him maintain his aspirin, fish oil-omega-3 fatty acids, Cholecalciferol, dorzolamide-timolol, polyethylene glycol, carbidopa-levodopa, levothyroxine, latanoprost, and simvastatin.  Meds ordered this encounter  Medications  . lisinopril (PRINIVIL,ZESTRIL) 5 MG tablet    Sig: Take 1 tablet (5 mg total) by mouth daily.    Dispense:  90 tablet    Refill:  3    Problem List Items Addressed This Visit      Unprioritized   Essential hypertension -  Primary   Relevant Medications   lisinopril (PRINIVIL,ZESTRIL) 5 MG tablet    stable-- con't meds, recheck 3 months  Follow-up: Return in about 6 months (around 01/17/2016), or if symptoms worsen or fail to improve, for hypertension, hyperlipidemia, fasting.  [name], DO

## 2015-07-18 NOTE — Progress Notes (Signed)
Pre visit review using our clinic review tool, if applicable. No additional management support is needed unless otherwise documented below in the visit note. 

## 2015-08-01 ENCOUNTER — Other Ambulatory Visit: Payer: Self-pay | Admitting: Family Medicine

## 2015-08-16 ENCOUNTER — Other Ambulatory Visit: Payer: Self-pay | Admitting: Family Medicine

## 2015-09-19 DIAGNOSIS — R972 Elevated prostate specific antigen [PSA]: Secondary | ICD-10-CM | POA: Diagnosis not present

## 2015-09-19 DIAGNOSIS — R3915 Urgency of urination: Secondary | ICD-10-CM | POA: Diagnosis not present

## 2015-09-30 ENCOUNTER — Encounter: Payer: Self-pay | Admitting: Neurology

## 2015-09-30 ENCOUNTER — Ambulatory Visit (INDEPENDENT_AMBULATORY_CARE_PROVIDER_SITE_OTHER): Payer: PPO | Admitting: Neurology

## 2015-09-30 VITALS — BP 112/76 | HR 58 | Ht 73.0 in | Wt 182.0 lb

## 2015-09-30 DIAGNOSIS — K117 Disturbances of salivary secretion: Secondary | ICD-10-CM

## 2015-09-30 DIAGNOSIS — G20 Parkinson's disease: Secondary | ICD-10-CM

## 2015-09-30 NOTE — Progress Notes (Signed)
William Williamson Certain was seen today in the movement disorders clinic for neurologic consultation at the request of [name], DO.  The consultation is for the evaluation of left hand tremor and weakness.  The pt is 82 y.o. male R hand dominant.  He states that he got bit by many ticks in the summer and got headaches.  He then noted L hand tremor some time later (about 1 month ago).  The pt thought that it was lyme disease and he was told that he did not have lyme disease and did not have RMSF.  He notes L hand tremor most when sitting down.  He does not notice it when he is moving.  He has no tremor in the L leg or on the R side   03/31/13 update:  The patient presents today for followup.  He was diagnosed with mild Parkinson's disease on 02/03/2013.  He is currently on no medications.  He does think that tremor is somewhat more prominent but it really doesn't bother him.  When he shaves with the R hand, he may note some tremor on the L.  Otherwise, its more like he can feel it rather than see it.  He had an MRI of the brain since last visit.  There was an absent "swallow to tail sign" which can be seen with PD.  He is exercising.  No falls.  No balance problems.  No syncope.  No n/v, visual distortions or hallucinations.  07/28/13 update:  The patient is seen somewhat earlier than expected.  He does have history of Parkinson's disease.  He has not wanted to be on any medications.  He called recently and said that tremor, which was previously only on the left had spread to the right hand.  The right hand shakes more than the L now.  The L seems "docile" now.    One fall.  Pt was on the bicycle and the dog was on the leash and he fell off the bike.  He walks 3 miles every AM.  In addition, he was concerned because he felt like he had a "sunburn" on his scalp.  He did recently see his primary care physician.  I reviewed those notes.  He was started on Valtrex on April 21 for possible herpetic neuralgia.   He did have shingles vaccine previously.  Is feeling better in that regard.  10/06/13 update:  Pt was started on carbidopa/levodopa 25/100 last visit. He takes it at 6:30/4-5pm/11pm.   He no longer has tremor with ambulation on the R but does still note the tremor with resting the hand in the lap while watching TV.  No falls.  No hallucinations.  No lightheadedness or near syncope.  He is walking 3 miles per day and biking a mile.  He is having some constipation.  04/08/14 update:  The patient is following up today regarding his Parkinson's disease.  He is supposed to be on carbidopa/levodopa 25/100, one tablet 3 times per day but the large majority of the time he only takes it bid and misses the middle of the day dose.  I did review records available to me since last visit.  He saw cardiology on 02/08/2014 for fluctuating blood pressure.  His blood pressure was somewhat elevated in the office and he subsequently had an echocardiogram that demonstrated normal LV ejection fraction with moderate septal hypertrophy.  He was started on amlodipine.  Unfortunatley it caused near syncope and he states that something  else was called in but he hasn't picked it up.  Per records, lisinopril was called in yesterday.  He is also awaiting a nephrology appt for hyperkalemia.  He is having some fatigue.  10/12/14 update:  The patient is seen today back in follow-up, accompanied by his son who supplements the history.  He is on carbidopa/levodopa 25/100, one tablet 3 times per day.  He has trouble remembering the middle of the day dosage.   He has been seeing Dr. Meda Coffee in regards to his high blood pressure.  He had near syncope with amlodipine and was changed to lisinopril.  He is now on 5 mg of lisinopril and doing well and he saw her in May and was doing so well that she recommended a follow-up in one year.  He was treated for an H. pylori infection since our last visit and did see gastroenterology.  Records that were available  to me were reviewed.  His biggest issue is drooling and he wakes up in pools of drool.    01/12/15 update:  The patient is f/u today.  Last visit, I increased his carbidopa/levodopa 25/100 to 2/1/2 but he thought that the medication potentially was making him feel bad so he decreased it back to 1 po tid, although he admits the middle of the day dose 1/2 the time.  He only was up on the increased dosage for a week and then he dropped down on it.  He had trouble describing how he felt "bad" but just stated that he had a "dull headache."  He doesn't describe nausea.  He does think that the higher dose definitely helped the tremor.  No falls since last visit.  He has been exercising daily.  He is still drooling.  He does not wish to pursue myobloc.  He went off lisinopril due to lightheadedness and he feels much better.  Asks me about melanoma risk with levodopa.  05/03/15 update:  The patient follows up today, accompanied by his wife who supplements the history.  He is on carbidopa/levodopa 25/100, one tablet 3 times per day (7am/12pm/5:30pm).  Last visit, I felt that he was under dosed, but he did not want to increase the medication because when I did that in the past he thought that that caused headache, although I was not convinced that it was from the medication.  He had one fall since last visit; he was taking a 3 mile walk and about a mile from his home he fell in the ditch and hurt his foot, which is better now.  He has not had hallucinations.  No lightheadedness or near syncope.  He worries about SE of levodopa and worries about it causing glaucoma and melanoma (he read about those things).  Not long after our last visit he saw his orthopedic surgeon at [facility] and he told the patient that he felt that he had a right L5 radiculopathy and he was told to come to me for treatment.  It was recommended to him that he try Neurontin or Lyrica, but he was also told that I prescribed the medication.  I called  the patient to discuss this, as I had no knowledge of his symptoms and he told me he wanted to wait until this visit discussing it, even though this visit was several months from his visit from his orthopedic surgeon.   Pt states that he is no longer having back or radicular sx's.  Does again c/o sialorrhea.  09/30/15 update:  The patient follows up today, accompanied by his wife who supplements the history.  He was on carbidopa/levodopa 25/100, one tablet 3 times per day but increased that to 2/1/2 which is how it was prescribed a long time ago but he did it shortly.  The last few visits, I felt that he was under dosed, but he did not want to increase the medication because when I did that in the past he thought that that caused headache, although I was not convinced that it was from the medication.  He has not had any falls since our last visit.  He has not had hallucinations.  No lightheadedness or near syncope.  "I'm still drooling like crazy."  Still doesn't want to do the botox.    PREVIOUS MEDICATIONS: none to date  ALLERGIES:   Allergies  Allergen Reactions  . Garlic     nausea  . Niacin     REACTION: flushing    CURRENT MEDICATIONS:  Current Outpatient Prescriptions on File Prior to Visit  Medication Sig Dispense Refill  . aspirin 81 MG tablet Take 81 mg by mouth every other day.     . carbidopa-levodopa (SINEMET IR) 25-100 MG per tablet Take 2 tablets in the morning, 1 in the afternoon, 2 in the evening (Patient taking differently: Take 1 tablet by mouth 3 (three) times daily. ) 450 tablet 3  . Cholecalciferol 2000 UNITS CAPS Take 1 capsule by mouth daily.     . dorzolamide-timolol (COSOPT) 22.3-6.8 MG/ML ophthalmic solution Place 1 drop into both eyes 2 (two) times daily.     . fish oil-omega-3 fatty acids 1000 MG capsule Take 2 g by mouth daily.      Marland Kitchen latanoprost (XALATAN) 0.005 % ophthalmic solution 1 drop at bedtime.    Marland Kitchen levothyroxine (SYNTHROID, LEVOTHROID) 100 MCG tablet TAKE  ONE TABLET BY MOUTH ONCE DAILY 90 tablet 3  . lisinopril (PRINIVIL,ZESTRIL) 5 MG tablet Take 1 tablet (5 mg total) by mouth daily. (Patient taking differently: Take 2.5 mg by mouth daily. ) 90 tablet 3  . polyethylene glycol (MIRALAX / GLYCOLAX) packet Take 17 g by mouth daily.    . simvastatin (ZOCOR) 20 MG tablet TAKE ONE TABLET BY MOUTH IN THE EVENING 90 tablet 1   No current facility-administered medications on file prior to visit.    PAST MEDICAL HISTORY:   Past Medical History  Diagnosis Date  . Arthritis   . Hypercholesteremia   . GERD (gastroesophageal reflux disease)   . Glaucoma     Dr Bing Plume  . Hyperthyroidism     s/p RAI  . Anemia 1952    post Oromycin for [place]  . Hearing loss in right ear   . Anisocoria     post op  . Parkinson's disease ([place])     PAST SURGICAL HISTORY:   Past Surgical History  Procedure Laterality Date  . Septoplasty  1970  . Cardiac catheterization  07/2006    Dr.  Eustace Quail, negative  . Vasectomy    . Rai ablation  02/2008    hyperthyroidism  . Cataract extraction, bilateral      OS retinal surgery for post op floaters  . Colonoscopy      negative X 3    SOCIAL HISTORY:   Social History   Social History  . Marital Status: Widowed    Spouse Name: N/A  . Number of Children: N/A  . Years of Education: N/A   Occupational History  . Retired  personnel   Social History Main Topics  . Smoking status: Former Smoker    Quit date: 04/02/1968  . Smokeless tobacco: Never Used  . Alcohol Use: No     Comment: 2 beers/day  . Drug Use: No  . Sexual Activity: Not on file   Other Topics Concern  . 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 Father Deceased 11    natural causes  . Mother Deceased     "old age"  . Brother Deceased     2, train accident; ? [place]  . Brother Alive   . Sister Alive     3, degen arthritis (bedridden at  80 y/o); dementia  . Child Alive     3, alive and well    ROS:  A complete 10 system review of systems was obtained and was unremarkable apart from what is mentioned above.  PHYSICAL EXAMINATION:    VITALS:   Filed Vitals:   09/30/15 1004  BP: 112/76  Pulse: 58  Height: 6\' 1"  (1.854 m)  Weight: 182 lb (82.555 kg)    GEN:  The patient appears stated age and is in NAD. HEENT:  Normocephalic, atraumatic.  The mucous membranes are moist. The superficial temporal arteries are without ropiness or tenderness. CV:  RRR Lungs:  CTAB Neck/HEME:  There are no carotid bruits bilaterally.  Neurological examination:  Orientation: The patient is alert and oriented x3. Fund of knowledge is appropriate.  Recent and remote memory are intact.  Attention and concentration are normal.    Able to name objects and repeat phrases. Cranial nerves: There is good facial symmetry.  There is facial hypomimia.   The speech is fluent and clear. Soft palate rises symmetrically and there is no tongue deviation. Hearing is decreased to conversational tone. Motor: Strength is 5/5 in the bilateral upper and lower extremities.   Shoulder shrug is equal and symmetric.  There is no pronator drift.  Movement examination: Tone: There is good tone today in the UE Abnormal movements: There is independent RUE/LUE resting tremor, R more than L.   Coordination:  There is no decremation with rapid alternating movements today  Gait and Station: The patient has no difficulty arising out of a deep-seated chair without the use of the hands. The patient's stride length is normal but there is increase in bilateral UE resting tremor and decreased arm swing bilaterally.  ASSESSMENT/PLAN:  1.  Parkinsons disease, Hoehn and Yahr stage 2.5  - He went back up on the carbidopa/levodopa 25/100 and is on 2/1/2 now.  Less rigidity but still has tremor.  Not that bothersome for the patient and would advise against adding more med for this  and he agrees.    -discussed exercise in detail; he is walking but would like to see him add in stationary bike.  He has one at home but isn't using it. 2.  Tinnitus  -I told him that this is not associated with PD and is likely due to hearing loss.  Pt admits that this started when began to have problems with hearing 3.  constipation, associated with Parkinson's disease  -Using generic form of miralax now (clearlax now) 4.  Hypertension  -he had gone off of lisinopril but reports he is only taking 2.5 mg now.  Restarted it when found out that brother had cancer.  He is free  of dizziness now.  Wonder if still needs lisinopril but may be using for something other than BP control.   5.  Sialorrhea  -Talked about myobloc but he doesn't wish to pursue myobloc. Discussed again today.  Talked to him again about atropine 0.1% opthalmic but he wants to hold on that for now. 6.  Follow up is anticipated in the next 4 months, sooner should new neurologic issues arise.  Much greater than 50% of this visit was spent in counseling with the patient and the family.  Total face to face time:  25 min

## 2015-10-12 DIAGNOSIS — Z1283 Encounter for screening for malignant neoplasm of skin: Secondary | ICD-10-CM | POA: Diagnosis not present

## 2015-10-12 DIAGNOSIS — L821 Other seborrheic keratosis: Secondary | ICD-10-CM | POA: Diagnosis not present

## 2015-10-12 DIAGNOSIS — L570 Actinic keratosis: Secondary | ICD-10-CM | POA: Diagnosis not present

## 2015-10-12 DIAGNOSIS — X32XXXD Exposure to sunlight, subsequent encounter: Secondary | ICD-10-CM | POA: Diagnosis not present

## 2015-12-06 ENCOUNTER — Encounter: Payer: Self-pay | Admitting: Cardiology

## 2015-12-06 ENCOUNTER — Telehealth: Payer: Self-pay | Admitting: Cardiology

## 2015-12-06 NOTE — Telephone Encounter (Signed)
Pt calling in with complaints of low BP and slight dizziness.  Pt states that is lowest BP reading was 101/60.  Pt states he had an increase in his parkinson's medication (Sinemet) back in June, and that's the only med he can correlate that would cause his BP to decrease. Asked the pt if he is still taking lisinopril 5 mg po daily, for this is listed on his med list.  Pt states he is "absolutely not taking this med, for it once almost killed me." Pt appears to be a poor historian with what meds he's taking. Pt states that within the last week or so, when he attempts to exert and exercise, he feels very fatigued and states "I feel so worn out."  Pt states "I feel more sob when exercising, then usual." Pt states he recently had a UTI, but has been treated for this, and symptoms improved.  Pt states he hasn't seen Dr Meda Coffee in over a year, for he states "I don't think your office ever called me to have this scheduled."   Looked in the pts chart, and he was recalled to see Dr Meda Coffee back in 08/2015.  Informed the pt that Dr Meda Coffee has a cancellation for tomorrow 12/07/15, at 1045, and we can see him then for his issues.  Advised the pt that he should report to our office at 1030, and bring his current medications and list.  Advised the pt that in the meantime, until we see him tomorrow, he should increase his po fluid intake, and avoid exercise for the day.  Advised the pt to avoid heat exposure for the day as well.  Informed the pt that I will route this message to Dr Meda Coffee, to make her aware of his complaint and that he will be coming into the office tomorrow to see her for this.  Pt verbalized understanding, agrees with this plan, and gracious for all the assistance provided.   Will send chart prep a message, to inform them of this add on pt.

## 2015-12-06 NOTE — Telephone Encounter (Signed)
Pt c/o BP issue: STAT if pt c/o blurred vision, one-sided weakness or slurred speech  1. What are your last 5 BP readings? 100/60  2. Are you having any other symptoms (ex. Dizziness, headache, blurred vision, passed out)? Vision blurred  3. What is your BP issue? high

## 2015-12-07 ENCOUNTER — Encounter: Payer: Self-pay | Admitting: Family Medicine

## 2015-12-07 ENCOUNTER — Encounter: Payer: Self-pay | Admitting: Cardiology

## 2015-12-07 ENCOUNTER — Ambulatory Visit (INDEPENDENT_AMBULATORY_CARE_PROVIDER_SITE_OTHER): Payer: PPO | Admitting: Family Medicine

## 2015-12-07 ENCOUNTER — Ambulatory Visit (INDEPENDENT_AMBULATORY_CARE_PROVIDER_SITE_OTHER): Payer: PPO | Admitting: Cardiology

## 2015-12-07 VITALS — BP 132/68 | HR 72 | Ht 73.0 in | Wt 181.0 lb

## 2015-12-07 VITALS — BP 100/68 | HR 76 | Temp 98.2°F | Ht 73.0 in | Wt 181.4 lb

## 2015-12-07 DIAGNOSIS — S70362A Insect bite (nonvenomous), left thigh, initial encounter: Secondary | ICD-10-CM

## 2015-12-07 DIAGNOSIS — W57XXXA Bitten or stung by nonvenomous insect and other nonvenomous arthropods, initial encounter: Secondary | ICD-10-CM

## 2015-12-07 DIAGNOSIS — E7800 Pure hypercholesterolemia, unspecified: Secondary | ICD-10-CM | POA: Diagnosis not present

## 2015-12-07 DIAGNOSIS — I951 Orthostatic hypotension: Secondary | ICD-10-CM

## 2015-12-07 DIAGNOSIS — G20 Parkinson's disease: Secondary | ICD-10-CM

## 2015-12-07 DIAGNOSIS — G909 Disorder of the autonomic nervous system, unspecified: Secondary | ICD-10-CM

## 2015-12-07 DIAGNOSIS — I10 Essential (primary) hypertension: Secondary | ICD-10-CM

## 2015-12-07 DIAGNOSIS — T148 Other injury of unspecified body region: Secondary | ICD-10-CM | POA: Diagnosis not present

## 2015-12-07 LAB — SEDIMENTATION RATE: SED RATE: 2 mm/h (ref 0–20)

## 2015-12-07 MED ORDER — MIDODRINE HCL 5 MG PO TABS: 5.0000 mg | ORAL_TABLET | Freq: Three times a day (TID) | ORAL | 2 refills | Status: DC

## 2015-12-07 NOTE — Progress Notes (Signed)
Pre visit review using our clinic review tool, if applicable. No additional management support is needed unless otherwise documented below in the visit note. 

## 2015-12-07 NOTE — Progress Notes (Signed)
Chief Complaint  Patient presents with  . Tick Removal    romoved the tick on (11/29/15)-pt stated that had fever which started on yest, and today fever(100.2),along with nausea,weak, and ha    Subjective: Patient is a 82 y.o. male here for a tick bite.  His brother pulled a tick off his L thigh last week. There was a local reaction, but it is essentially healed now. He notes it did not look like a target sign. He started experiencing nausea, fevers, and fatigue. He is worried he has an illness related to a tick bite. No rashes otherwise noted. No joint pain.  ROS: Skin: No other rashes other than site where tick bit him MSK: No joint pain  Family History  Problem Relation Age of Onset  . Osteoporosis Mother   . Hypertension Sister   . Hypertension Brother      X 3  . Diabetes Brother   . Prostate cancer Brother   . Stomach cancer Maternal Grandmother   . Pancreatic cancer Brother     ???  . Heart attack Maternal Grandfather 82  . Stroke Neg Hx    Past Medical History:  Diagnosis Date  . Anemia 1952   post Oromycin for [place]  . Anisocoria    post op  . Arthritis   . GERD (gastroesophageal reflux disease)   . Glaucoma    Dr Bing Plume  . Hearing loss in right ear   . Hypercholesteremia   . Hyperthyroidism    s/p RAI  . Parkinson's disease ([place])    Allergies  Allergen Reactions  . Garlic     nausea  . Niacin     REACTION: flushing    Current Outpatient Prescriptions:  .  aspirin 81 MG tablet, Take 81 mg by mouth every other day. , Disp: , Rfl:  .  carbidopa-levodopa (SINEMET IR) 25-100 MG per tablet, Take 2 tablets in the morning, 1 in the afternoon, 2 in the evening (Patient taking differently: Take 2 tablets in the morning, 1 in the afternoon, 1 in the evening), Disp: 450 tablet, Rfl: 3 .  Cholecalciferol 2000 UNITS CAPS, Take 1 capsule by mouth daily. , Disp: , Rfl:  .  dorzolamide-timolol (COSOPT) 22.3-6.8 MG/ML ophthalmic solution, Place 1 drop into both eyes  2 (two) times daily. , Disp: , Rfl:  .  fish oil-omega-3 fatty acids 1000 MG capsule, Take 2 g by mouth daily.  , Disp: , Rfl:  .  latanoprost (XALATAN) 0.005 % ophthalmic solution, 1 drop at bedtime., Disp: , Rfl:  .  levothyroxine (SYNTHROID, LEVOTHROID) 100 MCG tablet, TAKE ONE TABLET BY MOUTH ONCE DAILY, Disp: 90 tablet, Rfl: 3 .  polyethylene glycol (MIRALAX / GLYCOLAX) packet, Take 17 g by mouth daily., Disp: , Rfl:  .  simvastatin (ZOCOR) 20 MG tablet, TAKE ONE TABLET BY MOUTH IN THE EVENING, Disp: 90 tablet, Rfl: 1  Objective: BP 100/68 (BP Location: Left Arm, Patient Position: Sitting, Cuff Size: Normal)   Pulse 76   Temp 98.2 F (36.8 C) (Oral)   Ht '6\' 1"'  (1.854 m)   Wt 181 lb 6.4 oz (82.3 kg)   SpO2 95%   BMI 23.93 kg/m  General: Awake, appears stated age HEENT: MMM, no erythema or exudates appreciated, EOMi, ears neg Heart: RRR, no murmurs Lungs: CTAB, no rales, wheezes or rhonchi. Normal effort Skin: Medial left thigh, proximally there is an area of erythema with an excoriated center. No evidence of a target lesion. No drainage. Neuro: +  resting tremor; poor facial expression Psych: Age appropriate judgment and insight, normal affect and mood  Assessment and Plan: Tick bite - Plan: Lyme Ab/Western Blot Reflex, Sed Rate (ESR)  Orders as above. In absence of target lesion, will hold off on empiric tx. If labs positive, will start doxy 100 mg BID for 3 weeks. F/u prn. The patient voiced understanding and agreement to the plan.  Crosby Oyster [place]

## 2015-12-07 NOTE — Progress Notes (Signed)
Patient ID: BHAVESH CHURCH, male   DOB: [birth year], 82 y.o.   MRN: [id number]    Patient Name: William Williamson Date of Encounter: 12/07/2015  Primary Care Provider:  [name], DO Primary Cardiologist:  Ena Dawley  Problem List   Past Medical History:  Diagnosis Date  . Anemia 1952   post Oromycin for [place]mia  . Anisocoria    post op  . Arthritis   . GERD (gastroesophageal reflux disease)   . Glaucoma    Dr Bing Plume  . Hearing loss in right ear   . Hypercholesteremia   . Hyperthyroidism    s/p RAI  . Parkinson's disease [facility])    Past Surgical History:  Procedure Laterality Date  . CARDIAC CATHETERIZATION  07/2006   Dr.  Eustace Quail, negative  . CATARACT EXTRACTION, BILATERAL     OS retinal surgery for post op floaters  . COLONOSCOPY     negative X 3  . RAI ablation  02/2008   hyperthyroidism  . SEPTOPLASTY  1970  . VASECTOMY      Allergies  Allergies  Allergen Reactions  . Garlic     nausea  . Niacin     REACTION: flushing   Chief complaint: Hypotension, dizziness, nausea.  HPI  A pleasant 82 year old with history of hypertension, hyperlipidemia, hearing loss and Parkinson's disease who is being referred by his primary care physician for a high blood pressure. Patient was diagnosed with high blood pressure and started on ramipril however his blood pressure was very low and he felt dizzy so he discontinued. He states that when he is active his blood pressure goes down and is normal and his heart times a day taking any medicines. He is able to work in his yard for about 4 hours a day without any shortness of breath or chest pain. He feels dizzy occasionally. No palpitations or syncope. No lower extremity edema. No claudications.  04/17/2014 - patient is coming after 2 months. He underwent echocardiogram that showed mild concentric LVH with moderate distal septal hypertrophy. He was started on amlodipine 2.5 mg daily. The patient states that  he was tolerating it well at first but afterwards we will all on on episodes of dizziness and eye fatigue and he stopped taking it. We prescribed lisinopril 2.5 instead he so far tolerating it very well. He denies any chest pain, lower extremity edema or shortness of breath.  08/05/14 - this is 2 months follow-up, at the last visit patient saw Richardson Dopp, the patient was started on amlodipine that he didn't tolerate as his blood pressure dropped too much, consequently his blood pressure was in 160s, so he was started on lisinopril 2.5 mg daily daily was later increased to 5 mg daily. He has been checking his blood pressure at home and it's almost always controlled. The patient denies chest pain or shortness of breath. He has been compliant to his medicines and denies any side effects. He denies any palpitations or syncope no dizziness. No lower extremity edema.  12/07/2015 - the patient is coming after 3 months, this is an urgent visit as he called yesterday that he feels dizzy. He states that he has been feeling like this for couple months it's mostly orthostatic whenever he stands off and he feels like he is going to pass out. He stopped taking lisinopril with no significant improvement. He also noticed that about 2 weeks ago he had a tick bite on his left leg that his son  removed. He said it was unusually red but didn't see both eyes appearance. Yesterday he had a fever of 101 and developed nausea that he still persistent.  Home Medications  Prior to Admission medications   Medication Sig Start Date End Date Taking? Authorizing Provider  aspirin 81 MG tablet Take 81 mg by mouth daily.     Yes Historical Provider, MD  bimatoprost (LUMIGAN) 0.03 % ophthalmic solution Place 1 drop into both eyes at bedtime.   Yes Historical Provider, MD  carbidopa-levodopa (SINEMET IR) 25-100 MG per tablet Take 1 tablet by mouth 3 (three) times daily. 07/28/13  Yes Rebecca S Tat, DO  Cholecalciferol 2000 UNITS CAPS Take  by mouth daily.     Yes Historical Provider, MD  Coenzyme Q10 300 MG CAPS Take by mouth daily.     Yes Historical Provider, MD  dorzolamide-timolol (COSOPT) 22.3-6.8 MG/ML ophthalmic solution Place 1 drop into both eyes 2 (two) times daily.  05/14/13  Yes Historical Provider, MD  fish oil-omega-3 fatty acids 1000 MG capsule Take 2 g by mouth daily.     Yes Historical Provider, MD  Flaxseed, Linseed, (FLAX SEED OIL) 1000 MG CAPS Take by mouth daily.     Yes Historical Provider, MD  levothyroxine (SYNTHROID, LEVOTHROID) 88 MCG tablet Take 1 tablet (88 mcg total) by mouth daily. 12/25/13  Yes Rosalita Chessman, DO  simvastatin (ZOCOR) 20 MG tablet Take 1 tablet (20 mg total) by mouth every evening. 06/19/13  Yes Rosalita Chessman, DO    Family History  Family History  Problem Relation Age of Onset  . Osteoporosis Mother   . Hypertension Sister   . Hypertension Brother      X 3  . Diabetes Brother   . Prostate cancer Brother   . Stomach cancer Maternal Grandmother   . Pancreatic cancer Brother     ???  . Heart attack Maternal Grandfather 49  . Stroke Neg Hx    Social History  Social History   Social History  . Marital status: Widowed    Spouse name: N/A  . Number of children: N/A  . Years of education: N/A   Occupational History  . Retired     Probation officer   Social History Main Topics  . Smoking status: Former Smoker    Quit date: 04/02/1968  . Smokeless tobacco: Never Used  . Alcohol use No     Comment: 2 beers/day  . Drug use: No  . Sexual activity: Not on file   Other Topics Concern  . Not on file   Social History Narrative   Son Kris-Power of Attorney     Review of Systems, as per HPI, otherwise negative General:  No chills, fever, night sweats or weight changes.  Cardiovascular:  No chest pain, dyspnea on exertion, edema, orthopnea, palpitations, paroxysmal nocturnal dyspnea. Dermatological: No rash, lesions/masses Respiratory: No cough, dyspnea Urologic: No hematuria,  dysuria Abdominal:   No nausea, vomiting, diarrhea, bright red blood per rectum, melena, or hematemesis Neurologic:  No visual changes, wkns, changes in mental status. All other systems reviewed and are otherwise negative except as noted above.  Physical Exam  Blood pressure 132/68, pulse 72, height 6\' 1"  (1.854 m), weight 181 lb (82.1 kg).  General: Pleasant, NAD Psych: Normal affect. Neuro: Alert and oriented X 3. Moves all extremities spontaneously. HEENT: Normal  Neck: Supple without bruits or JVD. Lungs:  Resp regular and unlabored, CTA. Heart: RRR no s3, s4, or murmurs. Abdomen: Soft, non-tender, non-distended,  BS + x 4.  Extremities: No clubbing, cyanosis or edema. DP/PT/Radials 2+ and equal bilaterally.  Labs:  No results for input(s): CKTOTAL, CKMB, TROPONINI in the last 72 hours. Lab Results  Component Value Date   WBC 7.7 06/23/2015   HGB 14.6 06/23/2015   HCT 43.3 06/23/2015   MCV 92.9 06/23/2015   PLT 219.0 06/23/2015    No results found for: DDIMER Invalid input(s): POCBNP    Component Value Date/Time   NA 139 06/23/2015 1053   K 4.8 06/23/2015 1053   CL 101 06/23/2015 1053   CO2 32 06/23/2015 1053   GLUCOSE 91 06/23/2015 1053   BUN 17 06/23/2015 1053   CREATININE 1.02 06/23/2015 1053   CALCIUM 9.6 06/23/2015 1053   PROT 7.3 06/23/2015 1053   ALBUMIN 4.6 06/23/2015 1053   AST 21 06/23/2015 1053   ALT 3 06/23/2015 1053   ALKPHOS 42 06/23/2015 1053   BILITOT 0.9 06/23/2015 1053   GFRNONAA 62.87 06/02/2009 1244   GFRAA 94 01/09/2008 0000   Lab Results  Component Value Date   CHOL 133 06/23/2015   HDL 44.30 06/23/2015   LDLCALC 76 06/23/2015   TRIG 67.0 06/23/2015    Accessory Clinical Findings  Echocardiogram - 02/10/2014  - Left ventricle: The cavity size was normal. Wall thickness was increased in a pattern of mild LVH. There was moderate focal basal hypertrophy of the septum. Systolic function was normal. The estimated ejection  fraction was in the range of 60% to 65%. Wall motion was normal; there were no regional wall motion abnormalities. Doppler parameters are consistent with abnormal left ventricular relaxation (grade 1 diastolic dysfunction). The E/e &' ratio is between 8-15, suggesting indeterminate LV filling pressure. - Aortic valve: Poorly visualized. Sclerosis without stenosis. There was mild regurgitation. - Aorta: Aortic root dimension: 42 mm (ED). - Aortic root: The aortic root is dilated. - Mitral valve: Posterior calcified annulus. There was mild regurgitation. - Atrial septum: Possibly aneurysmal, not well visualzied. No indication of PFO by color doppler. - Tricuspid valve: There was mild regurgitation. - Pulmonary arteries: PA peak pressure: 23 mm Hg (S).  Impressions:  - LVEF 60-65%, mild LVH with moderate focal basal septal hypertrophy, aortic valve sclerosis, dilated sinotubular junction measuring 4.2 cm, mild central AI, posterior MAC, mild MR.  ECG - sinus rhythm, normal EKG. unchanged from prior.   Assessment & Plan  82 year old male  1. Orthostatic hypotension, autonomic dysfunction, the patient has developed signs of autonomic dysfunction in the settings of Parkinson disease. This is very calm on with patient with Parkinson disease, even though he is having acute febrile illness possibly Lyme disease however his symptoms started way before he had his tick bite. We will discontinue lisinopril, start midodrine 5 mg by mouth 3 times a day and bring him back in 2 weeks for adjustment in needed if this approach fails we will apply for Northera that is very efficient in patients with Parkinson disease.  2. Acute febrile illness post tick bite, patient is offered a course of doxycycline however he would like to have testing done, he is going to his primary care physician straight from her office for further evaluation and management.  3. Hypertension - mild LVH with  moderate focal basal septal hypertrophy on echocardiogram, however currently secondary to Parkinson disease he has developed autonomic dysfunction with profound orthostatic hypotension we will discontinue all his blood pressure meds.  4. Hyperlipidemia - on simvastatin 20, all lipids are at goal. He is compliant  with his medicines and has no side effects liver function tests were normal.  Follow-up in 2 weeks.  Ena Dawley, MD, [facility] 12/07/2015, 11:10 AM

## 2015-12-07 NOTE — Patient Instructions (Signed)
Medication Instructions:   START TAKING MIDODRINE 5 MG THREE TIMES DAILY WITH MEALS     Follow-Up:  2 WEEKS IN OUR [facility] CLINIC WITH [facility]GAN SUPPLE PHARMACIST FOR START OF MIDODRINE FOR AUTONOMIC DYSFUNCTION      If you need a refill on your cardiac medications before your next appointment, please call your pharmacy.

## 2015-12-07 NOTE — Patient Instructions (Addendum)
Lyme Disease  Lyme disease is an infection that affects many parts of the body, including the skin, joints, and nervous system.  CAUSES  Lyme disease is caused by bacteria called Borrelia burgdorferi.  You can get Lyme disease by being bitten by an infected tick. The tick must be attached to your skin for at least 36 hours to transmit the infection. Deer often carry infected ticks.  RISK FACTORS  · Living in or visiting [place], the [place] states, or the [place].  · Spending time in wooded or grassy areas.  · Being outdoors with exposed skin.  · Failing to remove a tick from your skin within 3-4 days.  SIGNS AND SYMPTOMS  · A round, red rash that comes out from the center of the tick bite. This is the first sign of infection. The center of the rash may be blood colored or have tiny blisters.  · Fatigue.  · Headache.  · Chills and fever.  · General achiness.  · Joint pain, often in the knee.  · Swollen lymph glands.  DIAGNOSIS  Lyme disease is diagnosed with a medical history, physical exam, and blood test.  TREATMENT  The main treatment is antibiotic medicine, usually taken by mouth. The length of treatment depends on how soon after a tick bite you begin taking the medicine. In some cases, treatment is necessary for several weeks. If the infection is severe, IV antibiotics may be necessary.  HOME CARE INSTRUCTIONS  · Take your antibiotic medicine as directed by your health care provider. Finish the antibiotic even if you start to feel better.  · You may take a probiotic in between doses of your antibiotic to help avoid stomach upset or diarrhea.  · Check with your health care provider before supplementing your treatment. Many alternative therapies have not been proven and may be harmful to you.  · Keep all follow-up visits as directed by your health care provider. This is important.  PREVENTION  Reinfection is possible with another tick bite by an infected tick. Take these precautions to prevent an  infection:  · Cover your skin with light-colored clothing when outdoors in the spring and summer months.  · Spray clothing and skin with bug spray. The spray should be 20-30% DEET.  · Avoided wooded, grassy, and shaded areas.  · Remove yard litter, brush, trash, and plants that attract deer and rodents.  · Check yourself for ticks when you come indoors.  · Wash clothing worn each day.  · Check your pets for ticks before they come inside.  · If you find a tick:    Remove it with tweezers.    Clean your hands and the bite area with rubbing alcohol or soap and water.  Pregnant women should take special care to avoid tick bites because the infection can be passed along to the fetus.  SEEK MEDICAL CARE IF:  · You have symptoms after treatment.  · You have removed a tick and want to bring it to your health care provider for testing.  SEEK IMMEDIATE MEDICAL CARE IF:  · You have an irregular heartbeat.  · You have nerve pain.  · Your face feels numb.  MAKE SURE YOU:  · Understand these instructions.  · Will watch your condition.  · Will get help right away if you are not doing well or get worse.     This information is not intended to replace advice given to you by your health care provider. Make 

## 2015-12-08 LAB — LYME AB/WESTERN BLOT REFLEX

## 2015-12-09 ENCOUNTER — Encounter: Payer: Self-pay | Admitting: Family Medicine

## 2015-12-09 ENCOUNTER — Ambulatory Visit (INDEPENDENT_AMBULATORY_CARE_PROVIDER_SITE_OTHER): Payer: PPO | Admitting: Family Medicine

## 2015-12-09 VITALS — BP 100/60 | Temp 97.5°F | Ht 73.0 in | Wt 178.2 lb

## 2015-12-09 DIAGNOSIS — K529 Noninfective gastroenteritis and colitis, unspecified: Secondary | ICD-10-CM

## 2015-12-09 MED ORDER — ONDANSETRON HCL 4 MG PO TABS: 4.0000 mg | ORAL_TABLET | Freq: Three times a day (TID) | ORAL | 0 refills | Status: DC | PRN

## 2015-12-09 NOTE — Progress Notes (Addendum)
Chief Complaint  Patient presents with  . Diarrhea    started this am-also nausea  . Fever    started last Wed (comes and goes)  . Fatigue    started last Wed    William Williamson here for fatigue and now diarrhea. His son was also present.   Duration: 2 days Associated symptoms: subjective fever; nausea, poor appetite/oral intake, diarrhea started today. Denies: cough, SOB, rash, sore throat, nasal congestion, vomiting, bleeding Treatment to date: none Sick contacts: No  He was bit by a tick and had neg Lyme labs.  ROS:   Const: +fevers HEENT: Neg GI: +diarrhea, +nausea, no vomitting Lungs: No SOB  Past Medical History:  Diagnosis Date  . Anemia 1952   post Oromycin for [place]mia  . Anisocoria    post op  . Arthritis   . GERD (gastroesophageal reflux disease)   . Glaucoma    Dr Bing Plume  . Hearing loss in right ear   . Hypercholesteremia   . Hyperthyroidism    s/p RAI  . Parkinson's disease ([place])    Family History  Problem Relation Age of Onset  . Osteoporosis Mother   . Hypertension Sister   . Hypertension Brother      X 3  . Diabetes Brother   . Prostate cancer Brother   . Stomach cancer Maternal Grandmother   . Pancreatic cancer Brother     ???  . Heart attack Maternal Grandfather 70  . Stroke Neg Hx     BP 100/60 (BP Location: Left Arm, Patient Position: Sitting, Cuff Size: Normal)   Temp 97.5 F (36.4 C) (Oral)   Ht 6\' 1"  (1.854 m)   Wt 178 lb 3.2 oz (80.8 kg)   BMI 23.51 kg/m  General: Awake, alert, appears stated age HEENT: AT, [place], ears patent b/l and TM's neg, nares patent w/o discharge, pharynx pink and without exudates, MMM Neck: No masses or asymmetry Heart: RRR, no murmurs, no bruits Lungs: CTAB, no accessory muscle use Abd: Mild TTP diffusely, non-distended, Neg Murphy's and McBurney's, BS+ Neuro: +resting tremor Psych: Normal mood and affect  Gastroenteritis - Plan: ondansetron (ZOFRAN) 4 MG tablet, Ova and parasite examination,  Stool Culture  Orders as above. If he is still having diarrhea on Monday, he should return for with his stool culture. Gloves also given. I encouraged him to start drinking more fluids and try to eat something. His fatigue and general misery is largely in part due to poor oral intake. My recommendations were also provided in writing. F/u prn. Pt and son voiced understanding and agreement to the plan.  [place], DO 12/09/15 11:50 AM

## 2015-12-09 NOTE — Patient Instructions (Addendum)
Try taking Ensure, Pedialyte or Gatorade (or other sports drink) to get yourself hydrated again. Try to eat. Avoid foods that upset your stomach.  If you start feeling worse and are unable to keep anything down, consider going to an urgent care or an emergency department.   Call on Monday if you are still having diarrhea and let us know. Viral Gastroenteritis Viral gastroenteritis is also known as stomach flu. This condition affects the stomach and intestinal tract. It can cause sudden diarrhea and vomiting. The illness typically lasts 3 to 8 days. Most people develop an immune response that eventually gets rid of the virus. While this natural response develops, the virus can make you quite ill. CAUSES  Many different viruses can cause gastroenteritis, such as rotavirus or noroviruses. You can catch one of these viruses by consuming contaminated food or water. You may also catch a virus by sharing utensils or other personal items with an infected person or by touching a contaminated surface. SYMPTOMS  The most common symptoms are diarrhea and vomiting. These problems can cause a severe loss of body fluids (dehydration) and a body salt (electrolyte) imbalance. Other symptoms may include:  Fever.  Headache.  Fatigue.  Abdominal pain. DIAGNOSIS  Your caregiver can usually diagnose viral gastroenteritis based on your symptoms and a physical exam. A stool sample may also be taken to test for the presence of viruses or other infections. TREATMENT  This illness typically goes away on its own. Treatments are aimed at rehydration. The most serious cases of viral gastroenteritis involve vomiting so severely that you are not able to keep fluids down. In these cases, fluids must be given through an intravenous line (IV). HOME CARE INSTRUCTIONS   Drink enough fluids to keep your urine clear or pale yellow. Drink small amounts of fluids frequently and increase the amounts as tolerated.  Ask your  caregiver for specific rehydration instructions.  Avoid:  Foods high in sugar.  Alcohol.  Carbonated drinks.  Tobacco.  Juice.  Caffeine drinks.  Extremely hot or cold fluids.  Fatty, greasy foods.  Too much intake of anything at one time.  Dairy products until 24 to 48 hours after diarrhea stops.  You may consume probiotics. Probiotics are active cultures of beneficial bacteria. They may lessen the amount and number of diarrheal stools in adults. Probiotics can be found in yogurt with active cultures and in supplements.  Wash your hands well to avoid spreading the virus.  Only take over-the-counter or prescription medicines for pain, discomfort, or fever as directed by your caregiver. Do not give aspirin to children. Antidiarrheal medicines are not recommended.  Ask your caregiver if you should continue to take your regular prescribed and over-the-counter medicines.  Keep all follow-up appointments as directed by your caregiver. SEEK IMMEDIATE MEDICAL CARE IF:   You are unable to keep fluids down.  You do not urinate at least once every 6 to 8 hours.  You develop shortness of breath.  You notice blood in your stool or vomit. This may look like coffee grounds.  You have abdominal pain that increases or is concentrated in one small area (localized).  You have persistent vomiting or diarrhea.  You have a fever.  The patient is a child younger than 3 months, and he or she has a fever.  The patient is a child older than 3 months, and he or she has a fever and persistent symptoms.  The patient is a child older than 3 months, and he  or she has a fever and symptoms suddenly get worse.  The patient is a baby, and he or she has no tears when crying. MAKE SURE YOU:   Understand these instructions.  Will watch your condition.  Will get help right away if you are not doing well or get worse.   This information is not intended to replace advice given to you by your  health care provider. Make sure you discuss any questions you have with your health care provider.   Document Released: 03/19/2005 Document Revised: 06/11/2011 Document Reviewed: 01/03/2011 Elsevier Interactive Patient Education [employer].

## 2015-12-09 NOTE — Progress Notes (Signed)
Pre visit review using our clinic review tool, if applicable. No additional management support is needed unless otherwise documented below in the visit note. 

## 2015-12-12 ENCOUNTER — Telehealth: Payer: Self-pay | Admitting: *Deleted

## 2015-12-12 NOTE — Telephone Encounter (Signed)
Patient and his son William Williamson) called to say that the pt is not feeling any better.   The son stated that the pt came back in on Friday for a follow-up.  The pt was feeling fatigue and having diarrhea.    Pt was asked if the diarrhea continues he was to do stool cultures.  Pt and son stated that the pt is not able to have a bowel movement.  Pt stated that with his Parkinson's Disease it has causes him to have constipation,so he is not able to have a bowel movement.  Pt stated that he is drinking but he does not have an appetite.  Pt's son stated that the pt said this is the worse he has every felt in his life.  The son asked if this is a virus what is the next step.  Informed Dr. Nani Williamson of the above message, per Dr. Nani Williamson if the pt is not doing any better he may need to go to the ER or Urgent Care for IV fluids.  The son and the pt both stated that the pt is urinating.  Informed Dr. Nani Williamson of this.  Per Dr. Nani Williamson have the pt to drink Ensure (2-3/day) to substitute for meals.  Have him to do this for 24 hours if not any better the pt will need to follow-up.  Informed the son and the pt of Dr. Nani Williamson recommendation and the son agreed but stated that the pt's regular doctor is Dr. Etter Williamson and they would like for the pt to see her.  Pt was scheduled with Dr. Carollee Williamson on (Tuesday-12/13/15 @ 1:00pm).//AB/CMA

## 2015-12-13 ENCOUNTER — Encounter: Payer: Self-pay | Admitting: Family Medicine

## 2015-12-13 ENCOUNTER — Ambulatory Visit (INDEPENDENT_AMBULATORY_CARE_PROVIDER_SITE_OTHER): Payer: PPO | Admitting: Family Medicine

## 2015-12-13 ENCOUNTER — Ambulatory Visit (HOSPITAL_BASED_OUTPATIENT_CLINIC_OR_DEPARTMENT_OTHER)
Admission: RE | Admit: 2015-12-13 | Discharge: 2015-12-13 | Disposition: A | Discharge status: Home / Self Care | Payer: PPO | Source: Ambulatory Visit | Attending: Family Medicine | Admitting: Family Medicine

## 2015-12-13 VITALS — BP 112/70 | HR 71 | Temp 98.6°F | Wt 177.4 lb

## 2015-12-13 DIAGNOSIS — R109 Unspecified abdominal pain: Secondary | ICD-10-CM | POA: Insufficient documentation

## 2015-12-13 DIAGNOSIS — K5901 Slow transit constipation: Secondary | ICD-10-CM

## 2015-12-13 DIAGNOSIS — G20 Parkinson's disease: Secondary | ICD-10-CM

## 2015-12-13 DIAGNOSIS — K5900 Constipation, unspecified: Secondary | ICD-10-CM | POA: Diagnosis not present

## 2015-12-13 NOTE — Progress Notes (Signed)
Pre visit review using our clinic review tool, if applicable. No additional management support is needed unless otherwise documented below in the visit note. 

## 2015-12-13 NOTE — Patient Instructions (Signed)

## 2015-12-13 NOTE — Progress Notes (Signed)
Patient ID: William Williamson, male    DOB: 06-22-1932  Age: 82 y.o. MRN: [id number]    Subjective:  Subjective  HPI William Williamson presents for f/u gastroenteritis.  The diarrhea has stopped.  He has not had any bm for 5 days.  He has lost weight and has not been eating.     Review of Systems  Constitutional: Positive for fatigue. Negative for appetite change, diaphoresis and unexpected weight change.  Eyes: Negative for pain, redness and visual disturbance.  Respiratory: Negative for cough, chest tightness, shortness of breath and wheezing.   Cardiovascular: Negative for chest pain, palpitations and leg swelling.  Gastrointestinal: Positive for constipation. Negative for abdominal distention, abdominal pain, diarrhea, nausea and vomiting.  Endocrine: Negative for cold intolerance, heat intolerance, polydipsia, polyphagia and polyuria.  Genitourinary: Negative for difficulty urinating, dysuria and frequency.  Neurological: Negative for dizziness, light-headedness, numbness and headaches.    History Past Medical History:  Diagnosis Date  . Anemia 1952   post Oromycin for [place]  . Anisocoria    post op  . Arthritis   . GERD (gastroesophageal reflux disease)   . Glaucoma    Dr Bing Plume  . Hearing loss in right ear   . Hypercholesteremia   . Hyperthyroidism    s/p RAI  . Parkinson's disease [facility])     He has a past surgical history that includes Septoplasty (1970); Cardiac catheterization (07/2006); Vasectomy; RAI ablation (02/2008); Cataract extraction, bilateral; and Colonoscopy.   His family history includes Diabetes in his brother; Heart attack (age of onset: 25) in his maternal grandfather; Hypertension in his brother and sister; Osteoporosis in his mother; Pancreatic cancer in his brother; Prostate cancer in his brother; Stomach cancer in his maternal grandmother.He reports that he quit smoking about 47 years ago. He has never used smokeless tobacco. He reports that he  does not drink alcohol or use drugs.  Current Outpatient Prescriptions on File Prior to Visit  Medication Sig Dispense Refill  . aspirin 81 MG tablet Take 81 mg by mouth every other day.     . carbidopa-levodopa (SINEMET IR) 25-100 MG per tablet Take 2 tablets in the morning, 1 in the afternoon, 2 in the evening (Patient taking differently: Take 2 tablets in the morning, 1 in the afternoon, 1 in the evening) 450 tablet 3  . Cholecalciferol 2000 UNITS CAPS Take 1 capsule by mouth daily.     . dorzolamide-timolol (COSOPT) 22.3-6.8 MG/ML ophthalmic solution Place 1 drop into both eyes 2 (two) times daily.     . fish oil-omega-3 fatty acids 1000 MG capsule Take 2 g by mouth daily.      Marland Kitchen latanoprost (XALATAN) 0.005 % ophthalmic solution 1 drop at bedtime.    Marland Kitchen levothyroxine (SYNTHROID, LEVOTHROID) 100 MCG tablet TAKE ONE TABLET BY MOUTH ONCE DAILY 90 tablet 3  . polyethylene glycol (MIRALAX / GLYCOLAX) packet Take 17 g by mouth daily.    . simvastatin (ZOCOR) 20 MG tablet TAKE ONE TABLET BY MOUTH IN THE EVENING 90 tablet 1   No current facility-administered medications on file prior to visit.      Objective:  Objective  Physical Exam  Constitutional: He is oriented to person, place, and time. Vital signs are normal. He appears well-developed and well-nourished. He is sleeping.  HENT:  Head: Normocephalic and atraumatic.  Mouth/Throat: Oropharynx is clear and moist.  Eyes: EOM are normal. Pupils are equal, round, and reactive to light.  Neck: Normal range of motion. Neck supple.  No thyromegaly present.  Cardiovascular: Normal rate and regular rhythm.   No murmur heard. Pulmonary/Chest: Effort normal and breath sounds normal. No respiratory distress. He has no wheezes. He has no rales. He exhibits no tenderness.  Abdominal: Soft. Bowel sounds are normal. He exhibits no distension and no mass. There is no tenderness. There is no rebound and no guarding.  Musculoskeletal: He exhibits no edema  or tenderness.  Neurological: He is alert and oriented to person, place, and time.  Skin: Skin is warm and dry.  Psychiatric: He has a normal mood and affect. His behavior is normal. Judgment and thought content normal.  Vitals reviewed.  BP 112/70 (BP Location: Left Arm, Patient Position: Sitting, Cuff Size: Normal)   Pulse 71   Temp 98.6 F (37 C) (Oral)   Wt 177 lb 6.4 oz (80.5 kg)   SpO2 97%   BMI 23.41 kg/m  Wt Readings from Last 3 Encounters:  12/13/15 177 lb 6.4 oz (80.5 kg)  12/09/15 178 lb 3.2 oz (80.8 kg)  12/07/15 181 lb 6.4 oz (82.3 kg)     Lab Results  Component Value Date   WBC 7.7 06/23/2015   HGB 14.6 06/23/2015   HCT 43.3 06/23/2015   PLT 219.0 06/23/2015   GLUCOSE 91 06/23/2015   CHOL 133 06/23/2015   TRIG 67.0 06/23/2015   HDL 44.30 06/23/2015   LDLCALC 76 06/23/2015   ALT 3 06/23/2015   AST 21 06/23/2015   NA 139 06/23/2015   K 4.8 06/23/2015   CL 101 06/23/2015   CREATININE 1.02 06/23/2015   BUN 17 06/23/2015   CO2 32 06/23/2015   TSH 2.39 06/23/2015   PSA 4.01 (H) 06/23/2015   INR 0.9 RATIO 07/26/2006   HGBA1C 5.9 10/09/2006    Dg Eye Foreign Body  Result Date: 02/13/2013 CLINICAL DATA:  Metal working/exposure; clearance prior to MRI EXAM: ORBITS FOR FOREIGN BODY - 2 VIEW COMPARISON:  None. FINDINGS: There is no evidence of metallic foreign body within the orbits. No significant bone abnormality identified. IMPRESSION: No evidence of metallic foreign body within the orbits. Electronically Signed   By: Lahoma Crocker M.D.   On: 02/13/2013 12:16   Mr Jeri Cos [id number] Contrast  Result Date: 02/13/2013 CLINICAL DATA:  Parkinson like symptoms. Trembling of the right arm. Headache. BUN and creatinine were obtained on site at [place] at [address] Results:  BUN 12 mg/dL,  Creatinine 1.0 mg/dL. EXAM: MRI HEAD WITHOUT AND WITH CONTRAST TECHNIQUE: Multiplanar, multiecho pulse sequences of the brain and surrounding structures were obtained  without and with intravenous contrast. CONTRAST:  17 cc MultiHance COMPARISON:  None. FINDINGS: Diffusion imaging does not show any acute or subacute infarction. The cerebellum is normal. In the substantia nigra region, I believe there is an abnormal appearance with absence of the normal swallow tail appearance. This goes along with the clinical diagnosis. The cerebral hemispheres show mild age related atrophy with mild small vessel change in the deep white matter. No cortical or large vessel territory infarction. No mass lesion, hemorrhage, hydrocephalus or extra-axial collection. IMPRESSION: No reversible intracranial finding. No acute infarction. Minimal chronic small vessel change of the deep white matter. Absent swallow tail sign goes along with the clinical diagnosis of Parkinson's disease. Electronically Signed   By: Nelson Chimes M.D.   On: 02/13/2013 15:35     Assessment & Plan:  Plan  I have discontinued Mr. Borkowski's ondansetron. I am also having him maintain his aspirin, fish  oil-omega-3 fatty acids, Cholecalciferol, dorzolamide-timolol, polyethylene glycol, carbidopa-levodopa, latanoprost, levothyroxine, and simvastatin.  No orders of the defined types were placed in this encounter.   Problem List Items Addressed This Visit      Unprioritized   Parkinson disease ([place])    F/u neuro prob cause of constipation       Other Visit Diagnoses    Slow transit constipation    -  Primary   Relevant Orders   DG Abd 1 View (Completed)      Follow-up: No Follow-up on file.  [name], DO

## 2015-12-15 ENCOUNTER — Telehealth: Payer: Self-pay | Admitting: Family Medicine

## 2015-12-15 ENCOUNTER — Ambulatory Visit: Payer: PPO | Admitting: Family Medicine

## 2015-12-15 NOTE — Telephone Encounter (Signed)
No charge. 

## 2015-12-15 NOTE — Assessment & Plan Note (Signed)
F/u neuro prob cause of constipation

## 2015-12-15 NOTE — Telephone Encounter (Signed)
Patient lvm at 12:01pm cancelling 4pm appointment for today. Patient states he is feeling better. Charge or no charge

## 2015-12-16 ENCOUNTER — Ambulatory Visit (INDEPENDENT_AMBULATORY_CARE_PROVIDER_SITE_OTHER): Payer: PPO | Admitting: Podiatry

## 2015-12-16 DIAGNOSIS — L600 Ingrowing nail: Secondary | ICD-10-CM | POA: Diagnosis not present

## 2015-12-16 MED ORDER — CEPHALEXIN 500 MG PO CAPS: 500.0000 mg | ORAL_CAPSULE | Freq: Three times a day (TID) | ORAL | 2 refills | Status: DC

## 2015-12-16 NOTE — Patient Instructions (Signed)

## 2015-12-21 ENCOUNTER — Ambulatory Visit: Payer: PPO

## 2015-12-22 NOTE — Progress Notes (Signed)
Subjective: 82 year old male presents the office to the son for concerns of his right big toenail becoming sore and ingrown along the medial nail border. He gets pain with pressure in shoe gear. Denies any drainage or pus. Area does get swollen and red at times. At this time is requesting the nail corner be removed that he had the other nails removed. Denies any systemic complaints such as fevers, chills, nausea, vomiting. No acute changes since last appointment, and no other complaints at this time.   Objective: AAO x3, NAD DP/PT pulses palpable bilaterally, CRT less than 3 seconds There is incurvation on the medial nail border the right hallux toenail tenderness palpation of this area. There is localized edema and very faint erythema directly on the nail border without any ascending cellulitis. There is no fluctuance or crepitus. There is no malodor. No drainage or pus. No pathology to the other toenails at this time.  No open lesions or pre-ulcerative lesions.  No pain with calf compression, swelling, warmth, erythema  Assessment: 82 year old male right medial hallux symptomatically ingrown toenail   Plan: -All treatment options discussed with the patient including all alternatives, risks, complications.  -At this time, the patient is requesting partial nail removal with chemical matricectomy to the symptomatic portion of the nail. Risks and complications were discussed with the patient for which they understand and  verbally consent to the procedure. Under sterile conditions a total of 3 mL of a mixture of 2% lidocaine plain and 0.5% Marcaine plain was infiltrated in a hallux block fashion. Once anesthetized, the skin was prepped in sterile fashion. A tourniquet was then applied. Next the medial aspect of hallux nail border was then sharply excised making sure to remove the entire offending nail border. Once the nails were ensured to be removed area was debrided and the underlying skin was intact.  There is no purulence identified in the procedure. Next phenol was then applied under standard conditions and copiously irrigated. Silvadene was applied. A dry sterile dressing was applied. After application of the dressing the tourniquet was removed and there is found to be an immediate capillary refill time to the digit. The patient tolerated the procedure well any complications. Post procedure instructions were discussed the patient for which he verbally understood. Follow-up in one week for nail check or sooner if any problems are to arise. Discussed signs/symptoms of infection and directed to call the office immediately should any occur or go directly to the emergency room. In the meantime, encouraged to call the office with any questions, concerns, changes symptoms. -Keflex -Patient encouraged to call the office with any questions, concerns, change in symptoms.   Celesta Gentile, DPM

## 2015-12-26 ENCOUNTER — Encounter: Payer: Self-pay | Admitting: Podiatry

## 2015-12-26 ENCOUNTER — Ambulatory Visit (INDEPENDENT_AMBULATORY_CARE_PROVIDER_SITE_OTHER): Payer: PPO | Admitting: Podiatry

## 2015-12-26 DIAGNOSIS — Z9889 Other specified postprocedural states: Secondary | ICD-10-CM

## 2015-12-26 DIAGNOSIS — L600 Ingrowing nail: Secondary | ICD-10-CM

## 2015-12-26 NOTE — Progress Notes (Signed)
Subjective: William Williamson is a 82 y.o.  male returns to office today for follow up evaluation after having right Hallux partial nail avulsion performed. Patient has been soaking using epsom salts and applying topical antibiotic covered with bandaid daily. Denies any drainage or pus or any redness or swelling. Patient denies fevers, chills, nausea, vomiting. Denies any calf pain, chest pain, SOB.   He is also interested in custom orthotics.  Objective:  Vitals: Reviewed  General: Well developed, nourished, in no acute distress, alert and oriented x3   Dermatology: Skin is warm, dry and supple bilateral. Right hallux nail border appears to be clean, dry, with mild granular tissue and surrounding scab. There is no surrounding erythema, edema, drainage/purulence. The remaining nails appear unremarkable at this time. There are no other lesions or other signs of infection present.  Neurovascular status: Intact. No lower extremity swelling; No pain with calf compression bilateral.  Musculoskeletal: Decreased tenderness to palpation of the right hallux nail fold.. Muscular strength within normal limits bilateral.   Assesement and Plan: S/p partial nail avulsion, doing well.   -Continue soaking in epsom salts twice a day followed by antibiotic ointment and a band-aid. Can leave uncovered at night. Continue this until completely healed.  -If the area has not healed in 2 weeks, call the office for follow-up appointment, or sooner if any problems arise.  -Discussed with him and accommodative custom orthotic , the bony exostosis. He wishes to hold off for now. -Monitor for any signs/symptoms of infection. Call the office immediately if any occur or go directly to the emergency room. Call with any questions/concerns.  Celesta Gentile, DPM

## 2015-12-28 DIAGNOSIS — H401131 Primary open-angle glaucoma, bilateral, mild stage: Secondary | ICD-10-CM | POA: Diagnosis not present

## 2016-01-17 ENCOUNTER — Ambulatory Visit (INDEPENDENT_AMBULATORY_CARE_PROVIDER_SITE_OTHER): Payer: PPO | Admitting: Medical

## 2016-01-17 ENCOUNTER — Ambulatory Visit: Payer: PPO | Admitting: Family Medicine

## 2016-01-17 ENCOUNTER — Telehealth: Payer: Self-pay | Admitting: Medical

## 2016-01-17 ENCOUNTER — Encounter: Payer: Self-pay | Admitting: Medical

## 2016-01-17 VITALS — BP 160/90 | HR 100 | Temp 97.6°F | Ht 73.0 in | Wt 178.6 lb

## 2016-01-17 DIAGNOSIS — Z23 Encounter for immunization: Secondary | ICD-10-CM

## 2016-01-17 DIAGNOSIS — E039 Hypothyroidism, unspecified: Secondary | ICD-10-CM

## 2016-01-17 DIAGNOSIS — I10 Essential (primary) hypertension: Secondary | ICD-10-CM | POA: Diagnosis not present

## 2016-01-17 DIAGNOSIS — Z8679 Personal history of other diseases of the circulatory system: Secondary | ICD-10-CM

## 2016-01-17 DIAGNOSIS — G20 Parkinson's disease: Secondary | ICD-10-CM

## 2016-01-17 DIAGNOSIS — E785 Hyperlipidemia, unspecified: Secondary | ICD-10-CM | POA: Diagnosis not present

## 2016-01-17 LAB — COMPREHENSIVE METABOLIC PANEL
ALT: 3 U/L (ref 0–53)
AST: 17 U/L (ref 0–37)
Albumin: 4.8 g/dL (ref 3.5–5.2)
Alkaline Phosphatase: 49 U/L (ref 39–117)
BILIRUBIN TOTAL: 0.7 mg/dL (ref 0.2–1.2)
BUN: 13 mg/dL (ref 6–23)
CHLORIDE: 101 meq/L (ref 96–112)
CO2: 31 meq/L (ref 19–32)
CREATININE: 1 mg/dL (ref 0.40–1.50)
Calcium: 9.7 mg/dL (ref 8.4–10.5)
GFR: 76.26 mL/min (ref >60.00)
GLUCOSE: 93 mg/dL (ref 70–99)
Potassium: 4.5 mEq/L (ref 3.5–5.1)
SODIUM: 137 meq/L (ref 135–145)
Total Protein: 7.8 g/dL (ref 6.0–8.3)

## 2016-01-17 LAB — TSH: TSH: 1.49 u[IU]/mL (ref 0.35–4.50)

## 2016-01-17 LAB — CBC WITH DIFFERENTIAL/PLATELET
BASOS ABS: 0 10*3/uL (ref 0.0–0.1)
BASOS PCT: 0.3 % (ref 0.0–3.0)
EOS ABS: 0.2 10*3/uL (ref 0.0–0.7)
Eosinophils Relative: 1.8 % (ref 0.0–5.0)
HEMATOCRIT: 43.4 % (ref 39.0–52.0)
Hemoglobin: 14.5 g/dL (ref 13.0–17.0)
LYMPHS PCT: 23.1 % (ref 12.0–46.0)
Lymphs Abs: 2 10*3/uL (ref 0.7–4.0)
MCHC: 33.5 g/dL (ref 30.0–36.0)
MCV: 93.3 fl (ref 78.0–100.0)
MONO ABS: 0.5 10*3/uL (ref 0.1–1.0)
Monocytes Relative: 5.9 % (ref 3.0–12.0)
NEUTROS ABS: 5.9 10*3/uL (ref 1.4–7.7)
NEUTROS PCT: 68.9 % (ref 43.0–77.0)
PLATELETS: 241 10*3/uL (ref 150.0–400.0)
RBC: 4.65 Mil/uL (ref 4.22–5.81)
RDW: 13.8 % (ref 11.5–15.5)
WBC: 8.6 10*3/uL (ref 4.0–10.5)

## 2016-01-17 LAB — LIPID PANEL
CHOL/HDL RATIO: 3
CHOLESTEROL: 143 mg/dL (ref 0–200)
HDL: 43.8 mg/dL (ref >39.00)
LDL CALC: 82 mg/dL (ref 0–99)
NONHDL: 99.49
Triglycerides: 89 mg/dL (ref 0.0–149.0)
VLDL: 17.8 mg/dL (ref 0.0–40.0)

## 2016-01-17 NOTE — Progress Notes (Signed)
Pre visit review using our clinic tool,if applicable. No additional management support is needed unless otherwise documented below in the visit note.  

## 2016-01-17 NOTE — Progress Notes (Signed)
3

## 2016-01-17 NOTE — Patient Instructions (Addendum)
For your parkinsons continue current medication sinemet.  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 Record readings and update [place] on Friday with readings. We need to verify not too high when sitting and not too low standing. Will likely refer you back to cardiologist but reasings at home would be helpful since you are not any bp meds and never took the midrodine.  Today will get flu vaccine.  Will also get cbc, cmp, lipid panel and tsh.  Follow up date to be determined after lab review and bp reading update.(Try to call on Friday but I have sent message to 3 staff members here to give you a call to review you bp readings)

## 2016-01-17 NOTE — Telephone Encounter (Signed)
Will you call pt on oct 20,2017 in am. Ask pt for his sitting bp reading and standing. Asked to check daily. Let me know what readings are on morning of oct 20 th.

## 2016-01-17 NOTE — Progress Notes (Addendum)
Subjective:    Patient ID: William Williamson, male    DOB: Jan 25, 1933, 82 y.o.   MRN: [id number]  HPI  Pt in for follow up.  Pt states he checked his blood pressure yesterday. He thought it was normal reading. He states the highest level that he can remember was [id number] systolic. Pt states usually lower. His pulse is usually normal. Pt thinks most of the time his bp systolic was [id number].    Pt saw cardiologist on 12-07-2015. Pt has history of htn and profound orthostatic hypotension per cardiologist note.  Pt had all bp med discontinued. Pt was given midodrine 5 mg tid. But patient did not take the medication.  Pt also had some fever on 12-06-2015 in context o recent f tick bite. Tick bite studies were negative. Also in sept some loose stools. But that has resolved. No longer having fevers or feeling sick.   Pt has mild elevated psa in past. Pt follows up with uroloigst. They are watching. No urinary obstructive symptoms.    Pt wants flu vaccine today.    Review of Systems  Constitutional: Negative for chills, fatigue and fever.  HENT: Negative for congestion, ear pain and facial swelling.   Respiratory: Negative for cough, chest tightness, shortness of breath and wheezing.   Cardiovascular: Positive for palpitations. Negative for chest pain.  Skin: Negative for rash.  Neurological: Positive for dizziness. Negative for tremors, seizures, syncope, facial asymmetry, weakness, light-headedness, numbness and headaches.       Light headed and dizzy usually on standing.  Feels stable in a minute. He hold on to things before dizziness subsides then ambulates.  Baseline hand tremors.  Hematological: Negative for adenopathy. Does not bruise/bleed easily.  Psychiatric/Behavioral: Negative for behavioral problems, confusion, dysphoric mood and suicidal ideas. The patient is not nervous/anxious.    Past Medical History:  Diagnosis Date  . Anemia 1952   post Oromycin for [place]  . Anisocoria      post op  . Arthritis   . GERD (gastroesophageal reflux disease)   . Glaucoma    Dr Bing Plume  . Hearing loss in right ear   . Hypercholesteremia   . Hyperthyroidism    s/p RAI  . Parkinson's disease [facility])      Social History   Social History  . Marital status: Widowed    Spouse name: N/A  . Number of children: N/A  . Years of education: N/A   Occupational History  . Retired     Probation officer   Social History Main Topics  . Smoking status: Former Smoker    Quit date: 04/02/1968  . Smokeless tobacco: Never Used  . Alcohol use No     Comment: 2 beers/day  . Drug use: No  . Sexual activity: Not on file   Other Topics Concern  . Not on file   Social History Narrative   Son [place]    Past Surgical History:  Procedure Laterality Date  . CARDIAC CATHETERIZATION  07/2006   Dr.  Eustace Quail, negative  . CATARACT EXTRACTION, BILATERAL     OS retinal surgery for post op floaters  . COLONOSCOPY     negative X 3  . RAI ablation  02/2008   hyperthyroidism  . SEPTOPLASTY  1970  . VASECTOMY      Family History  Problem Relation Age of Onset  . Osteoporosis Mother   . Hypertension Sister   . Hypertension Brother      X 3  .  Diabetes Brother   . Prostate cancer Brother   . Stomach cancer Maternal Grandmother   . Pancreatic cancer Brother     ???  . Heart attack Maternal Grandfather 43  . Stroke Neg Hx     Allergies  Allergen Reactions  . Garlic     nausea  . Niacin     REACTION: flushing    Current Outpatient Prescriptions on File Prior to Visit  Medication Sig Dispense Refill  . aspirin 81 MG tablet Take 81 mg by mouth every other day.     . Cholecalciferol 2000 UNITS CAPS Take 1 capsule by mouth daily.     . dorzolamide-timolol (COSOPT) 22.3-6.8 MG/ML ophthalmic solution Place 1 drop into both eyes 2 (two) times daily.     . fish oil-omega-3 fatty acids 1000 MG capsule Take 2 g by mouth daily.      Marland Kitchen latanoprost (XALATAN) 0.005 % ophthalmic  solution 1 drop at bedtime.    Marland Kitchen levothyroxine (SYNTHROID, LEVOTHROID) 100 MCG tablet TAKE ONE TABLET BY MOUTH ONCE DAILY 90 tablet 3  . polyethylene glycol (MIRALAX / GLYCOLAX) packet Take 17 g by mouth daily.    . simvastatin (ZOCOR) 20 MG tablet TAKE ONE TABLET BY MOUTH IN THE EVENING 90 tablet 1  . carbidopa-levodopa (SINEMET IR) 25-100 MG per tablet Take 2 tablets in the morning, 1 in the afternoon, 2 in the evening (Patient not taking: Reported on 01/17/2016) 450 tablet 3   No current facility-administered medications on file prior to visit.     BP (!) 160/90 Comment: 130-120 /70-80 standing(various readings)  Pulse 100   Temp 97.6 F (36.4 C) (Oral)   Ht 6\' 1"  (1.854 m)   Wt 178 lb 9.6 oz (81 kg)   SpO2 96%   BMI 23.56 kg/m       Objective:   Physical Exam  General Mental Status- Alert. General Appearance- Not in acute distress.   Skin General: Color- Normal Color. Moisture- Normal Moisture.  Neck Carotid Arteries- Normal color. Moisture- Normal Moisture. No carotid bruits. No JVD.  Chest and Lung Exam Auscultation: Breath Sounds:-Normal.  Cardiovascular Auscultation:Rythm- Regular. Murmurs & Other Heart Sounds:Auscultation of the heart reveals- No Murmurs.  Abdomen Inspection:-Inspeection Normal. Palpation/Percussion:Note:No mass. Palpation and Percussion of the abdomen reveal- Non Tender, Non Distended + BS, no rebound or guarding.    Neurologic Cranial Nerve exam:- CN III-XII intact(No nystagmus), symmetric smile. Strength:- 5/5 equal and symmetric strength both upper and lower extremities.  Upper ext- mild tremors both sides.      Assessment & Plan:  For your parkinsons continue current medications sinemet.  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 Record readings and update [place] on Friday with readings. We need to verify not  too high when sitting and not too low standing. Will likely refer you back to cardiologist but readings at home would be helpful since you are not any bp meds and never took the midrodine.  Today will get flu vaccine.  Will also get cbc, cmp, lipid panel and tsh.  Follow up date to be determined after lab review and bp reading update.(Try to call on Friday but I have sent message to 3 staff members here to give you a call to review you bp readings)  Kutter Schnepf, Percell Miller, PA-C   Note recheckd bp sittting various times 160/90 reading consistently. In vital sign section all standing  bp reading 120-130/70-80.  Pt has appointment with cardiologist on Thursday.

## 2016-01-19 ENCOUNTER — Ambulatory Visit (INDEPENDENT_AMBULATORY_CARE_PROVIDER_SITE_OTHER): Payer: PPO | Admitting: Cardiology

## 2016-01-19 ENCOUNTER — Encounter: Payer: Self-pay | Admitting: Cardiology

## 2016-01-19 VITALS — BP 174/88 | HR 58 | Ht 73.0 in | Wt 181.8 lb

## 2016-01-19 DIAGNOSIS — G909 Disorder of the autonomic nervous system, unspecified: Secondary | ICD-10-CM

## 2016-01-19 DIAGNOSIS — I10 Essential (primary) hypertension: Secondary | ICD-10-CM | POA: Diagnosis not present

## 2016-01-19 DIAGNOSIS — I951 Orthostatic hypotension: Secondary | ICD-10-CM

## 2016-01-19 DIAGNOSIS — G20A1 Parkinson's disease without dyskinesia, without mention of fluctuations: Secondary | ICD-10-CM

## 2016-01-19 DIAGNOSIS — I952 Hypotension due to drugs: Secondary | ICD-10-CM

## 2016-01-19 DIAGNOSIS — G20 Parkinson's disease: Secondary | ICD-10-CM

## 2016-01-19 MED ORDER — MIDODRINE HCL 5 MG PO TABS: 5.0000 mg | ORAL_TABLET | Freq: Three times a day (TID) | ORAL | 9 refills | Status: DC

## 2016-01-19 NOTE — Patient Instructions (Addendum)
Medication Instructions:  Your physician has recommended you make the following change in your medication:  1. Start Midodrine (5mg  ) three times day   Labwork: None  Testing/Procedures: -None  Follow-Up: Your physician recommends that you keep your scheduled  follow-up appointment with the pharmacist.    Any Other Special Instructions Will Be Listed Below (If Applicable).  Keep log of blood pressures and bring with you on return visit with pharmacist.    If you need a refill on your cardiac medications before your next appointment, please call your pharmacy.

## 2016-01-19 NOTE — Progress Notes (Signed)
Cardiology Office Note   Date:  01/19/2016   ID:  William Williamson, DOB 07-07-1932, MRN [id number]  PCP:  [name], DO  Cardiologist:  Dr. Meda Williamson    Chief Complaint  Patient presents with  . Hypotension      History of Present Illness: William Williamson is a 82 y.o. male who presents for hypotension   He has a history of hypertension, hyperlipidemia, hearing loss and Parkinson's disease who is being referred by his primary care physician for a high blood pressure. Patient was diagnosed with high blood pressure and started on ramipril however his blood pressure was very low and he felt dizzy so he discontinued.  He had a echocardiogram that showed mild concentric LVH with moderate distal septal hypertrophy. He was started on amlodipine 2.5 mg daily. The patient states that he was tolerating it well at first but afterwards we will all on on episodes of dizziness and eye fatigue and he stopped taking it. We prescribed lisinopril 2.5 instead he was tolerating it very well/  Today is being seen after visit last month with Dr. Meda Williamson for hypotension, midodrine was added but pt has never taken.  Lisinopril was stopped.    Today pt reports that though his BP is elevated today at home her runs 120/75 - if he walks any distance he becomes dizzy, his BP is 90 systolic.  Here he is orthostatic.  He haas no chest pain and no SOB. Discussed with Dr. Meda Williamson today.         Past Medical History:  Diagnosis Date  . Anemia 1952   post Oromycin for Tularemia  . Anisocoria    post op  . Arthritis   . GERD (gastroesophageal reflux disease)   . Glaucoma    Dr Bing Plume  . Hearing loss in right ear   . Hypercholesteremia   . Hyperthyroidism    s/p RAI  . Parkinson's disease [facility])     Past Surgical History:  Procedure Laterality Date  . CARDIAC CATHETERIZATION  07/2006   Dr.  Eustace Quail, negative  . CATARACT EXTRACTION, BILATERAL     OS retinal surgery for post op floaters    . COLONOSCOPY     negative X 3  . RAI ablation  02/2008   hyperthyroidism  . SEPTOPLASTY  1970  . VASECTOMY       Current Outpatient Prescriptions  Medication Sig Dispense Refill  . aspirin 81 MG tablet Take 81 mg by mouth every other day.     . carbidopa-levodopa (SINEMET IR) 25-100 MG tablet Take 1 tablet by mouth 3 (three) times daily. TAKE 2 TABLETS BY MOUTH IN THE MORNING, 1 TABLET BY MOUTH IN THE AFTERNOON, 2 TABLETS BY MOUTH IN THE EVENING    . Cholecalciferol 2000 UNITS CAPS Take 1 capsule by mouth daily.     . dorzolamide-timolol (COSOPT) 22.3-6.8 MG/ML ophthalmic solution Place 1 drop into both eyes 2 (two) times daily.     . fish oil-omega-3 fatty acids 1000 MG capsule Take 2 g by mouth daily.      Marland Kitchen latanoprost (XALATAN) 0.005 % ophthalmic solution Place 1 drop into both eyes at bedtime.     Marland Kitchen levothyroxine (SYNTHROID, LEVOTHROID) 100 MCG tablet TAKE ONE TABLET BY MOUTH ONCE DAILY 90 tablet 3  . polyethylene glycol (MIRALAX / GLYCOLAX) packet Take 17 g by mouth daily.    . simvastatin (ZOCOR) 20 MG tablet TAKE ONE TABLET BY MOUTH IN THE EVENING 90  tablet 1  . midodrine (PROAMATINE) 5 MG tablet Take 1 tablet (5 mg total) by mouth 3 (three) times daily with meals. 90 tablet 9   No current facility-administered medications for this visit.     Allergies:   Garlic and Niacin    Social History:  The patient  reports that he quit smoking about 47 years ago. He has never used smokeless tobacco. He reports that he does not drink alcohol or use drugs.   Family History:  The patient's family history includes Diabetes in his brother; Heart attack (age of onset: 13) in his maternal grandfather; Hypertension in his brother and sister; Osteoporosis in his mother; Pancreatic cancer in his brother; Prostate cancer in his brother; Stomach cancer in his maternal grandmother.    ROS:  General:no colds or fevers, no weight changes Skin:no rashes or ulcers HEENT:no blurred vision, no  congestion CV:see HPI PUL:see HPI GI:no diarrhea constipation or melena, no indigestion GU:no hematuria, no dysuria MS:no joint pain, no claudication Neuro:no syncope, + lightheadedness Endo:no diabetes, no thyroid disease  Wt Readings from Last 3 Encounters:  01/19/16 181 lb 12.8 oz (82.5 kg)  01/17/16 178 lb 9.6 oz (81 kg)  12/13/15 177 lb 6.4 oz (80.5 kg)     PHYSICAL EXAM: VS:  BP (!) 174/88   Pulse (!) 58   Ht 6\' 1"  (1.854 m)   Wt 181 lb 12.8 oz (82.5 kg)   BMI 23.99 kg/m  , BMI Body mass index is 23.99 kg/m. General:Pleasant affect, NAD Skin:Warm and dry, brisk capillary refill HEENT:normocephalic, sclera clear, mucus membranes moist Neck:supple, no JVD, no bruits  Heart:S1S2 RRR without murmur, gallup, rub or click Lungs:clear without rales, rhonchi, or wheezes [id number], non tender, + BS, do not palpate liver spleen or masses Ext:no lower ext edema, 2+ pedal pulses, 2+ radial pulses Neuro:alert and oriented X 3, MAE, follows commands, + facial symmetry    EKG:  EKG is NOT ordered today.    Recent Labs: 01/17/2016: ALT 3; BUN 13; Creatinine, Ser 1.00; Hemoglobin 14.5; Platelets 241.0; Potassium 4.5; Sodium 137; TSH 1.49    Lipid Panel    Component Value Date/Time   CHOL 143 01/17/2016 1111   TRIG 89.0 01/17/2016 1111   TRIG 79 01/23/2006 1303   HDL 43.80 01/17/2016 1111   CHOLHDL 3 01/17/2016 1111   VLDL 17.8 01/17/2016 1111   LDLCALC 82 01/17/2016 1111       Other studies Reviewed: Additional studies/ records that were reviewed today include: . ECHO 2015 Study Conclusions  - Left ventricle: The cavity size was normal. Wall thickness was increased in a pattern of mild LVH. There was moderate focal basal hypertrophy of the septum. Systolic function was normal. The estimated ejection fraction was in the range of 60% to 65%. Wall motion was normal; there were no regional wall motion abnormalities. Doppler parameters are consistent with  abnormal left ventricular relaxation (grade 1 diastolic dysfunction). The E/e&' ratio is between 8-15, suggesting indeterminate LV filling pressure. - Aortic valve: Poorly visualized. Sclerosis without stenosis. There was mild regurgitation. - Aorta: Aortic root dimension: 42 mm (ED). - Aortic root: The aortic root is dilated. - Mitral valve: Posterior calcified annulus. There was mild regurgitation. - Atrial septum: Possibly aneurysmal, not well visualzied. No indication of PFO by color doppler. - Tricuspid valve: There was mild regurgitation. - Pulmonary arteries: PA peak pressure: 23 mm Hg (S).  Impressions:  - LVEF 60-65%, mild LVH with moderate focal basal septal hypertrophy, aortic valve  sclerosis, dilated sinotubular junction measuring 4.2 cm, mild central AI, posterior MAC, mild MR.  ASSESSMENT AND PLAN:  1. Orthostatic hypotension, autonomic dysfunction, the patient has developed signs of autonomic dysfunction in the settings of Parkinson disease. This is very common with patient with Parkinson disease, even though he had acute febrile illness possibly Lyme disease however his symptoms started way before he had his tick bite.  discontinued lisinopril, start midodrine 5 mg by mouth 3 times a day and bring him back in 2 weeks for adjustment in needed if this approach fails we will apply for Northera that is very efficient in patients with Parkinson disease.  If his BP is elevated with midodrine.  He will see Megan Supple.   Also recommended support stockings.  2. Acute febrile illness post tick bite,followed by primary care physician  3. Hypertension - mild LVH with moderate focal basal septal hypertrophy on echocardiogram, however currently secondary to Parkinson disease he has developed autonomic dysfunction with profound orthostatic hypotension we will discontinue all his blood pressure meds.   Current medicines are reviewed with the patient today.  The  patient Has no concerns regarding medicines.  The following changes have been made:  See above Labs/ tests ordered today include:see above  Disposition:   FU:  see above  Signed, Cecilie Kicks, NP  01/19/2016 9:47 PM    [facility] [place], [place], [place], [place] Phone: [phone]; Fax: [phone]

## 2016-01-20 ENCOUNTER — Encounter: Payer: Self-pay | Admitting: Medical

## 2016-01-20 ENCOUNTER — Encounter: Payer: Self-pay | Admitting: Family Medicine

## 2016-01-20 NOTE — Telephone Encounter (Addendum)
01/20/16-Called  Patient. States his BP sitting was  118/75 Pulse =81.  Standing BP = 101/67 Pulse =86.  States he went to see Cardiologist yesterday and was started on Midodrine 5 mg 3 times daily with meals. States he started yesterday.

## 2016-01-23 ENCOUNTER — Telehealth: Payer: Self-pay | Admitting: Cardiology

## 2016-01-23 ENCOUNTER — Ambulatory Visit: Payer: PPO | Admitting: Podiatry

## 2016-01-23 NOTE — Telephone Encounter (Signed)
If he doesn't tolerate this, he should follow with Jinny Blossom the next week and we will start Northera.

## 2016-01-23 NOTE — Telephone Encounter (Signed)
William Williamson started taking his new BP medication on Friday and since he has been getting ready high BP readings. ON Friday before taking meds 126/67 Sitting on Friday 96/51 Sat Morning sitting  118/75 Sat afternoon standing101/67 Sunday afternoon 184/91-Feeling a little sick  Sunday evening 165/96 Hasn't taken any pills since Sunday evening scared to take them because of these reading.

## 2016-01-23 NOTE — Telephone Encounter (Signed)
Notified the pt that per Dr Meda Coffee, if he doesn't tolerate this, then when he follows-up with [facility] next week, we can consider starting him on Northera.  Advised the pt to continue logging his BP and bring this into his appt next week.  Advised the pt to maintain good hydration as well. Advised the pt that if he has any acute issues or concerning values, then he can call us back in the office, to inform us of this.  Pt verbalized understanding and agrees with this plan.

## 2016-01-23 NOTE — Telephone Encounter (Signed)
Pt calling with a log of BP readings and concerns with her numbers dropping too low and spiking too high.  Pt states he was ordered to take midodrine 5 mg po TID.  Pt states he started taking his first dose of midodrine on last Friday evening.  Pt states he took midodrine on Saturday morning, skipped his lunch dose of midodrine on Saturday, and then took his evening dose of midodrine on Saturday evening.  Pt reports his BP as mentioned in this note.  Pt reports that his BP is all over the place.  Pt reports he does get symptomatic with dizziness when his pressure drops into the 90s and below.  Pt states he has not taken a dose of midodrine since Saturday night.  Pt has an appt with Megan Supple in BP clinic on next Thursday 11/2, for starting this med.  Pt states Cecilie Kicks NP advised for him to take this med.  Pt would like for Dr Meda Coffee to be aware of this and to advise on what he should do, until his next OV with Megan on 11/2.  Informed the pt that Dr Meda Coffee is out of the office but I will route this message to her for further review and recommendation and follow-up with the pt shortly thereafter.  Pt verbalized understanding and agrees with this plan.

## 2016-02-01 NOTE — Progress Notes (Signed)
Patient ID: William Williamson                 DOB: 12-15-1932                      MRN: [id number]     HPI: William Williamson is a 82 y.o. male referred by Cecilie Kicks, NP to HTN clinic. Patient has developed orthostatic hypotension secondary to autonomic dysfunction in the setting of Parkinson's disease. He has had Parkinson's since age 31 and states that he has been taking Sinemet since then. Sinemet causes orthostatic hypotension in up to 75% of patients. His lisinopril was discontinued and he was started on midodrine 5 mg po three times daily on 10/19. Since then, he has called the clinic with concerns for high spikes in BP and has d/c'd midodrine. He is coming to clinic today for medication assessment/management.  In clinic, patient states he's symptomatic very often when standing. He denies dizziness, but endorses feeling faint regularly while walking in the morning. He states his BP drops and pulse increases. Recently, he measured his BP at 67/51 after standing in front of his house. When sitting throughout the day, his BP stays in normal range. His BP log recordings show a difference in SBP by more than 20 points and DBP by more than 10 points when patient changes position from sitting to standing.  Patient has continued to take midodrine since 10/19, but has experimented with the number of pills he takes per day. He has documented his BP spikes while on the med: SBP 200s on 3 pills daily, SBP 160+ on 2 pills daily, and SBP <150 on 1 pill daily. His last dose of midodrine was yesterday afternoon. He took 2 pills yesterday.  BP today: 120/72 (sitting),HR 64; 85/30 (standing), HR 157  Current HTN meds: midodrine 5mg  1-3 times a day Previously tried: lisinopril, ramipril - hypotension BP goal: <150/13mmHg   Family History:  HTN (sister and 3 brothers), DM (brother), MI (maternal grandfather)  Social History:  Quit smoking 47 years ago (04/02/1968). Denies alcohol and illicit drug use.    Exercise: Walks every morning   Wt Readings from Last 3 Encounters:  01/19/16 181 lb 12.8 oz (82.5 kg)  01/17/16 178 lb 9.6 oz (81 kg)  12/13/15 177 lb 6.4 oz (80.5 kg)   BP Readings from Last 3 Encounters:  01/19/16 (!) 174/88  01/17/16 (!) 160/90  12/13/15 112/70   Pulse Readings from Last 3 Encounters:  01/19/16 (!) 58  01/17/16 100  12/13/15 71    Renal function: Estimated Creatinine Clearance: 66.6 mL/min (by C-G formula based on SCr of 1 mg/dL).  Past Medical History:  Diagnosis Date  . Anemia 1952   post Oromycin for Tularemia  . Anisocoria    post op  . Arthritis   . GERD (gastroesophageal reflux disease)   . Glaucoma    Dr Bing Plume  . Hearing loss in right ear   . Hypercholesteremia   . Hyperthyroidism    s/p RAI  . Parkinson's disease [facility])     Current Outpatient Prescriptions on File Prior to Visit  Medication Sig Dispense Refill  . aspirin 81 MG tablet Take 81 mg by mouth every other day.     . carbidopa-levodopa (SINEMET IR) 25-100 MG tablet Take 1 tablet by mouth 3 (three) times daily. TAKE 2 TABLETS BY MOUTH IN THE MORNING, 1 TABLET BY MOUTH IN THE AFTERNOON, 2 TABLETS BY MOUTH IN THE EVENING    .  Cholecalciferol 2000 UNITS CAPS Take 1 capsule by mouth daily.     . dorzolamide-timolol (COSOPT) 22.3-6.8 MG/ML ophthalmic solution Place 1 drop into both eyes 2 (two) times daily.     . fish oil-omega-3 fatty acids 1000 MG capsule Take 2 g by mouth daily.      Marland Kitchen latanoprost (XALATAN) 0.005 % ophthalmic solution Place 1 drop into both eyes at bedtime.     Marland Kitchen levothyroxine (SYNTHROID, LEVOTHROID) 100 MCG tablet TAKE ONE TABLET BY MOUTH ONCE DAILY 90 tablet 3  . midodrine (PROAMATINE) 5 MG tablet Take 1 tablet (5 mg total) by mouth 3 (three) times daily with meals. 90 tablet 9  . polyethylene glycol (MIRALAX / GLYCOLAX) packet Take 17 g by mouth daily.    . simvastatin (ZOCOR) 20 MG tablet TAKE ONE TABLET BY MOUTH IN THE EVENING 90 tablet 1   No current  facility-administered medications on file prior to visit.     Allergies  Allergen Reactions  . Garlic     nausea  . Niacin     REACTION: flushing     Assessment/Plan:  1. Drug-induced orthostatic hypotension - Patient has S/Sx of orthostatic hypotension due to Sinemet therapy with Parkinson's disease. His SBP decrease by more than 30 points upon standing. He is most symptomatic in the mornings on his daily walks. He has high BP in the upwards range of 200 on Midodrine 5mg  TID. Will reduce the midodrine dose to 2.5-5mg  once a day in the morning before his walks to target isolated symptoms. Recommend staying well-hydrated and wearing compression stockings every day. Follow up in HTN clinic in 2 weeks.  Patient seen with Pharmacy student [place]. Supple, PharmD, [place] [id number] [address], [place], [place] [address] Phone: [phone]; Fax: [phone] 02/02/2016 11:01 AM

## 2016-02-02 ENCOUNTER — Ambulatory Visit (INDEPENDENT_AMBULATORY_CARE_PROVIDER_SITE_OTHER): Payer: PPO | Admitting: Pharmacist

## 2016-02-02 DIAGNOSIS — G909 Disorder of the autonomic nervous system, unspecified: Secondary | ICD-10-CM | POA: Diagnosis not present

## 2016-02-02 NOTE — Patient Instructions (Addendum)
Make sure to stay well-hydrated by drinking plenty of water.  Recommend wearing compression stockings everyday.  Take one-half to one tablet of Midodrine once daily in the morning upon waking before morning walk.  Skip morning dose of Midodrine if blood pressure is higher than 170.  Return to clinic in 2-3 weeks

## 2016-02-16 NOTE — Progress Notes (Signed)
Patient ID: William Williamson                 DOB: March 24, 1933                      MRN: [id number]     HPI: William Williamson is a 82 y.o. male patient of Dr. Meda Coffee returning to HTN clinic for a follow-up visit. PMH of orthostatic hypotension secondary to autonomic dysfunction in the setting of Parkinson's disease and Sinemet therapy. His SBP decreases by more than 30 points upon standing. He is most symptomatic in the mornings on his daily walks. He is on midodrine therapy for orthostatic hypotension but BP spikes to 200 mmHg on Midodrine 5mg  TID. He had been adjusting the number of pills per day to help with symptom control and mitigate BP spikes.  At last pharmacy visit 11/02, decreased the midodrine dose to 2.5-5mg  once a day in the morning before his walks to target isolated symptoms and recommended staying well-hydrated and wearing compression stockings every day.   Patient reports that he has started wearing compression stockings. He has tried using only the compression stocking with no midodrine for 2 days and reports no symptoms of hypotension. He would like to continue only wearing the stockings and use midodrine only if he is symptomatic.   Patient's blood pressure in clinic today was 142/90 mmHg sitting and 126/84 mmHg standing with no reported symptoms of hypotension when standing.   Current meds: midodrine 2.5-5mg  once a day - rarely using Previously tried: lisinopril, ramipril - hypotension BP goal: <150/46mmHg   Family History: HTN (sister and 3 brothers), DM (brother), MI (maternal grandfather)  Social History: Quit smoking 47 years ago (04/02/1968). Denies alcohol and illicit drug use.   Exercise: Walks every morning  Home BP readings:  110-120s/70s sitting, 80-100s/60-70s mmHg standing (with compression stockings, no midodrine).  - 150s/80s mmHg on 1 pill of midodrine - 130s/80s mmHg on 1/2 pill of midodrine  Wt Readings from Last 3 Encounters:  01/19/16 181 lb 12.8 oz  (82.5 kg)  01/17/16 178 lb 9.6 oz (81 kg)  12/13/15 177 lb 6.4 oz (80.5 kg)   BP Readings from Last 3 Encounters:  01/19/16 (!) 174/88  01/17/16 (!) 160/90  12/13/15 112/70   Pulse Readings from Last 3 Encounters:  01/19/16 (!) 58  01/17/16 100  12/13/15 71    Renal function: CrCl cannot be calculated (Patient's most recent lab result is older than the maximum 21 days allowed.).  Past Medical History:  Diagnosis Date  . Anemia 1952   post Oromycin for Tularemia  . Anisocoria    post op  . Arthritis   . GERD (gastroesophageal reflux disease)   . Glaucoma    Dr Bing Plume  . Hearing loss in right ear   . Hypercholesteremia   . Hyperthyroidism    s/p RAI  . Parkinson's disease [facility])     Current Outpatient Prescriptions on File Prior to Visit  Medication Sig Dispense Refill  . aspirin 81 MG tablet Take 81 mg by mouth every other day.     . carbidopa-levodopa (SINEMET IR) 25-100 MG tablet Take 1 tablet by mouth 3 (three) times daily. TAKE 2 TABLETS BY MOUTH IN THE MORNING, 1 TABLET BY MOUTH IN THE AFTERNOON, 2 TABLETS BY MOUTH IN THE EVENING    . Cholecalciferol 2000 UNITS CAPS Take 1 capsule by mouth daily.     . dorzolamide-timolol (COSOPT) 22.3-6.8 MG/ML ophthalmic solution Place  1 drop into both eyes 2 (two) times daily.     . fish oil-omega-3 fatty acids 1000 MG capsule Take 2 g by mouth daily.      Marland Kitchen latanoprost (XALATAN) 0.005 % ophthalmic solution Place 1 drop into both eyes at bedtime.     Marland Kitchen levothyroxine (SYNTHROID, LEVOTHROID) 100 MCG tablet TAKE ONE TABLET BY MOUTH ONCE DAILY 90 tablet 3  . midodrine (PROAMATINE) 5 MG tablet Take 1/2 to 1 tablet daily in the morning    . polyethylene glycol (MIRALAX / GLYCOLAX) packet Take 17 g by mouth daily.    . simvastatin (ZOCOR) 20 MG tablet TAKE ONE TABLET BY MOUTH IN THE EVENING 90 tablet 1   No current facility-administered medications on file prior to visit.     Allergies  Allergen Reactions  . Garlic     nausea  .  Niacin     REACTION: flushing     Assessment/Plan:  1. Hypotension - Orthostatic hypotension secondary to autonomic dysfunction in the setting of Parkinson's disease and Sinemet therapy. Patient is asymptomatic when using compression stockings without midodrine. Advised him to continue using compression stockings and to take midodrine 2.5-5 mg once daily before walks in the morning if needed to target isolated hypotension symptoms. Advised him to call the clinic if he becomes symptomatic and would like to restart scheduled midodrine therapy.    Patient seen by Leroy Libman, [employer]4 pharmacy student.  Katya Rolston E. Rayhan Groleau, PharmD, [place] [id number] [address], [place], [place] [address] Phone: [phone]; Fax: [phone] 02/17/2016 12:06 PM

## 2016-02-17 ENCOUNTER — Ambulatory Visit (INDEPENDENT_AMBULATORY_CARE_PROVIDER_SITE_OTHER): Payer: PPO | Admitting: Pharmacist

## 2016-02-17 VITALS — BP 126/84

## 2016-02-17 DIAGNOSIS — G909 Disorder of the autonomic nervous system, unspecified: Secondary | ICD-10-CM | POA: Diagnosis not present

## 2016-02-17 NOTE — Patient Instructions (Signed)
Continue using compression stockings to help with low blood pressure.   Take Midodrine 2.5-5 mg once a day before walks in the morning if needed.   Call the clinic at [phone] if you have symptoms, have questions, or would like an appointment.

## 2016-02-25 ENCOUNTER — Telehealth: Payer: Self-pay | Admitting: Medical

## 2016-02-25 NOTE — Telephone Encounter (Signed)
Sorry sent this to you late but do agree with referral that was made.

## 2016-03-07 DIAGNOSIS — X32XXXD Exposure to sunlight, subsequent encounter: Secondary | ICD-10-CM | POA: Diagnosis not present

## 2016-03-07 DIAGNOSIS — L570 Actinic keratosis: Secondary | ICD-10-CM | POA: Diagnosis not present

## 2016-03-07 DIAGNOSIS — D225 Melanocytic nevi of trunk: Secondary | ICD-10-CM | POA: Diagnosis not present

## 2016-04-04 ENCOUNTER — Other Ambulatory Visit: Payer: Self-pay | Admitting: Neurology

## 2016-04-04 ENCOUNTER — Other Ambulatory Visit: Payer: Self-pay | Admitting: Family Medicine

## 2016-04-07 ENCOUNTER — Other Ambulatory Visit: Payer: Self-pay | Admitting: Neurology

## 2016-04-07 ENCOUNTER — Other Ambulatory Visit: Payer: Self-pay | Admitting: Family Medicine

## 2016-04-12 NOTE — Progress Notes (Signed)
William Williamson was seen today in the movement disorders clinic for neurologic consultation at the request of [name], DO.  The consultation is for the evaluation of left hand tremor and weakness.  The pt is 83 y.o. male R hand dominant.  He states that he got bit by many ticks in the summer and got headaches.  He then noted L hand tremor some time later (about 1 month ago).  The pt thought that it was lyme disease and he was told that he did not have lyme disease and did not have RMSF.  He notes L hand tremor most when sitting down.  He does not notice it when he is moving.  He has no tremor in the L leg or on the R side   03/31/13 update:  The patient presents today for followup.  He was diagnosed with mild Parkinson's disease on 02/03/2013.  He is currently on no medications.  He does think that tremor is somewhat more prominent but it really doesn't bother him.  When he shaves with the R hand, he may note some tremor on the L.  Otherwise, its more like he can feel it rather than see it.  He had an MRI of the brain since last visit.  There was an absent "swallow to tail sign" which can be seen with PD.  He is exercising.  No falls.  No balance problems.  No syncope.  No n/v, visual distortions or hallucinations.  07/28/13 update:  The patient is seen somewhat earlier than expected.  He does have history of Parkinson's disease.  He has not wanted to be on any medications.  He called recently and said that tremor, which was previously only on the left had spread to the right hand.  The right hand shakes more than the L now.  The L seems "docile" now.    One fall.  Pt was on the bicycle and the dog was on the leash and he fell off the bike.  He walks 3 miles every AM.  In addition, he was concerned because he felt like he had a "sunburn" on his scalp.  He did recently see his primary care physician.  I reviewed those notes.  He was started on Valtrex on April 21 for possible herpetic neuralgia.   He did have shingles vaccine previously.  Is feeling better in that regard.  10/06/13 update:  Pt was started on carbidopa/levodopa 25/100 last visit. He takes it at 6:30/4-5pm/11pm.   He no longer has tremor with ambulation on the R but does still note the tremor with resting the hand in the lap while watching TV.  No falls.  No hallucinations.  No lightheadedness or near syncope.  He is walking 3 miles per day and biking a mile.  He is having some constipation.  04/08/14 update:  The patient is following up today regarding his Parkinson's disease.  He is supposed to be on carbidopa/levodopa 25/100, one tablet 3 times per day but the large majority of the time he only takes it bid and misses the middle of the day dose.  I did review records available to me since last visit.  He saw cardiology on 02/08/2014 for fluctuating blood pressure.  His blood pressure was somewhat elevated in the office and he subsequently had an echocardiogram that demonstrated normal LV ejection fraction with moderate septal hypertrophy.  He was started on amlodipine.  Unfortunatley it caused near syncope and he states that something  else was called in but he hasn't picked it up.  Per records, lisinopril was called in yesterday.  He is also awaiting a nephrology appt for hyperkalemia.  He is having some fatigue.  10/12/14 update:  The patient is seen today back in follow-up, accompanied by his son who supplements the history.  He is on carbidopa/levodopa 25/100, one tablet 3 times per day.  He has trouble remembering the middle of the day dosage.   He has been seeing Dr. Meda Coffee in regards to his high blood pressure.  He had near syncope with amlodipine and was changed to lisinopril.  He is now on 5 mg of lisinopril and doing well and he saw her in May and was doing so well that she recommended a follow-up in one year.  He was treated for an H. pylori infection since our last visit and did see gastroenterology.  Records that were available  to me were reviewed.  His biggest issue is drooling and he wakes up in pools of drool.    01/12/15 update:  The patient is f/u today.  Last visit, I increased his carbidopa/levodopa 25/100 to 2/1/2 but he thought that the medication potentially was making him feel bad so he decreased it back to 1 po tid, although he admits the middle of the day dose 1/2 the time.  He only was up on the increased dosage for a week and then he dropped down on it.  He had trouble describing how he felt "bad" but just stated that he had a "dull headache."  He doesn't describe nausea.  He does think that the higher dose definitely helped the tremor.  No falls since last visit.  He has been exercising daily.  He is still drooling.  He does not wish to pursue myobloc.  He went off lisinopril due to lightheadedness and he feels much better.  Asks me about melanoma risk with levodopa.  05/03/15 update:  The patient follows up today, accompanied by his family who supplements the history.  He is on carbidopa/levodopa 25/100, one tablet 3 times per day (7am/12pm/5:30pm).  Last visit, I felt that he was under dosed, but he did not want to increase the medication because when I did that in the past he thought that that caused headache, although I was not convinced that it was from the medication.  He had one fall since last visit; he was taking a 3 mile walk and about a mile from his home he fell in the ditch and hurt his foot, which is better now.  He has not had hallucinations.  No lightheadedness or near syncope.  He worries about SE of levodopa and worries about it causing glaucoma and melanoma (he read about those things).  Not long after our last visit he saw his orthopedic surgeon at [facility] and he told the patient that he felt that he had a right L5 radiculopathy and he was told to come to me for treatment.  It was recommended to him that he try Neurontin or Lyrica, but he was also told that I prescribed the medication.  I called  the patient to discuss this, as I had no knowledge of his symptoms and he told me he wanted to wait until this visit discussing it, even though this visit was several months from his visit from his orthopedic surgeon.   Pt states that he is no longer having back or radicular sx's.  Does again c/o sialorrhea.  09/30/15 update:  The patient follows up today, accompanied by his wife who supplements the history.  He was on carbidopa/levodopa 25/100, one tablet 3 times per day but increased that to 2/1/2 which is how it was prescribed a long time ago but he did it shortly.  The last few visits, I felt that he was under dosed, but he did not want to increase the medication because when I did that in the past he thought that that caused headache, although I was not convinced that it was from the medication.  He has not had any falls since our last visit.  He has not had hallucinations.  No lightheadedness or near syncope.  "I'm still drooling like crazy."  Still doesn't want to do the botox.    04/16/16 update:  The patient follows up today.    He is on carbidopa/levodopa 25/100, 2 tablets in the morning, one in the afternoon and 2 in the evening.  Pt denies falls. Is having little more tremor.  Noting lip movements.   He is walking 3 miles a day.   Pt denies lightheadedness, near syncope.  No hallucinations.  Mood has been good.  Still noting drooling  PREVIOUS MEDICATIONS: none to date  ALLERGIES:   Allergies  Allergen Reactions  . Garlic     nausea  . Niacin     REACTION: flushing    CURRENT MEDICATIONS:  Current Outpatient Prescriptions on File Prior to Visit  Medication Sig Dispense Refill  . aspirin 81 MG tablet Take 81 mg by mouth every other day.     . carbidopa-levodopa (SINEMET IR) 25-100 MG tablet TAKE 2 TABLETS IN THE MORNING, 1 TABLET IN THE AFTERNOON, AND 2 TABLETS IN THE EVENING 150 tablet 0  . Cholecalciferol 2000 UNITS CAPS Take 1 capsule by mouth daily.     . dorzolamide-timolol  (COSOPT) 22.3-6.8 MG/ML ophthalmic solution Place 1 drop into both eyes 2 (two) times daily.     . fish oil-omega-3 fatty acids 1000 MG capsule Take 2 g by mouth daily.      Marland Kitchen latanoprost (XALATAN) 0.005 % ophthalmic solution Place 1 drop into both eyes at bedtime.     Marland Kitchen levothyroxine (SYNTHROID, LEVOTHROID) 100 MCG tablet TAKE ONE TABLET BY MOUTH ONCE DAILY 90 tablet 3  . polyethylene glycol (MIRALAX / GLYCOLAX) packet Take 17 g by mouth daily.    . simvastatin (ZOCOR) 20 MG tablet TAKE ONE TABLET BY MOUTH IN THE EVENING 90 tablet 0   No current facility-administered medications on file prior to visit.     PAST MEDICAL HISTORY:   Past Medical History:  Diagnosis Date  . Anemia 1952   post Oromycin for [place]  . Anisocoria    post op  . Arthritis   . GERD (gastroesophageal reflux disease)   . Glaucoma    Dr Bing Plume  . Hearing loss in right ear   . Hypercholesteremia   . Hyperthyroidism    s/p RAI  . Parkinson's disease ([place])     PAST SURGICAL HISTORY:   Past Surgical History:  Procedure Laterality Date  . CARDIAC CATHETERIZATION  07/2006   Dr.  Eustace Quail, negative  . CATARACT EXTRACTION, BILATERAL     OS retinal surgery for post op floaters  . COLONOSCOPY     negative X 3  . RAI ablation  02/2008   hyperthyroidism  . SEPTOPLASTY  1970  . VASECTOMY      SOCIAL HISTORY:   Social History   Social History  .  Marital status: Widowed    Spouse name: N/A  . Number of children: N/A  . Years of education: N/A   Occupational History  . Retired     Probation officer   Social History Main Topics  . Smoking status: Former Smoker    Quit date: 04/02/1968  . Smokeless tobacco: Never Used  . Alcohol use No     Comment: 2 beers/day  . Drug use: No  . Sexual activity: Not on file   Other Topics Concern  . 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 Father Deceased at age 53   natural  causes  . Mother Deceased   "old age"  . Brother Deceased   2, train accident; ? [place]  . Brother Alive  . Sister Alive   3, degen arthritis (bedridden at 81 y/o); dementia  . Child Alive   3, alive and well  . Sister   . Brother   . Brother   . Brother   . Maternal Grandmother   . Brother   . Maternal Grandfather   . Neg Hx     ROS:  A complete 10 system review of systems was obtained and was unremarkable apart from what is mentioned above.  PHYSICAL EXAMINATION:    VITALS:   Vitals:   04/16/16 1102  BP: 124/78  Pulse: 64  Weight: 187 lb (84.8 kg)  Height: 6\' 1"  (1.854 m)    GEN:  The patient appears stated age and is in NAD. HEENT:  Normocephalic, atraumatic.  The mucous membranes are moist. The superficial temporal arteries are without ropiness or tenderness. CV:  RRR Lungs:  CTAB Neck/HEME:  There are no carotid bruits bilaterally.  Neurological examination:  Orientation: The patient is alert and oriented x3. Fund of knowledge is appropriate.  Recent and remote memory are intact.  Attention and concentration are normal.    Able to name objects and repeat phrases. Cranial nerves: There is good facial symmetry.  There is facial hypomimia.   The speech is fluent and clear. Soft palate rises symmetrically and there is no tongue deviation. Hearing is decreased to conversational tone. Motor: Strength is 5/5 in the bilateral upper and lower extremities.   Shoulder shrug is equal and symmetric.  There is no pronator drift.  Movement examination: Tone: There is good tone today in the UE Abnormal movements: There is independent RUE/LUE resting tremor, R more than L.  There is movement of the lower lip in and out. Coordination:  There is no decremation with rapid alternating movements today  Gait and Station: The patient has no difficulty arising out of a deep-seated chair without the use of the hands. The patient's stride length is normal but there is increase in bilateral UE  resting tremor and decreased arm swing bilaterally.  He is mildly unsteady  ASSESSMENT/PLAN:  1.  Parkinsons disease, Hoehn and Yahr stage 2.5  - He went back up on the carbidopa/levodopa 25/100 and is on 2/1/2 now.  Less rigidity but still has tremor.  Not that bothersome for the patient and would advise against adding more med for this and he agrees.    -Lip movement may represent minor dyskinesia.  Patient states that it is really not bothersome.  It is not bothersome enough to back down on the levodopa or add further medication.  -discussed exercise in detail; he is walking but  would like to see him add in stationary bike.  He has one at home but isn't using it. 2.  Tinnitus  -I told him that this is not associated with PD and is likely due to hearing loss.  Pt admits that this started when began to have problems with hearing 3.  constipation, associated with Parkinson's disease  -Using generic form of miralax now (clearlax now) 4.  Hypertension  -he had gone off of lisinopril but reports he is only taking 2.5 mg now.  Restarted it when found out that brother had cancer.  He is free of dizziness now.  Wonder if still needs lisinopril but may be using for something other than BP control.   5.  Sialorrhea  -Talked about myobloc but he doesn't wish to pursue myobloc. Discussed again today.  Talked to him again about atropine 0.1% opthalmic but he wants to hold on that for now.  -try lemon drops  6.  Follow up is anticipated in the next 4 months, sooner should new neurologic issues arise.  Much greater than 50% of this visit was spent in counseling with the patient and the family.  Total face to face time:  25 min

## 2016-04-16 ENCOUNTER — Encounter: Payer: Self-pay | Admitting: Neurology

## 2016-04-16 ENCOUNTER — Ambulatory Visit (INDEPENDENT_AMBULATORY_CARE_PROVIDER_SITE_OTHER): Payer: PPO | Admitting: Neurology

## 2016-04-16 ENCOUNTER — Telehealth: Payer: Self-pay | Admitting: Clinical

## 2016-04-16 VITALS — BP 124/78 | HR 64 | Ht 73.0 in | Wt 187.0 lb

## 2016-04-16 DIAGNOSIS — G20 Parkinson's disease: Secondary | ICD-10-CM | POA: Diagnosis not present

## 2016-04-16 DIAGNOSIS — K117 Disturbances of salivary secretion: Secondary | ICD-10-CM

## 2016-04-16 MED ORDER — CARBIDOPA-LEVODOPA 25-100 MG PO TABS: ORAL_TABLET | ORAL | 3 refills | Status: DC

## 2016-04-16 NOTE — Patient Instructions (Signed)
You can use lemon drop candy.  Let me know if the lip movements become bothersome.

## 2016-04-16 NOTE — Telephone Encounter (Signed)
Called pt to notify that [place] mailed, "Managing Parkinson's Mid-Stride" booklet as per Dr. Carles Collet pt requested educational material on PD and motor fluctuations. Pt appreciative.

## 2016-05-29 DIAGNOSIS — H401131 Primary open-angle glaucoma, bilateral, mild stage: Secondary | ICD-10-CM | POA: Diagnosis not present

## 2016-06-18 DIAGNOSIS — H401131 Primary open-angle glaucoma, bilateral, mild stage: Secondary | ICD-10-CM | POA: Diagnosis not present

## 2016-06-20 ENCOUNTER — Other Ambulatory Visit: Payer: Self-pay | Admitting: Family Medicine

## 2016-06-25 ENCOUNTER — Encounter: Payer: Self-pay | Admitting: Family Medicine

## 2016-07-03 DIAGNOSIS — H401131 Primary open-angle glaucoma, bilateral, mild stage: Secondary | ICD-10-CM | POA: Diagnosis not present

## 2016-08-16 ENCOUNTER — Ambulatory Visit (INDEPENDENT_AMBULATORY_CARE_PROVIDER_SITE_OTHER): Payer: PPO | Admitting: Family Medicine

## 2016-08-16 ENCOUNTER — Encounter: Payer: Self-pay | Admitting: Family Medicine

## 2016-08-16 VITALS — BP 138/84 | Temp 97.6°F | Resp 16 | Ht 73.0 in | Wt 180.6 lb

## 2016-08-16 DIAGNOSIS — E039 Hypothyroidism, unspecified: Secondary | ICD-10-CM | POA: Diagnosis not present

## 2016-08-16 DIAGNOSIS — N400 Enlarged prostate without lower urinary tract symptoms: Secondary | ICD-10-CM

## 2016-08-16 DIAGNOSIS — E7800 Pure hypercholesterolemia, unspecified: Secondary | ICD-10-CM

## 2016-08-16 DIAGNOSIS — E785 Hyperlipidemia, unspecified: Secondary | ICD-10-CM

## 2016-08-16 DIAGNOSIS — E890 Postprocedural hypothyroidism: Secondary | ICD-10-CM

## 2016-08-16 DIAGNOSIS — G20 Parkinson's disease: Secondary | ICD-10-CM

## 2016-08-16 DIAGNOSIS — Z0000 Encounter for general adult medical examination without abnormal findings: Secondary | ICD-10-CM | POA: Insufficient documentation

## 2016-08-16 LAB — COMPREHENSIVE METABOLIC PANEL
ALT: 4 U/L (ref 0–53)
AST: 19 U/L (ref 0–37)
Albumin: 4.6 g/dL (ref 3.5–5.2)
Alkaline Phosphatase: 44 U/L (ref 39–117)
BUN: 18 mg/dL (ref 6–23)
CALCIUM: 9.5 mg/dL (ref 8.4–10.5)
CHLORIDE: 102 meq/L (ref 96–112)
CO2: 32 meq/L (ref 19–32)
Creatinine, Ser: 1.04 mg/dL (ref 0.40–1.50)
GFR: 72.78 mL/min (ref >60.00)
GLUCOSE: 91 mg/dL (ref 70–99)
Potassium: 5.4 mEq/L — ABNORMAL HIGH (ref 3.5–5.1)
Sodium: 138 mEq/L (ref 135–145)
Total Bilirubin: 0.8 mg/dL (ref 0.2–1.2)
Total Protein: 7.1 g/dL (ref 6.0–8.3)

## 2016-08-16 LAB — POC URINALSYSI DIPSTICK (AUTOMATED)
BILIRUBIN UA: NEGATIVE
Blood, UA: NEGATIVE
GLUCOSE UA: NEGATIVE
KETONES UA: NEGATIVE
Leukocytes, UA: NEGATIVE
Nitrite, UA: NEGATIVE
PROTEIN UA: NEGATIVE
SPEC GRAV UA: 1.02 (ref 1.010–1.025)
Urobilinogen, UA: NEGATIVE E.U./dL — AB
pH, UA: 6 (ref 5.0–8.0)

## 2016-08-16 LAB — CBC
HEMATOCRIT: 43.3 % (ref 39.0–52.0)
Hemoglobin: 14.7 g/dL (ref 13.0–17.0)
MCHC: 33.9 g/dL (ref 30.0–36.0)
MCV: 94.9 fl (ref 78.0–100.0)
PLATELETS: 214 10*3/uL (ref 150.0–400.0)
RBC: 4.56 Mil/uL (ref 4.22–5.81)
RDW: 13.3 % (ref 11.5–15.5)
WBC: 7.6 10*3/uL (ref 4.0–10.5)

## 2016-08-16 LAB — LIPID PANEL
CHOL/HDL RATIO: 3
Cholesterol: 138 mg/dL (ref 0–200)
HDL: 44.1 mg/dL (ref >39.00)
LDL CALC: 77 mg/dL (ref 0–99)
NONHDL: 93.57
Triglycerides: 85 mg/dL (ref 0.0–149.0)
VLDL: 17 mg/dL (ref 0.0–40.0)

## 2016-08-16 LAB — PSA, MEDICARE: PSA: 4.85 ng/ml — ABNORMAL HIGH (ref 0.10–4.00)

## 2016-08-16 LAB — TSH: TSH: 2.7 u[IU]/mL (ref 0.35–4.50)

## 2016-08-16 NOTE — Assessment & Plan Note (Signed)
Stable con't meds Check labs 

## 2016-08-16 NOTE — Assessment & Plan Note (Signed)
Pt does not want to go to urology anymore and does not want a rectal exam

## 2016-08-16 NOTE — Progress Notes (Signed)
Patient ID: William Williamson, male   DOB: 07-16-1932, 83 y.o.   MRN: [id number]    Subjective:  I acted as a Education administrator for Dr. Carollee Herter.  Guerry Bruin, Colp   Patient ID: William Williamson, male    DOB: 06-29-1932, 83 y.o.   MRN: [id number]  Chief Complaint  Patient presents with  . Medicare Wellness    w/ RN   . Annual Exam    Fasting. Does not wish to continue following w/ Dr. Junious Silk for PSA.  . Fatigue    Walks 2 miles daily and feels 'exhausted' afterwards.    HPI  Patient is in today for annual exam.  He walks about 2 miles daily but he feels exhausted afterwards.    Patient Care Team: Carollee Herter, Alferd Apa, DO as PCP - General (Family Medicine) Allyn Kenner, MD as Consulting Physician (Dermatology) Tat, Eustace Quail, DO as Consulting Physician (Neurology) Festus Aloe, MD as Consulting Physician (Urology) Dorothy Spark, MD as Consulting Physician (Cardiology) Daneen Schick, DDS as Consulting Physician (Dentistry)   Past Medical History:  Diagnosis Date  . Anemia 1952   post Oromycin for [place]  . Anisocoria    post op  . Arthritis   . GERD (gastroesophageal reflux disease)   . Glaucoma    Dr Bing Plume  . Hearing loss in right ear   . Hypercholesteremia   . Hyperthyroidism    s/p RAI  . Parkinson's disease [facility])     Past Surgical History:  Procedure Laterality Date  . CARDIAC CATHETERIZATION  07/2006   Dr.  Eustace Quail, negative  . CATARACT EXTRACTION, BILATERAL     OS retinal surgery for post op floaters  . COLONOSCOPY     negative X 3  . RAI ablation  02/2008   hyperthyroidism  . SEPTOPLASTY  1970  . VASECTOMY      Family History  Problem Relation Age of Onset  . Osteoporosis Mother   . Hypertension Sister   . Hypertension Brother         X 3  . Diabetes Brother   . Prostate cancer Brother   . Stomach cancer Maternal Grandmother   . Pancreatic cancer Brother        ???  . Heart attack Maternal Grandfather 43  . Stroke Neg Hx      Social History   Social History  . Marital status: Widowed    Spouse name: N/A  . Number of children: N/A  . Years of education: N/A   Occupational History  . Retired     Probation officer   Social History Main Topics  . Smoking status: Former Smoker    Quit date: 04/02/1968  . Smokeless tobacco: Never Used  . Alcohol use 3.6 oz/week    6 Cans of beer per week  . Drug use: No  . Sexual activity: Not on file   Other Topics Concern  . 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 aspirin 81 MG tablet Take 81 mg by mouth every other day.     . carbidopa-levodopa (SINEMET IR) 25-100 MG tablet 2 in AM, 1 in afternoon, 2 in PM 450 tablet 3  . Cholecalciferol 2000 UNITS CAPS Take 1 capsule by mouth daily.     . dorzolamide-timolol (COSOPT) 22.3-6.8 MG/ML ophthalmic solution Place 1 drop into both eyes 2 (two) times daily.     Marland Kitchen  fish oil-omega-3 fatty acids 1000 MG capsule Take 2 g by mouth daily.      Marland Kitchen latanoprost (XALATAN) 0.005 % ophthalmic solution Place 1 drop into both eyes at bedtime.     Marland Kitchen levothyroxine (SYNTHROID, LEVOTHROID) 100 MCG tablet TAKE ONE TABLET BY MOUTH ONCE DAILY 90 tablet 3  . polyethylene glycol (MIRALAX / GLYCOLAX) packet Take 17 g by mouth daily.    . simvastatin (ZOCOR) 20 MG tablet TAKE ONE TABLET BY MOUTH IN THE EVENING 90 tablet 1   No facility-administered medications prior to visit.     Allergies  Allergen Reactions  . Garlic Nausea Only  . Niacin     REACTION: flushing    Review of Systems  Constitutional: Positive for malaise/fatigue. Negative for fever.  HENT: Negative for congestion.   Eyes: Negative for blurred vision.  Respiratory: Negative for cough and shortness of breath.   Cardiovascular: Negative for chest pain, palpitations and leg swelling.  Gastrointestinal: Negative for vomiting.  Musculoskeletal: Negative for back pain.  Skin:  Negative for rash.  Neurological: Negative for loss of consciousness and headaches.       Objective:    Physical Exam  Constitutional: He appears well-developed and well-nourished. No distress.  HENT:  Head: Normocephalic and atraumatic.  Eyes: Conjunctivae are normal.  Neck: Normal range of motion. No thyromegaly present.  Cardiovascular: Normal rate and regular rhythm.   Pulmonary/Chest: Effort normal. He has no wheezes.  Abdominal: Soft. Bowel sounds are normal. There is no tenderness.  Musculoskeletal: Normal range of motion. He exhibits no edema or deformity.  Neurological: He is alert.  Skin: Skin is warm and dry. He is not diaphoretic.  Psychiatric: He has a normal mood and affect.    BP 138/84   Temp 97.6 F (36.4 C) (Oral)   Resp 16   Ht 6\' 1"  (1.854 m)   Wt 180 lb 9.6 oz (81.9 kg)   BMI 23.83 kg/m  Wt Readings from Last 3 Encounters:  08/16/16 180 lb 9.6 oz (81.9 kg)  04/16/16 187 lb (84.8 kg)  01/19/16 181 lb 12.8 oz (82.5 kg)   BP Readings from Last 3 Encounters:  08/16/16 138/84  04/16/16 124/78  02/17/16 126/84     Immunization History  Administered Date(s) Administered  . Influenza Split 01/09/2011, 01/15/2012  . Influenza Whole 03/11/2007, 01/09/2008, 01/26/2009, 01/23/2010  . Influenza, High Dose Seasonal PF 01/22/2013, 12/23/2014, 01/17/2016  . Influenza,inj,Quad PF,36+ Mos 12/21/2013  . Pneumococcal Conjugate-13 06/21/2014  . Pneumococcal Polysaccharide-23 06/19/2013  . Td 07/07/2008  . Zoster 06/21/2014    Health Maintenance  Topic Date Due  . INFLUENZA VACCINE  10/31/2016  . TETANUS/TDAP  07/08/2018  . PNA vac Low Risk Adult  Completed    Lab Results  Component Value Date   WBC 8.6 01/17/2016   HGB 14.5 01/17/2016   HCT 43.4 01/17/2016   PLT 241.0 01/17/2016   GLUCOSE 93 01/17/2016   CHOL 143 01/17/2016   TRIG 89.0 01/17/2016   HDL 43.80 01/17/2016   LDLCALC 82 01/17/2016   ALT 3 01/17/2016   AST 17 01/17/2016   NA 137  01/17/2016   K 4.5 01/17/2016   CL 101 01/17/2016   CREATININE 1.00 01/17/2016   BUN 13 01/17/2016   CO2 31 01/17/2016   TSH 1.49 01/17/2016   PSA 4.01 (H) 06/23/2015   INR 0.9 RATIO 07/26/2006   HGBA1C 5.9 10/09/2006    Lab Results  Component Value Date   TSH 1.49 01/17/2016  Lab Results  Component Value Date   WBC 8.6 01/17/2016   HGB 14.5 01/17/2016   HCT 43.4 01/17/2016   MCV 93.3 01/17/2016   PLT 241.0 01/17/2016   Lab Results  Component Value Date   NA 137 01/17/2016   K 4.5 01/17/2016   CO2 31 01/17/2016   GLUCOSE 93 01/17/2016   BUN 13 01/17/2016   CREATININE 1.00 01/17/2016   BILITOT 0.7 01/17/2016   ALKPHOS 49 01/17/2016   AST 17 01/17/2016   ALT 3 01/17/2016   PROT 7.8 01/17/2016   ALBUMIN 4.8 01/17/2016   CALCIUM 9.7 01/17/2016   GFR 76.26 01/17/2016   Lab Results  Component Value Date   CHOL 143 01/17/2016   Lab Results  Component Value Date   HDL 43.80 01/17/2016   Lab Results  Component Value Date   LDLCALC 82 01/17/2016   Lab Results  Component Value Date   TRIG 89.0 01/17/2016   Lab Results  Component Value Date   CHOLHDL 3 01/17/2016                               Lab Results  Component Value Date   HGBA1C 5.9 10/09/2006         Assessment & Plan:   Problem List Items Addressed This Visit      Unprioritized   HYPERCHOLESTEROLEMIA    Tolerating statin, encouraged heart healthy diet, avoid trans fats, minimize simple carbs and saturated fats. Increase exercise as tolerated      Hyperlipidemia LDL goal <100   Relevant Orders   Comprehensive metabolic panel   CBC   Lipid panel   POCT Urinalysis Dipstick (Automated) (Completed)   Hypothyroidism   Relevant Orders   Comprehensive metabolic panel   TSH   Hypothyroidism following radioiodine therapy    Stable con't meds Check labs      Parkinson disease [facility])    Per neuro      Preventative health care    See avs Check labs ghm  utd       Prostate enlargement    Pt does not want to go to urology anymore and does not want a rectal exam      Relevant Orders   PSA, Medicare   POCT Urinalysis Dipstick (Automated) (Completed)    Other Visit Diagnoses    Encounter for Medicare annual wellness exam    -  Primary      I am having Mr. Ager maintain his aspirin, fish oil-omega-3 fatty acids, Cholecalciferol, dorzolamide-timolol, polyethylene glycol, latanoprost, levothyroxine, carbidopa-levodopa, and simvastatin.  No orders of the defined types were placed in this encounter.   CMA served as Education administrator during this visit. History, Physical and Plan performed by medical provider. Documentation and orders reviewed and attested to.  [name], DO

## 2016-08-16 NOTE — Assessment & Plan Note (Signed)
Per neuro 

## 2016-08-16 NOTE — Assessment & Plan Note (Signed)
Tolerating statin, encouraged heart healthy diet, avoid trans fats, minimize simple carbs and saturated fats. Increase exercise as tolerated 

## 2016-08-16 NOTE — Patient Instructions (Addendum)
Mr. William Williamson , Thank you for taking time to come for your Medicare Wellness Visit. I appreciate your ongoing commitment to your health goals. Please review the following plan we discussed and let me know if I can assist you in the future.   Bring a copy of your advance directives to your next office visit.  Eat heart healthy diet (full of fruits, vegetables, whole grains, lean protein, water--limit salt, fat, and sugar intake) and increase physical activity as tolerated.  You can contact your insurance company to determine coverage for the new shingles vaccine (Shringrix).  These are the goals we discussed: Goals    . Maintain current exercise regimen of walking 2 miles daily.       This is a list of the screening recommended for you and due dates:  Health Maintenance  Topic Date Due  . Flu Shot  10/31/2016  . Tetanus Vaccine  07/08/2018  . Pneumonia vaccines  Completed   Health Maintenance, Male A healthy lifestyle and preventive care is important for your health and wellness. Ask your health care provider about what schedule of regular examinations is right for you. What should I know about weight and diet?  Eat a Healthy Diet  Eat plenty of vegetables, fruits, whole grains, low-fat dairy products, and lean protein.  Do not eat a lot of foods high in solid fats, added sugars, or salt. Maintain a Healthy Weight  Regular exercise can help you achieve or maintain a healthy weight. You should:  Do at least 150 minutes of exercise each week. The exercise should increase your heart rate and make you sweat (moderate-intensity exercise).  Do strength-training exercises at least twice a week. Watch Your Levels of Cholesterol and Blood Lipids  Have your blood tested for lipids and cholesterol every 5 years starting at 81 years of age. If you are at high risk for heart disease, you should start having your blood tested when you are 81 years old. You may need to have your cholesterol  levels checked more often if:  Your lipid or cholesterol levels are high.  You are older than 81 years of age.  You are at high risk for heart disease. What should I know about cancer screening? Many types of cancers can be detected early and may often be prevented. Lung Cancer  You should be screened every year for lung cancer if:  You are a current smoker who has smoked for at least 30 years.  You are a former smoker who has quit within the past 15 years.  Talk to your health care provider about your screening options, when you should start screening, and how often you should be screened. Colorectal Cancer  Routine colorectal cancer screening usually begins at 81 years of age and should be repeated every 5-10 years until you are 81 years old. You may need to be screened more often if early forms of precancerous polyps or small growths are found. Your health care provider may recommend screening at an earlier age if you have risk factors for colon cancer.  Your health care provider may recommend using home test kits to check for hidden blood in the stool.  A small camera at the end of a tube can be used to examine your colon (sigmoidoscopy or colonoscopy). This checks for the earliest forms of colorectal cancer. Prostate and Testicular Cancer  Depending on your age and overall health, your health care provider may do certain tests to screen for prostate and testicular cancer.  Talk to your health care provider about any symptoms or concerns you have about testicular or prostate cancer. Skin Cancer  Check your skin from head to toe regularly.  Tell your health care provider about any new moles or changes in moles, especially if:  There is a change in a mole's size, shape, or color.  You have a mole that is larger than a pencil eraser.  Always use sunscreen. Apply sunscreen liberally and repeat throughout the day.  Protect yourself by wearing long sleeves, pants, a  wide-brimmed hat, and sunglasses when outside. What should I know about heart disease, diabetes, and high blood pressure?  If you are 56-76 years of age, have your blood pressure checked every 3-5 years. If you are 51 years of age or older, have your blood pressure checked every year. You should have your blood pressure measured twice-once when you are at a hospital or clinic, and once when you are not at a hospital or clinic. Record the average of the two measurements. To check your blood pressure when you are not at a hospital or clinic, you can use:  An automated blood pressure machine at a pharmacy.  A home blood pressure monitor.  Talk to your health care provider about your target blood pressure.  If you are between 52-64 years old, ask your health care provider if you should take aspirin to prevent heart disease.  Have regular diabetes screenings by checking your fasting blood sugar level.  If you are at a normal weight and have a low risk for diabetes, have this test once every three years after the age of 59.  If you are overweight and have a high risk for diabetes, consider being tested at a younger age or more often.  A one-time screening for abdominal aortic aneurysm (AAA) by ultrasound is recommended for men aged 28-75 years who are current or former smokers. What should I know about preventing infection? Hepatitis B  If you have a higher risk for hepatitis B, you should be screened for this virus. Talk with your health care provider to find out if you are at risk for hepatitis B infection. Hepatitis C  Blood testing is recommended for:  Everyone born from [birth year] through 1963.  Anyone with known risk factors for hepatitis C. Sexually Transmitted Diseases (STDs)  You should be screened each year for STDs including gonorrhea and chlamydia if:  You are sexually active and are younger than 81 years of age.  You are older than 81 years of age and your health care provider  tells you that you are at risk for this type of infection.  Your sexual activity has changed since you were last screened and you are at an increased risk for chlamydia or gonorrhea. Ask your health care provider if you are at risk.  Talk with your health care provider about whether you are at high risk of being infected with HIV. Your health care provider may recommend a prescription medicine to help prevent HIV infection. What else can I do?  Schedule regular health, dental, and eye exams.  Stay current with your vaccines (immunizations).  Do not use any tobacco products, such as cigarettes, chewing tobacco, and e-cigarettes. If you need help quitting, ask your health care provider.  Limit alcohol intake to no more than 2 drinks per day. One drink equals 12 ounces of beer, 5 ounces of wine, or 1 ounces of hard liquor.  Do not use street drugs.  Do not share  needles.  Ask your health care provider for help if you need support or information about quitting drugs.  Tell your health care provider if you often feel depressed.  Tell your health care provider if you have ever been abused or do not feel safe at home. This information is not intended to replace advice given to you by your health care provider. Make sure you discuss any questions you have with your health care provider. Document Released: 09/15/2007 Document Revised: 11/16/2015 Document Reviewed: 12/21/2014 Elsevier Interactive Patient Education  2017 [employer].

## 2016-08-16 NOTE — Progress Notes (Signed)
Subjective:   William Williamson is a 83 y.o. male who presents for Medicare Annual/Subsequent preventive examination.  Review of Systems:  No ROS.  Medicare Wellness Visit.  Cardiac Risk Factors include: advanced age (>4men, >65 women);dyslipidemia;male gender  Sleep patterns: no sleep issues and gets up 2 times nightly to void.   Home Safety/Smoke Alarms: Feels safe in home. Smoke alarms in place.   Living environment; residence and Firearm Safety: Lives w/ son and 2 dogs. Lives in ranch home w/ basement.  1-story house/ trailer. Safe firearm storage discussed. Seat Belt Safety/Bike Helmet: Wears seat belt.   Counseling:   Eye Exam- Wearing glasses. Follows w/ eye doctor regularly, cannot remember name of provider. Dental- Follows w/ Dr. Kalman Shan in [place] yearly.   Male:   CCS- Aged out     PSA- Followed w/ Dr. Junious Silk, [facility]. Pt elected to continue surveillance of PSA at 09/19/15 OV. Today pt states that he does not wish to continue following w/ Dr. Junious Silk because he feels that at his age he would not treat prostate CA regardless. Lab Results  Component Value Date   PSA 4.01 (H) 06/23/2015   PSA 5.11 (H) 06/19/2013   PSA 3.60 06/02/2009       Objective:    Vitals: BP 138/84   Temp 97.6 F (36.4 C) (Oral)   Resp 16   Ht 6\' 1"  (1.854 m)   Wt 180 lb 9.6 oz (81.9 kg)   BMI 23.83 kg/m   Body mass index is 23.83 kg/m.  BP Readings from Last 3 Encounters:  08/16/16 138/84  04/16/16 124/78  02/17/16 126/84    Tobacco History  Smoking Status  . Former Smoker  . Quit date: 04/02/1968  Smokeless Tobacco  . Never Used     Counseling given: Not Answered   Past Medical History:  Diagnosis Date  . Anemia 1952   post Oromycin for [place]  . Anisocoria    post op  . Arthritis   . GERD (gastroesophageal reflux disease)   . Glaucoma    Dr Bing Plume  . Hearing loss in right ear   . Hypercholesteremia   . Hyperthyroidism    s/p RAI  . Parkinson's  disease [facility])    Past Surgical History:  Procedure Laterality Date  . CARDIAC CATHETERIZATION  07/2006   Dr.  Eustace Quail, negative  . CATARACT EXTRACTION, BILATERAL     OS retinal surgery for post op floaters  . COLONOSCOPY     negative X 3  . RAI ablation  02/2008   hyperthyroidism  . SEPTOPLASTY  1970  . VASECTOMY     Family History  Problem Relation Age of Onset  . Osteoporosis Mother   . Hypertension Sister   . Hypertension Brother         X 3  . Diabetes Brother   . Prostate cancer Brother   . Stomach cancer Maternal Grandmother   . Pancreatic cancer Brother        ???  . Heart attack Maternal Grandfather 50  . Stroke Neg Hx    History  Sexual Activity  . Sexual activity: Not on file    Outpatient Encounter Prescriptions as of 08/16/2016  Medication Sig  . aspirin 81 MG tablet Take 81 mg by mouth every other day.   . carbidopa-levodopa (SINEMET IR) 25-100 MG tablet 2 in AM, 1 in afternoon, 2 in PM  . Cholecalciferol 2000 UNITS CAPS Take 1 capsule by mouth daily.   Marland Kitchen  dorzolamide-timolol (COSOPT) 22.3-6.8 MG/ML ophthalmic solution Place 1 drop into both eyes 2 (two) times daily.   . fish oil-omega-3 fatty acids 1000 MG capsule Take 2 g by mouth daily.    Marland Kitchen latanoprost (XALATAN) 0.005 % ophthalmic solution Place 1 drop into both eyes at bedtime.   Marland Kitchen levothyroxine (SYNTHROID, LEVOTHROID) 100 MCG tablet TAKE ONE TABLET BY MOUTH ONCE DAILY  . polyethylene glycol (MIRALAX / GLYCOLAX) packet Take 17 g by mouth daily.  . simvastatin (ZOCOR) 20 MG tablet TAKE ONE TABLET BY MOUTH IN THE EVENING   No facility-administered encounter medications on file as of 08/16/2016.     Activities of Daily Living In your present state of health, do you have any difficulty performing the following activities: 08/16/2016 01/17/2016  Hearing? Tempie Donning  Vision? N Y  Difficulty concentrating or making decisions? N N  Walking or climbing stairs? N Y  Dressing or bathing? N N  Doing errands,  shopping? N Y  Conservation officer, nature and eating ? N -  Using the Toilet? N -  In the past six months, have you accidently leaked urine? Y -  Do you have problems with loss of bowel control? N -  Managing your Medications? N -  Managing your Finances? N -  Housekeeping or managing your Housekeeping? N -  Some recent data might be hidden    Patient Care Team: Carollee Herter, Alferd Apa, DO as PCP - General (Family Medicine) Allyn Kenner, MD as Consulting Physician (Dermatology) Tat, Eustace Quail, DO as Consulting Physician (Neurology) Festus Aloe, MD as Consulting Physician (Urology) Dorothy Spark, MD as Consulting Physician (Cardiology) Daneen Schick, DDS as Consulting Physician (Dentistry)   Assessment:    Physical assessment deferred to PCP.  Exercise Activities and Dietary recommendations Current Exercise Habits: Home exercise routine, Type of exercise: walking (2 miles daily), Frequency (Times/Week): 7. Also stays active working in the yard.  Diet (meal preparation, eat out, water intake, caffeinated beverages, dairy products, fruits and vegetables): in general, a "healthy" diet  , well balanced, on average, 3 meals per day. Cooks 1 meal daily for dinner and eats light breakfast and lunch. Estimates he drinks about 60 oz of water daily. Breakfast: Raisin Bran Lunch: Sandwich Dinner: Varies- Meatballs, pasta, pasta salad; salad w/ chicken; tuna patties, slaw patties  Goals    . Maintain current exercise regimen of walking 2 miles daily.      Fall Risk Fall Risk  08/16/2016 04/16/2016 01/17/2016 09/30/2015 06/23/2015  Falls in the past year? No No No No Yes  Number falls in past yr: - - - - 1  Injury with Fall? - - - - No  Risk for fall due to : - - - - Impaired balance/gait;Medication side effect  Follow up - - - - Education provided   Depression Screen PHQ 2/9 Scores 08/16/2016 01/17/2016 06/23/2015 06/23/2015  PHQ - 2 Score 0 0 0 0  Exception Documentation - Patient refusal - -     Cognitive Function MMSE - Mini Mental State Exam 08/16/2016  Orientation to time 5  Orientation to Place 5  Registration 3  Attention/ Calculation 5  Recall 3  Language- name 2 objects 2  Language- repeat 1  Language- follow 3 step command 3  Language- read & follow direction 1  Write a sentence 1  Copy design 1  Total score 30        Immunization History  Administered Date(s) Administered  . Influenza Split 01/09/2011, 01/15/2012  .  Influenza Whole 03/11/2007, 01/09/2008, 01/26/2009, 01/23/2010  . Influenza, High Dose Seasonal PF 01/22/2013, 12/23/2014, 01/17/2016  . Influenza,inj,Quad PF,36+ Mos 12/21/2013  . Pneumococcal Conjugate-13 06/21/2014  . Pneumococcal Polysaccharide-23 06/19/2013  . Td 07/07/2008  . Zoster 06/21/2014   Screening Tests Health Maintenance  Topic Date Due  . INFLUENZA VACCINE  10/31/2016  . TETANUS/TDAP  07/08/2018  . PNA vac Low Risk Adult  Completed      Plan:   Follow-up w/ PCP as directed.  Bring a copy of your advance directives to your next office visit.  Eat heart healthy diet (full of fruits, vegetables, whole grains, lean protein, water--limit salt, fat, and sugar intake) and increase physical activity as tolerated.  I have personally reviewed and noted the following in the patient's chart:   . Medical and social history . Use of alcohol, tobacco or illicit drugs  . Current medications and supplements . Functional ability and status . Nutritional status . Physical activity . Advanced directives . List of other physicians . Vitals . Screenings to include cognitive, depression, and falls . Referrals and appointments  In addition, I have reviewed and discussed with patient certain preventive protocols, quality metrics, and best practice recommendations. A written personalized care plan for preventive services as well as general preventive health recommendations were provided to patient.     Dorrene German,  RN  08/16/2016

## 2016-08-16 NOTE — Assessment & Plan Note (Signed)
See avs Check labs ghm utd 

## 2016-08-30 NOTE — Progress Notes (Signed)
William Williamson was seen today in the movement disorders clinic for neurologic consultation at the request of [name], DO.  The consultation is for the evaluation of left hand tremor and weakness.  The pt is 83 y.o. male R hand dominant.  He states that he got bit by many ticks in the summer and got headaches.  He then noted L hand tremor some time later (about 1 month ago).  The pt thought that it was lyme disease and he was told that he did not have lyme disease and did not have RMSF.  He notes L hand tremor most when sitting down.  He does not notice it when he is moving.  He has no tremor in the L leg or on the R side   03/31/13 update:  The patient presents today for followup.  He was diagnosed with mild Parkinson's disease on 02/03/2013.  He is currently on no medications.  He does think that tremor is somewhat more prominent but it really doesn't bother him.  When he shaves with the R hand, he may note some tremor on the L.  Otherwise, its more like he can feel it rather than see it.  He had an MRI of the brain since last visit.  There was an absent "swallow to tail sign" which can be seen with PD.  He is exercising.  No falls.  No balance problems.  No syncope.  No n/v, visual distortions or hallucinations.  07/28/13 update:  The patient is seen somewhat earlier than expected.  He does have history of Parkinson's disease.  He has not wanted to be on any medications.  He called recently and said that tremor, which was previously only on the left had spread to the right hand.  The right hand shakes more than the L now.  The L seems "docile" now.    One fall.  Pt was on the bicycle and the dog was on the leash and he fell off the bike.  He walks 3 miles every AM.  In addition, he was concerned because he felt like he had a "sunburn" on his scalp.  He did recently see his primary care physician.  I reviewed those notes.  He was started on Valtrex on April 21 for possible herpetic neuralgia.   He did have shingles vaccine previously.  Is feeling better in that regard.  10/06/13 update:  Pt was started on carbidopa/levodopa 25/100 last visit. He takes it at 6:30/4-5pm/11pm.   He no longer has tremor with ambulation on the R but does still note the tremor with resting the hand in the lap while watching TV.  No falls.  No hallucinations.  No lightheadedness or near syncope.  He is walking 3 miles per day and biking a mile.  He is having some constipation.  04/08/14 update:  The patient is following up today regarding his Parkinson's disease.  He is supposed to be on carbidopa/levodopa 25/100, one tablet 3 times per day but the large majority of the time he only takes it bid and misses the middle of the day dose.  I did review records available to me since last visit.  He saw cardiology on 02/08/2014 for fluctuating blood pressure.  His blood pressure was somewhat elevated in the office and he subsequently had an echocardiogram that demonstrated normal LV ejection fraction with moderate septal hypertrophy.  He was started on amlodipine.  Unfortunatley it caused near syncope and he states that something  else was called in but he hasn't picked it up.  Per records, lisinopril was called in yesterday.  He is also awaiting a nephrology appt for hyperkalemia.  He is having some fatigue.  10/12/14 update:  The patient is seen today back in follow-up, accompanied by his son who supplements the history.  He is on carbidopa/levodopa 25/100, one tablet 3 times per day.  He has trouble remembering the middle of the day dosage.   He has been seeing Dr. Meda Coffee in regards to his high blood pressure.  He had near syncope with amlodipine and was changed to lisinopril.  He is now on 5 mg of lisinopril and doing well and he saw her in May and was doing so well that she recommended a follow-up in one year.  He was treated for an H. pylori infection since our last visit and did see gastroenterology.  Records that were available  to me were reviewed.  His biggest issue is drooling and he wakes up in pools of drool.    01/12/15 update:  The patient is f/u today.  Last visit, I increased his carbidopa/levodopa 25/100 to 2/1/2 but he thought that the medication potentially was making him feel bad so he decreased it back to 1 po tid, although he admits the middle of the day dose 1/2 the time.  He only was up on the increased dosage for a week and then he dropped down on it.  He had trouble describing how he felt "bad" but just stated that he had a "dull headache."  He doesn't describe nausea.  He does think that the higher dose definitely helped the tremor.  No falls since last visit.  He has been exercising daily.  He is still drooling.  He does not wish to pursue myobloc.  He went off lisinopril due to lightheadedness and he feels much better.  Asks me about melanoma risk with levodopa.  05/03/15 update:  The patient follows up today, accompanied by his family who supplements the history.  He is on carbidopa/levodopa 25/100, one tablet 3 times per day (7am/12pm/5:30pm).  Last visit, I felt that he was under dosed, but he did not want to increase the medication because when I did that in the past he thought that that caused headache, although I was not convinced that it was from the medication.  He had one fall since last visit; he was taking a 3 mile walk and about a mile from his home he fell in the ditch and hurt his foot, which is better now.  He has not had hallucinations.  No lightheadedness or near syncope.  He worries about SE of levodopa and worries about it causing glaucoma and melanoma (he read about those things).  Not long after our last visit he saw his orthopedic surgeon at [facility] and he told the patient that he felt that he had a right L5 radiculopathy and he was told to come to me for treatment.  It was recommended to him that he try Neurontin or Lyrica, but he was also told that I prescribed the medication.  I called  the patient to discuss this, as I had no knowledge of his symptoms and he told me he wanted to wait until this visit discussing it, even though this visit was several months from his visit from his orthopedic surgeon.   Pt states that he is no longer having back or radicular sx's.  Does again c/o sialorrhea.  09/30/15 update:  The patient follows up today, accompanied by his wife who supplements the history.  He was on carbidopa/levodopa 25/100, one tablet 3 times per day but increased that to 2/1/2 which is how it was prescribed a long time ago but he did it shortly.  The last few visits, I felt that he was under dosed, but he did not want to increase the medication because when I did that in the past he thought that that caused headache, although I was not convinced that it was from the medication.  He has not had any falls since our last visit.  He has not had hallucinations.  No lightheadedness or near syncope.  "I'm still drooling like crazy."  Still doesn't want to do the botox.    04/16/16 update:  The patient follows up today.    He is on carbidopa/levodopa 25/100, 2 tablets in the morning, one in the afternoon and 2 in the evening.  Pt denies falls. Is having little more tremor.  Noting lip movements.   He is walking 3 miles a day.   Pt denies lightheadedness, near syncope.  No hallucinations.  Mood has been good.  Still noting drooling  09/11/16 update:  Patient seen today in follow-up.  He is on carbidopa/levodopa 25/100, 2 tablets in the morning, one in the afternoon and 2 in the evening.  Pt denies falls.  Has to sit to put on pants now.   Pt has some lightheadedness, but no syncope.  He is drinking 6-8 glasses water per day.  Noting low blood pressure at home.   No hallucinations.  Mood has been good.  States that his stamina is decreasing.  Thinks it is related to low blood pressure.  Had ingrown toenail removed yesterday.  PREVIOUS MEDICATIONS: none to date  ALLERGIES:   Allergies  Allergen  Reactions  . Garlic Nausea Only  . Niacin     REACTION: flushing    CURRENT MEDICATIONS:  Current Outpatient Prescriptions on File Prior to Visit  Medication Sig Dispense Refill  . aspirin 81 MG tablet Take 81 mg by mouth every other day.     . carbidopa-levodopa (SINEMET IR) 25-100 MG tablet 2 in AM, 1 in afternoon, 2 in PM 450 tablet 3  . Cholecalciferol 2000 UNITS CAPS Take 1 capsule by mouth daily.     . dorzolamide-timolol (COSOPT) 22.3-6.8 MG/ML ophthalmic solution Place 1 drop into both eyes 2 (two) times daily.     . fish oil-omega-3 fatty acids 1000 MG capsule Take 2 g by mouth daily.      Marland Kitchen latanoprost (XALATAN) 0.005 % ophthalmic solution Place 1 drop into both eyes at bedtime.     Marland Kitchen levothyroxine (SYNTHROID, LEVOTHROID) 100 MCG tablet TAKE ONE TABLET BY MOUTH ONCE DAILY 90 tablet 1  . polyethylene glycol (MIRALAX / GLYCOLAX) packet Take 17 g by mouth daily.    . simvastatin (ZOCOR) 20 MG tablet TAKE ONE TABLET BY MOUTH IN THE EVENING 90 tablet 1   No current facility-administered medications on file prior to visit.     PAST MEDICAL HISTORY:   Past Medical History:  Diagnosis Date  . Anemia 1952   post Oromycin for [place]  . Anisocoria    post op  . Arthritis   . GERD (gastroesophageal reflux disease)   . Glaucoma    Dr Bing Plume  . Hearing loss in right ear   . Hypercholesteremia   . Hyperthyroidism    s/p RAI  . Parkinson's disease ([place])  PAST SURGICAL HISTORY:   Past Surgical History:  Procedure Laterality Date  . CARDIAC CATHETERIZATION  07/2006   Dr.  Eustace Quail, negative  . CATARACT EXTRACTION, BILATERAL     OS retinal surgery for post op floaters  . COLONOSCOPY     negative X 3  . RAI ablation  02/2008   hyperthyroidism  . SEPTOPLASTY  1970  . VASECTOMY      SOCIAL HISTORY:   Social History   Social History  . Marital status: Widowed    Spouse name: N/A  . Number of children: N/A  . Years of education: N/A   Occupational History  .  Retired     Probation officer   Social History Main Topics  . Smoking status: Former Smoker    Quit date: 04/02/1968  . Smokeless tobacco: Never Used  . Alcohol use 3.6 oz/week    6 Cans of beer per week  . Drug use: No  . Sexual activity: Not on file   Other Topics Concern  . 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 Father Deceased at age 68       natural causes  . Mother Deceased       "old age"  . Brother Deceased       2, train accident; ? [place]  . Brother Alive  . Sister Alive       3, degen arthritis (bedridden at 81 y/o); dementia  . Child Alive       3, alive and well  . Sister (Not Specified)  . Brother (Not Specified)  . Brother (Not Specified)  . Brother (Not Specified)  . MGM (Not Specified)  . Brother (Not Specified)  . MGF (Not Specified)  . Neg Hx (Not Specified)    ROS:  A complete 10 system review of systems was obtained and was unremarkable apart from what is mentioned above.  PHYSICAL EXAMINATION:    VITALS:   Vitals:   09/11/16 1016  BP: (!) 148/72  Pulse: 60  SpO2: 94%  Weight: 180 lb (81.6 kg)  Height: 6\' 1"  (1.854 m)    GEN:  The patient appears stated age and is in NAD. HEENT:  Normocephalic, atraumatic.  The mucous membranes are moist. The superficial temporal arteries are without ropiness or tenderness. CV:  RRR Lungs:  CTAB Neck/HEME:  There are no carotid bruits bilaterally.  Neurological examination:  Orientation: The patient is alert and oriented x3. Fund of knowledge is appropriate.  Recent and remote memory are intact.  Attention and concentration are normal.    Able to name objects and repeat phrases. Cranial nerves: There is good facial symmetry.  There is facial hypomimia.   The speech is fluent and clear. Soft palate rises symmetrically and there is no tongue deviation. Hearing is decreased to conversational tone. Motor: Strength is 5/5 in the  bilateral upper and lower extremities.   Shoulder shrug is equal and symmetric.  There is no pronator drift.  Movement examination: Tone: There is good tone today in the UE Abnormal movements: There is independent RUE/LUE resting tremor, R more than L.  There is movement of the lower lip in and out. Coordination:  There is no decremation with rapid alternating movements today  Gait and Station: The patient has no difficulty arising out of a deep-seated chair without the use of the hands. The patient's stride  length is normal but there is increase in bilateral UE resting tremor and decreased arm swing bilaterally.  He is mildly unsteady  ASSESSMENT/PLAN:  1.  Parkinsons disease, Hoehn and Yahr stage 2.5  - He went back up on the carbidopa/levodopa 25/100 and is on 2/1/2 now.  Less rigidity but still has tremor.  Not that bothersome for the patient and would advise against adding more med for this and he agrees.    -Lip movement may represent minor dyskinesia.  Patient states that it is really not bothersome.  It is not bothersome enough to back down on the levodopa or add further medication.  -would like to send him to PT but he wants to hold.  Think that rock steady boxing would be great for him and would help his c/o decreased stamina but he is resistant.  Gave him information on that today  2.  constipation, associated with Parkinson's disease  -Using generic form of miralax now (clearlax now) 3.  Hypertension  -he is back off of lisinopril.  Reports BP at home is running low and having dizziness.  Given RX for abdominal RX binder.    4.  Sialorrhea  -Talked about myobloc but he doesn't wish to pursue myobloc. Discussed again today.  Talked to him again about atropine 0.1% opthalmic but he wants to hold on that for now.  -try lemon drops  5.  Follow up is anticipated in the next 4 months, sooner should new neurologic issues arise.  Much greater than 50% of this visit was spent in counseling  with the patient and the family.  Total face to face time:  25 min

## 2016-09-05 ENCOUNTER — Other Ambulatory Visit: Payer: Self-pay | Admitting: Family Medicine

## 2016-09-10 ENCOUNTER — Encounter: Payer: Self-pay | Admitting: Podiatry

## 2016-09-10 ENCOUNTER — Ambulatory Visit (INDEPENDENT_AMBULATORY_CARE_PROVIDER_SITE_OTHER): Payer: PPO

## 2016-09-10 ENCOUNTER — Ambulatory Visit (INDEPENDENT_AMBULATORY_CARE_PROVIDER_SITE_OTHER): Payer: PPO | Admitting: Podiatry

## 2016-09-10 DIAGNOSIS — L609 Nail disorder, unspecified: Secondary | ICD-10-CM | POA: Diagnosis not present

## 2016-09-10 DIAGNOSIS — L600 Ingrowing nail: Secondary | ICD-10-CM

## 2016-09-10 DIAGNOSIS — B351 Tinea unguium: Secondary | ICD-10-CM

## 2016-09-10 DIAGNOSIS — R52 Pain, unspecified: Secondary | ICD-10-CM | POA: Diagnosis not present

## 2016-09-11 ENCOUNTER — Ambulatory Visit (INDEPENDENT_AMBULATORY_CARE_PROVIDER_SITE_OTHER): Payer: PPO | Admitting: Neurology

## 2016-09-11 ENCOUNTER — Encounter: Payer: Self-pay | Admitting: Neurology

## 2016-09-11 VITALS — BP 148/72 | HR 60 | Ht 73.0 in | Wt 180.0 lb

## 2016-09-11 DIAGNOSIS — G20 Parkinson's disease: Secondary | ICD-10-CM

## 2016-09-11 DIAGNOSIS — R42 Dizziness and giddiness: Secondary | ICD-10-CM

## 2016-09-11 MED ORDER — ABDOMINAL BINDER/ELASTIC MED MISC: 0 refills | Status: DC

## 2016-09-12 NOTE — Progress Notes (Signed)
Subjective: 83 year old male presents the also concerns of ingrown toenail to the left medial nail border which is been ongoing the last couple weeks. He states the area doesn't bleed. Denies any swelling or redness or any pus. No recent treatments his last saw him for this area. Denies any systemic complaints such as fevers, chills, nausea, vomiting. No acute changes since last appointment, and no other complaints at this time.   Objective: AAO x3, NAD DP/PT pulses palpable bilaterally, CRT less than 3 seconds Incurvation along the medial aspect the left hallux toenail tenderness to palpation. There is localized edema but there is no erythema or drainage or pus expressed. There is no ascending synovitis. Previous lateral partial nail avulsion performed. No other areas of tenderness.No open lesions or pre-ulcerative lesions.  No pain with calf compression, swelling, warmth, erythema  Assessment: Left medial hallux and medical ingrown toenail  Plan: -All treatment options discussed with the patient including all alternatives, risks, complications.  -At this time, the patient is requesting partial nail removal with chemical matricectomy to the symptomatic portion of the nail. Risks and complications were discussed with the patient for which they understand and  verbally consent to the procedure. Under sterile conditions a total of 3 mL of a mixture of 2% lidocaine plain and 0.5% Marcaine plain was infiltrated in a hallux block fashion. Once anesthetized, the skin was prepped in sterile fashion. A tourniquet was then applied. Next the medial aspect of hallux nail border was then sharply excised making sure to remove the entire offending nail border. Once the nails were ensured to be removed area was debrided and the underlying skin was intact. There is no purulence identified in the procedure. Next phenol was then applied under standard conditions and copiously irrigated. Silvadene was applied. A dry  sterile dressing was applied. After application of the dressing the tourniquet was removed and there is found to be an immediate capillary refill time to the digit. The patient tolerated the procedure well any complications. Post procedure instructions were discussed the patient for which he verbally understood. Follow-up in one week for nail check or sooner if any problems are to arise. Discussed signs/symptoms of infection and directed to call the office immediately should any occur or go directly to the emergency room. In the meantime, encouraged to call the office with any questions, concerns, changes symptoms. -Patient encouraged to call the office with any questions, concerns, change in symptoms.   Celesta Gentile, DPM

## 2016-09-24 ENCOUNTER — Ambulatory Visit (INDEPENDENT_AMBULATORY_CARE_PROVIDER_SITE_OTHER): Payer: PPO | Admitting: Podiatry

## 2016-09-24 ENCOUNTER — Encounter: Payer: Self-pay | Admitting: Podiatry

## 2016-09-24 DIAGNOSIS — L600 Ingrowing nail: Secondary | ICD-10-CM

## 2016-09-24 DIAGNOSIS — Z9889 Other specified postprocedural states: Secondary | ICD-10-CM

## 2016-09-25 NOTE — Progress Notes (Signed)
Subjective: William Williamson is a 83 y.o.  male returns to office today for follow up evaluation after having left Hallux medial and lateral nail avulsion performed. Patient has been soaking using epsom sals and applying topical antibiotic covered with bandaid daily. He states that is doing "good". Denies any drainage or pus or any red streaks. Patient denies fevers, chills, nausea, vomiting. Denies any calf pain, chest pain, SOB.   Objective:  Vitals: Reviewed  General: Well developed, nourished, in no acute distress, alert and oriented x3   Dermatology: Skin is warm, dry and supple bilateral. Left hallux nail border appears to be clean, dry, with mild granular tissue and surrounding scab. There is no surrounding erythema, edema, drainage/purulence. The remaining nails appear unremarkable at this time. There are no other lesions or other signs of infection present.  Neurovascular status: Intact. No lower extremity swelling; No pain with calf compression bilateral.  Musculoskeletal: Decreased tenderness to palpation of the medial hallux nail fold. Muscular strength within normal limits bilateral.   Assesement and Plan: S/p partial nail avulsion, doing well.   -Continue soaking in epsom salts twice a day followed by antibiotic ointment and a band-aid. Can leave uncovered at night. Continue this until completely healed.  -If the area has not healed in 2 weeks, call the office for follow-up appointment, or sooner if any problems arise.  -Monitor for any signs/symptoms of infection. Call the office immediately if any occur or go directly to the emergency room. Call with any questions/concerns.  Celesta Gentile, DPM

## 2016-10-09 DIAGNOSIS — D225 Melanocytic nevi of trunk: Secondary | ICD-10-CM | POA: Diagnosis not present

## 2016-10-09 DIAGNOSIS — L821 Other seborrheic keratosis: Secondary | ICD-10-CM | POA: Diagnosis not present

## 2016-10-09 DIAGNOSIS — X32XXXD Exposure to sunlight, subsequent encounter: Secondary | ICD-10-CM | POA: Diagnosis not present

## 2016-10-09 DIAGNOSIS — L570 Actinic keratosis: Secondary | ICD-10-CM | POA: Diagnosis not present

## 2016-12-19 ENCOUNTER — Other Ambulatory Visit: Payer: Self-pay | Admitting: Family Medicine

## 2016-12-31 DIAGNOSIS — H401131 Primary open-angle glaucoma, bilateral, mild stage: Secondary | ICD-10-CM | POA: Diagnosis not present

## 2017-02-05 ENCOUNTER — Other Ambulatory Visit: Payer: Self-pay | Admitting: Family Medicine

## 2017-02-08 NOTE — Progress Notes (Signed)
William Williamson was seen today in the movement disorders clinic for neurologic consultation at the request of [name], DO.  The consultation is for the evaluation of left hand tremor and weakness.  The pt is 83 y.o. male R hand dominant.  He states that he got bit by many ticks in the summer and got headaches.  He then noted L hand tremor some time later (about 1 month ago).  The pt thought that it was lyme disease and he was told that he did not have lyme disease and did not have RMSF.  He notes L hand tremor most when sitting down.  He does not notice it when he is moving.  He has no tremor in the L leg or on the R side   03/31/13 update:  The patient presents today for followup.  He was diagnosed with mild Parkinson's disease on 02/03/2013.  He is currently on no medications.  He does think that tremor is somewhat more prominent but it really doesn't bother him.  When he shaves with the R hand, he may note some tremor on the L.  Otherwise, its more like he can feel it rather than see it.  He had an MRI of the brain since last visit.  There was an absent "swallow to tail sign" which can be seen with PD.  He is exercising.  No falls.  No balance problems.  No syncope.  No n/v, visual distortions or hallucinations.  07/28/13 update:  The patient is seen somewhat earlier than expected.  He does have history of Parkinson's disease.  He has not wanted to be on any medications.  He called recently and said that tremor, which was previously only on the left had spread to the right hand.  The right hand shakes more than the L now.  The L seems "docile" now.    One fall.  Pt was on the bicycle and the dog was on the leash and he fell off the bike.  He walks 3 miles every AM.  In addition, he was concerned because he felt like he had a "sunburn" on his scalp.  He did recently see his primary care physician.  I reviewed those notes.  He was started on Valtrex on April 21 for possible herpetic neuralgia.   He did have shingles vaccine previously.  Is feeling better in that regard.  10/06/13 update:  Pt was started on carbidopa/levodopa 25/100 last visit. He takes it at 6:30/4-5pm/11pm.   He no longer has tremor with ambulation on the R but does still note the tremor with resting the hand in the lap while watching TV.  No falls.  No hallucinations.  No lightheadedness or near syncope.  He is walking 3 miles per day and biking a mile.  He is having some constipation.  04/08/14 update:  The patient is following up today regarding his Parkinson's disease.  He is supposed to be on carbidopa/levodopa 25/100, one tablet 3 times per day but the large majority of the time he only takes it bid and misses the middle of the day dose.  I did review records available to me since last visit.  He saw cardiology on 02/08/2014 for fluctuating blood pressure.  His blood pressure was somewhat elevated in the office and he subsequently had an echocardiogram that demonstrated normal LV ejection fraction with moderate septal hypertrophy.  He was started on amlodipine.  Unfortunatley it caused near syncope and he states that something  else was called in but he hasn't picked it up.  Per records, lisinopril was called in yesterday.  He is also awaiting a nephrology appt for hyperkalemia.  He is having some fatigue.  10/12/14 update:  The patient is seen today back in follow-up, accompanied by his son who supplements the history.  He is on carbidopa/levodopa 25/100, one tablet 3 times per day.  He has trouble remembering the middle of the day dosage.   He has been seeing Dr. Meda Coffee in regards to his high blood pressure.  He had near syncope with amlodipine and was changed to lisinopril.  He is now on 5 mg of lisinopril and doing well and he saw her in May and was doing so well that she recommended a follow-up in one year.  He was treated for an H. pylori infection since our last visit and did see gastroenterology.  Records that were available  to me were reviewed.  His biggest issue is drooling and he wakes up in pools of drool.    01/12/15 update:  The patient is f/u today.  Last visit, I increased his carbidopa/levodopa 25/100 to 2/1/2 but he thought that the medication potentially was making him feel bad so he decreased it back to 1 po tid, although he admits the middle of the day dose 1/2 the time.  He only was up on the increased dosage for a week and then he dropped down on it.  He had trouble describing how he felt "bad" but just stated that he had a "dull headache."  He doesn't describe nausea.  He does think that the higher dose definitely helped the tremor.  No falls since last visit.  He has been exercising daily.  He is still drooling.  He does not wish to pursue myobloc.  He went off lisinopril due to lightheadedness and he feels much better.  Asks me about melanoma risk with levodopa.  05/03/15 update:  The patient follows up today, accompanied by his family who supplements the history.  He is on carbidopa/levodopa 25/100, one tablet 3 times per day (7am/12pm/5:30pm).  Last visit, I felt that he was under dosed, but he did not want to increase the medication because when I did that in the past he thought that that caused headache, although I was not convinced that it was from the medication.  He had one fall since last visit; he was taking a 3 mile walk and about a mile from his home he fell in the ditch and hurt his foot, which is better now.  He has not had hallucinations.  No lightheadedness or near syncope.  He worries about SE of levodopa and worries about it causing glaucoma and melanoma (he read about those things).  Not long after our last visit he saw his orthopedic surgeon at [facility] and he told the patient that he felt that he had a right L5 radiculopathy and he was told to come to me for treatment.  It was recommended to him that he try Neurontin or Lyrica, but he was also told that I prescribed the medication.  I called  the patient to discuss this, as I had no knowledge of his symptoms and he told me he wanted to wait until this visit discussing it, even though this visit was several months from his visit from his orthopedic surgeon.   Pt states that he is no longer having back or radicular sx's.  Does again c/o sialorrhea.  09/30/15 update:  The patient follows up today, accompanied by his wife who supplements the history.  He was on carbidopa/levodopa 25/100, one tablet 3 times per day but increased that to 2/1/2 which is how it was prescribed a long time ago but he did it shortly.  The last few visits, I felt that he was under dosed, but he did not want to increase the medication because when I did that in the past he thought that that caused headache, although I was not convinced that it was from the medication.  He has not had any falls since our last visit.  He has not had hallucinations.  No lightheadedness or near syncope.  "I'm still drooling like crazy."  Still doesn't want to do the botox.    04/16/16 update:  The patient follows up today.    He is on carbidopa/levodopa 25/100, 2 tablets in the morning, one in the afternoon and 2 in the evening.  Pt denies falls. Is having little more tremor.  Noting lip movements.   He is walking 3 miles a day.   Pt denies lightheadedness, near syncope.  No hallucinations.  Mood has been good.  Still noting drooling  09/11/16 update:  Patient seen today in follow-up.  He is on carbidopa/levodopa 25/100, 2 tablets in the morning, one in the afternoon and 2 in the evening.  Pt denies falls.  Has to sit to put on pants now.   Pt has some lightheadedness, but no syncope.  He is drinking 6-8 glasses water per day.  Noting low blood pressure at home.   No hallucinations.  Mood has been good.  States that his stamina is decreasing.  Thinks it is related to low blood pressure.  Had ingrown toenail removed yesterday.  02/12/17 update: Patient seen in follow-up today.  Patient remains on  carbidopa/levodopa 25/100, 2 tablets in the morning, 1 in the afternoon during the evening.   He is shuffling more ; "its like I lost the strength in my legs."  2 days ago he went for a 4 mile walk. He generally goes for a mile walk a day.   Pt denies falls.  Pt denies lightheadedness (much improved), near syncope.  No hallucinations.  Mood has been good.  PREVIOUS MEDICATIONS: none to date  ALLERGIES:   Allergies  Allergen Reactions  . Garlic Nausea Only  . Niacin     REACTION: flushing    CURRENT MEDICATIONS:  Current Outpatient Medications on File Prior to Visit  Medication Sig Dispense Refill  . aspirin 81 MG tablet Take 81 mg by mouth every other day.     . carbidopa-levodopa (SINEMET IR) 25-100 MG tablet 2 in AM, 1 in afternoon, 2 in PM 450 tablet 3  . Cholecalciferol 2000 UNITS CAPS Take 1 capsule by mouth daily.     . dorzolamide-timolol (COSOPT) 22.3-6.8 MG/ML ophthalmic solution Place 1 drop into both eyes 2 (two) times daily.     Marland Kitchen latanoprost (XALATAN) 0.005 % ophthalmic solution Place 1 drop into both eyes at bedtime.     Marland Kitchen levothyroxine (SYNTHROID, LEVOTHROID) 100 MCG tablet TAKE 1 TABLET BY MOUTH ONCE DAILY 90 tablet 1  . polyethylene glycol (MIRALAX / GLYCOLAX) packet Take 17 g by mouth daily.    . simvastatin (ZOCOR) 20 MG tablet TAKE 1 TABLET BY MOUTH IN THE EVENING 90 tablet 0   No current facility-administered medications on file prior to visit.     PAST MEDICAL HISTORY:   Past Medical History:  Diagnosis  Date  . Anemia 1952   post Oromycin for [place]mia  . Anisocoria    post op  . Arthritis   . GERD (gastroesophageal reflux disease)   . Glaucoma    Dr Bing Plume  . Hearing loss in right ear   . Hypercholesteremia   . Hyperthyroidism    s/p RAI  . Parkinson's disease ([place])     PAST SURGICAL HISTORY:   Past Surgical History:  Procedure Laterality Date  . CARDIAC CATHETERIZATION  07/2006   Dr.  Eustace Quail, negative  . CATARACT EXTRACTION, BILATERAL      OS retinal surgery for post op floaters  . COLONOSCOPY     negative X 3  . RAI ablation  02/2008   hyperthyroidism  . SEPTOPLASTY  1970  . VASECTOMY      SOCIAL HISTORY:   Social History   Socioeconomic History  . Marital status: Widowed    Spouse name: Not on file  . Number of children: Not on file  . Years of education: Not on file  . Highest education level: Not on file  Social Needs  . Financial resource strain: Not on file  . Food insecurity - worry: Not on file  . Food insecurity - inability: Not on file  . Transportation needs - medical: Not on file  . Transportation needs - non-medical: Not on file  Occupational History  . Occupation: Retired    Comment: personnel  Tobacco Use  . Smoking status: Former Smoker    Last attempt to quit: 04/02/1968    Years since quitting: 48.8  . Smokeless tobacco: Never Used  Substance and Sexual Activity  . Alcohol use: Yes    Alcohol/week: 3.6 oz    Types: 6 Cans of beer per week  . Drug use: No  . Sexual activity: Not on file  Other Topics Concern  . 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 Father  Deceased at age 72       natural causes  . Mother  Deceased       "old age"  . Brother  Deceased       2, train accident; ? [place]  . Brother  Alive  . Sister  Alive       3, degen arthritis (bedridden at 81 y/o); dementia  . Child  Alive       3, alive and well  . Sister  (Not Specified)  . Brother  (Not Specified)  . Brother  (Not Specified)  . Brother  (Not Specified)  . MGM  (Not Specified)  . Brother  (Not Specified)  . MGF  (Not Specified)  . Neg Hx  (Not Specified)    ROS:  A complete 10 system review of systems was obtained and was unremarkable apart from what is mentioned above.  PHYSICAL EXAMINATION:    VITALS:   Vitals:   02/12/17 1033  BP: 104/80  Pulse: 60  SpO2: 92%  Weight: 178 lb (80.7 kg)  Height: 6\' 1"   (1.854 m)    GEN:  The patient appears stated age and is in NAD. HEENT:  Normocephalic, atraumatic.  The mucous membranes are moist. The superficial temporal arteries are without ropiness or tenderness. CV:  RRR Lungs:  CTAB Neck/HEME:  There are no carotid bruits bilaterally.  Neurological examination:  Orientation: The patient is alert and oriented x3. Fund of  knowledge is appropriate.  Recent and remote memory are intact.  Attention and concentration are normal.    Able to name objects and repeat phrases. Cranial nerves: There is good facial symmetry.  There is facial hypomimia.   The speech is fluent and clear. Soft palate rises symmetrically and there is no tongue deviation. Hearing is decreased to conversational tone. Motor: Strength is 5/5 in the bilateral upper and lower extremities.   Shoulder shrug is equal and symmetric.  There is no pronator drift.  Movement examination: Tone: There is good tone today in the UE Abnormal movements: There is independent RUE/LUE resting tremor, R more than L.  Mild axial dyskinesia. Coordination:  There is no decremation with rapid alternating movements today  Gait and Station: The patient has no difficulty arising out of a deep-seated chair without the use of the hands. The patient's stride length is normal but he is dragging the L leg more than usual.    ASSESSMENT/PLAN:  1.  Parkinsons disease, Hoehn and Yahr stage 2.5-3  - He went back up on the carbidopa/levodopa 25/100 and is on 2/1/2 now.  Less rigidity but still has tremor.  Not that bothersome for the patient and would advise against adding more med for this and he agrees.    -He is having mild dyskinesia but he doesn't notice it.    -he is dragging the L leg.  I think its part of the PD but patient thinks came on acute.  Will recommend MRI brain but he refused.    -talked to him about PT.  He doesn't want to go.  Talked to him about RSB and [employer] biking but he refuses all of these.   Talked to him about ACT program.  He doesn't wish to participate.  I think that he would do better with coordinated program and asked him to think about these things.  2.  constipation, associated with Parkinson's disease  -Using generic form of miralax now (clearlax now) 3.  Hypertension  -he is back off of lisinopril.  Has binder but not using.  4.  Sialorrhea  -Talked about myobloc but he doesn't wish to pursue myobloc. Discussed again today.  Talked to him again about atropine 0.1% opthalmic but he wants to hold on that for now.  -try lemon drops   5.  Follow up is anticipated in the next few months, sooner should new neurologic issues arise.  Much greater than 50% of this visit was spent in counseling about importance of exercise in the disease and coordinating care.  Total face to face time:  25 min

## 2017-02-12 ENCOUNTER — Encounter: Payer: Self-pay | Admitting: Neurology

## 2017-02-12 ENCOUNTER — Ambulatory Visit: Payer: PPO | Admitting: Neurology

## 2017-02-12 VITALS — BP 104/80 | HR 60 | Ht 73.0 in | Wt 178.0 lb

## 2017-02-12 DIAGNOSIS — G20 Parkinson's disease: Secondary | ICD-10-CM | POA: Diagnosis not present

## 2017-02-12 MED ORDER — CARBIDOPA-LEVODOPA 25-100 MG PO TABS
ORAL_TABLET | ORAL | 3 refills | Status: DC
Start: 2017-02-12 — End: 2018-01-15

## 2017-02-19 ENCOUNTER — Ambulatory Visit: Payer: PPO | Admitting: Family Medicine

## 2017-02-19 ENCOUNTER — Encounter: Payer: Self-pay | Admitting: Family Medicine

## 2017-02-19 VITALS — BP 116/82 | Temp 97.6°F | Resp 16 | Ht 73.0 in | Wt 179.6 lb

## 2017-02-19 DIAGNOSIS — E039 Hypothyroidism, unspecified: Secondary | ICD-10-CM

## 2017-02-19 DIAGNOSIS — E785 Hyperlipidemia, unspecified: Secondary | ICD-10-CM | POA: Diagnosis not present

## 2017-02-19 LAB — COMPREHENSIVE METABOLIC PANEL
ALBUMIN: 4.2 g/dL (ref 3.5–5.2)
ALT: 3 U/L (ref 0–53)
AST: 19 U/L (ref 0–37)
Alkaline Phosphatase: 42 U/L (ref 39–117)
BUN: 15 mg/dL (ref 6–23)
CO2: 32 meq/L (ref 19–32)
CREATININE: 1.08 mg/dL (ref 0.40–1.50)
Calcium: 9.6 mg/dL (ref 8.4–10.5)
Chloride: 100 mEq/L (ref 96–112)
GFR: 69.59 mL/min (ref >60.00)
Glucose, Bld: 96 mg/dL (ref 70–99)
Potassium: 4.8 mEq/L (ref 3.5–5.1)
SODIUM: 137 meq/L (ref 135–145)
TOTAL PROTEIN: 6.9 g/dL (ref 6.0–8.3)
Total Bilirubin: 0.6 mg/dL (ref 0.2–1.2)

## 2017-02-19 LAB — LIPID PANEL
CHOL/HDL RATIO: 3
CHOLESTEROL: 134 mg/dL (ref 0–200)
HDL: 41.7 mg/dL (ref >39.00)
LDL Cholesterol: 77 mg/dL (ref 0–99)
NonHDL: 92.29
TRIGLYCERIDES: 76 mg/dL (ref 0.0–149.0)
VLDL: 15.2 mg/dL (ref 0.0–40.0)

## 2017-02-19 LAB — TSH: TSH: 4.01 u[IU]/mL (ref 0.35–4.50)

## 2017-02-19 NOTE — Assessment & Plan Note (Signed)
Tolerating statin, encouraged heart healthy diet, avoid trans fats, minimize simple carbs and saturated fats. Increase exercise as tolerated 

## 2017-02-19 NOTE — Patient Instructions (Signed)

## 2017-02-19 NOTE — Progress Notes (Signed)
Patient ID: William Williamson, male    DOB: 1932/04/25  Age: 83 y.o. MRN: [id number]    Subjective:  Subjective  HPI William Williamson presents for f/u cholesterol.  No new complaints.  He c/o weakness in legs -- he d/w this with DR Tat.  She recommended several programs for him and MRI brain but he refused and still refuses.  Pt walks 1 mile a day with his dog-- a jack russell and does his own yard work Social research officer, government and thinks that is enough exercise.    Review of Systems  Constitutional: Negative for appetite change, diaphoresis, fatigue and unexpected weight change.  Eyes: Negative for pain, redness and visual disturbance.  Respiratory: Negative for cough, chest tightness, shortness of breath and wheezing.   Cardiovascular: Negative for chest pain, palpitations and leg swelling.  Endocrine: Negative for cold intolerance, heat intolerance, polydipsia, polyphagia and polyuria.  Genitourinary: Negative for difficulty urinating, dysuria and frequency.  Neurological: Negative for dizziness, light-headedness, numbness and headaches.    History Past Medical History:  Diagnosis Date  . Anemia 1952   post Oromycin for [place]  . Anisocoria    post op  . Arthritis   . GERD (gastroesophageal reflux disease)   . Glaucoma    Dr Bing Plume  . Hearing loss in right ear   . Hypercholesteremia   . Hyperthyroidism    s/p RAI  . Parkinson's disease [facility])     He has a past surgical history that includes Septoplasty (1970); Cardiac catheterization (07/2006); Vasectomy; RAI ablation (02/2008); Cataract extraction, bilateral; and Colonoscopy.   His family history includes Diabetes in his brother; Heart attack (age of onset: 75) in his maternal grandfather; Hypertension in his brother and sister; Osteoporosis in his mother; Pancreatic cancer in his brother; Prostate cancer in his brother; Stomach cancer in his maternal grandmother.He reports that he quit smoking about 48 years ago. he has never used  smokeless tobacco. He reports that he drinks about 3.6 oz of alcohol per week. He reports that he does not use drugs.  Current Outpatient Medications on File Prior to Visit  Medication Sig Dispense Refill  . aspirin 81 MG tablet Take 81 mg by mouth every other day.     . carbidopa-levodopa (SINEMET IR) 25-100 MG tablet 2 in AM, 1 in afternoon, 2 in PM 450 tablet 3  . Cholecalciferol 2000 UNITS CAPS Take 1 capsule by mouth daily.     Marland Kitchen docusate sodium (COLACE) 100 MG capsule Take 200 mg by mouth daily as needed for mild constipation.    . dorzolamide-timolol (COSOPT) 22.3-6.8 MG/ML ophthalmic solution Place 1 drop into both eyes 2 (two) times daily.     Marland Kitchen latanoprost (XALATAN) 0.005 % ophthalmic solution Place 1 drop into both eyes at bedtime.     Marland Kitchen levothyroxine (SYNTHROID, LEVOTHROID) 100 MCG tablet TAKE 1 TABLET BY MOUTH ONCE DAILY 90 tablet 1  . polyethylene glycol (MIRALAX / GLYCOLAX) packet Take 17 g by mouth daily.    . simvastatin (ZOCOR) 20 MG tablet TAKE 1 TABLET BY MOUTH IN THE EVENING 90 tablet 0   No current facility-administered medications on file prior to visit.      Objective:  Objective  Physical Exam  Constitutional: He is oriented to person, place, and time. Vital signs are normal. He appears well-developed and well-nourished. He is sleeping.  HENT:  Head: Normocephalic and atraumatic.  Mouth/Throat: Oropharynx is clear and moist.  Eyes: EOM are normal. Pupils are equal, round, and reactive to  light.  Neck: Normal range of motion. Neck supple. No thyromegaly present.  Cardiovascular: Normal rate and regular rhythm.  No murmur heard. Pulmonary/Chest: Effort normal and breath sounds normal. No respiratory distress. He has no wheezes. He has no rales. He exhibits no tenderness.  Musculoskeletal: He exhibits no edema or tenderness.  Neurological: He is alert and oriented to person, place, and time.  Skin: Skin is warm and dry.  Psychiatric: He has a normal mood and  affect. His behavior is normal. Judgment and thought content normal.  Nursing note and vitals reviewed.  BP 116/82 (BP Location: Left Arm, Cuff Size: Normal)   Temp 97.6 F (36.4 C) (Oral)   Resp 16   Ht 6\' 1"  (1.854 m)   Wt 179 lb 9.6 oz (81.5 kg)   BMI 23.70 kg/m  Wt Readings from Last 3 Encounters:  02/19/17 179 lb 9.6 oz (81.5 kg)  02/12/17 178 lb (80.7 kg)  09/11/16 180 lb (81.6 kg)     Lab Results  Component Value Date   WBC 7.6 08/16/2016   HGB 14.7 08/16/2016   HCT 43.3 08/16/2016   PLT 214.0 08/16/2016   GLUCOSE 91 08/16/2016   CHOL 138 08/16/2016   TRIG 85.0 08/16/2016   HDL 44.10 08/16/2016   LDLCALC 77 08/16/2016   ALT 4 08/16/2016   AST 19 08/16/2016   NA 138 08/16/2016   K 5.4 (H) 08/16/2016   CL 102 08/16/2016   CREATININE 1.04 08/16/2016   BUN 18 08/16/2016   CO2 32 08/16/2016   TSH 2.70 08/16/2016   PSA 4.85 (H) 08/16/2016   INR 0.9 RATIO 07/26/2006   HGBA1C 5.9 10/09/2006    Dg Abd 1 View  Result Date: 12/13/2015 CLINICAL DATA:  83 y/o  M; nausea, constipation, and weakness. EXAM: ABDOMEN - 1 VIEW COMPARISON:  None. FINDINGS: The bowel gas pattern is normal. No radio-opaque calculi or other significant radiographic abnormality are seen. Mild degenerative changes of the hip joints bilaterally with acetabular fibrocystic changes. Vascular calcifications noted. Moderate amount of stool in the colon. IMPRESSION: Negative. Electronically Signed   By: Kristine Garbe M.D.   On: 12/13/2015 14:50     Assessment & Plan:  Plan  I am having William Williamson maintain his aspirin, Cholecalciferol, dorzolamide-timolol, polyethylene glycol, latanoprost, simvastatin, levothyroxine, carbidopa-levodopa, and docusate sodium.  Meds ordered this encounter  Medications  . docusate sodium (COLACE) 100 MG capsule    Sig: Take 200 mg by mouth daily as needed for mild constipation.    Problem List Items Addressed This Visit      Unprioritized    Hyperlipidemia LDL goal <100 - Primary    Tolerating statin, encouraged heart healthy diet, avoid trans fats, minimize simple carbs and saturated fats. Increase exercise as tolerated      Relevant Orders   Comprehensive metabolic panel   Lipid panel   TSH   Hypothyroidism    Check labs       Relevant Orders   TSH      Follow-up: Return in about 6 months (around 08/19/2017), or if symptoms worsen or fail to improve, for annual exam, fasting.  [name], DO

## 2017-02-19 NOTE — Progress Notes (Deleted)
Patient ID: William Williamson, male   DOB: Jan 31, 1933, 83 y.o.   MRN: [id number]     Subjective:  I acted as a Education administrator for Dr. Carollee Herter.  William Williamson, [place]   Patient ID: William Williamson, male    DOB: 01-29-1933, 83 y.o.   MRN: [id number]  Chief Complaint  Patient presents with  . Hyperlipidemia    HPI  Patient is in today for follow up cholesterol.  Patient Care Team: Carollee Herter, Alferd Apa, DO as PCP - General (Family Medicine) Allyn Kenner, MD as Consulting Physician (Dermatology) Tat, Eustace Quail, DO as Consulting Physician (Neurology) Festus Aloe, MD as Consulting Physician (Urology) Dorothy Spark, MD as Consulting Physician (Cardiology) Daneen Schick, DDS as Consulting Physician (Dentistry)   Past Medical History:  Diagnosis Date  . Anemia 1952   post Oromycin for Tularemia  . Anisocoria    post op  . Arthritis   . GERD (gastroesophageal reflux disease)   . Glaucoma    Dr Bing Plume  . Hearing loss in right ear   . Hypercholesteremia   . Hyperthyroidism    s/p RAI  . Parkinson's disease [facility])     Past Surgical History:  Procedure Laterality Date  . CARDIAC CATHETERIZATION  07/2006   Dr.  Eustace Quail, negative  . CATARACT EXTRACTION, BILATERAL     OS retinal surgery for post op floaters  . COLONOSCOPY     negative X 3  . RAI ablation  02/2008   hyperthyroidism  . SEPTOPLASTY  1970  . VASECTOMY      Family History  Problem Relation Age of Onset  . Osteoporosis Mother   . Hypertension Sister   . Hypertension Brother         X 3  . Diabetes Brother   . Prostate cancer Brother   . Stomach cancer Maternal Grandmother   . Pancreatic cancer Brother        ???  . Heart attack Maternal Grandfather 5  . Stroke Neg Hx     Social History   Socioeconomic History  . Marital status: Widowed    Spouse name: Not on file  . Number of children: Not on file  . Years of education: Not on file  . Highest education level: Not on file  Social Needs  .  Financial resource strain: Not on file  . Food insecurity - worry: Not on file  . Food insecurity - inability: Not on file  . Transportation needs - medical: Not on file  . Transportation needs - non-medical: Not on file  Occupational History  . Occupation: Retired    Comment: personnel  Tobacco Use  . Smoking status: Former Smoker    Last attempt to quit: 04/02/1968    Years since quitting: 48.9  . Smokeless tobacco: Never Used  Substance and Sexual Activity  . Alcohol use: Yes    Alcohol/week: 3.6 oz    Types: 6 Cans of beer per week  . Drug use: No  . Sexual activity: Not on file  Other Topics Concern  . Not on file  Social History Narrative   Son Kris-Power of Attorney    Outpatient Medications Prior to Visit  Medication Sig Dispense Refill  . aspirin 81 MG tablet Take 81 mg by mouth every other day.     . carbidopa-levodopa (SINEMET IR) 25-100 MG tablet 2 in AM, 1 in afternoon, 2 in PM 450 tablet 3  . Cholecalciferol 2000 UNITS CAPS Take 1 capsule by mouth daily.     Marland Kitchen  dorzolamide-timolol (COSOPT) 22.3-6.8 MG/ML ophthalmic solution Place 1 drop into both eyes 2 (two) times daily.     Marland Kitchen latanoprost (XALATAN) 0.005 % ophthalmic solution Place 1 drop into both eyes at bedtime.     Marland Kitchen levothyroxine (SYNTHROID, LEVOTHROID) 100 MCG tablet TAKE 1 TABLET BY MOUTH ONCE DAILY 90 tablet 1  . polyethylene glycol (MIRALAX / GLYCOLAX) packet Take 17 g by mouth daily.    . simvastatin (ZOCOR) 20 MG tablet TAKE 1 TABLET BY MOUTH IN THE EVENING 90 tablet 0   No facility-administered medications prior to visit.     Allergies  Allergen Reactions  . Garlic Nausea Only  . Niacin     REACTION: flushing    Review of Systems  Constitutional: Negative for fever and malaise/fatigue.  HENT: Negative for congestion.   Eyes: Negative for blurred vision.  Respiratory: Negative for cough and shortness of breath.   Cardiovascular: Negative for chest pain, palpitations and leg swelling.    Gastrointestinal: Negative for vomiting.  Musculoskeletal: Negative for back pain.  Skin: Negative for rash.  Neurological: Negative for loss of consciousness and headaches.       Objective:    Physical Exam  BP 116/82 (BP Location: Left Arm, Cuff Size: Normal)   Temp 97.6 F (36.4 C) (Oral)   Resp 16   Ht 6\' 1"  (1.854 m)   Wt 179 lb 9.6 oz (81.5 kg)   BMI 23.70 kg/m  Wt Readings from Last 3 Encounters:  02/19/17 179 lb 9.6 oz (81.5 kg)  02/12/17 178 lb (80.7 kg)  09/11/16 180 lb (81.6 kg)   BP Readings from Last 3 Encounters:  02/19/17 116/82  02/12/17 104/80  09/11/16 (!) 148/72     Immunization History  Administered Date(s) Administered  . Influenza Split 01/09/2011, 01/15/2012  . Influenza Whole 03/11/2007, 01/09/2008, 01/26/2009, 01/23/2010  . Influenza, High Dose Seasonal PF 01/22/2013, 12/23/2014, 01/17/2016  . Influenza,inj,Quad PF,6+ Mos 12/21/2013  . Pneumococcal Conjugate-13 06/21/2014  . Pneumococcal Polysaccharide-23 06/19/2013  . Td 07/07/2008  . Zoster 06/21/2014    Health Maintenance  Topic Date Due  . INFLUENZA VACCINE  10/31/2016  . TETANUS/TDAP  07/08/2018  . PNA vac Low Risk Adult  Completed    Lab Results  Component Value Date   WBC 7.6 08/16/2016   HGB 14.7 08/16/2016   HCT 43.3 08/16/2016   PLT 214.0 08/16/2016   GLUCOSE 91 08/16/2016   CHOL 138 08/16/2016   TRIG 85.0 08/16/2016   HDL 44.10 08/16/2016   LDLCALC 77 08/16/2016   ALT 4 08/16/2016   AST 19 08/16/2016   NA 138 08/16/2016   K 5.4 (H) 08/16/2016   CL 102 08/16/2016   CREATININE 1.04 08/16/2016   BUN 18 08/16/2016   CO2 32 08/16/2016   TSH 2.70 08/16/2016   PSA 4.85 (H) 08/16/2016   INR 0.9 RATIO 07/26/2006   HGBA1C 5.9 10/09/2006    Lab Results  Component Value Date   TSH 2.70 08/16/2016   Lab Results  Component Value Date   WBC 7.6 08/16/2016   HGB 14.7 08/16/2016   HCT 43.3 08/16/2016   MCV 94.9 08/16/2016   PLT 214.0 08/16/2016   Lab Results   Component Value Date   NA 138 08/16/2016   K 5.4 (H) 08/16/2016   CO2 32 08/16/2016   GLUCOSE 91 08/16/2016   BUN 18 08/16/2016   CREATININE 1.04 08/16/2016   BILITOT 0.8 08/16/2016   ALKPHOS 44 08/16/2016   AST 19 08/16/2016  ALT 4 08/16/2016   PROT 7.1 08/16/2016   ALBUMIN 4.6 08/16/2016   CALCIUM 9.5 08/16/2016   GFR 72.78 08/16/2016   Lab Results  Component Value Date   CHOL 138 08/16/2016   Lab Results  Component Value Date   HDL 44.10 08/16/2016   Lab Results  Component Value Date   LDLCALC 77 08/16/2016   Lab Results  Component Value Date   TRIG 85.0 08/16/2016   Lab Results  Component Value Date   CHOLHDL 3 08/16/2016   Lab Results  Component Value Date   HGBA1C 5.9 10/09/2006         Assessment & Plan:   Problem List Items Addressed This Visit    None      I am having William Williamson maintain his aspirin, Cholecalciferol, dorzolamide-timolol, polyethylene glycol, latanoprost, simvastatin, levothyroxine, and carbidopa-levodopa.  No orders of the defined types were placed in this encounter.   {PROVIDER TO DELETE} Jerene Dilling, CMA

## 2017-02-19 NOTE — Assessment & Plan Note (Signed)
Check labs 

## 2017-03-08 ENCOUNTER — Other Ambulatory Visit: Payer: Self-pay | Admitting: Family Medicine

## 2017-07-01 DIAGNOSIS — H401121 Primary open-angle glaucoma, left eye, mild stage: Secondary | ICD-10-CM | POA: Diagnosis not present

## 2017-07-22 ENCOUNTER — Other Ambulatory Visit: Payer: Self-pay | Admitting: Family Medicine

## 2017-07-24 DIAGNOSIS — B078 Other viral warts: Secondary | ICD-10-CM | POA: Diagnosis not present

## 2017-07-24 DIAGNOSIS — X32XXXD Exposure to sunlight, subsequent encounter: Secondary | ICD-10-CM | POA: Diagnosis not present

## 2017-07-24 DIAGNOSIS — D225 Melanocytic nevi of trunk: Secondary | ICD-10-CM | POA: Diagnosis not present

## 2017-07-24 DIAGNOSIS — L570 Actinic keratosis: Secondary | ICD-10-CM | POA: Diagnosis not present
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
## 2017-08-08 ENCOUNTER — Encounter: Payer: Self-pay | Admitting: Neurology

## 2017-08-08 ENCOUNTER — Ambulatory Visit: Payer: PPO | Admitting: Neurology

## 2017-08-08 VITALS — BP 140/90 | HR 64 | Ht 73.0 in | Wt 181.0 lb

## 2017-08-08 DIAGNOSIS — R630 Anorexia: Secondary | ICD-10-CM | POA: Diagnosis not present

## 2017-08-08 DIAGNOSIS — G249 Dystonia, unspecified: Secondary | ICD-10-CM | POA: Diagnosis not present

## 2017-08-08 DIAGNOSIS — L821 Other seborrheic keratosis: Secondary | ICD-10-CM

## 2017-08-08 DIAGNOSIS — R29898 Other symptoms and signs involving the musculoskeletal system: Secondary | ICD-10-CM

## 2017-08-08 DIAGNOSIS — G20 Parkinson's disease: Secondary | ICD-10-CM | POA: Diagnosis not present

## 2017-08-08 NOTE — Patient Instructions (Addendum)
1. We have sent a referral to [facility] for your MRI and they will call you directly to schedule your appt. They are located at [facility]. If you need to contact them directly please call [phone].   [facility] [employer] & Movement Disorders  The [facility] team know that living well with a movement disorder extends far beyond our clinic walls. We are together with you. Our team is passionate about providing resources to you and your loved ones who are living with Parkinson's disease and movement disorders. Participate in these programs and join our community. These resources are free or low cost!   [facility] is adding:   Innovative educational programs for patients and caregivers.   Support groups for patients and caregivers living with Parkinson's disease.   Parkinson's specific exercise programs.   Custom tailored therapeutic programs that will benefit patient's living with Parkinson's disease.   We are in this together. You can help and contribute to grow these programs and resources in our community. 100% of the funds donated to the [facility] stays right here in our community to support patients and their caregivers.  To make a tax deductible contribution:  -ask for a [facility] Parkinson's envelope in the office today.  - call the Office of Institutional Advancement at [phone].     Registration is OPEN!    Third Annual Parkinson's Education Symposium   To register: [email or web address]      Search:  [employer] person attending individually Questions: [place], [place] or [email or web address]

## 2017-08-11 ENCOUNTER — Other Ambulatory Visit: Payer: Self-pay | Admitting: Family Medicine

## 2017-08-14 NOTE — Progress Notes (Signed)
Subjective:   William Williamson is a 84 y.o. male who presents for Medicare Annual/Subsequent preventive examination. Pt states he enjoys watching [place] TV and reading closed caption.  Review of Systems:  No ROS.  Medicare Wellness Visit. Additional risk factors are reflected in the social history.  Cardiac Risk Factors include: advanced age (>80men, >12 women);dyslipidemia;hypertension;male gender Sleep patterns: Sleeps from 10p-7a. Wakes a few times to urinate. Home Safety/Smoke Alarms: Feels safe in home. Smoke alarms in place.  Living environment; residence and Adult nurse: lives alone in 1 story home with basement. Son stays with him sometimes. Walk in shower. No grab rails.   Male:   CCS-   No longer doing routine screening due to age.  PSA-  Lab Results  Component Value Date   PSA 4.85 (H) 08/16/2016   PSA 4.01 (H) 06/23/2015   PSA 5.11 (H) 06/19/2013       Objective:    Vitals: BP 126/70 (BP Location: Left Arm, Patient Position: Sitting, Cuff Size: Normal)   Pulse 63   Ht 6\' 1"  (1.854 m)   Wt 180 lb (81.6 kg)   BMI 23.75 kg/m   Body mass index is 23.75 kg/m.  Advanced Directives 08/19/2017 08/16/2016 06/23/2015 10/12/2014  Does Patient Have a Medical Advance Directive? Yes Yes Yes Yes  Type of Industrial/product designer of [employer] Power of [place]  Does patient want to make changes to medical advance directive? - No - Patient declined No - Patient declined -  Copy of [place] in Chart? Yes No - copy requested No - copy requested -    Tobacco Social History   Tobacco Use  Smoking Status Former Smoker  . Last attempt to quit: 04/02/1968  . Years since quitting: 49.4  Smokeless Tobacco Never Used     Counseling given: Not Answered   Clinical Intake:     Pain : No/denies pain     Past Medical History:  Diagnosis Date  . Anemia 1952   post Oromycin  for [place]  . Anisocoria    post op  . Arthritis   . GERD (gastroesophageal reflux disease)   . Glaucoma    Dr Bing Plume  . Hearing loss in right ear   . Hypercholesteremia   . Hyperthyroidism    s/p RAI  . Parkinson's disease [facility])    Past Surgical History:  Procedure Laterality Date  . CARDIAC CATHETERIZATION  07/2006   Dr.  Eustace Quail, negative  . CATARACT EXTRACTION, BILATERAL     OS retinal surgery for post op floaters  . COLONOSCOPY     negative X 3  . RAI ablation  02/2008   hyperthyroidism  . SEPTOPLASTY  1970  . VASECTOMY     Family History  Problem Relation Age of Onset  . Osteoporosis Mother   . Hypertension Sister   . Hypertension Brother         X 3  . Diabetes Brother   . Prostate cancer Brother   . Stomach cancer Maternal Grandmother   . Pancreatic cancer Brother        ???  . Heart attack Maternal Grandfather 78  . Stroke Neg Hx    Social History   Socioeconomic History  . Marital status: Widowed    Spouse name: Not on file  . Number of children: Not on file  . Years of education: Not on file  . Highest education level:  Not on file  Occupational History  . Occupation: Retired    Comment: Dentist Needs  . Financial resource strain: Not on file  . Food insecurity:    Worry: Not on file    Inability: Not on file  . Transportation needs:    Medical: Not on file    Non-medical: Not on file  Tobacco Use  . Smoking status: Former Smoker    Last attempt to quit: 04/02/1968    Years since quitting: 49.4  . Smokeless tobacco: Never Used  Substance and Sexual Activity  . Alcohol use: Yes    Alcohol/week: 3.6 oz    Types: 6 Cans of beer per week  . Drug use: No  . Sexual activity: Not on file  Lifestyle  . Physical activity:    Days per week: Not on file    Minutes per session: Not on file  . Stress: Not on file  Relationships  . Social connections:    Talks on phone: Not on file    Gets together: Not on file    Attends  religious service: Not on file    Active member of club or organization: Not on file    Attends meetings of clubs or organizations: Not on file    Relationship status: Not on file  Other Topics Concern  . Not on file  Social History Narrative   Son Kris[employer]Power of [place]    Outpatient Encounter Medications as of 08/19/2017  Medication Sig  . carbidopa-levodopa (SINEMET IR) 25-100 MG tablet 2 in AM, 1 in afternoon, 2 in PM  . Cholecalciferol 2000 UNITS CAPS Take 1 capsule by mouth daily.   Marland Kitchen docusate sodium (COLACE) 100 MG capsule Take 200 mg by mouth daily as needed for mild constipation.  . dorzolamide-timolol (COSOPT) 22.3-6.8 MG/ML ophthalmic solution Place 1 drop into both eyes 2 (two) times daily.   Marland Kitchen latanoprost (XALATAN) 0.005 % ophthalmic solution Place 1 drop into both eyes at bedtime.   Marland Kitchen levothyroxine (SYNTHROID, LEVOTHROID) 100 MCG tablet TAKE 1 TABLET BY MOUTH ONCE DAILY  . polyethylene glycol (MIRALAX / GLYCOLAX) packet Take 17 g by mouth daily as needed.   . simvastatin (ZOCOR) 20 MG tablet TAKE 1 TABLET BY MOUTH IN THE EVENING   No facility-administered encounter medications on file as of 08/19/2017.     Activities of Daily Living In your present state of health, do you have any difficulty performing the following activities: 08/19/2017  Hearing? N  Vision? N  Comment wearing glasses. eye doctor every 6 months.  Difficulty concentrating or making decisions? N  Walking or climbing stairs? Y  Dressing or bathing? N  Doing errands, shopping? N  Preparing Food and eating ? N  Using the Toilet? N  In the past six months, have you accidently leaked urine? Y  Do you have problems with loss of bowel control? N  Managing your Medications? N  Managing your Finances? N  Housekeeping or managing your Housekeeping? N  Some recent data might be hidden    Patient Care Team: Carollee Herter, Alferd Apa, DO as PCP - General (Family Medicine) Allyn Kenner, MD as Consulting Physician  (Dermatology) Tat, Eustace Quail, DO as Consulting Physician (Neurology) Festus Aloe, MD as Consulting Physician (Urology) Dorothy Spark, MD as Consulting Physician (Cardiology) Daneen Schick, DDS as Consulting Physician (Dentistry)   Assessment:   This is a routine wellness examination for William Williamson. Physical assessment deferred to PCP.  Exercise Activities and Dietary recommendations Current  Exercise Habits: The patient does not participate in regular exercise at present, Exercise limited by: neurologic condition(s) Diet (meal preparation, eat out, water intake, caffeinated beverages, dairy products, fruits and vegetables): in general, a "healthy" diet  , well balanced Breakfast: raisin bran and strawberries.1 cup of coffee. Lunch: PB sandwich and water Dinner:   Burgers and pintos  Goals    None      Fall Risk Fall Risk  08/19/2017 08/08/2017 02/12/2017 09/11/2016 08/16/2016  Falls in the past year? Yes Yes No No No  Number falls in past yr: 2 or more 2 or more - - -  Injury with Fall? No No - - -  Risk Factor Category  High Fall Risk High Fall Risk - - -  Risk for fall due to : Impaired balance/gait - - - -  Follow up Education provided;Falls prevention discussed Falls evaluation completed - - -    Depression Screen PHQ 2/9 Scores 08/19/2017 08/16/2016 01/17/2016 06/23/2015  PHQ - 2 Score 0 0 0 0  Exception Documentation - - Patient refusal -    Cognitive Function MMSE - Mini Mental State Exam 08/16/2016  Orientation to time 5  Orientation to Place 5  Registration 3  Attention/ Calculation 5  Recall 3  Language- name 2 objects 2  Language- repeat 1  Language- follow 3 step command 3  Language- read & follow direction 1  Write a sentence 1  Copy design 1  Total score 30        Immunization History  Administered Date(s) Administered  . Influenza Split 01/09/2011, 01/15/2012  . Influenza Whole 03/11/2007, 01/09/2008, 01/26/2009, 01/23/2010  . Influenza, High Dose  Seasonal PF 01/22/2013, 12/23/2014, 01/17/2016  . Influenza,inj,Quad PF,6+ Mos 12/21/2013  . Pneumococcal Conjugate-13 06/21/2014  . Pneumococcal Polysaccharide-23 06/19/2013  . Td 07/07/2008  . Zoster 06/21/2014   Screening Tests Health Maintenance  Topic Date Due  . INFLUENZA VACCINE  10/31/2017  . TETANUS/TDAP  07/08/2018  . PNA vac Low Risk Adult  Completed      Plan:  Follow up with DR.Lowne as scheduled  Please schedule your next medicare wellness visit with me in 1 yr.  Continue to eat heart healthy diet (full of fruits, vegetables, whole grains, lean protein, water--limit salt, fat, and sugar intake) and increase physical activity as tolerated.  Continue doing brain stimulating activities (puzzles, reading, adult coloring books, staying active) to keep memory sharp.     I have personally reviewed and noted the following in the patient's chart:   . Medical and social history . Use of alcohol, tobacco or illicit drugs  . Current medications and supplements . Functional ability and status . Nutritional status . Physical activity . Advanced directives . List of other physicians . Hospitalizations, surgeries, and ER visits in previous 12 months . Vitals . Screenings to include cognitive, depression, and falls . Referrals and appointments  In addition, I have reviewed and discussed with patient certain preventive protocols, quality metrics, and best practice recommendations. A written personalized care plan for preventive services as well as general preventive health recommendations were provided to patient.     Shela Nevin, [place]  08/19/2017

## 2017-08-19 ENCOUNTER — Encounter: Payer: Self-pay | Admitting: *Deleted

## 2017-08-19 ENCOUNTER — Ambulatory Visit: Payer: PPO | Admitting: *Deleted

## 2017-08-19 ENCOUNTER — Ambulatory Visit (INDEPENDENT_AMBULATORY_CARE_PROVIDER_SITE_OTHER): Payer: PPO | Admitting: *Deleted

## 2017-08-19 VITALS — BP 126/70 | HR 63 | Ht 73.0 in | Wt 180.0 lb

## 2017-08-19 DIAGNOSIS — Z0000 Encounter for general adult medical examination without abnormal findings: Secondary | ICD-10-CM

## 2017-08-19 NOTE — Patient Instructions (Signed)
Follow up with DR.Lowne as scheduled  Please schedule your next medicare wellness visit with me in 1 yr.  Continue to eat heart healthy diet (full of fruits, vegetables, whole grains, lean protein, water--limit salt, fat, and sugar intake) and increase physical activity as tolerated.  Continue doing brain stimulating activities (puzzles, reading, adult coloring books, staying active) to keep memory sharp.    William Williamson , Thank you for taking time to come for your Medicare Wellness Visit. I appreciate your ongoing commitment to your health goals. Please review the following plan we discussed and let me know if I can assist you in the future.   These are the goals we discussed: Goals    . Maintain independence       This is a list of the screening recommended for you and due dates:  Health Maintenance  Topic Date Due  . Flu Shot  10/31/2017  . Tetanus Vaccine  07/08/2018  . Pneumonia vaccines  Completed     Health Maintenance, Male A healthy lifestyle and preventive care is important for your health and wellness. Ask your health care provider about what schedule of regular examinations is right for you. What should I know about weight and diet? Eat a Healthy Diet  Eat plenty of vegetables, fruits, whole grains, low-fat dairy products, and lean protein.  Do not eat a lot of foods high in solid fats, added sugars, or salt.  Maintain a Healthy Weight Regular exercise can help you achieve or maintain a healthy weight. You should:  Do at least 150 minutes of exercise each week. The exercise should increase your heart rate and make you sweat (moderate-intensity exercise).  Do strength-training exercises at least twice a week.  Watch Your Levels of Cholesterol and Blood Lipids  Have your blood tested for lipids and cholesterol every 5 years starting at 82 years of age. If you are at high risk for heart disease, you should start having your blood tested when you are 82 years  old. You may need to have your cholesterol levels checked more often if: ? Your lipid or cholesterol levels are high. ? You are older than 82 years of age. ? You are at high risk for heart disease.  What should I know about cancer screening? Many types of cancers can be detected early and may often be prevented. Lung Cancer  You should be screened every year for lung cancer if: ? You are a current smoker who has smoked for at least 30 years. ? You are a former smoker who has quit within the past 15 years.  Talk to your health care provider about your screening options, when you should start screening, and how often you should be screened.  Colorectal Cancer  Routine colorectal cancer screening usually begins at 82 years of age and should be repeated every 5-10 years until you are 82 years old. You may need to be screened more often if early forms of precancerous polyps or small growths are found. Your health care provider may recommend screening at an earlier age if you have risk factors for colon cancer.  Your health care provider may recommend using home test kits to check for hidden blood in the stool.  A small camera at the end of a tube can be used to examine your colon (sigmoidoscopy or colonoscopy). This checks for the earliest forms of colorectal cancer.  Prostate and Testicular Cancer  Depending on your age and overall health, your health care provider may  do certain tests to screen for prostate and testicular cancer.  Talk to your health care provider about any symptoms or concerns you have about testicular or prostate cancer.  Skin Cancer  Check your skin from head to toe regularly.  Tell your health care provider about any new moles or changes in moles, especially if: ? There is a change in a mole's size, shape, or color. ? You have a mole that is larger than a pencil eraser.  Always use sunscreen. Apply sunscreen liberally and repeat throughout the day.  Protect  yourself by wearing long sleeves, pants, a wide-brimmed hat, and sunglasses when outside.  What should I know about heart disease, diabetes, and high blood pressure?  If you are 59-92 years of age, have your blood pressure checked every 3-5 years. If you are 20 years of age or older, have your blood pressure checked every year. You should have your blood pressure measured twice-once when you are at a hospital or clinic, and once when you are not at a hospital or clinic. Record the average of the two measurements. To check your blood pressure when you are not at a hospital or clinic, you can use: ? An automated blood pressure machine at a pharmacy. ? A home blood pressure monitor.  Talk to your health care provider about your target blood pressure.  If you are between 61-80 years old, ask your health care provider if you should take aspirin to prevent heart disease.  Have regular diabetes screenings by checking your fasting blood sugar level. ? If you are at a normal weight and have a low risk for diabetes, have this test once every three years after the age of 47. ? If you are overweight and have a high risk for diabetes, consider being tested at a younger age or more often.  A one-time screening for abdominal aortic aneurysm (AAA) by ultrasound is recommended for men aged 16-75 years who are current or former smokers. What should I know about preventing infection? Hepatitis B If you have a higher risk for hepatitis B, you should be screened for this virus. Talk with your health care provider to find out if you are at risk for hepatitis B infection. Hepatitis C Blood testing is recommended for:  Everyone born from [birth year] through 1963.  Anyone with known risk factors for hepatitis C.  Sexually Transmitted Diseases (STDs)  You should be screened each year for STDs including gonorrhea and chlamydia if: ? You are sexually active and are younger than 82 years of age. ? You are older than 82  years of age and your health care provider tells you that you are at risk for this type of infection. ? Your sexual activity has changed since you were last screened and you are at an increased risk for chlamydia or gonorrhea. Ask your health care provider if you are at risk.  Talk with your health care provider about whether you are at high risk of being infected with HIV. Your health care provider may recommend a prescription medicine to help prevent HIV infection.  What else can I do?  Schedule regular health, dental, and eye exams.  Stay current with your vaccines (immunizations).  Do not use any tobacco products, such as cigarettes, chewing tobacco, and e-cigarettes. If you need help quitting, ask your health care provider.  Limit alcohol intake to no more than 2 drinks per day. One drink equals 12 ounces of beer, 5 ounces of wine, or 1 ounces  of hard liquor.  Do not use street drugs.  Do not share needles.  Ask your health care provider for help if you need support or information about quitting drugs.  Tell your health care provider if you often feel depressed.  Tell your health care provider if you have ever been abused or do not feel safe at home. This information is not intended to replace advice given to you by your health care provider. Make sure you discuss any questions you have with your health care provider. Document Released: 09/15/2007 Document Revised: 11/16/2015 Document Reviewed: 12/21/2014 Elsevier Interactive Patient Education  [employer].

## 2017-08-22 ENCOUNTER — Ambulatory Visit (INDEPENDENT_AMBULATORY_CARE_PROVIDER_SITE_OTHER): Payer: PPO | Admitting: Family Medicine

## 2017-08-22 ENCOUNTER — Encounter: Payer: Self-pay | Admitting: Family Medicine

## 2017-08-22 VITALS — BP 120/76 | Temp 97.7°F | Resp 16 | Ht 73.0 in | Wt 178.6 lb

## 2017-08-22 DIAGNOSIS — G20A1 Parkinson's disease without dyskinesia, without mention of fluctuations: Secondary | ICD-10-CM

## 2017-08-22 DIAGNOSIS — G20 Parkinson's disease: Secondary | ICD-10-CM

## 2017-08-22 DIAGNOSIS — Z0000 Encounter for general adult medical examination without abnormal findings: Secondary | ICD-10-CM | POA: Diagnosis not present

## 2017-08-22 DIAGNOSIS — E039 Hypothyroidism, unspecified: Secondary | ICD-10-CM | POA: Diagnosis not present

## 2017-08-22 DIAGNOSIS — E785 Hyperlipidemia, unspecified: Secondary | ICD-10-CM | POA: Diagnosis not present

## 2017-08-22 DIAGNOSIS — I10 Essential (primary) hypertension: Secondary | ICD-10-CM | POA: Diagnosis not present

## 2017-08-22 LAB — COMPREHENSIVE METABOLIC PANEL
ALK PHOS: 39 U/L (ref 39–117)
ALT: 3 U/L (ref 0–53)
AST: 18 U/L (ref 0–37)
Albumin: 4.3 g/dL (ref 3.5–5.2)
BUN: 18 mg/dL (ref 6–23)
CHLORIDE: 101 meq/L (ref 96–112)
CO2: 33 mEq/L — ABNORMAL HIGH (ref 19–32)
Calcium: 9.2 mg/dL (ref 8.4–10.5)
Creatinine, Ser: 1.17 mg/dL (ref 0.40–1.50)
GFR: 63.37 mL/min (ref >60.00)
GLUCOSE: 87 mg/dL (ref 70–99)
POTASSIUM: 5 meq/L (ref 3.5–5.1)
SODIUM: 137 meq/L (ref 135–145)
TOTAL PROTEIN: 6.7 g/dL (ref 6.0–8.3)
Total Bilirubin: 0.7 mg/dL (ref 0.2–1.2)

## 2017-08-22 LAB — LIPID PANEL
CHOLESTEROL: 116 mg/dL (ref 0–200)
HDL: 39.1 mg/dL (ref >39.00)
LDL CALC: 64 mg/dL (ref 0–99)
NONHDL: 77.18
Total CHOL/HDL Ratio: 3
Triglycerides: 64 mg/dL (ref 0.0–149.0)
VLDL: 12.8 mg/dL (ref 0.0–40.0)

## 2017-08-22 LAB — CBC WITH DIFFERENTIAL/PLATELET
BASOS PCT: 0.2 % (ref 0.0–3.0)
Basophils Absolute: 0 10*3/uL (ref 0.0–0.1)
EOS PCT: 2.5 % (ref 0.0–5.0)
Eosinophils Absolute: 0.2 10*3/uL (ref 0.0–0.7)
HCT: 42.6 % (ref 39.0–52.0)
Hemoglobin: 14.4 g/dL (ref 13.0–17.0)
LYMPHS ABS: 2.5 10*3/uL (ref 0.7–4.0)
Lymphocytes Relative: 33 % (ref 12.0–46.0)
MCHC: 33.7 g/dL (ref 30.0–36.0)
MCV: 95.3 fl (ref 78.0–100.0)
MONO ABS: 0.6 10*3/uL (ref 0.1–1.0)
MONOS PCT: 7.4 % (ref 3.0–12.0)
NEUTROS ABS: 4.4 10*3/uL (ref 1.4–7.7)
NEUTROS PCT: 56.9 % (ref 43.0–77.0)
Platelets: 200 10*3/uL (ref 150.0–400.0)
RBC: 4.47 Mil/uL (ref 4.22–5.81)
RDW: 13 % (ref 11.5–15.5)
WBC: 7.7 10*3/uL (ref 4.0–10.5)

## 2017-08-22 LAB — TSH: TSH: 8.51 u[IU]/mL — ABNORMAL HIGH (ref 0.35–4.50)

## 2017-08-22 MED ORDER — NONFORMULARY OR COMPOUNDED ITEM: 0 refills | Status: DC

## 2017-08-22 NOTE — Progress Notes (Signed)
Patient ID: William Williamson, male    DOB: 12/25/1932  Age: 84 y.o. MRN: [id number]    Subjective:  Subjective  HPI William Williamson presents for cpe.  Pt sees urology for his prostate and neuro for parkinsons.  He states the PD is worsening.  He is having more trouble walking and is falling more.  Dr Tat offered PT but the pt refused.  He is now willing to try this and knows he probably needs  Gilford Rile /cane Review of Systems  Constitutional: Positive for activity change. Negative for fever.  HENT: Negative for congestion, ear pain, hearing loss, nosebleeds, postnasal drip, rhinorrhea, sinus pressure, sneezing and tinnitus.   Eyes: Negative for photophobia, discharge, itching and visual disturbance.  Respiratory: Negative.  Negative for shortness of breath.   Cardiovascular: Negative.  Negative for chest pain, palpitations and leg swelling.  Gastrointestinal: Negative for abdominal distention, abdominal pain, anal bleeding, blood in stool, constipation and nausea.  Endocrine: Negative.   Genitourinary: Negative.  Negative for dysuria and frequency.  Musculoskeletal: Positive for gait problem. Negative for arthralgias.  Skin: Negative.  Negative for rash.  Allergic/Immunologic: Negative.  Negative for environmental allergies.  Neurological: Positive for weakness. Negative for dizziness, light-headedness, numbness and headaches.  Psychiatric/Behavioral: Negative for agitation, confusion, decreased concentration, dysphoric mood, sleep disturbance and suicidal ideas. The patient is not nervous/anxious.     History Past Medical History:  Diagnosis Date  . Anemia 1952   post Oromycin for [place]  . Anisocoria    post op  . Arthritis   . GERD (gastroesophageal reflux disease)   . Glaucoma    Dr Bing Plume  . Hearing loss in right ear   . Hypercholesteremia   . Hyperthyroidism    s/p RAI  . Parkinson's disease [facility])     He has a past surgical history that includes Septoplasty  (1970); Cardiac catheterization (07/2006); Vasectomy; RAI ablation (02/2008); Cataract extraction, bilateral; and Colonoscopy.   His family history includes Diabetes in his brother; Heart attack (age of onset: 75) in his maternal grandfather; Hypertension in his brother and sister; Osteoporosis in his mother; Pancreatic cancer in his brother; Prostate cancer in his brother; Stomach cancer in his maternal grandmother.He reports that he quit smoking about 49 years ago. He has never used smokeless tobacco. He reports that he drinks about 3.6 oz of alcohol per week. He reports that he does not use drugs.  Current Outpatient Medications on File Prior to Visit  Medication Sig Dispense Refill  . carbidopa-levodopa (SINEMET IR) 25-100 MG tablet 2 in AM, 1 in afternoon, 2 in PM 450 tablet 3  . Cholecalciferol 2000 UNITS CAPS Take 1 capsule by mouth daily.     Marland Kitchen docusate sodium (COLACE) 100 MG capsule Take 200 mg by mouth daily as needed for mild constipation.    . dorzolamide-timolol (COSOPT) 22.3-6.8 MG/ML ophthalmic solution Place 1 drop into both eyes 2 (two) times daily.     Marland Kitchen latanoprost (XALATAN) 0.005 % ophthalmic solution Place 1 drop into both eyes at bedtime.     Marland Kitchen levothyroxine (SYNTHROID, LEVOTHROID) 100 MCG tablet TAKE 1 TABLET BY MOUTH ONCE DAILY 90 tablet 1  . polyethylene glycol (MIRALAX / GLYCOLAX) packet Take 17 g by mouth daily as needed.     . simvastatin (ZOCOR) 20 MG tablet TAKE 1 TABLET BY MOUTH IN THE EVENING 90 tablet 0   No current facility-administered medications on file prior to visit.      Objective:  Objective  Physical  Exam  Constitutional: He is oriented to person, place, and time. He appears well-developed and well-nourished. No distress.  HENT:  Head: Normocephalic and atraumatic.  Right Ear: External ear normal.  Left Ear: External ear normal.  Nose: Nose normal.  Mouth/Throat: Oropharynx is clear and moist. No oropharyngeal exudate.  Eyes: Pupils are equal,  round, and reactive to light. Conjunctivae and EOM are normal. Right eye exhibits no discharge. Left eye exhibits no discharge.  Neck: Normal range of motion. Neck supple. No JVD present. No thyromegaly present.  Cardiovascular: Normal rate, regular rhythm and intact distal pulses. Exam reveals no gallop and no friction rub.  No murmur heard. Pulmonary/Chest: Effort normal and breath sounds normal. No respiratory distress. He has no wheezes. He has no rales. He exhibits no tenderness.  Abdominal: Soft. Bowel sounds are normal. He exhibits no distension and no mass. There is no tenderness. There is no rebound and no guarding.  Genitourinary: Rectal exam shows guaiac negative stool.  Musculoskeletal: Normal range of motion. He exhibits no edema or tenderness.       Legs: Lymphadenopathy:    He has no cervical adenopathy.  Neurological: He is alert and oriented to person, place, and time. He has normal reflexes. He displays normal reflexes. He exhibits normal muscle tone.  Skin: Skin is warm and dry. No rash noted. He is not diaphoretic. No erythema. No pallor.  Psychiatric: He has a normal mood and affect. His behavior is normal. Judgment and thought content normal.  Nursing note and vitals reviewed.  BP 120/76 (BP Location: Left Arm, Cuff Size: Normal)   Temp 97.7 F (36.5 C) (Oral)   Resp 16   Ht 6\' 1"  (1.854 m)   Wt 178 lb 9.6 oz (81 kg)   BMI 23.56 kg/m  Wt Readings from Last 3 Encounters:  08/22/17 178 lb 9.6 oz (81 kg)  08/19/17 180 lb (81.6 kg)  08/08/17 181 lb (82.1 kg)     Lab Results  Component Value Date   WBC 7.7 08/22/2017   HGB 14.4 08/22/2017   HCT 42.6 08/22/2017   PLT 200.0 08/22/2017   GLUCOSE 87 08/22/2017   CHOL 116 08/22/2017   TRIG 64.0 08/22/2017   HDL 39.10 08/22/2017   LDLCALC 64 08/22/2017   ALT 3 08/22/2017   AST 18 08/22/2017   NA 137 08/22/2017   K 5.0 08/22/2017   CL 101 08/22/2017   CREATININE 1.17 08/22/2017   BUN 18 08/22/2017   CO2 33  (H) 08/22/2017   TSH 8.51 (H) 08/22/2017   PSA 4.85 (H) 08/16/2016   INR 0.9 RATIO 07/26/2006   HGBA1C 5.9 10/09/2006    Dg Abd 1 View  Result Date: 12/13/2015 CLINICAL DATA:  84 y/o  M; nausea, constipation, and weakness. EXAM: ABDOMEN - 1 VIEW COMPARISON:  None. FINDINGS: The bowel gas pattern is normal. No radio-opaque calculi or other significant radiographic abnormality are seen. Mild degenerative changes of the hip joints bilaterally with acetabular fibrocystic changes. Vascular calcifications noted. Moderate amount of stool in the colon. IMPRESSION: Negative. Electronically Signed   By: Kristine Garbe M.D.   On: 12/13/2015 14:50     Assessment & Plan:  Plan  I am having William Williamson start on NONFORMULARY OR COMPOUNDED ITEM. I am also having him maintain his Cholecalciferol, dorzolamide-timolol, polyethylene glycol, latanoprost, simvastatin, carbidopa-levodopa, docusate sodium, and levothyroxine.  Meds ordered this encounter  Medications  . NONFORMULARY OR COMPOUNDED ITEM    Sig: [employer]  Dispense:  1 each    Refill:  0    Problem List Items Addressed This Visit      Unprioritized   Essential hypertension    Well controlled, no changes to meds. Encouraged heart healthy diet such as the DASH diet and exercise as tolerated.       Hyperlipidemia LDL goal <100    Tolerating statin, encouraged heart healthy diet, avoid trans fats, minimize simple carbs and saturated fats. Increase exercise as tolerated      Hypothyroidism - Primary    Check labs con't meds      Relevant Orders   CBC with Differential/Platelet (Completed)   TSH (Completed)   Parkinson disease ([place])    Per neuro      Relevant Medications   NONFORMULARY OR COMPOUNDED ITEM   Other Relevant Orders   Ambulatory referral to [facility]    Other Visit Diagnoses    Hyperlipidemia, unspecified hyperlipidemia type       Relevant Orders   CBC with  Differential/Platelet (Completed)   Comprehensive metabolic panel (Completed)   Lipid panel (Completed)   TSH (Completed)      Follow-up: Return in about 6 months (around 02/22/2018), or if symptoms worsen or fail to improve.  [name], DO

## 2017-08-22 NOTE — Patient Instructions (Signed)
Preventive Care 82 Years and Older, Male Preventive care refers to lifestyle choices and visits with your health care provider that can promote health and wellness. What does preventive care include?  A yearly physical exam. This is also called an annual well check.  Dental exams once or twice a year.  Routine eye exams. Ask your health care provider how often you should have your eyes checked.  Personal lifestyle choices, including: ? Daily care of your teeth and gums. ? Regular physical activity. ? Eating a healthy diet. ? Avoiding tobacco and drug use. ? Limiting alcohol use. ? Practicing safe sex. ? Taking low doses of aspirin every day. ? Taking vitamin and mineral supplements as recommended by your health care provider. What happens during an annual well check? The services and screenings done by your health care provider during your annual well check will depend on your age, overall health, lifestyle risk factors, and family history of disease. Counseling Your health care provider may ask you questions about your:  Alcohol use.  Tobacco use.  Drug use.  Emotional well-being.  Home and relationship well-being.  Sexual activity.  Eating habits.  History of falls.  Memory and ability to understand (cognition).  Work and work environment.  Screening You may have the following tests or measurements:  Height, weight, and BMI.  Blood pressure.  Lipid and cholesterol levels. These may be checked every 5 years, or more frequently if you are over 50 years old.  Skin check.  Lung cancer screening. You may have this screening every year starting at age 55 if you have a 30-pack-year history of smoking and currently smoke or have quit within the past 15 years.  Fecal occult blood test (FOBT) of the stool. You may have this test every year starting at age 50.  Flexible sigmoidoscopy or colonoscopy. You may have a sigmoidoscopy every 5 years or a colonoscopy every 10  years starting at age 50.  Prostate cancer screening. Recommendations will vary depending on your family history and other risks.  Hepatitis C blood test.  Hepatitis B blood test.  Sexually transmitted disease (STD) testing.  Diabetes screening. This is done by checking your blood sugar (glucose) after you have not eaten for a while (fasting). You may have this done every 1-3 years.  Abdominal aortic aneurysm (AAA) screening. You may need this if you are a current or former smoker.  Osteoporosis. You may be screened starting at age 70 if you are at high risk.  Talk with your health care provider about your test results, treatment options, and if necessary, the need for more tests. Vaccines Your health care provider may recommend certain vaccines, such as:  Influenza vaccine. This is recommended every year.  Tetanus, diphtheria, and acellular pertussis (Tdap, Td) vaccine. You may need a Td booster every 10 years.  Varicella vaccine. You may need this if you have not been vaccinated.  Zoster vaccine. You may need this after age 60.  Measles, mumps, and rubella (MMR) vaccine. You may need at least one dose of MMR if you were born in 1957 or later. You may also need a second dose.  Pneumococcal 13-valent conjugate (PCV13) vaccine. One dose is recommended after age 65.  Pneumococcal polysaccharide (PPSV23) vaccine. One dose is recommended after age 65.  Meningococcal vaccine. You may need this if you have certain conditions.  Hepatitis A vaccine. You may need this if you have certain conditions or if you travel or work in places where you   may be exposed to hepatitis A.  Hepatitis B vaccine. You may need this if you have certain conditions or if you travel or work in places where you may be exposed to hepatitis B.  Haemophilus influenzae type b (Hib) vaccine. You may need this if you have certain risk factors.  Talk to your health care provider about which screenings and vaccines  you need and how often you need them. This information is not intended to replace advice given to you by your health care provider. Make sure you discuss any questions you have with your health care provider. Document Released: 04/15/2015 Document Revised: 12/07/2015 Document Reviewed: 01/18/2015 Elsevier Interactive Patient Education  2018 [employer] Inc.  

## 2017-08-23 NOTE — Assessment & Plan Note (Signed)
Tolerating statin, encouraged heart healthy diet, avoid trans fats, minimize simple carbs and saturated fats. Increase exercise as tolerated 

## 2017-08-23 NOTE — Assessment & Plan Note (Signed)
Well controlled, no changes to meds. Encouraged heart healthy diet such as the DASH diet and exercise as tolerated.  °

## 2017-08-23 NOTE — Assessment & Plan Note (Signed)
Check labs con't meds 

## 2017-08-23 NOTE — Assessment & Plan Note (Signed)
Per neuro 

## 2017-08-27 ENCOUNTER — Other Ambulatory Visit: Payer: Self-pay

## 2017-08-27 MED ORDER — LEVOTHYROXINE SODIUM 125 MCG PO TABS: 125.0000 ug | ORAL_TABLET | Freq: Every day | ORAL | 2 refills | Status: DC

## 2017-08-27 NOTE — Addendum Note (Signed)
Addended by: Bartholome Bill on: 08/27/2017 05:26 PM   Modules accepted: Orders

## 2017-08-29 ENCOUNTER — Telehealth: Payer: Self-pay | Admitting: *Deleted

## 2017-08-29 DIAGNOSIS — E039 Hypothyroidism, unspecified: Secondary | ICD-10-CM

## 2017-08-29 NOTE — Telephone Encounter (Signed)
Copied from [place] [id number]. Topic: Appointment Scheduling - Scheduling Inquiry for Clinic >> Aug 28, 2017  9:13 AM Boyd Kerbs wrote: Reason for CRM:   Pt got lab results off mychart and told him of the prescription called in.   He is saying he would need an appt. For labs or nurse in 2 months.  I am not seeing anything about this.    Please clarify and let pt. Know or put on my chart.

## 2017-08-29 NOTE — Telephone Encounter (Signed)
Patient notified and made appt for 2 months

## 2017-08-30 ENCOUNTER — Ambulatory Visit
Admission: RE | Admit: 2017-08-30 | Discharge: 2017-08-30 | Disposition: A | Discharge status: Home / Self Care | Payer: PPO | Source: Ambulatory Visit | Attending: Neurology | Admitting: Neurology

## 2017-08-30 ENCOUNTER — Telehealth: Payer: Self-pay | Admitting: Neurology

## 2017-08-30 DIAGNOSIS — R29898 Other symptoms and signs involving the musculoskeletal system: Secondary | ICD-10-CM

## 2017-08-30 DIAGNOSIS — M6281 Muscle weakness (generalized): Secondary | ICD-10-CM | POA: Diagnosis not present

## 2017-08-30 NOTE — Telephone Encounter (Signed)
-----   Message from [place], DO sent at 08/30/2017  9:27 AM EDT ----- Let pt know that I reviewed MRI brain.  It looks really good and no changes since 2014.

## 2017-08-30 NOTE — Telephone Encounter (Signed)
Mychart message sent to patient.

## 2017-09-10 ENCOUNTER — Telehealth: Payer: Self-pay | Admitting: *Deleted

## 2017-09-10 NOTE — Telephone Encounter (Signed)
Received Physician Orders from [facility]; forwarded to provider/SLS 06/11

## 2017-10-22 ENCOUNTER — Other Ambulatory Visit (INDEPENDENT_AMBULATORY_CARE_PROVIDER_SITE_OTHER): Payer: PPO

## 2017-10-22 DIAGNOSIS — E039 Hypothyroidism, unspecified: Secondary | ICD-10-CM

## 2017-10-22 LAB — TSH: TSH: 1.04 u[IU]/mL (ref 0.35–4.50)

## 2017-11-01 IMAGING — DX DG ABDOMEN 1V
2 series · 2 of 2 positions shown · non-contrast
Comparison: None.

CLINICAL DATA: 82 y/o  M; nausea, constipation, and weakness.

EXAM:
ABDOMEN - 1 VIEW

[abdomen kub (1 of 2)]
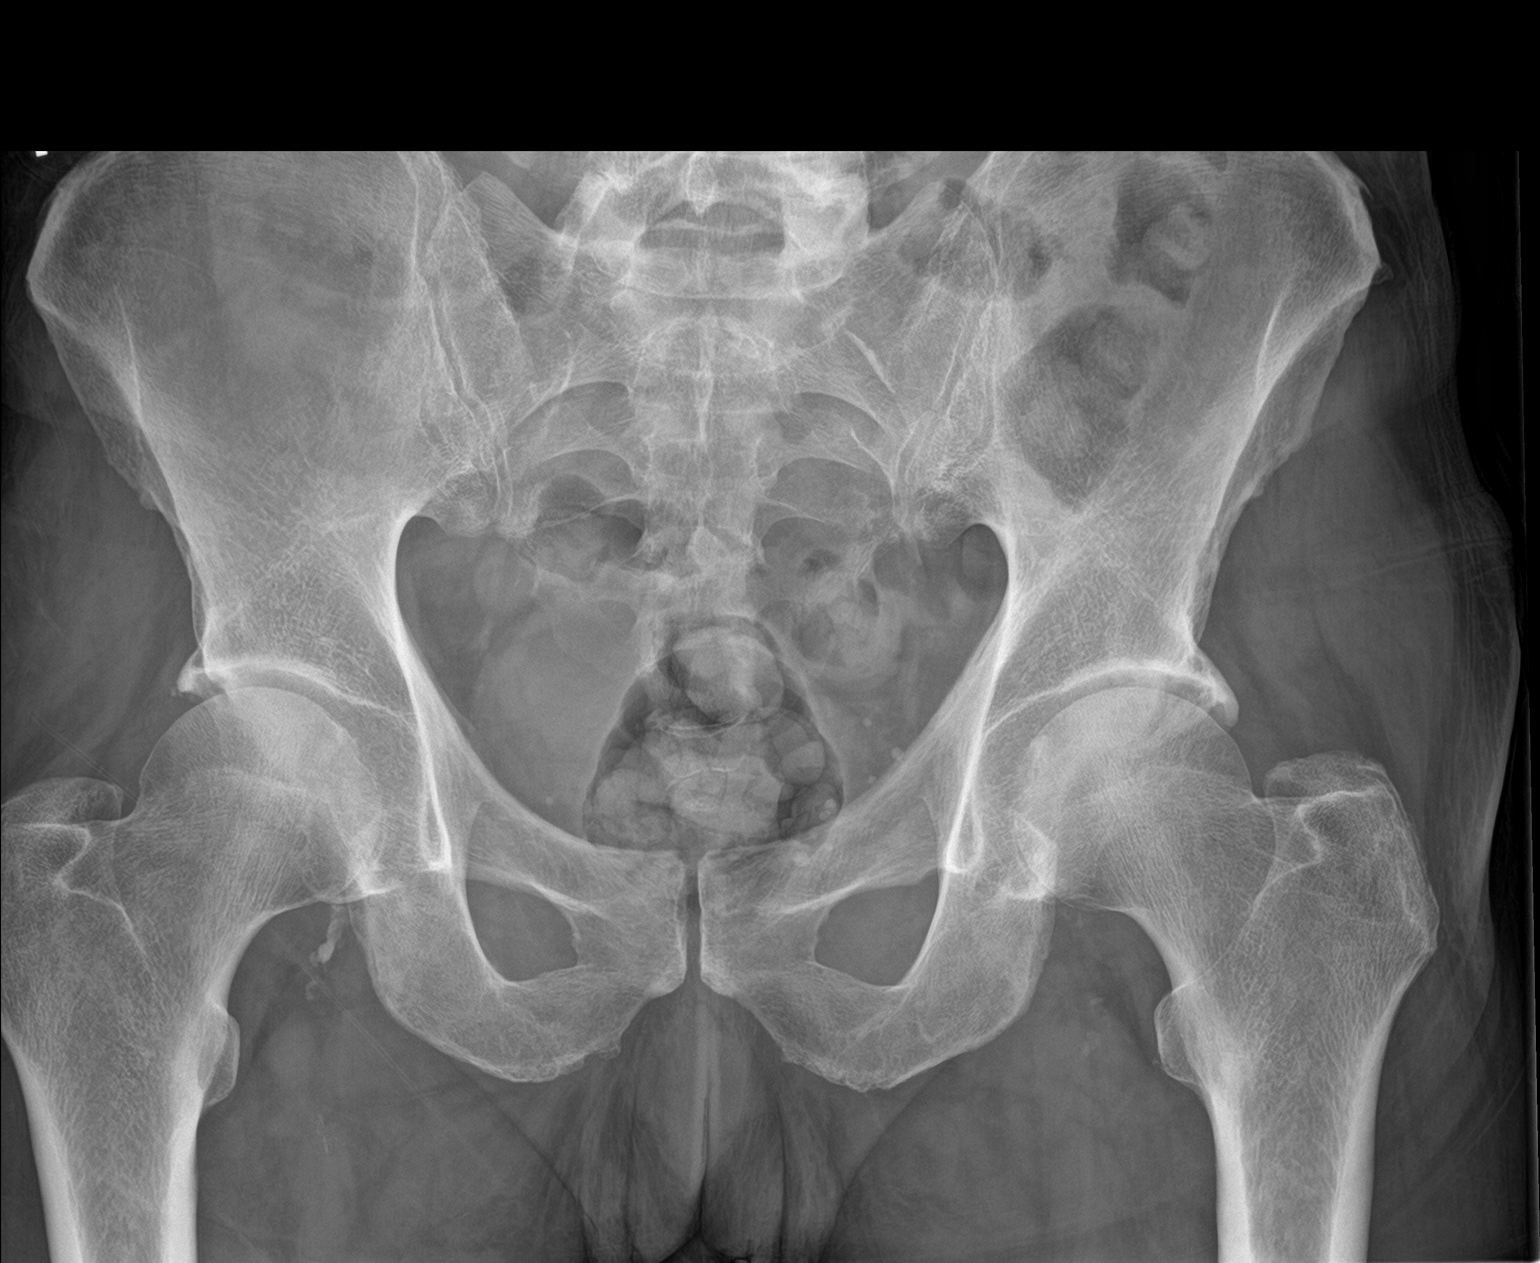

[abdomen kub (2 of 2)]
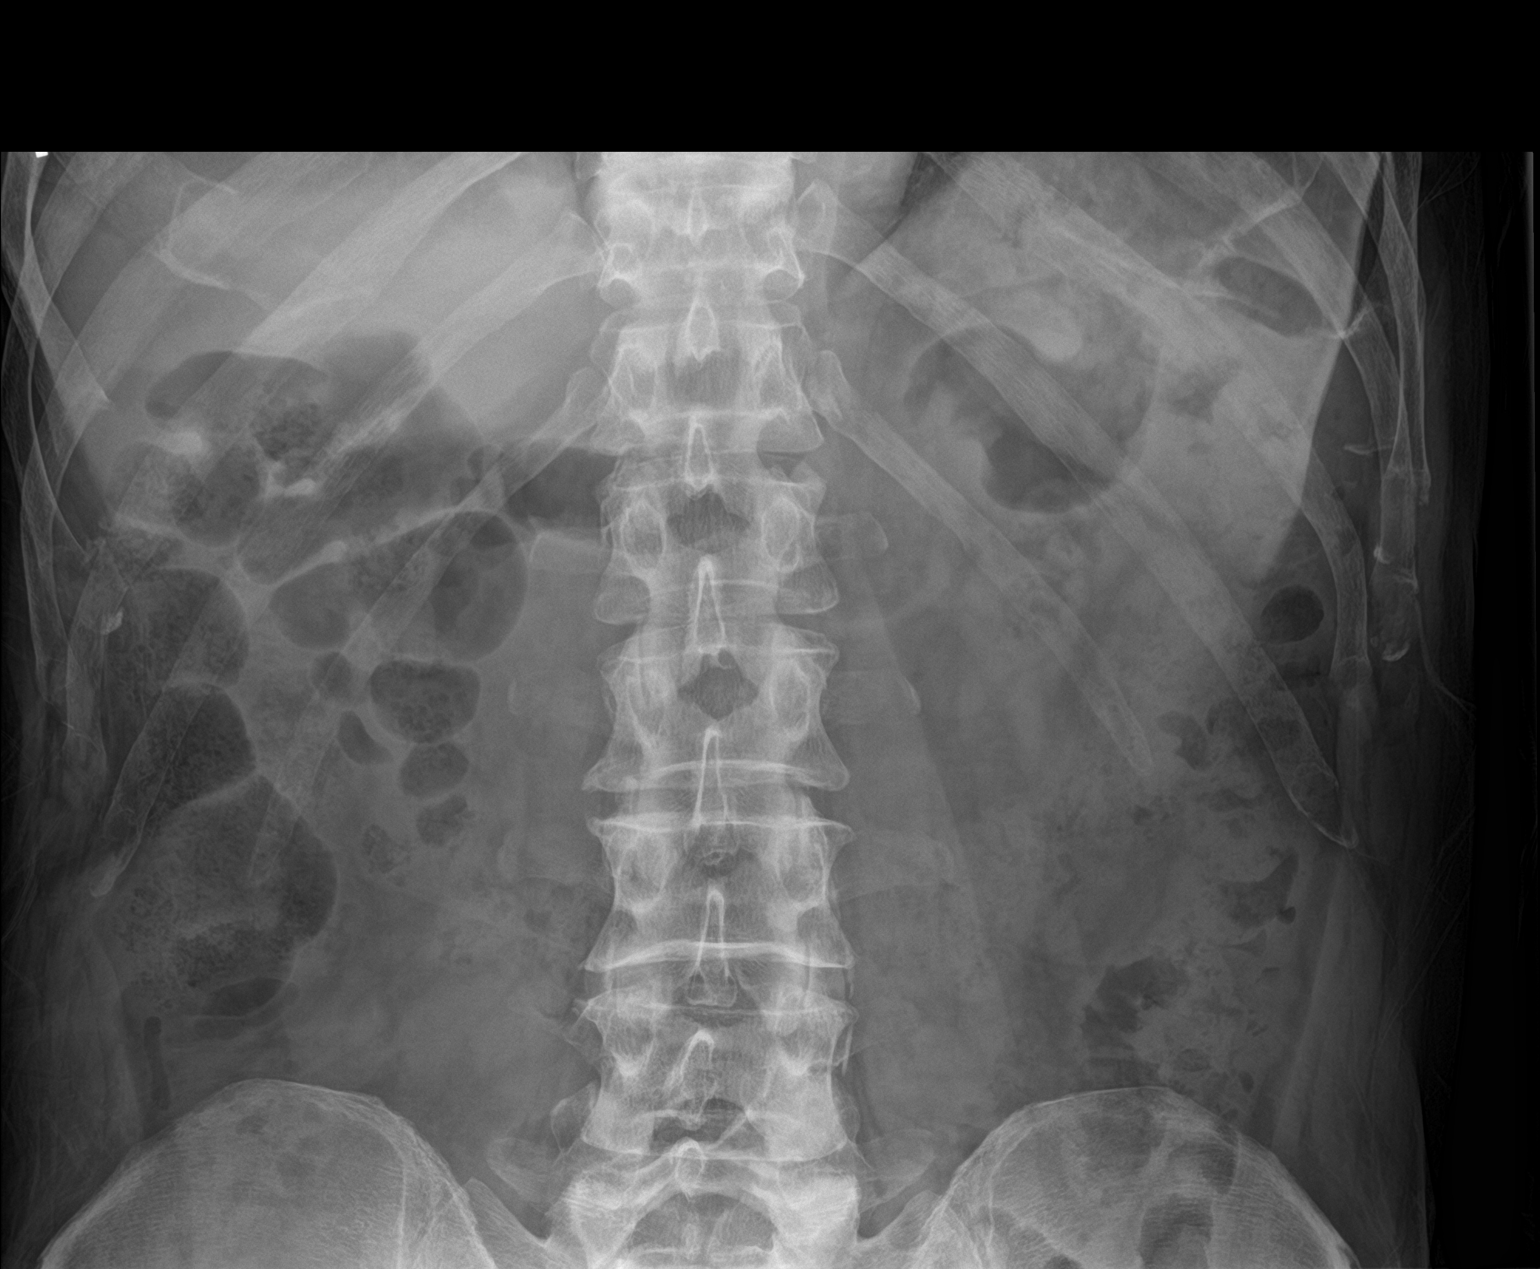

[2 of 2 positions shown; findings below may reference images not displayed]

FINDINGS: The bowel gas pattern is normal. No radio-opaque calculi or other
significant radiographic abnormality are seen. Mild degenerative
changes of the hip joints bilaterally with acetabular fibrocystic
changes. Vascular calcifications noted. Moderate amount of stool in
the colon.
IMPRESSION: Negative.

By: Anxhii Artistike M.D.

## 2017-11-30 ENCOUNTER — Other Ambulatory Visit: Payer: Self-pay | Admitting: Family Medicine

## 2018-01-01 DIAGNOSIS — H26491 Other secondary cataract, right eye: Secondary | ICD-10-CM | POA: Diagnosis not present

## 2018-01-01 DIAGNOSIS — H401131 Primary open-angle glaucoma, bilateral, mild stage: Secondary | ICD-10-CM | POA: Diagnosis not present

## 2018-01-10 DIAGNOSIS — X32XXXD Exposure to sunlight, subsequent encounter: Secondary | ICD-10-CM | POA: Diagnosis not present

## 2018-01-10 DIAGNOSIS — D225 Melanocytic nevi of trunk: Secondary | ICD-10-CM | POA: Diagnosis not present

## 2018-01-10 DIAGNOSIS — L570 Actinic keratosis: Secondary | ICD-10-CM | POA: Diagnosis not present

## 2018-01-13 NOTE — Progress Notes (Signed)
William Williamson was seen today in the movement disorders clinic for neurologic consultation at the request of [name], DO.  The consultation is for the evaluation of left hand tremor and weakness.  The pt is 84 y.o. male R hand dominant.  He states that he got bit by many ticks in the summer and got headaches.  He then noted L hand tremor some time later (about 1 month ago).  The pt thought that it was lyme disease and he was told that he did not have lyme disease and did not have RMSF.  He notes L hand tremor most when sitting down.  He does not notice it when he is moving.  He has no tremor in the L leg or on the R side   03/31/13 update:  The patient presents today for followup.  He was diagnosed with mild Parkinson's disease on 02/03/2013.  He is currently on no medications.  He does think that tremor is somewhat more prominent but it really doesn't bother him.  When he shaves with the R hand, he may note some tremor on the L.  Otherwise, its more like he can feel it rather than see it.  He had an MRI of the brain since last visit.  There was an absent "swallow to tail sign" which can be seen with PD.  He is exercising.  No falls.  No balance problems.  No syncope.  No n/v, visual distortions or hallucinations.  07/28/13 update:  The patient is seen somewhat earlier than expected.  He does have history of Parkinson's disease.  He has not wanted to be on any medications.  He called recently and said that tremor, which was previously only on the left had spread to the right hand.  The right hand shakes more than the L now.  The L seems "docile" now.    One fall.  Pt was on the bicycle and the dog was on the leash and he fell off the bike.  He walks 3 miles every AM.  In addition, he was concerned because he felt like he had a "sunburn" on his scalp.  He did recently see his primary care physician.  I reviewed those notes.  He was started on Valtrex on April 21 for possible herpetic neuralgia.   He did have shingles vaccine previously.  Is feeling better in that regard.  10/06/13 update:  Pt was started on carbidopa/levodopa 25/100 last visit. He takes it at 6:30/4-5pm/11pm.   He no longer has tremor with ambulation on the R but does still note the tremor with resting the hand in the lap while watching TV.  No falls.  No hallucinations.  No lightheadedness or near syncope.  He is walking 3 miles per day and biking a mile.  He is having some constipation.  04/08/14 update:  The patient is following up today regarding his Parkinson's disease.  He is supposed to be on carbidopa/levodopa 25/100, one tablet 3 times per day but the large majority of the time he only takes it bid and misses the middle of the day dose.  I did review records available to me since last visit.  He saw cardiology on 02/08/2014 for fluctuating blood pressure.  His blood pressure was somewhat elevated in the office and he subsequently had an echocardiogram that demonstrated normal LV ejection fraction with moderate septal hypertrophy.  He was started on amlodipine.  Unfortunatley it caused near syncope and he states that something  else was called in but he hasn't picked it up.  Per records, lisinopril was called in yesterday.  He is also awaiting a nephrology appt for hyperkalemia.  He is having some fatigue.  10/12/14 update:  The patient is seen today back in follow-up, accompanied by his son who supplements the history.  He is on carbidopa/levodopa 25/100, one tablet 3 times per day.  He has trouble remembering the middle of the day dosage.   He has been seeing Dr. Meda Coffee in regards to his high blood pressure.  He had near syncope with amlodipine and was changed to lisinopril.  He is now on 5 mg of lisinopril and doing well and he saw her in May and was doing so well that she recommended a follow-up in one year.  He was treated for an H. pylori infection since our last visit and did see gastroenterology.  Records that were available  to me were reviewed.  His biggest issue is drooling and he wakes up in pools of drool.    01/12/15 update:  The patient is f/u today.  Last visit, I increased his carbidopa/levodopa 25/100 to 2/1/2 but he thought that the medication potentially was making him feel bad so he decreased it back to 1 po tid, although he admits the middle of the day dose 1/2 the time.  He only was up on the increased dosage for a week and then he dropped down on it.  He had trouble describing how he felt "bad" but just stated that he had a "dull headache."  He doesn't describe nausea.  He does think that the higher dose definitely helped the tremor.  No falls since last visit.  He has been exercising daily.  He is still drooling.  He does not wish to pursue myobloc.  He went off lisinopril due to lightheadedness and he feels much better.  Asks me about melanoma risk with levodopa.  05/03/15 update:  The patient follows up today, accompanied by his family who supplements the history.  He is on carbidopa/levodopa 25/100, one tablet 3 times per day (7am/12pm/5:30pm).  Last visit, I felt that he was under dosed, but he did not want to increase the medication because when I did that in the past he thought that that caused headache, although I was not convinced that it was from the medication.  He had one fall since last visit; he was taking a 3 mile walk and about a mile from his home he fell in the ditch and hurt his foot, which is better now.  He has not had hallucinations.  No lightheadedness or near syncope.  He worries about SE of levodopa and worries about it causing glaucoma and melanoma (he read about those things).  Not long after our last visit he saw his orthopedic surgeon at [facility] and he told the patient that he felt that he had a right L5 radiculopathy and he was told to come to me for treatment.  It was recommended to him that he try Neurontin or Lyrica, but he was also told that I prescribed the medication.  I called  the patient to discuss this, as I had no knowledge of his symptoms and he told me he wanted to wait until this visit discussing it, even though this visit was several months from his visit from his orthopedic surgeon.   Pt states that he is no longer having back or radicular sx's.  Does again c/o sialorrhea.  09/30/15 update:  The patient follows up today, accompanied by his wife who supplements the history.  He was on carbidopa/levodopa 25/100, one tablet 3 times per day but increased that to 2/1/2 which is how it was prescribed a long time ago but he did it shortly.  The last few visits, I felt that he was under dosed, but he did not want to increase the medication because when I did that in the past he thought that that caused headache, although I was not convinced that it was from the medication.  He has not had any falls since our last visit.  He has not had hallucinations.  No lightheadedness or near syncope.  "I'm still drooling like crazy."  Still doesn't want to do the botox.    04/16/16 update:  The patient follows up today.    He is on carbidopa/levodopa 25/100, 2 tablets in the morning, one in the afternoon and 2 in the evening.  Pt denies falls. Is having little more tremor.  Noting lip movements.   He is walking 3 miles a day.   Pt denies lightheadedness, near syncope.  No hallucinations.  Mood has been good.  Still noting drooling  09/11/16 update:  Patient seen today in follow-up.  He is on carbidopa/levodopa 25/100, 2 tablets in the morning, one in the afternoon and 2 in the evening.  Pt denies falls.  Has to sit to put on pants now.   Pt has some lightheadedness, but no syncope.  He is drinking 6-8 glasses water per day.  Noting low blood pressure at home.   No hallucinations.  Mood has been good.  States that his stamina is decreasing.  Thinks it is related to low blood pressure.  Had ingrown toenail removed yesterday.  02/12/17 update: Patient seen in follow-up today.  Patient remains on  carbidopa/levodopa 25/100, 2 tablets in the morning, 1 in the afternoon during the evening.   He is shuffling more ; "its like I lost the strength in my legs."  2 days ago he went for a 4 mile walk. He generally goes for a mile walk a day.   Pt denies falls.  Pt denies lightheadedness (much improved), near syncope.  No hallucinations.  Mood has been good.  08/08/17 update; patient is seen today in follow-up.  He is on carbidopa/levodopa 25/100, 2 tablets in the morning, 1 in the afternoon and 2 in the evening.  Had a few falls but cannot remember the nature of them but does tell me "I've been cutting wood and its hard not to trip."  He walks for exercise, but does not wish to participate in community Parkinson's exercise programs.  He has had no hallucinations.  No lightheadedness or near syncope.  I have reviewed records since our last visit.  He saw Dr. Etter Sjogren not long after our last visit in November.  He does report decreased appetite to me and states he has been losing weight (not verified through last several months of medical records).  01/15/18 update: Patient is seen today in follow-up for Parkinson's disease.  He had an MRI of the brain since our last visit, which was done Aug 30, 2017.  This was mostly due to dragging of the left leg, although I thought mostly this was orthopedic.  I want to make sure we were not missing anything.  The MRI of the brain intraparenchymally looked very good.  There is mild to moderate left ethmoid sinus disease.  He is supposed to be on carbidopa/levodopa  25/100, 2 tablets in the morning, 1 in the afternoon and 2 in the evening but he increased to 2 po tid.  "I don't shake but I rock."  He cannot tell me what the symptoms were that he was hoping to help, but he does say that legs are weak.  He is drooling more.  He has had no lightheadedness or near syncope since our last visit.  No hallucinations.  Just recently saw dermatology and had places frozen off of his scalp.     PREVIOUS MEDICATIONS: none to date  ALLERGIES:   Allergies  Allergen Reactions  . Garlic Nausea Only  . Niacin     REACTION: flushing    CURRENT MEDICATIONS:  Current Outpatient Medications on File Prior to Visit  Medication Sig Dispense Refill  . carbidopa-levodopa (SINEMET IR) 25-100 MG tablet 2 in AM, 1 in afternoon, 2 in PM (Patient taking differently: Take 2 tablets by mouth 3 (three) times daily. ) 450 tablet 3  . Cholecalciferol 2000 UNITS CAPS Take 1 capsule by mouth daily.     Marland Kitchen docusate sodium (COLACE) 100 MG capsule Take 200 mg by mouth daily as needed for mild constipation.    . dorzolamide-timolol (COSOPT) 22.3-6.8 MG/ML ophthalmic solution Place 1 drop into both eyes 2 (two) times daily.     Marland Kitchen latanoprost (XALATAN) 0.005 % ophthalmic solution Place 1 drop into both eyes at bedtime.     Marland Kitchen levothyroxine (SYNTHROID, LEVOTHROID) 125 MCG tablet TAKE 1 TABLET BY MOUTH ONCE DAILY 30 tablet 2  . polyethylene glycol (MIRALAX / GLYCOLAX) packet Take 17 g by mouth daily as needed.     . simvastatin (ZOCOR) 20 MG tablet TAKE 1 TABLET BY MOUTH IN THE EVENING 90 tablet 0   No current facility-administered medications on file prior to visit.     PAST MEDICAL HISTORY:   Past Medical History:  Diagnosis Date  . Anemia 1952   post Oromycin for [place]mia  . Anisocoria    post op  . Arthritis   . GERD (gastroesophageal reflux disease)   . Glaucoma    Dr Bing Plume  . Hearing loss in right ear   . Hypercholesteremia   . Hyperthyroidism    s/p RAI  . Parkinson's disease ([place])     PAST SURGICAL HISTORY:   Past Surgical History:  Procedure Laterality Date  . CARDIAC CATHETERIZATION  07/2006   Dr.  Eustace Quail, negative  . CATARACT EXTRACTION, BILATERAL     OS retinal surgery for post op floaters  . COLONOSCOPY     negative X 3  . RAI ablation  02/2008   hyperthyroidism  . SEPTOPLASTY  1970  . VASECTOMY      SOCIAL HISTORY:   Social History   Socioeconomic History  .  Marital status: Widowed    Spouse name: Not on file  . Number of children: Not on file  . Years of education: Not on file  . Highest education level: Not on file  Occupational History  . Occupation: Retired    Comment: Dentist Needs  . Financial resource strain: Not on file  . Food insecurity:    Worry: Not on file    Inability: Not on file  . Transportation needs:    Medical: Not on file    Non-medical: Not on file  Tobacco Use  . Smoking status: Former Smoker    Last attempt to quit: 04/02/1968    Years since quitting: 49.8  . Smokeless tobacco:  Never Used  Substance and Sexual Activity  . Alcohol use: Yes    Alcohol/week: 6.0 standard drinks    Types: 6 Cans of beer per week  . Drug use: No  . Sexual activity: Not on file  Lifestyle  . Physical activity:    Days per week: Not on file    Minutes per session: Not on file  . Stress: Not on file  Relationships  . Social connections:    Talks on phone: Not on file    Gets together: Not on file    Attends religious service: Not on file    Active member of club or organization: Not on file    Attends meetings of clubs or organizations: Not on file    Relationship status: Not on file  . Intimate partner violence:    Fear of current or ex partner: Not on file    Emotionally abused: Not on file    Physically abused: Not on file    Forced sexual activity: Not on file  Other Topics Concern  . 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 Father  Deceased at age 44       natural causes  . Mother  Deceased       "old age"  . Brother  Deceased       2, train accident; ? [place]  . Brother  Alive  . Sister  Alive       3, degen arthritis (bedridden at 82 y/o); dementia  . Child  Alive       3, alive and well  . Sister  (Not Specified)  . Brother  (Not Specified)  . Brother  (Not Specified)  . Brother  (Not Specified)  . MGM   (Not Specified)  . Brother  (Not Specified)  . MGF  (Not Specified)  . Neg Hx  (Not Specified)    CWC:BJSEGB of Systems  Constitutional: Negative.   HENT: Negative.   Eyes: Negative.   Respiratory: Negative.   Cardiovascular: Negative.   Skin: Negative.   Neurological: Positive for weakness.  Endo/Heme/Allergies: Negative.      PHYSICAL EXAMINATION:    VITALS:   Vitals:   01/15/18 0941  BP: 120/70  Pulse: 72  Weight: 178 lb (80.7 kg)  Height: 6\' 1"  (1.854 m)   Wt Readings from Last 3 Encounters:  01/15/18 178 lb (80.7 kg)  08/22/17 178 lb 9.6 oz (81 kg)  08/19/17 180 lb (81.6 kg)    GEN:  The patient appears stated age and is in NAD. HEENT:  Normocephalic, atraumatic.  The mucous membranes are moist. The superficial temporal arteries are without ropiness or tenderness. CV:  RRR Lungs:  CTAB Neck/HEME:  There are no carotid bruits bilaterally.  Neurological examination:  Orientation: The patient is alert and oriented x3. Cranial nerves: There is good facial symmetry. The speech is fluent and clear. Soft palate rises symmetrically and there is no tongue deviation. Hearing is intact to conversational tone. Sensation: Sensation is intact to light touch throughout Motor: Strength is 5/5 in the bilateral upper and lower extremities.   Shoulder shrug is equal and symmetric.  There is no pronator drift.  Movement examination: Tone: There is good tone today in the UE Abnormal movements: There is mild left upper extremity resting tremor.  There is truncal dyskinesia.   Coordination:  There is no  decremation with rapid alternating movements today  Gait and Station: The patient pushes off of the chair to arise.  He does drag the left leg, but he also has an antalgic gait.    Lab Results  Component Value Date   TSH 1.04 10/22/2017     Chemistry      Component Value Date/Time   NA 137 08/22/2017 1104   K 5.0 08/22/2017 1104   CL 101 08/22/2017 1104   CO2 33 (H)  08/22/2017 1104   BUN 18 08/22/2017 1104   CREATININE 1.17 08/22/2017 1104      Component Value Date/Time   CALCIUM 9.2 08/22/2017 1104   ALKPHOS 39 08/22/2017 1104   AST 18 08/22/2017 1104   ALT 3 08/22/2017 1104   BILITOT 0.7 08/22/2017 1104       ASSESSMENT/PLAN:  1.  Parkinsons disease, Hoehn and Yahr stage 2.5-3  -He increased his carbidopa/levodopa 25/100, 2/2/2 so will continue that.  Told him not to increase it any further without letting me know, as that would just make dyskinesia worse.  -He is having mild dyskinesia but he doesn't want more mediction  -he is dragging the L leg again.  It may be part of PD, but there appears to be orthopedic component because there is antalgic quality.  He needs to use a walker but he is resistant  -agreeable to home PT.  Doesn't want to go out of the home for therapy.  -talked about stationary biking.  He is now using a regular bike and I don't want him to use road bike 2.  constipation, associated with Parkinson's disease  -Using generic form of miralax now (clearlax now) 3.  Hypertension  -he is back off of lisinopril.  Has binder but not using. 4.  Sialorrhea  -This is commonly associated with PD.  We talked about treatments.  The patient is not a candidate for oral anticholinergic therapy because of increased risk of confusion and falls.  We discussed Botox (type A and B) and 1% atropine drops.  We discusssed that candy like lemon drops can help by stimulating mm of the oropharynx to induce swallowing.  He does not wish to pursue botox.  Will let me know if he changes his mind. 5. Follow up is anticipated in the next few months, sooner should new neurologic issues arise.  Much greater than 50% of this visit was spent in counseling and coordinating care.  Total face to face time:  25 min

## 2018-01-15 ENCOUNTER — Ambulatory Visit: Payer: PPO | Admitting: Neurology

## 2018-01-15 ENCOUNTER — Encounter: Payer: Self-pay | Admitting: Neurology

## 2018-01-15 VITALS — BP 120/70 | HR 72 | Ht 73.0 in | Wt 178.0 lb

## 2018-01-15 DIAGNOSIS — G20 Parkinson's disease: Secondary | ICD-10-CM

## 2018-01-15 DIAGNOSIS — K117 Disturbances of salivary secretion: Secondary | ICD-10-CM | POA: Diagnosis not present

## 2018-01-15 MED ORDER — CARBIDOPA-LEVODOPA 25-100 MG PO TABS: 2.0000 | ORAL_TABLET | Freq: Three times a day (TID) | ORAL | 1 refills | Status: DC

## 2018-01-19 ENCOUNTER — Encounter: Payer: Self-pay | Admitting: Physician Assistant

## 2018-01-19 NOTE — Progress Notes (Signed)
Cardiology Office Note    Date:  01/20/2018  ID:  William Williamson, DOB November 25, 1932, MRN [id number] PCP:  Carollee Herter, Alferd Apa, DO  Cardiologist:  Ena Dawley, MD   Chief Complaint: overdue f/u of orthostatic hypotension (L/S 2017)  History of Present Illness:  William Williamson is a 84 y.o. male with history of hypertension further complicated by hypotension/automic dysfunction in setting of Parkinson's disease, hyperlipidemia, hearing loss, mild AI, LVH, anemia following treatment for tularemia in 8th grade, GERD, hyperthyroidism s/p RAI who presents for overdue follow-up. Previously seen in clinic for orthostasis requiring med adjustment. He previously had hypotension with ramipril and lisinopril. He had been advised to use midodrine PRN. Compression stockings helped. Prior echo 01/2014 EF 60-65%, moderate focal basal septal hypertrophy, aortic valve sclerosis, dilated sinotubular junction measuring 4.2 cm, mild central AI, posterior MAC, mild MR. Last labs: 09/2017 TSH wnl, 07/2017 LDL 64 (PCP), K 5.0 (hyperkalemic at varying times in the past), Cr 1.17, LFTs OK, Hgb 14.4.  He returns for overdue follow-up doing well from a cardiac standpoint. He has not required any midodrine or compression stockings in recent time. He denies any major complaints with orthostasis, dizziness, syncope. He denies CP. He has chronic mild DOE which he feels is within proportion to his age and level of activity. He does not feel this is a specific new concern.    Past Medical History:  Diagnosis Date  . Anemia 1952   post Oromycin for Tularemia  . Anisocoria    post op  . Arthritis   . Autonomic dysfunction   . GERD (gastroesophageal reflux disease)   . Glaucoma    Dr Bing Plume  . Hearing loss in right ear   . Hypercholesteremia   . Hyperthyroidism    s/p RAI  . LVH (left ventricular hypertrophy)   . Orthostatic hypotension   . Parkinson's disease [facility])     Past Surgical History:  Procedure  Laterality Date  . CARDIAC CATHETERIZATION  07/2006   Dr.  Eustace Quail, negative  . CATARACT EXTRACTION, BILATERAL     OS retinal surgery for post op floaters  . COLONOSCOPY     negative X 3  . RAI ablation  02/2008   hyperthyroidism  . SEPTOPLASTY  1970  . VASECTOMY      Current Medications: Current Meds  Medication Sig  . carbidopa-levodopa (SINEMET IR) 25-100 MG tablet Take 2 tablets by mouth 3 (three) times daily.  . Cholecalciferol 2000 UNITS CAPS Take 1 capsule by mouth daily.   Marland Kitchen docusate sodium (COLACE) 100 MG capsule Take 200 mg by mouth daily as needed for mild constipation.  . dorzolamide-timolol (COSOPT) 22.3-6.8 MG/ML ophthalmic solution Place 1 drop into both eyes 2 (two) times daily.   Marland Kitchen latanoprost (XALATAN) 0.005 % ophthalmic solution Place 1 drop into both eyes at bedtime.   Marland Kitchen levothyroxine (SYNTHROID, LEVOTHROID) 125 MCG tablet TAKE 1 TABLET BY MOUTH ONCE DAILY  . polyethylene glycol (MIRALAX / GLYCOLAX) packet Take 17 g by mouth daily as needed.   . simvastatin (ZOCOR) 20 MG tablet TAKE 1 TABLET BY MOUTH IN THE EVENING      Allergies:   Garlic and Niacin   Social History   Socioeconomic History  . Marital status: Widowed    Spouse name: Not on file  . Number of children: Not on file  . Years of education: Not on file  . Highest education level: Not on file  Occupational History  . Occupation: Retired  Comment: personnel  Social Needs  . Financial resource strain: Not on file  . Food insecurity:    Worry: Not on file    Inability: Not on file  . Transportation needs:    Medical: Not on file    Non-medical: Not on file  Tobacco Use  . Smoking status: Former Smoker    Last attempt to quit: 04/02/1968    Years since quitting: 49.8  . Smokeless tobacco: Never Used  Substance and Sexual Activity  . Alcohol use: Yes    Alcohol/week: 6.0 standard drinks    Types: 6 Cans of beer per week  . Drug use: No  . Sexual activity: Not on file  Lifestyle    . Physical activity:    Days per week: Not on file    Minutes per session: Not on file  . Stress: Not on file  Relationships  . Social connections:    Talks on phone: Not on file    Gets together: Not on file    Attends religious service: Not on file    Active member of club or organization: Not on file    Attends meetings of clubs or organizations: Not on file    Relationship status: Not on file  Other Topics Concern  . 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 There is no history of Stroke.  ROS:   Please see the history of present illness. He does report some occasional trouble getting food stuck and having to wash it down/cough to get it to migrate downward. Also describes some generalized L leg weakness which has been outlined in prior neuro exams. All other systems are reviewed and otherwise negative.    PHYSICAL EXAM:   VS:  BP 120/70   Pulse 64   Ht _0  (1.854 m)   Wt 177 lb 12.8 oz (80.6 kg)   SpO2 97%   BMI 23.46 kg/m   BMI: Body mass index is 23.46 kg/m. GEN: Well nourished, well developed tall WM, in no acute distress HEENT: normocephalic, atraumatic Neck: no JVD, carotid bruits, or masses Cardiac: RRR; no murmurs, rubs, or gallops, no edema  Respiratory:  clear to auscultation bilaterally, normal work of breathing GI: soft, nontender, nondistended, + BS MS: no deformity or atrophy Skin: warm and dry, no rash Neuro:  Alert and Oriented x 3, Strength and sensation are intact, follows commands, mild LUE tremor, mild tendency to rock back and forth Psych: relatively flat affect   Wt Readings from Last 3 Encounters:   01/20/18 177 lb 12.8 oz (80.6 kg)  01/15/18 178 lb (80.7 kg)  08/22/17 178 lb 9.6 oz (81 kg)      Studies/Labs Reviewed:   EKG:  EKG was ordered today and personally reviewed by me and demonstrates sinus bradycardia 56bpm, no acute changes otherwise  Recent Labs: 08/22/2017: ALT 3; BUN 18; Creatinine, Ser 1.17; Hemoglobin 14.4; Platelets 200.0; Potassium 5.0; Sodium 137 10/22/2017: TSH 1.04   Lipid Panel    Component Value Date/Time   CHOL 116 08/22/2017 1104   TRIG 64.0 08/22/2017 1104   TRIG 79 01/23/2006 1303   HDL 39.10 08/22/2017 1104   CHOLHDL  3 08/22/2017 1104   VLDL 12.8 08/22/2017 1104   LDLCALC 64 08/22/2017 1104    Additional studies/ records that were reviewed today include: Summarized above.   ASSESSMENT & PLAN:   1. Orthostatic hypotension - quiescent. Suspect sequelae of PD. Continue to monitor. Can re-introduce therapies as previously outlined if this re-emerges. 2. Hypertension - controlled, off medication. 3. Left ventricular hypertrophy - continue surveillance of BP. 4. Hyperlipidemia - followed by PCP. 5. Mild aortic insufficiency - we discussed guidelines of surveillance echocardiogram. every 3-5 years. He would be due for 5 year f/u in 2020. Will have him f/u with Dr. Meda Coffee next year to decide whether to update study. He does not have any significant murmurs on exam and does not have any new or concerning cardiac complaints.  Disposition: F/u with Dr. Meda Coffee in  1 year.   Medication Adjustments/Labs and Tests Ordered: Current medicines are reviewed at length with the patient today.  Concerns regarding medicines are outlined above. Medication changes, Labs and Tests ordered today are summarized above and listed in the Patient Instructions accessible in Encounters.   Signed, Charlie Pitter, PA-C  01/20/2018 9:21 AM    [address], [place], [place]  [address] Phone: [phone]; Fax: [phone]

## 2018-01-20 ENCOUNTER — Encounter: Payer: Self-pay | Admitting: Physician Assistant

## 2018-01-20 ENCOUNTER — Ambulatory Visit: Payer: PPO | Admitting: Physician Assistant

## 2018-01-20 VITALS — BP 120/70 | HR 64 | Ht 73.0 in | Wt 177.8 lb

## 2018-01-20 DIAGNOSIS — I517 Cardiomegaly: Secondary | ICD-10-CM | POA: Diagnosis not present

## 2018-01-20 DIAGNOSIS — I951 Orthostatic hypotension: Secondary | ICD-10-CM

## 2018-01-20 DIAGNOSIS — I10 Essential (primary) hypertension: Secondary | ICD-10-CM | POA: Diagnosis not present

## 2018-01-20 DIAGNOSIS — E785 Hyperlipidemia, unspecified: Secondary | ICD-10-CM

## 2018-01-20 DIAGNOSIS — I351 Nonrheumatic aortic (valve) insufficiency: Secondary | ICD-10-CM | POA: Diagnosis not present

## 2018-01-20 NOTE — Patient Instructions (Signed)
Medication Instructions:  Your physician recommends that you continue on your current medications as directed. Please refer to the Current Medication list given to you today.  If you need a refill on your cardiac medications before your next appointment, please call your pharmacy.   Lab work: None ordered  If you have labs (blood work) drawn today and your tests are completely normal, you will receive your results only by: Marland Kitchen MyChart Message (if you have MyChart) OR . A paper copy in the mail If you have any lab test that is abnormal or we need to change your treatment, we will call you to review the results.  Testing/Procedures: None ordered   Follow-Up: At [facility], you and your health needs are our priority.  As part of our continuing mission to provide you with exceptional heart care, we have created designated Provider Care Teams.  These Care Teams include your primary Cardiologist (physician) and Advanced Practice Providers (APPs -  Physician Assistants and Nurse Practitioners) who all work together to provide you with the care you need, when you need it. You will need a follow up appointment in 1 years.  Please call our office 2 months in advance to schedule this appointment.  You may see Ena Dawley, MD or one of the following Advanced Practice Providers on your designated Care Team:   Pierce, PA-C Melina Copa, PA-C . Ermalinda Barrios, PA-C  Any Other Special Instructions Will Be Listed Below (If Applicable).

## 2018-02-13 ENCOUNTER — Other Ambulatory Visit: Payer: Self-pay | Admitting: Family Medicine

## 2018-02-13 ENCOUNTER — Telehealth: Payer: Self-pay | Admitting: Neurology

## 2018-02-13 NOTE — Telephone Encounter (Signed)
Patient wants to know if we will extend his handicap sticker, please call and let him know

## 2018-02-13 NOTE — Telephone Encounter (Signed)
Patient aware we can complete. Will leave at the front for patient pick up.

## 2018-02-20 ENCOUNTER — Other Ambulatory Visit (HOSPITAL_COMMUNITY): Payer: Self-pay | Admitting: *Deleted

## 2018-02-20 ENCOUNTER — Encounter: Payer: Self-pay | Admitting: Family Medicine

## 2018-02-20 ENCOUNTER — Ambulatory Visit (INDEPENDENT_AMBULATORY_CARE_PROVIDER_SITE_OTHER): Payer: PPO | Admitting: Family Medicine

## 2018-02-20 VITALS — BP 133/69 | HR 63 | Temp 97.8°F | Resp 16 | Ht 73.0 in | Wt 178.8 lb

## 2018-02-20 DIAGNOSIS — R1310 Dysphagia, unspecified: Secondary | ICD-10-CM

## 2018-02-20 DIAGNOSIS — E039 Hypothyroidism, unspecified: Secondary | ICD-10-CM

## 2018-02-20 DIAGNOSIS — G20 Parkinson's disease: Secondary | ICD-10-CM

## 2018-02-20 DIAGNOSIS — E785 Hyperlipidemia, unspecified: Secondary | ICD-10-CM | POA: Diagnosis not present

## 2018-02-20 LAB — LIPID PANEL
Cholesterol: 129 mg/dL (ref 0–200)
HDL: 40.3 mg/dL (ref >39.00)
LDL Cholesterol: 73 mg/dL (ref 0–99)
NonHDL: 89.08
Total CHOL/HDL Ratio: 3
Triglycerides: 78 mg/dL (ref 0.0–149.0)
VLDL: 15.6 mg/dL (ref 0.0–40.0)

## 2018-02-20 LAB — COMPREHENSIVE METABOLIC PANEL
ALBUMIN: 4.4 g/dL (ref 3.5–5.2)
ALK PHOS: 50 U/L (ref 39–117)
ALT: 4 U/L (ref 0–53)
AST: 17 U/L (ref 0–37)
BILIRUBIN TOTAL: 0.7 mg/dL (ref 0.2–1.2)
BUN: 17 mg/dL (ref 6–23)
CO2: 31 mEq/L (ref 19–32)
Calcium: 9.5 mg/dL (ref 8.4–10.5)
Chloride: 103 mEq/L (ref 96–112)
Creatinine, Ser: 1.06 mg/dL (ref 0.40–1.50)
GFR: 70.93 mL/min (ref >60.00)
Glucose, Bld: 92 mg/dL (ref 70–99)
POTASSIUM: 5.2 meq/L — AB (ref 3.5–5.1)
SODIUM: 139 meq/L (ref 135–145)
TOTAL PROTEIN: 6.9 g/dL (ref 6.0–8.3)

## 2018-02-20 LAB — TSH: TSH: 0.69 u[IU]/mL (ref 0.35–4.50)

## 2018-02-20 MED ORDER — SIMVASTATIN 20 MG PO TABS: 20.0000 mg | ORAL_TABLET | Freq: Every evening | ORAL | 1 refills | Status: DC

## 2018-02-20 MED ORDER — LEVOTHYROXINE SODIUM 125 MCG PO TABS: 125.0000 ug | ORAL_TABLET | Freq: Every day | ORAL | 1 refills | Status: DC

## 2018-02-20 NOTE — Patient Instructions (Signed)

## 2018-02-20 NOTE — Assessment & Plan Note (Signed)
Per neuro Swallowing trouble Check barium swallow

## 2018-02-20 NOTE — Progress Notes (Signed)
Patient ID: William Williamson, male    DOB: [birth year]  Age: 84 y.o. MRN: [id number]   f/ Subjective:  Subjective  HPI William Williamson presents for f/u.  He c/o trouble swallowing and is afraid of cholking No other complaints.   Review of Systems  Constitutional: Negative for appetite change, diaphoresis, fatigue and unexpected weight change.  Eyes: Negative for pain, redness and visual disturbance.  Respiratory: Negative for cough, chest tightness, shortness of breath and wheezing.   Cardiovascular: Negative for chest pain, palpitations and leg swelling.  Endocrine: Negative for cold intolerance, heat intolerance, polydipsia, polyphagia and polyuria.  Genitourinary: Negative for difficulty urinating, dysuria and frequency.  Musculoskeletal: Positive for gait problem.  Neurological: Positive for tremors and weakness. Negative for dizziness, light-headedness, numbness and headaches.    History Past Medical History:  Diagnosis Date  . Anemia 1952   post Oromycin for Tularemia  . Anisocoria    post op  . Arthritis   . Autonomic dysfunction   . GERD (gastroesophageal reflux disease)   . Glaucoma    Dr Bing Plume  . Hearing loss in right ear   . Hypercholesteremia   . Hyperthyroidism    s/p RAI  . LVH (left ventricular hypertrophy)   . Orthostatic hypotension   . Parkinson's disease [facility])     He has a past surgical history that includes Septoplasty (1970); Cardiac catheterization (07/2006); Vasectomy; RAI ablation (02/2008); Cataract extraction, bilateral; and Colonoscopy.   His family history includes Diabetes in his brother; Heart attack (age of onset: 26) in his maternal grandfather; Hypertension in his brother and sister; Osteoporosis in his mother; Pancreatic cancer in his brother; Prostate cancer in his brother; Stomach cancer in his maternal grandmother.He reports that he quit smoking about 49 years ago. He has never used smokeless tobacco. He reports that he drinks about  6.0 standard drinks of alcohol per week. He reports that he does not use drugs.  Current Outpatient Medications on File Prior to Visit  Medication Sig Dispense Refill  . carbidopa-levodopa (SINEMET IR) 25-100 MG tablet Take 2 tablets by mouth 3 (three) times daily. 540 tablet 1  . Cholecalciferol 2000 UNITS CAPS Take 1 capsule by mouth daily.     Marland Kitchen docusate sodium (COLACE) 100 MG capsule Take 200 mg by mouth daily as needed for mild constipation.    . dorzolamide-timolol (COSOPT) 22.3-6.8 MG/ML ophthalmic solution Place 1 drop into both eyes 2 (two) times daily.     Marland Kitchen latanoprost (XALATAN) 0.005 % ophthalmic solution Place 1 drop into both eyes at bedtime.     . polyethylene glycol (MIRALAX / GLYCOLAX) packet Take 17 g by mouth daily as needed.      No current facility-administered medications on file prior to visit.      Objective:  Objective  Physical Exam  Constitutional: He is oriented to person, place, and time. Vital signs are normal. He appears well-developed and well-nourished. He is sleeping.  HENT:  Head: Normocephalic and atraumatic.  Mouth/Throat: Oropharynx is clear and moist.  Eyes: Pupils are equal, round, and reactive to light. EOM are normal.  Neck: Normal range of motion. Neck supple. No thyromegaly present.  Cardiovascular: Normal rate and regular rhythm.  No murmur heard. Pulmonary/Chest: Effort normal and breath sounds normal. No respiratory distress. He has no wheezes. He has no rales. He exhibits no tenderness.  Musculoskeletal: He exhibits no edema or tenderness.  Neurological: He is alert and oriented to person, place, and time. Gait abnormal.  Tremor  Skin: Skin is warm and dry.  Psychiatric: He has a normal mood and affect. His behavior is normal. Judgment and thought content normal.  Nursing note and vitals reviewed.  BP 133/69 (BP Location: Right Arm, Cuff Size: Normal)   Pulse 63   Temp 97.8 F (36.6 C) (Oral)   Resp 16   Ht 6\' 1"  (1.854 m)   Wt  178 lb 12.8 oz (81.1 kg)   SpO2 96%   BMI 23.59 kg/m  Wt Readings from Last 3 Encounters:  02/20/18 178 lb 12.8 oz (81.1 kg)  01/20/18 177 lb 12.8 oz (80.6 kg)  01/15/18 178 lb (80.7 kg)     Lab Results  Component Value Date   WBC 7.7 08/22/2017   HGB 14.4 08/22/2017   HCT 42.6 08/22/2017   PLT 200.0 08/22/2017   GLUCOSE 92 02/20/2018   CHOL 129 02/20/2018   TRIG 78.0 02/20/2018   HDL 40.30 02/20/2018   LDLCALC 73 02/20/2018   ALT 4 02/20/2018   AST 17 02/20/2018   NA 139 02/20/2018   K 5.2 (H) 02/20/2018   CL 103 02/20/2018   CREATININE 1.06 02/20/2018   BUN 17 02/20/2018   CO2 31 02/20/2018   TSH 0.69 02/20/2018   PSA 4.85 (H) 08/16/2016   INR 0.9 RATIO 07/26/2006   HGBA1C 5.9 10/09/2006    Mr Brain Wo Contrast  Result Date: 08/30/2017 CLINICAL DATA:  84 year old male with left leg weakness and difficulty walking for roughly a year. EXAM: MRI HEAD WITHOUT CONTRAST TECHNIQUE: Multiplanar, multiecho pulse sequences of the brain and surrounding structures were obtained without intravenous contrast. COMPARISON:  Brain MRI 02/13/2013.  Face CT 01/19/2013. FINDINGS: Brain: Stable cerebral volume since 2014. No restricted diffusion to suggest acute infarction. No midline shift, mass effect, evidence of mass lesion, ventriculomegaly, extra-axial collection or acute intracranial hemorrhage. Cervicomedullary junction and pituitary are within normal limits. Stable since 2014 and unremarkable for age gray and white matter signal throughout the brain. No chronic cerebral blood products or cortical encephalomalacia. Vascular: Major intracranial vascular flow voids are stable. Skull and upper cervical spine: Normal visible cervical spine. Normal bone marrow signal. Sinuses/Orbits: Stable and normal orbits soft tissues. Post cataract surgery changes. Increased paranasal sinus mucosal thickening in the left frontal and anterior ethmoids. Stable mild sinus disease elsewhere. Other: Visible  internal auditory structures appear normal. Mastoid air cells remain clear. Scalp and face soft tissues appear negative. IMPRESSION: 1. Stable since 2014 and largely unremarkable for age noncontrast MRI appearance of the brain. 2. Mild to moderate left frontoethmoidal paranasal sinus disease. Electronically Signed   By: Genevie Ann M.D.   On: 08/30/2017 09:12     Assessment & Plan:  Plan  I have changed Colten A. Egli's simvastatin and levothyroxine. I am also having him maintain his Cholecalciferol, dorzolamide-timolol, polyethylene glycol, latanoprost, docusate sodium, and carbidopa-levodopa.  Meds ordered this encounter  Medications  . simvastatin (ZOCOR) 20 MG tablet    Sig: Take 1 tablet (20 mg total) by mouth every evening.    Dispense:  90 tablet    Refill:  1  . levothyroxine (SYNTHROID, LEVOTHROID) 125 MCG tablet    Sig: Take 1 tablet (125 mcg total) by mouth daily.    Dispense:  90 tablet    Refill:  1    Please consider 90 day supplies to promote better adherence    Problem List Items Addressed This Visit      Unprioritized   Hypothyroidism - Primary   Relevant  Medications   levothyroxine (SYNTHROID, LEVOTHROID) 125 MCG tablet   Other Relevant Orders   TSH (Completed)   Parkinson disease ([place])    Per neuro Swallowing trouble Check barium swallow        Other Visit Diagnoses    Hyperlipidemia, unspecified hyperlipidemia type       Relevant Medications   simvastatin (ZOCOR) 20 MG tablet   Other Relevant Orders   Comprehensive metabolic panel (Completed)   Lipid panel (Completed)   Dysphagia, unspecified type       Relevant Orders   SLP modified barium swallow      Follow-up: Return in about 6 months (around 08/21/2018), or if symptoms worsen or fail to improve, for hyperlipidemia.  [name], DO

## 2018-03-04 ENCOUNTER — Other Ambulatory Visit (HOSPITAL_COMMUNITY): Payer: Self-pay | Admitting: *Deleted

## 2018-03-04 ENCOUNTER — Other Ambulatory Visit: Payer: Self-pay | Admitting: Family Medicine

## 2018-03-04 DIAGNOSIS — E875 Hyperkalemia: Secondary | ICD-10-CM

## 2018-03-04 DIAGNOSIS — R1310 Dysphagia, unspecified: Secondary | ICD-10-CM

## 2018-03-05 ENCOUNTER — Ambulatory Visit (HOSPITAL_COMMUNITY)
Admission: RE | Admit: 2018-03-05 | Discharge: 2018-03-05 | Disposition: A | Discharge status: Home / Self Care | Payer: PPO | Source: Ambulatory Visit | Attending: Family Medicine | Admitting: Family Medicine

## 2018-03-05 ENCOUNTER — Ambulatory Visit (HOSPITAL_COMMUNITY): Payer: PPO

## 2018-03-05 ENCOUNTER — Encounter (HOSPITAL_COMMUNITY): Payer: PPO

## 2018-03-05 ENCOUNTER — Other Ambulatory Visit (HOSPITAL_COMMUNITY): Payer: Self-pay | Admitting: Family Medicine

## 2018-03-05 DIAGNOSIS — R1310 Dysphagia, unspecified: Secondary | ICD-10-CM | POA: Diagnosis not present

## 2018-03-05 DIAGNOSIS — G20 Parkinson's disease: Secondary | ICD-10-CM | POA: Diagnosis not present

## 2018-03-20 NOTE — Addendum Note (Signed)
Addended by: Magdalene Molly A on: 03/20/2018 11:50 AM   Modules accepted: Orders

## 2018-04-07 ENCOUNTER — Other Ambulatory Visit (INDEPENDENT_AMBULATORY_CARE_PROVIDER_SITE_OTHER): Payer: PPO

## 2018-04-07 DIAGNOSIS — E875 Hyperkalemia: Secondary | ICD-10-CM

## 2018-04-07 LAB — BASIC METABOLIC PANEL
BUN: 16 mg/dL (ref 6–23)
CO2: 30 mEq/L (ref 19–32)
Calcium: 9.1 mg/dL (ref 8.4–10.5)
Chloride: 102 mEq/L (ref 96–112)
Creatinine, Ser: 1.15 mg/dL (ref 0.40–1.50)
GFR: 64.54 mL/min (ref >60.00)
GLUCOSE: 89 mg/dL (ref 70–99)
Potassium: 4.6 mEq/L (ref 3.5–5.1)
Sodium: 138 mEq/L (ref 135–145)

## 2018-05-12 ENCOUNTER — Encounter: Payer: Self-pay | Admitting: *Deleted

## 2018-06-06 ENCOUNTER — Other Ambulatory Visit: Payer: Self-pay | Admitting: Family Medicine

## 2018-06-06 DIAGNOSIS — E039 Hypothyroidism, unspecified: Secondary | ICD-10-CM

## 2018-06-20 ENCOUNTER — Ambulatory Visit: Payer: PPO | Admitting: Neurology

## 2018-07-27 ENCOUNTER — Other Ambulatory Visit: Payer: Self-pay | Admitting: Neurology

## 2018-08-26 ENCOUNTER — Encounter: Payer: Self-pay | Admitting: Family Medicine

## 2018-08-26 ENCOUNTER — Ambulatory Visit (INDEPENDENT_AMBULATORY_CARE_PROVIDER_SITE_OTHER): Payer: PPO | Admitting: Family Medicine

## 2018-08-26 ENCOUNTER — Other Ambulatory Visit: Payer: Self-pay

## 2018-08-26 VITALS — BP 147/89 | HR 60

## 2018-08-26 DIAGNOSIS — G20 Parkinson's disease: Secondary | ICD-10-CM | POA: Diagnosis not present

## 2018-08-26 DIAGNOSIS — E785 Hyperlipidemia, unspecified: Secondary | ICD-10-CM

## 2018-08-26 DIAGNOSIS — E039 Hypothyroidism, unspecified: Secondary | ICD-10-CM | POA: Diagnosis not present

## 2018-08-26 DIAGNOSIS — H9209 Otalgia, unspecified ear: Secondary | ICD-10-CM

## 2018-08-26 DIAGNOSIS — E890 Postprocedural hypothyroidism: Secondary | ICD-10-CM

## 2018-08-26 NOTE — Assessment & Plan Note (Signed)
Per neuro 

## 2018-08-26 NOTE — Assessment & Plan Note (Signed)
Pt will come if for in person ov to see ears and labs

## 2018-08-26 NOTE — Progress Notes (Signed)
Virtual Visit via Video Note  I connected with William Williamson on 08/26/18 at  9:15 AM EDT by a video enabled telemedicine application and verified that I am speaking with the correct person using two identifiers.  Location: Patient: home  Provider: office    I discussed the limitations of evaluation and management by telemedicine and the availability of in person appointments. The patient expressed understanding and agreed to proceed.  History of Present Illness: Pt is home alone-- his son is at the beach  He is c/o ear itching and pain -- x 3 months  No other complaints.     Observations/Objective: Vitals:   08/26/18 0912  BP: (!) 147/89  Pulse: 60   No fever  Pt is in NAD  Assessment and Plan: 1. Hypothyroidism, unspecified type Check labs con't meds  - TSH; Future  2. Hyperlipidemia, unspecified hyperlipidemia type Tolerating statin, encouraged heart healthy diet, avoid trans fats, minimize simple carbs and saturated fats. Increase exercise as tolerated - Lipid panel; Future - Comprehensive metabolic panel; Future  3. Hyperlipidemia LDL goal <100    4. Hypothyroidism following radioiodine therapy    5. Parkinson disease ([place]) Per neuro  6. Otalgia, unspecified laterality Pt did not want drops Pt will come in for in person ov so we can see his ears    Follow Up Instructions:    I discussed the assessment and treatment plan with the patient. The patient was provided an opportunity to ask questions and all were answered. The patient agreed with the plan and demonstrated an understanding of the instructions.   The patient was advised to call back or seek an in-person evaluation if the symptoms worsen or if the condition fails to improve as anticipated.  I provided 15 minutes of non-face-to-face time during this encounter.   [name], DO

## 2018-08-26 NOTE — Assessment & Plan Note (Signed)
Tolerating statin, encouraged heart healthy diet, avoid trans fats, minimize simple carbs and saturated fats. Increase exercise as tolerated 

## 2018-08-26 NOTE — Assessment & Plan Note (Signed)
Check tsh con't meds 

## 2018-08-27 DIAGNOSIS — H401131 Primary open-angle glaucoma, bilateral, mild stage: Secondary | ICD-10-CM | POA: Diagnosis not present

## 2018-08-28 ENCOUNTER — Encounter: Payer: PPO | Admitting: Family Medicine

## 2018-08-28 ENCOUNTER — Ambulatory Visit: Payer: PPO | Admitting: *Deleted

## 2018-09-03 ENCOUNTER — Other Ambulatory Visit (INDEPENDENT_AMBULATORY_CARE_PROVIDER_SITE_OTHER): Payer: PPO

## 2018-09-03 ENCOUNTER — Other Ambulatory Visit: Payer: Self-pay

## 2018-09-03 DIAGNOSIS — E785 Hyperlipidemia, unspecified: Secondary | ICD-10-CM | POA: Diagnosis not present

## 2018-09-03 DIAGNOSIS — E039 Hypothyroidism, unspecified: Secondary | ICD-10-CM

## 2018-09-03 LAB — COMPREHENSIVE METABOLIC PANEL
ALT: 2 U/L (ref 0–53)
AST: 15 U/L (ref 0–37)
Albumin: 4.2 g/dL (ref 3.5–5.2)
Alkaline Phosphatase: 44 U/L (ref 39–117)
BUN: 13 mg/dL (ref 6–23)
CO2: 30 mEq/L (ref 19–32)
Calcium: 9.2 mg/dL (ref 8.4–10.5)
Chloride: 100 mEq/L (ref 96–112)
Creatinine, Ser: 1.09 mg/dL (ref 0.40–1.50)
GFR: 64.54 mL/min (ref >60.00)
Glucose, Bld: 89 mg/dL (ref 70–99)
Potassium: 4.6 mEq/L (ref 3.5–5.1)
Sodium: 137 mEq/L (ref 135–145)
Total Bilirubin: 0.7 mg/dL (ref 0.2–1.2)
Total Protein: 6.7 g/dL (ref 6.0–8.3)

## 2018-09-03 LAB — LIPID PANEL
Cholesterol: 124 mg/dL (ref 0–200)
HDL: 40.5 mg/dL (ref >39.00)
LDL Cholesterol: 67 mg/dL (ref 0–99)
NonHDL: 83.69
Total CHOL/HDL Ratio: 3
Triglycerides: 84 mg/dL (ref 0.0–149.0)
VLDL: 16.8 mg/dL (ref 0.0–40.0)

## 2018-09-03 LAB — TSH: TSH: 0.28 u[IU]/mL — ABNORMAL LOW (ref 0.35–4.50)

## 2018-09-07 ENCOUNTER — Other Ambulatory Visit: Payer: Self-pay | Admitting: Family Medicine

## 2018-09-07 DIAGNOSIS — E039 Hypothyroidism, unspecified: Secondary | ICD-10-CM

## 2018-09-08 ENCOUNTER — Other Ambulatory Visit: Payer: Self-pay | Admitting: *Deleted

## 2018-09-08 ENCOUNTER — Ambulatory Visit: Payer: PPO | Admitting: Internal Medicine

## 2018-09-08 MED ORDER — LEVOTHYROXINE SODIUM 112 MCG PO TABS: 112.0000 ug | ORAL_TABLET | Freq: Every day | ORAL | 0 refills | Status: DC

## 2018-09-09 ENCOUNTER — Ambulatory Visit (INDEPENDENT_AMBULATORY_CARE_PROVIDER_SITE_OTHER): Payer: PPO | Admitting: Family Medicine

## 2018-09-09 ENCOUNTER — Other Ambulatory Visit: Payer: Self-pay

## 2018-09-09 ENCOUNTER — Encounter: Payer: Self-pay | Admitting: Family Medicine

## 2018-09-09 VITALS — BP 106/85 | HR 73 | Temp 98.5°F | Resp 12 | Ht 73.0 in | Wt 176.8 lb

## 2018-09-09 DIAGNOSIS — R600 Localized edema: Secondary | ICD-10-CM

## 2018-09-09 DIAGNOSIS — H9203 Otalgia, bilateral: Secondary | ICD-10-CM

## 2018-09-09 MED ORDER — ACETIC ACID 2 % OT SOLN: 4.0000 [drp] | OTIC | 0 refills | Status: DC

## 2018-09-09 MED ORDER — HYDROCHLOROTHIAZIDE 25 MG PO TABS: 25.0000 mg | ORAL_TABLET | Freq: Every day | ORAL | 3 refills | Status: DC

## 2018-09-09 NOTE — Progress Notes (Signed)
Patient ID: William Williamson, male    DOB: December 11, 1932  Age: 85 y.o. MRN: [id number]    Subjective:  Subjective  HPI William Williamson presents for c/o itchy ears -- he is requesting drops and he also c/o swelling in his L foot only.  He sits most of the days with his legs down.  No calf pain or sob.    Review of Systems  Constitutional: Negative for appetite change, diaphoresis, fatigue and unexpected weight change.  HENT: Positive for ear pain. Negative for congestion, ear discharge, sinus pressure, sinus pain, sneezing, sore throat, tinnitus, trouble swallowing and voice change.   Eyes: Negative for pain, redness and visual disturbance.  Respiratory: Negative for cough, chest tightness, shortness of breath and wheezing.   Cardiovascular: Positive for leg swelling. Negative for chest pain and palpitations.  Endocrine: Negative for cold intolerance, heat intolerance, polydipsia, polyphagia and polyuria.  Genitourinary: Negative for difficulty urinating, dysuria and frequency.  Neurological: Negative for dizziness, light-headedness, numbness and headaches.    History Past Medical History:  Diagnosis Date   Anemia 1952   post Oromycin for [place]mia   Anisocoria    post op   Arthritis    Autonomic dysfunction    GERD (gastroesophageal reflux disease)    Glaucoma    Dr Bing Plume   Hearing loss in right ear    Hypercholesteremia    Hyperthyroidism    s/p RAI   LVH (left ventricular hypertrophy)    Orthostatic hypotension    Parkinson's disease ([place])     He has a past surgical history that includes Septoplasty (1970); Cardiac catheterization (07/2006); Vasectomy; RAI ablation (02/2008); Cataract extraction, bilateral; and Colonoscopy.   His family history includes Diabetes in his brother; Heart attack (age of onset: 50) in his maternal grandfather; Hypertension in his brother and sister; Osteoporosis in his mother; Pancreatic cancer in his brother; Prostate cancer in  his brother; Stomach cancer in his maternal grandmother.He reports that he quit smoking about 50 years ago. He has never used smokeless tobacco. He reports current alcohol use of about 6.0 standard drinks of alcohol per week. He reports that he does not use drugs.  Current Outpatient Medications on File Prior to Visit  Medication Sig Dispense Refill   carbidopa-levodopa (SINEMET IR) 25-100 MG tablet TAKE 2 TABLETS BY MOUTH  THREE TIMES DAILY 540 tablet 0   Cholecalciferol 2000 UNITS CAPS Take 1 capsule by mouth daily.      docusate sodium (COLACE) 100 MG capsule Take 200 mg by mouth daily as needed for mild constipation.     dorzolamide-timolol (COSOPT) 22.3-6.8 MG/ML ophthalmic solution Place 1 drop into both eyes 2 (two) times daily.      latanoprost (XALATAN) 0.005 % ophthalmic solution Place 1 drop into both eyes at bedtime.      levothyroxine (SYNTHROID) 112 MCG tablet Take 1 tablet (112 mcg total) by mouth daily. 90 tablet 0   polyethylene glycol (MIRALAX / GLYCOLAX) packet Take 17 g by mouth daily as needed.      simvastatin (ZOCOR) 20 MG tablet Take 1 tablet (20 mg total) by mouth every evening. 90 tablet 1   No current facility-administered medications on file prior to visit.      Objective:  Objective  Physical Exam Vitals signs and nursing note reviewed.  Constitutional:      General: He is sleeping.     Appearance: He is well-developed.  HENT:     Head: Normocephalic and atraumatic.  Right Ear: Decreased hearing noted. No swelling or tenderness. No middle ear effusion. There is no impacted cerumen. No foreign body. Tympanic membrane is not injected or scarred.     Left Ear: Decreased hearing noted. No swelling or tenderness.  No middle ear effusion. There is no impacted cerumen. No foreign body. Tympanic membrane is not injected or scarred.     Ears:   Eyes:     Pupils: Pupils are equal, round, and reactive to light.  Neck:     Musculoskeletal: Normal range of  motion and neck supple.     Thyroid: No thyromegaly.  Cardiovascular:     Rate and Rhythm: Normal rate and regular rhythm.     Heart sounds: No murmur.  Pulmonary:     Effort: Pulmonary effort is normal. No respiratory distress.     Breath sounds: Normal breath sounds. No wheezing or rales.  Chest:     Chest wall: No tenderness.  Musculoskeletal:        General: No tenderness.     Left ankle: He exhibits swelling.     Left lower leg: He exhibits no tenderness and no swelling. No edema.     Left foot: Swelling present.  Skin:    General: Skin is warm and dry.  Neurological:     Mental Status: He is oriented to person, place, and time.  Psychiatric:        Behavior: Behavior normal.        Thought Content: Thought content normal.        Judgment: Judgment normal.    BP 106/85 (BP Location: Right Arm, Cuff Size: Normal)    Pulse 73    Temp 98.5 F (36.9 C) (Oral)    Resp 12    Ht 6\' 1"  (1.854 m)    Wt 176 lb 12.8 oz (80.2 kg)    SpO2 97%    BMI 23.33 kg/m  Wt Readings from Last 3 Encounters:  09/09/18 176 lb 12.8 oz (80.2 kg)  02/20/18 178 lb 12.8 oz (81.1 kg)  01/20/18 177 lb 12.8 oz (80.6 kg)     Lab Results  Component Value Date   WBC 7.7 08/22/2017   HGB 14.4 08/22/2017   HCT 42.6 08/22/2017   PLT 200.0 08/22/2017   GLUCOSE 89 09/03/2018   CHOL 124 09/03/2018   TRIG 84.0 09/03/2018   HDL 40.50 09/03/2018   LDLCALC 67 09/03/2018   ALT 2 09/03/2018   AST 15 09/03/2018   NA 137 09/03/2018   K 4.6 09/03/2018   CL 100 09/03/2018   CREATININE 1.09 09/03/2018   BUN 13 09/03/2018   CO2 30 09/03/2018   TSH 0.28 (L) 09/03/2018   PSA 4.85 (H) 08/16/2016   INR 0.9 RATIO 07/26/2006   HGBA1C 5.9 10/09/2006    Dg Op Swallowing Func-medicare/speech Path  Result Date: 03/05/2018 Objective Swallowing Evaluation: Type of Study: MBS-Modified Barium Swallow Study  Patient Details Name: William Williamson MRN: [id number] Date of Birth: 1933/02/17 Today's Date: 03/05/2018 Time:  SLP Start Time (ACUTE ONLY): 64 -SLP Stop Time (ACUTE ONLY): 1410 SLP Time Calculation (min) (ACUTE ONLY): 42 min Past Medical History: Past Medical History: Diagnosis Date  Anemia 1952  post Oromycin for [place]  Anisocoria   post op  Arthritis   Autonomic dysfunction   GERD (gastroesophageal reflux disease)   Glaucoma   Dr Bing Plume  Hearing loss in right ear   Hypercholesteremia   Hyperthyroidism   s/p RAI  LVH (left ventricular hypertrophy)  Orthostatic hypotension   Parkinson's disease [facility])  Past Surgical History: Past Surgical History: Procedure Laterality Date  CARDIAC CATHETERIZATION  07/2006  Dr.  Eustace Quail, negative  CATARACT EXTRACTION, BILATERAL    OS retinal surgery for post op floaters  COLONOSCOPY    negative X 3  RAI ablation  02/2008  hyperthyroidism  SEPTOPLASTY  1970  VASECTOMY   HPI: 85 yo gentleman referred for MBS by Dr Etter Sjogren due to pt report of coughing with intake - He reports most difficulties with solids requiring him to cough and at times expectorate solids mixed with secretions.  He states this mostly occurs immediately after eating.  Reports difficulties with nuts, oranges, etc.  Pt denies weight loss, pneumonias nor requiring heimlich manuever.  He has not had prior swallow studies.  H/o GERD but pt states this "went away".  Pt also reports he frequently drools.   Subjective: pt awake sitting upright in chair, notable excessive upper body movement due to his Parkinsons Assessment / Plan / Recommendation CHL IP CLINICAL IMPRESSIONS 03/05/2018 Clinical Impression Patient presents with mild oral and moderate pharyngeal dysphagia with sensorimotor deficits.  Impaired pharyngeal contraction, tongue base retraction and laryngeal elevation results in residuals across all consistencies.  Various postures including head turn left *to pt's purported weak side* with and without chin tuck with thin and nectar did not prevent accumulation of barium and resulted in trace  laryngeal penetration of thin as barium spilled from pyriform into open larynx.  He did NOT aspirate and trace penetrates cleared with further swallow.  Pt does not always sense residuals and pharyngeal residuals were not worse with solids compared to liquids.   SLP taxed pt in effort to try to get him to aspirate with sequential boluses of thin (7) but he did not.  Dry swallows *reflexive and cued* help to decrease residuals.   Cued "hock" helpful for pt to expel vallecular residuals and advised pt to use this strategy during meals to assure vallecular clearance.  Pt able to transit and swallow barium tablet with thin without difficulty.  Upon esophageal sweep, esophagus appeared clear but this test is not intended to evaluate esophageal function.  Advised pt to start meals with liquids *prefer water*, conduct multiple dry swallows per bolus, masticate thoroughly, conduct effortful swallow, follow solids with liquids, intermittent "hock".  Also provided him with lingual press and effortful swallow exercise to facilitate pharyngeal contraction.  Advised he use caution with foods that cause difficulties like oranges, nuts - and he may need to avoid these due to increased pulmonary risk if aspirating solids.  Please consider referral for OP for pt to undergo Respiratory Muscle Strength Training *RMST* to facilitate laryngeal closure, voice and cough strength for maximal swallow/voice benefits.  Using teach back and written instructions pt educated.  Thanks for this referral.   SLP Visit Diagnosis Dysphagia, oropharyngeal phase (R13.12);Dysphagia, pharyngoesophageal phase (R13.14) Attention and concentration deficit following -- Frontal lobe and executive function deficit following -- Impact on safety and function Moderate aspiration risk   No flowsheet data found.  No flowsheet data found. CHL IP DIET RECOMMENDATION 03/05/2018 SLP Diet Recommendations Regular solids;Thin liquid Liquid Administration via Cup;Straw  Medication Administration Other ( as tolerated, with pudding if problematic Compensations Slow rate;Small sips/bites;Multiple dry swallows after each bite/sip;Follow solids with liquid;Effortful swallow, intermittent "hock" Postural Changes Remain semi-upright after after feeds/meals (Comment);Seated upright at 90 degrees   CHL IP OTHER RECOMMENDATIONS 03/05/2018 Recommended Consults OP therapy for feeding Oral Care Recommendations Oral care  BID Other Recommendations --   CHL IP FOLLOW UP RECOMMENDATIONS 03/05/2018 Follow up Recommendations Outpatient SLP   No flowsheet data found.     CHL IP ORAL PHASE 03/05/2018 Oral Phase Impaired Oral - Pudding Teaspoon -- Oral - Pudding Cup -- Oral - Honey Teaspoon -- Oral - Honey Cup -- Oral - Nectar Teaspoon -- Oral - Nectar Cup Reduced posterior propulsion Oral - Nectar Straw Reduced posterior propulsion Oral - Thin Teaspoon Lingual pumping Oral - Thin Cup Reduced posterior propulsion Oral - Thin Straw Reduced posterior propulsion Oral - Puree WFL;Lingual pumping Oral - Mech Soft -- Oral - Regular WFL Oral - Multi-Consistency -- Oral - Pill WFL Oral Phase - Comment lingual pumping with thin via tsp and puree, swallow appears effortful with increased visocity  CHL IP PHARYNGEAL PHASE 03/05/2018 Pharyngeal Phase Impaired Pharyngeal- Pudding Teaspoon -- Pharyngeal -- Pharyngeal- Pudding Cup -- Pharyngeal -- Pharyngeal- Honey Teaspoon -- Pharyngeal -- Pharyngeal- Honey Cup -- Pharyngeal -- Pharyngeal- Nectar Teaspoon -- Pharyngeal -- Pharyngeal- Nectar Cup Reduced pharyngeal peristalsis;Reduced epiglottic inversion;Reduced laryngeal elevation;Reduced airway/laryngeal closure;Reduced tongue base retraction;Pharyngeal residue - valleculae;Pharyngeal residue - pyriform Pharyngeal -- Pharyngeal- Nectar Straw Reduced pharyngeal peristalsis;Reduced epiglottic inversion;Reduced laryngeal elevation;Reduced airway/laryngeal closure;Reduced tongue base retraction;Pharyngeal residue -  valleculae;Pharyngeal residue - pyriform Pharyngeal -- Pharyngeal- Thin Teaspoon Reduced pharyngeal peristalsis;Reduced epiglottic inversion;Reduced laryngeal elevation;Reduced airway/laryngeal closure;Reduced tongue base retraction;Pharyngeal residue - valleculae;Pharyngeal residue - pyriform Pharyngeal -- Pharyngeal- Thin Cup Reduced pharyngeal peristalsis;Reduced epiglottic inversion;Reduced laryngeal elevation;Reduced airway/laryngeal closure;Reduced tongue base retraction;Pharyngeal residue - valleculae;Pharyngeal residue - pyriform Pharyngeal -- Pharyngeal- Thin Straw Reduced pharyngeal peristalsis;Reduced epiglottic inversion;Reduced laryngeal elevation;Reduced airway/laryngeal closure;Reduced tongue base retraction;Penetration/Aspiration during swallow;Pharyngeal residue - valleculae;Pharyngeal residue - pyriform Pharyngeal Material enters airway, remains ABOVE vocal cords then ejected out Pharyngeal- Puree Reduced pharyngeal peristalsis;Reduced epiglottic inversion;Pharyngeal residue - valleculae;Pharyngeal residue - pyriform Pharyngeal -- Pharyngeal- Mechanical Soft -- Pharyngeal -- Pharyngeal- Regular Reduced pharyngeal peristalsis;Reduced epiglottic inversion;Pharyngeal residue - valleculae;Pharyngeal residue - pyriform Pharyngeal -- Pharyngeal- Multi-consistency -- Pharyngeal -- Pharyngeal- Pill Reduced pharyngeal peristalsis;Reduced epiglottic inversion;Reduced laryngeal elevation;Reduced airway/laryngeal closure;Reduced tongue base retraction;Pharyngeal residue - valleculae;Pharyngeal residue - pyriform Pharyngeal -- Pharyngeal Comment various postures, head turn left *to pt's reported weak side where he "drag his leg" with and without chin tuck not helpful to prevent residual accumulation and actually allowed TRACE penetration of thin due to spillage from pyriform sinus into airway  CHL IP CERVICAL ESOPHAGEAL PHASE 03/05/2018 Cervical Esophageal Phase Impaired Pudding Teaspoon -- Pudding Cup -- Honey  Teaspoon -- Honey Cup -- Nectar Teaspoon -- Nectar Cup -- Nectar Straw -- Thin Teaspoon -- Thin Cup -- Thin Straw -- Puree -- Mechanical Soft -- Regular -- Multi-consistency -- Pill -- Cervical Esophageal Comment decreased clearance into esophagus resulting in pyriform sinus residuals Macario Golds 03/05/2018, 4:13 PM      Luanna Salk, MS [facility] SLP Acute Rehab Services Pager [phone] Office [phone]       CLINICAL DATA:  Dysphagia. EXAM: MODIFIED BARIUM SWALLOW TECHNIQUE: Different consistencies of barium were administered orally to the patient by the Speech Pathologist. Imaging of the pharynx was performed in the lateral projection. FLUOROSCOPY TIME:  Fluoroscopy Time:  1 minutes 42 seconds Radiation Exposure Index (if provided by the fluoroscopic device): 8.2 MGy Number of Acquired Spot Images: 0 COMPARISON:  None. FINDINGS: Thin liquid- within normal limits Nectar thick liquid- within normal limits Honey- within normal limits Pure- within normal limits Cracker-within normal limits Pure with cracker- within normal limits Barium tablet -  within normal limits Pooling  of contrast within the hypopharynx was noted with all consistencies. IMPRESSION: Pooling of contrast within the hypopharynx, otherwise no evidence of laryngeal penetration or tracheal aspiration. Please refer to the Speech Pathologists report for complete details and recommendations. Electronically Signed   By: Fidela Salisbury M.D.   On: 03/05/2018 14:30     Assessment & Plan:  Plan  I am having William Williamson start on acetic acid and hydrochlorothiazide. I am also having him maintain his Cholecalciferol, dorzolamide-timolol, polyethylene glycol, latanoprost, docusate sodium, simvastatin, carbidopa-levodopa, and levothyroxine.  Meds ordered this encounter  Medications   acetic acid 2 % otic solution    Sig: Place 4 drops into both ears every 3 (three) hours.    Dispense:  15 mL    Refill:  0   hydrochlorothiazide  (HYDRODIURIL) 25 MG tablet    Sig: Take 1 tablet (25 mg total) by mouth daily.    Dispense:  90 tablet    Refill:  3    Problem List Items Addressed This Visit      Unprioritized   Lower extremity edema    Diuretics -- hctz  Elevate legs  Compression socks  F/u 2 weeks       Relevant Medications   hydrochlorothiazide (HYDRODIURIL) 25 MG tablet   Otalgia - Primary    Actually itching  Try vosol gtts ---  Will refer to ent prn       Relevant Medications   acetic acid 2 % otic solution      Follow-up: Return in about 3 weeks (around 09/30/2018), or if symptoms worsen or fail to improve, for edema.  [name], DO

## 2018-09-09 NOTE — Assessment & Plan Note (Signed)
Actually itching  Try vosol gtts ---  Will refer to ent prn

## 2018-09-09 NOTE — Assessment & Plan Note (Signed)
Diuretics -- hctz  Elevate legs  Compression socks  F/u 2 weeks

## 2018-09-09 NOTE — Patient Instructions (Signed)

## 2018-09-23 ENCOUNTER — Other Ambulatory Visit: Payer: Self-pay

## 2018-09-23 ENCOUNTER — Encounter: Payer: Self-pay | Admitting: Family Medicine

## 2018-09-23 ENCOUNTER — Ambulatory Visit (INDEPENDENT_AMBULATORY_CARE_PROVIDER_SITE_OTHER): Payer: PPO | Admitting: Family Medicine

## 2018-09-23 VITALS — BP 144/98 | HR 72 | Temp 97.9°F | Resp 16 | Ht 73.0 in | Wt 173.0 lb

## 2018-09-23 DIAGNOSIS — I10 Essential (primary) hypertension: Secondary | ICD-10-CM

## 2018-09-23 DIAGNOSIS — E039 Hypothyroidism, unspecified: Secondary | ICD-10-CM

## 2018-09-23 DIAGNOSIS — R600 Localized edema: Secondary | ICD-10-CM | POA: Diagnosis not present

## 2018-09-23 DIAGNOSIS — G20 Parkinson's disease: Secondary | ICD-10-CM

## 2018-09-23 LAB — BASIC METABOLIC PANEL
BUN: 20 mg/dL (ref 6–23)
CO2: 32 mEq/L (ref 19–32)
Calcium: 9.3 mg/dL (ref 8.4–10.5)
Chloride: 92 mEq/L — ABNORMAL LOW (ref 96–112)
Creatinine, Ser: 0.99 mg/dL (ref 0.40–1.50)
GFR: 72.11 mL/min (ref >60.00)
Glucose, Bld: 93 mg/dL (ref 70–99)
Potassium: 4.3 mEq/L (ref 3.5–5.1)
Sodium: 131 mEq/L — ABNORMAL LOW (ref 135–145)

## 2018-09-23 MED ORDER — NONFORMULARY OR COMPOUNDED ITEM: 0 refills | Status: DC

## 2018-09-23 NOTE — Progress Notes (Signed)
Patient ID: William Williamson, male    DOB: May 19, 1932  Age: 85 y.o. MRN: [id number]    Subjective:  Subjective  [id number] [name] presents for f/u edema   It has resolved with the diuretic    Pt is frustrated with the pd getting worse.  He know he needs to get a walker and is agreeable to [facility] coming to the house for PT and safety eval.    Review of Systems  Constitutional: Negative for appetite change, diaphoresis, fatigue and unexpected weight change.  Eyes: Negative for pain, redness and visual disturbance.  Respiratory: Negative for cough, chest tightness, shortness of breath and wheezing.   Cardiovascular: Negative for chest pain, palpitations and leg swelling.  Endocrine: Negative for cold intolerance, heat intolerance, polydipsia, polyphagia and polyuria.  Genitourinary: Negative for difficulty urinating, dysuria and frequency.  Musculoskeletal: Positive for gait problem.  Neurological: Positive for dizziness, tremors and weakness. Negative for light-headedness, numbness and headaches.    History Past Medical History:  Diagnosis Date  . Anemia 1952   post Oromycin for [place]  . Anisocoria    post op  . Arthritis   . Autonomic dysfunction   . GERD (gastroesophageal reflux disease)   . Glaucoma    Dr Bing Plume  . Hearing loss in right ear   . Hypercholesteremia   . Hyperthyroidism    s/p RAI  . LVH (left ventricular hypertrophy)   . Orthostatic hypotension   . Parkinson's disease [facility])     He has a past surgical history that includes Septoplasty (1970); Cardiac catheterization (07/2006); Vasectomy; RAI ablation (02/2008); Cataract extraction, bilateral; and Colonoscopy.   His family history includes Diabetes in his brother; Heart attack (age of onset: 36) in his maternal grandfather; Hypertension in his brother and sister; Osteoporosis in his mother; Pancreatic cancer in his brother; Prostate cancer in his brother; Stomach cancer in his maternal grandmother.He  reports that he quit smoking about 50 years ago. He has never used smokeless tobacco. He reports current alcohol use of about 6.0 standard drinks of alcohol per week. He reports that he does not use drugs.  Current Outpatient Medications on File Prior to Visit  Medication Sig Dispense Refill  . carbidopa-levodopa (SINEMET IR) 25-100 MG tablet TAKE 2 TABLETS BY MOUTH  THREE TIMES DAILY 540 tablet 0  . Cholecalciferol 2000 UNITS CAPS Take 1 capsule by mouth daily.     Marland Kitchen docusate sodium (COLACE) 100 MG capsule Take 200 mg by mouth daily as needed for mild constipation.    . dorzolamide-timolol (COSOPT) 22.3-6.8 MG/ML ophthalmic solution Place 1 drop into both eyes 2 (two) times daily.     . hydrochlorothiazide (HYDRODIURIL) 25 MG tablet Take 1 tablet (25 mg total) by mouth daily. 90 tablet 3  . latanoprost (XALATAN) 0.005 % ophthalmic solution Place 1 drop into both eyes at bedtime.     Marland Kitchen levothyroxine (SYNTHROID) 112 MCG tablet Take 1 tablet (112 mcg total) by mouth daily. 90 tablet 0  . polyethylene glycol (MIRALAX / GLYCOLAX) packet Take 17 g by mouth daily as needed.     . simvastatin (ZOCOR) 20 MG tablet Take 1 tablet (20 mg total) by mouth every evening. 90 tablet 1   No current facility-administered medications on file prior to visit.      Objective:  Objective  Physical Exam Vitals signs and nursing note reviewed.  Constitutional:      General: He is sleeping.     Appearance: He is well-developed.  HENT:  Head: Normocephalic and atraumatic.  Eyes:     Pupils: Pupils are equal, round, and reactive to light.  Neck:     Musculoskeletal: Normal range of motion and neck supple.     Thyroid: No thyromegaly.  Cardiovascular:     Rate and Rhythm: Normal rate and regular rhythm.     Heart sounds: No murmur.  Pulmonary:     Effort: Pulmonary effort is normal. No respiratory distress.     Breath sounds: Normal breath sounds. No wheezing or rales.  Chest:     Chest wall: No  tenderness.  Musculoskeletal:        General: No tenderness.  Skin:    General: Skin is warm and dry.  Neurological:     Mental Status: He is oriented to person, place, and time.     Gait: Gait abnormal.     Comments: Tremor   Psychiatric:        Behavior: Behavior normal.        Thought Content: Thought content normal.        Judgment: Judgment normal.    BP (!) 144/98 (BP Location: Right Arm, Patient Position: Sitting, Cuff Size: Normal)   Pulse 72   Temp 97.9 F (36.6 C) (Oral)   Resp 16   Ht 6\' 1"  (1.854 m)   Wt 173 lb (78.5 kg)   BMI 22.82 kg/m  Wt Readings from Last 3 Encounters:  09/23/18 173 lb (78.5 kg)  09/09/18 176 lb 12.8 oz (80.2 kg)  02/20/18 178 lb 12.8 oz (81.1 kg)     Lab Results  Component Value Date   WBC 7.7 08/22/2017   HGB 14.4 08/22/2017   HCT 42.6 08/22/2017   PLT 200.0 08/22/2017   GLUCOSE 89 09/03/2018   CHOL 124 09/03/2018   TRIG 84.0 09/03/2018   HDL 40.50 09/03/2018   LDLCALC 67 09/03/2018   ALT 2 09/03/2018   AST 15 09/03/2018   NA 137 09/03/2018   K 4.6 09/03/2018   CL 100 09/03/2018   CREATININE 1.09 09/03/2018   BUN 13 09/03/2018   CO2 30 09/03/2018   TSH 0.28 (L) 09/03/2018   PSA 4.85 (H) 08/16/2016   INR 0.9 RATIO 07/26/2006   HGBA1C 5.9 10/09/2006    Dg Op Swallowing Func-medicare/speech Path  Result Date: 03/05/2018 Objective Swallowing Evaluation: Type of Study: MBS-Modified Barium Swallow Study  Patient Details Name: [name] MRN: [id number] Date of Birth: [birth year] Today's Date: 03/05/2018 Time: SLP Start Time (ACUTE ONLY): 1328 -SLP Stop Time (ACUTE ONLY): 1410 SLP Time Calculation (min) (ACUTE ONLY): 42 min Past Medical History: Past Medical History: Diagnosis Date . Anemia 1952  post Oromycin for [place] . Anisocoria   post op . Arthritis  . Autonomic dysfunction  . GERD (gastroesophageal reflux disease)  . Glaucoma   Dr Bing Plume . Hearing loss in right ear  . Hypercholesteremia  . Hyperthyroidism   s/p RAI  . LVH (left ventricular hypertrophy)  . Orthostatic hypotension  . Parkinson's disease [facility])  Past Surgical History: Past Surgical History: Procedure Laterality Date . CARDIAC CATHETERIZATION  07/2006  Dr.  Eustace Quail, negative . CATARACT EXTRACTION, BILATERAL    OS retinal surgery for post op floaters . COLONOSCOPY    negative X 3 . RAI ablation  02/2008  hyperthyroidism . SEPTOPLASTY  1970 . VASECTOMY   HPI: 85 yo gentleman referred for MBS by Dr Etter Sjogren due to pt report of coughing with intake - He reports most difficulties with solids requiring  him to cough and at times expectorate solids mixed with secretions.  He states this mostly occurs immediately after eating.  Reports difficulties with nuts, oranges, etc.  Pt denies weight loss, pneumonias nor requiring heimlich manuever.  He has not had prior swallow studies.  H/o GERD but pt states this "went away".  Pt also reports he frequently drools.   Subjective: pt awake sitting upright in chair, notable excessive upper body movement due to his Parkinsons Assessment / Plan / Recommendation CHL IP CLINICAL IMPRESSIONS 03/05/2018 Clinical Impression Patient presents with mild oral and moderate pharyngeal dysphagia with sensorimotor deficits.  Impaired pharyngeal contraction, tongue base retraction and laryngeal elevation results in residuals across all consistencies.  Various postures including head turn left *to pt's purported weak side* with and without chin tuck with thin and nectar did not prevent accumulation of barium and resulted in trace laryngeal penetration of thin as barium spilled from pyriform into open larynx.  He did NOT aspirate and trace penetrates cleared with further swallow.  Pt does not always sense residuals and pharyngeal residuals were not worse with solids compared to liquids.   SLP taxed pt in effort to try to get him to aspirate with sequential boluses of thin (7) but he did not.  Dry swallows *reflexive and cued* help to decrease residuals.    Cued "hock" helpful for pt to expel vallecular residuals and advised pt to use this strategy during meals to assure vallecular clearance.  Pt able to transit and swallow barium tablet with thin without difficulty.  Upon esophageal sweep, esophagus appeared clear but this test is not intended to evaluate esophageal function.  Advised pt to start meals with liquids *prefer water*, conduct multiple dry swallows per bolus, masticate thoroughly, conduct effortful swallow, follow solids with liquids, intermittent "hock".  Also provided him with lingual press and effortful swallow exercise to facilitate pharyngeal contraction.  Advised he use caution with foods that cause difficulties like oranges, nuts - and he may need to avoid these due to increased pulmonary risk if aspirating solids.  Please consider referral for OP for pt to undergo Respiratory Muscle Strength Training *RMST* to facilitate laryngeal closure, voice and cough strength for maximal swallow/voice benefits.  Using teach back and written instructions pt educated.  Thanks for this referral.   SLP Visit Diagnosis Dysphagia, oropharyngeal phase (R13.12);Dysphagia, pharyngoesophageal phase (R13.14) Attention and concentration deficit following -- Frontal lobe and executive function deficit following -- Impact on safety and function Moderate aspiration risk   No flowsheet data found.  No flowsheet data found. CHL IP DIET RECOMMENDATION 03/05/2018 SLP Diet Recommendations Regular solids;Thin liquid Liquid Administration via Cup;Straw Medication Administration Other ( as tolerated, with pudding if problematic Compensations Slow rate;Small sips/bites;Multiple dry swallows after each bite/sip;Follow solids with liquid;Effortful swallow, intermittent "hock" Postural Changes Remain semi-upright after after feeds/meals (Comment);Seated upright at 90 degrees   CHL IP OTHER RECOMMENDATIONS 03/05/2018 Recommended Consults OP therapy for feeding Oral Care Recommendations  Oral care BID Other Recommendations --   CHL IP FOLLOW UP RECOMMENDATIONS 03/05/2018 Follow up Recommendations Outpatient SLP   No flowsheet data found.     CHL IP ORAL PHASE 03/05/2018 Oral Phase Impaired Oral - Pudding Teaspoon -- Oral - Pudding Cup -- Oral - Honey Teaspoon -- Oral - Honey Cup -- Oral - Nectar Teaspoon -- Oral - Nectar Cup Reduced posterior propulsion Oral - Nectar Straw Reduced posterior propulsion Oral - Thin Teaspoon Lingual pumping Oral - Thin Cup Reduced posterior propulsion Oral - Thin Straw  Reduced posterior propulsion Oral - Puree WFL;Lingual pumping Oral - Mech Soft -- Oral - Regular WFL Oral - Multi-Consistency -- Oral - Pill WFL Oral Phase - Comment lingual pumping with thin via tsp and puree, swallow appears effortful with increased visocity  CHL IP PHARYNGEAL PHASE 03/05/2018 Pharyngeal Phase Impaired Pharyngeal- Pudding Teaspoon -- Pharyngeal -- Pharyngeal- Pudding Cup -- Pharyngeal -- Pharyngeal- Honey Teaspoon -- Pharyngeal -- Pharyngeal- Honey Cup -- Pharyngeal -- Pharyngeal- Nectar Teaspoon -- Pharyngeal -- Pharyngeal- Nectar Cup Reduced pharyngeal peristalsis;Reduced epiglottic inversion;Reduced laryngeal elevation;Reduced airway/laryngeal closure;Reduced tongue base retraction;Pharyngeal residue - valleculae;Pharyngeal residue - pyriform Pharyngeal -- Pharyngeal- Nectar Straw Reduced pharyngeal peristalsis;Reduced epiglottic inversion;Reduced laryngeal elevation;Reduced airway/laryngeal closure;Reduced tongue base retraction;Pharyngeal residue - valleculae;Pharyngeal residue - pyriform Pharyngeal -- Pharyngeal- Thin Teaspoon Reduced pharyngeal peristalsis;Reduced epiglottic inversion;Reduced laryngeal elevation;Reduced airway/laryngeal closure;Reduced tongue base retraction;Pharyngeal residue - valleculae;Pharyngeal residue - pyriform Pharyngeal -- Pharyngeal- Thin Cup Reduced pharyngeal peristalsis;Reduced epiglottic inversion;Reduced laryngeal elevation;Reduced airway/laryngeal  closure;Reduced tongue base retraction;Pharyngeal residue - valleculae;Pharyngeal residue - pyriform Pharyngeal -- Pharyngeal- Thin Straw Reduced pharyngeal peristalsis;Reduced epiglottic inversion;Reduced laryngeal elevation;Reduced airway/laryngeal closure;Reduced tongue base retraction;Penetration/Aspiration during swallow;Pharyngeal residue - valleculae;Pharyngeal residue - pyriform Pharyngeal Material enters airway, remains ABOVE vocal cords then ejected out Pharyngeal- Puree Reduced pharyngeal peristalsis;Reduced epiglottic inversion;Pharyngeal residue - valleculae;Pharyngeal residue - pyriform Pharyngeal -- Pharyngeal- Mechanical Soft -- Pharyngeal -- Pharyngeal- Regular Reduced pharyngeal peristalsis;Reduced epiglottic inversion;Pharyngeal residue - valleculae;Pharyngeal residue - pyriform Pharyngeal -- Pharyngeal- Multi-consistency -- Pharyngeal -- Pharyngeal- Pill Reduced pharyngeal peristalsis;Reduced epiglottic inversion;Reduced laryngeal elevation;Reduced airway/laryngeal closure;Reduced tongue base retraction;Pharyngeal residue - valleculae;Pharyngeal residue - pyriform Pharyngeal -- Pharyngeal Comment various postures, head turn left *to pt's reported weak side where he "drag his leg" with and without chin tuck not helpful to prevent residual accumulation and actually allowed TRACE penetration of thin due to spillage from pyriform sinus into airway  CHL IP CERVICAL ESOPHAGEAL PHASE 03/05/2018 Cervical Esophageal Phase Impaired Pudding Teaspoon -- Pudding Cup -- Honey Teaspoon -- Honey Cup -- Nectar Teaspoon -- Nectar Cup -- Nectar Straw -- Thin Teaspoon -- Thin Cup -- Thin Straw -- Puree -- Mechanical Soft -- Regular -- Multi-consistency -- Pill -- Cervical Esophageal Comment decreased clearance into esophagus resulting in pyriform sinus residuals Macario Golds 03/05/2018, 4:13 PM      Luanna Salk, MS [facility] SLP Acute Rehab Services Pager [phone] Office [phone]       CLINICAL DATA:   Dysphagia. EXAM: MODIFIED BARIUM SWALLOW TECHNIQUE: Different consistencies of barium were administered orally to the patient by the Speech Pathologist. Imaging of the pharynx was performed in the lateral projection. FLUOROSCOPY TIME:  Fluoroscopy Time:  1 minutes 42 seconds Radiation Exposure Index (if provided by the fluoroscopic device): 8.2 MGy Number of Acquired Spot Images: 0 COMPARISON:  None. FINDINGS: Thin liquid- within normal limits Nectar thick liquid- within normal limits Honey- within normal limits Pure- within normal limits Cracker-within normal limits Pure with cracker- within normal limits Barium tablet -  within normal limits Pooling of contrast within the hypopharynx was noted with all consistencies. IMPRESSION: Pooling of contrast within the hypopharynx, otherwise no evidence of laryngeal penetration or tracheal aspiration. Please refer to the Speech Pathologists report for complete details and recommendations. Electronically Signed   By: Fidela Salisbury M.D.   On: 03/05/2018 14:30     Assessment & Plan:  Plan  I have discontinued Jori Moll A. Turrell's acetic acid. I am also having him start on NONFORMULARY OR COMPOUNDED ITEM. Additionally, I am having him maintain his Cholecalciferol, dorzolamide-timolol, polyethylene  glycol, latanoprost, docusate sodium, simvastatin, carbidopa-levodopa, levothyroxine, and hydrochlorothiazide.  Meds ordered this encounter  Medications  . NONFORMULARY OR COMPOUNDED ITEM    Sig: Walker  #1   Dx parkinson disease    Dispense:  1 each    Refill:  0    Problem List Items Addressed This Visit      Unprioritized   Essential hypertension    Pt states he was a little stressed , getting here today Will recheck bp when he comes in for labs next month      Hypothyroidism    Recheck labs next month      Lower extremity edema - Primary   Relevant Orders   Basic metabolic panel   Parkinson disease ([place])    Per neuro Will get [facility] to come to  the house and have PT and safety evaluation  rx for walker given as well      Relevant Medications   NONFORMULARY OR COMPOUNDED ITEM   Other Relevant Orders   Ambulatory referral to [facility]    +  Follow-up: Return in about 3 months (around 12/24/2018), or if symptoms worsen or fail to improve.  [name], DO

## 2018-09-23 NOTE — Patient Instructions (Signed)
Fall Prevention in the Home, Adult Falls can cause injuries. They can happen to people of all ages. There are many things you can do to make your home safe and to help prevent falls. Ask for help when making these changes, if needed. What actions can I take to prevent falls? General Instructions  Use good lighting in all rooms. Replace any light bulbs that burn out.  Turn on the lights when you go into a dark area. Use night-lights.  Keep items that you use often in easy-to-reach places. Lower the shelves around your home if necessary.  Set up your furniture so you have a clear path. Avoid moving your furniture around.  Do not have throw rugs and other things on the floor that can make you trip.  Avoid walking on wet floors.  If any of your floors are uneven, fix them.  Add color or contrast paint or tape to clearly mark and help you see: ? Any grab bars or handrails. ? First and last steps of stairways. ? Where the edge of each step is.  If you use a stepladder: ? Make sure that it is fully opened. Do not climb a closed stepladder. ? Make sure that both sides of the stepladder are locked into place. ? Ask someone to hold the stepladder for you while you use it.  If there are any pets around you, be aware of where they are. What can I do in the bathroom?      Keep the floor dry. Clean up any water that spills onto the floor as soon as it happens.  Remove soap buildup in the tub or shower regularly.  Use non-skid mats or decals on the floor of the tub or shower.  Attach bath mats securely with double-sided, non-slip rug tape.  If you need to sit down in the shower, use a plastic, non-slip stool.  Install grab bars by the toilet and in the tub and shower. Do not use towel bars as grab bars. What can I do in the bedroom?  Make sure that you have a light by your bed that is easy to reach.  Do not use any sheets or blankets that are too big for your  bed. They should not hang down onto the floor.  Have a firm chair that has side arms. You can use this for support while you get dressed. What can I do in the kitchen?  Clean up any spills right away.  If you need to reach something above you, use a strong step stool that has a grab bar.  Keep electrical cords out of the way.  Do not use floor polish or wax that makes floors slippery. If you must use wax, use non-skid floor wax. What can I do with my stairs?  Do not leave any items on the stairs.  Make sure that you have a light switch at the top of the stairs and the bottom of the stairs. If you do not have them, ask someone to add them for you.  Make sure that there are handrails on both sides of the stairs, and use them. Fix handrails that are broken or loose. Make sure that handrails are as long as the stairways.  Install non-slip stair treads on all stairs in your home.  Avoid having throw rugs at the top or bottom of the stairs. If you do have throw rugs, attach them to the floor  with carpet tape.  Choose a carpet that does not hide the edge of the steps on the stairway.  Check any carpeting to make sure that it is firmly attached to the stairs. Fix any carpet that is loose or worn. What can I do on the outside of my home?  Use bright outdoor lighting.  Regularly fix the edges of walkways and driveways and fix any cracks.  Remove anything that might make you trip as you walk through a door, such as a raised step or threshold.  Trim any bushes or trees on the path to your home.  Regularly check to see if handrails are loose or broken. Make sure that both sides of any steps have handrails.  Install guardrails along the edges of any raised decks and porches.  Clear walking paths of anything that might make someone trip, such as tools or rocks.  Have any leaves, snow, or ice cleared regularly.  Use sand or salt on walking paths during winter.  Clean up any spills in  your garage right away. This includes grease or oil spills. What other actions can I take?  Wear shoes that: ? Have a low heel. Do not wear high heels. ? Have rubber bottoms. ? Are comfortable and fit you well. ? Are closed at the toe. Do not wear open-toe sandals.  Use tools that help you move around (mobility aids) if they are needed. These include: ? Canes. ? Walkers. ? Scooters. ? Crutches.  Review your medicines with your doctor. Some medicines can make you feel dizzy. This can increase your chance of falling. Ask your doctor what other things you can do to help prevent falls. Where to find more information  Centers for Disease Control and Prevention, STEAD[facility]: https://garcia.biz/  [employer] on Aging: BrainJudge.co.uk Contact a doctor if:  You are afraid of falling at home.  You feel weak, drowsy, or dizzy at home.  You fall at home. Summary  There are many simple things that you can do to make your home safe and to help prevent falls.  Ways to make your home safe include removing tripping hazards and installing grab bars in the bathroom.  Ask for help when making these changes in your home. This information is not intended to replace advice given to you by your health care provider. Make sure you discuss any questions you have with your health care provider. Document Released: 01/13/2009 Document Revised: 11/01/2016 Document Reviewed: 11/01/2016 Elsevier Interactive Patient Education  2019 [employer]. Potassium Content of Foods  Potassium is a mineral found in many foods and drinks. It affects how the heart works, and helps keep fluids and minerals balanced in the body. The amount of potassium you need each day depends on your age and any medical conditions you may have. Talk to your health care provider or dietitian about how much potassium you need. The following lists of foods provide the general serving size for foods and the approximate amount of  potassium in each serving, listed in milligrams (mg). Actual values may vary depending on the product and how it is processed. High in potassium The following foods and beverages have 200 mg or more of potassium per serving:  Apricots (raw) - 2 have 200 mg of potassium.  Apricots (dry) - 5 have 200 mg of potassium.  Artichoke - 1 medium has 345 mg of potassium.  Avocado -  fruit has 245 mg of potassium.  Banana - 1 medium fruit has 425 mg of  potassium.  [place] or baked beans (canned) -  cup has 280 mg of potassium.  White beans (canned) -  cup has 595 mg potassium.  Beef roast - 3 oz has 320 mg of potassium.  Ground beef - 3 oz has 270 mg of potassium.  Beets (raw or cooked) -  cup has 260 mg of potassium.  Bran muffin - 2 oz has 300 mg of potassium.  Broccoli (cooked) -  cup has 230 mg of potassium.  Brussels sprouts -  cup has 250 mg of potassium.  Cantaloupe -  cup has 215 mg of potassium.  Cereal, 100% bran -  cup has 200-400 mg of potassium.  Cheeseburger -1 single fast food burger has 225-400 mg of potassium.  Chicken - 3 oz has 220 mg of potassium.  Clams (canned) - 3 oz has 535 mg of potassium.  Crab - 3 oz has 225 mg of potassium.  Dates - 5 have 270 mg of potassium.  Dried beans and peas -  cup has 300-475 mg of potassium.  Figs (dried) - 2 have 260 mg of potassium.  Fish (halibut, tuna, cod, snapper) - 3 oz has 480 mg of potassium.  Fish (salmon, haddock, swordfish, perch) - 3 oz has 300 mg of potassium.  Fish (tuna, canned) - 3 oz has 200 mg of potassium.  [place] fries (fast food) - 3 oz has 470 mg of potassium.  Granola with fruit and nuts -  cup has 200 mg of potassium.  Grapefruit juice -  cup has 200 mg of potassium.  Honeydew melon -  cup has 200 mg of potassium.  Kale (raw) - 1 cup has 300 mg of potassium.  Kiwi - 1 medium fruit has 240 mg of potassium.  Kohlrabi, rutabaga, parsnips -  cup has 280 mg of potassium.   Lentils -  cup has 365 mg of potassium.  Mango - 1 each has 325 mg of potassium.  Milk (nonfat, low-fat, whole, buttermilk) - 1 cup has 350-380 mg of potassium.  Milk (chocolate) - 1 cup has 420 mg of potassium  Molasses - 1 Tbsp has 295 mg of potassium.  Mushrooms -  cup has 280 mg of potassium.  Nectarine - 1 each has 275 mg of potassium.  Nuts (almonds, peanuts, hazelnuts, [place], cashew, mixed) - 1 oz has 200 mg of potassium.  Nuts (pistachios) - 1 oz has 295 mg of potassium.  Orange - 1 fruit has 240 mg of potassium.  Orange juice -  cup has 235 mg of potassium.  Papaya -  medium fruit has 390 mg of potassium.  Peanut butter (chunky) - 2 Tbsp has 240 mg of potassium.  Peanut butter (smooth) - 2 Tbsp has 210 mg of potassium.  Pear - 1 medium (200 mg) of potassium.  Pomegranate - 1 whole fruit has 400 mg of potassium.  Pomegranate juice -  cup has 215 mg of potassium.  Pork - 3 oz has 350 mg of potassium.  Potato chips (salted) - 1 oz has 465 mg of potassium.  Potato (baked with skin) - 1 medium has 925 mg of potassium.  Potato (boiled) -  cup has 255 mg of potassium.  Potato (Mashed) -  cup has 330 mg of potassium.  Prune juice -  cup has 370 mg of potassium.  Prunes - 5 have 305 mg of potassium.  Pudding (chocolate) -  cup has 230 mg of potassium.  Pumpkin (canned) -  cup has 250  mg of potassium.  Raisins (seedless) -  cup has 270 mg of potassium.  Seeds (sunflower or pumpkin) - 1 oz has 240 mg of potassium.  Soy milk - 1 cup has 300 mg of potassium.  Spinach (cooked) - 1/2 cup has 420 mg of potassium.  Spinach (canned) -  cup has 370 mg of potassium.  Sweet potato (baked with skin) - 1 medium has 450 mg of potassium.  Swiss chard -  cup has 480 mg of potassium.  Tomato or vegetable juice -  cup has 275 mg of potassium.  Tomato (sauce or puree) -  cup has 400-550 mg of potassium.  Tomato (raw) - 1 medium has 290 mg of  potassium.  Tomato (canned) -  cup has 200-300 mg of potassium.  [place] - 3 oz has 250 mg of potassium.  Wheat germ - 1 oz has 250 mg of potassium.  Winter squash -  cup has 250 mg of potassium.  Yogurt (plain or fruited) - 6 oz has 260-435 mg of potassium.  Zucchini -  cup has 220 mg of potassium. Moderate in potassium The following foods and beverages have 50-200 mg of potassium per serving:  Apple - 1 fruit has 150 mg of potassium  Apple juice -  cup has 150 mg of potassium  Applesauce -  cup has 90 mg of potassium  Apricot nectar -  cup has 140 mg of potassium  Asparagus (small spears) -  cup has 155 mg of potassium  Asparagus (large spears) - 6 have 155 mg of potassium  Bagel (cinnamon raisin) - 1 four-inch bagel has 130 mg of potassium  Bagel (egg or plain) - 1 four- inch bagel has 70 mg of potassium  Beans (green) -  cup has 90 mg of potassium  Beans (yellow) -  cup has 190 mg of potassium  Beer, regular - 12 oz has 100 mg of potassium  Beets (canned) -  cup has 125 mg of potassium  Blackberries -  cup has 115 mg of potassium  Blueberries -  cup has 60 mg of potassium  Bread (whole wheat) - 1 slice has 70 mg of potassium  Broccoli (raw) -  cup has 145 mg of potassium  Cabbage -  cup has 150 mg of potassium  Carrots (cooked or raw) -  cup has 180 mg of potassium  Cauliflower (raw) -  cup has 150 mg of potassium  Celery (raw) -  cup has 155 mg of potassium  Cereal, bran flakes -  cup has 120-150 mg of potassium  Cheese (cottage) -  cup has 110 mg of potassium  Cherries - 10 have 150 mg of potassium  Chocolate - 1 oz bar has 165 mg of potassium  Coffee (brewed) - 6 oz has 90 mg of potassium  Corn -  cup or 1 ear has 195 mg of potassium  Cucumbers -  cup has 80 mg of potassium  Egg - 1 large egg has 60 mg of potassium  Eggplant -  cup has 60 mg of potassium  Endive (raw) -  cup has 80 mg of potassium  English  muffin - 1 has 65 mg of potassium  Fish (ocean perch) - 3 oz has 192 mg of potassium  Frankfurter, beef or pork - 1 has 75 mg of potassium  Fruit cocktail -  cup has 115 mg of potassium  Grape juice -  cup has 170 mg of potassium  Grapefruit -  fruit  has 175 mg of potassium  Grapes -  cup has 155 mg of potassium  Greens: kale, turnip, collard -  cup has 110-150 mg of potassium  Ice cream or frozen yogurt (chocolate) -  cup has 175 mg of potassium  Ice cream or frozen yogurt (vanilla) -  cup has 120-150 mg of potassium  Lemons, limes - 1 each has 80 mg of potassium  Lettuce - 1 cup has 100 mg of potassium  Mixed vegetables -  cup has 150 mg of potassium  Mushrooms, raw -  cup has 110 mg of potassium  Nuts (walnuts, pecans, or macadamia) - 1 oz has 125 mg of potassium  Oatmeal -  cup has 80 mg of potassium  Okra -  cup has 110 mg of potassium  Onions -  cup has 120 mg of potassium  Peach - 1 has 185 mg of potassium  Peaches (canned) -  cup has 120 mg of potassium  Pears (canned) -  cup has 120 mg of potassium  Peas, green (frozen) -  cup has 90 mg of potassium  Peppers (Green) -  cup has 130 mg of potassium  Peppers (Red) -  cup has 160 mg of potassium  Pineapple juice -  cup has 165 mg of potassium  Pineapple (fresh or canned) -  cup has 100 mg of potassium  Plums - 1 has 105 mg of potassium  Pudding, vanilla -  cup has 150 mg of potassium  Raspberries -  cup has 90 mg of potassium  Rhubarb -  cup has 115 mg of potassium  Rice, wild -  cup has 80 mg of potassium  Shrimp - 3 oz has 155 mg of potassium  Spinach (raw) - 1 cup has 170 mg of potassium  Strawberries -  cup has 125 mg of potassium  Summer squash -  cup has 175-200 mg of potassium  Swiss chard (raw) - 1 cup has 135 mg of potassium  Tangerines - 1 fruit has 140 mg of potassium  Tea, brewed - 6 oz has 65 mg of potassium  Turnips -  cup has 140 mg of potassium   Watermelon -  cup has 85 mg of potassium  Wine (Red, table) - 5 oz has 180 mg of potassium  Wine (White, table) - 5 oz 100 mg of potassium Low in potassium The following foods and beverages have less than 50 mg of potassium per serving.  Bread (white) - 1 slice has 30 mg of potassium  Carbonated beverages - 12 oz has less than 5 mg of potassium  Cheese - 1 oz has 20-30 mg of potassium  Cranberries -  cup has 45 mg of potassium  Cranberry juice cocktail -  cup has 20 mg of potassium  Fats and oils - 1 Tbsp has less than 5 mg of potassium  Hummus - 1 Tbsp has 32 mg of potassium  Nectar (papaya, mango, or pear) -  cup has 35 mg of potassium  Rice (white or brown) -  cup has 50 mg of potassium  Spaghetti or macaroni (cooked) -  cup has 30 mg of potassium  Tortilla, flour or corn - 1 has 50 mg of potassium  Waffle - 1 four-inch waffle has 50 mg of potassium  Water chestnuts -  cup has 40 mg of potassium Summary  Potassium is a mineral found in many foods and drinks. It affects how the heart works, and helps keep fluids and minerals balanced  in the body.  The amount of potassium you need each day depends on your age and any existing medical conditions you may have. Your health care provider or dietitian may recommend an amount of potassium that you should have each day. This information is not intended to replace advice given to you by your health care provider. Make sure you discuss any questions you have with your health care provider. Document Released: 10/31/2004 Document Revised: 06/13/2016 Document Reviewed: 06/13/2016 Elsevier Interactive Patient Education  2019 [employer].

## 2018-09-23 NOTE — Assessment & Plan Note (Signed)
Recheck labs next month

## 2018-09-23 NOTE — Assessment & Plan Note (Signed)
Per neuro Will get HH to come to the house and have PT and safety evaluation  rx for walker given as well

## 2018-09-23 NOTE — Assessment & Plan Note (Signed)
Pt states he was a little stressed , getting here today Will recheck bp when he comes in for labs next month

## 2018-09-26 ENCOUNTER — Telehealth: Payer: Self-pay | Admitting: Family Medicine

## 2018-09-26 DIAGNOSIS — K219 Gastro-esophageal reflux disease without esophagitis: Secondary | ICD-10-CM | POA: Diagnosis not present

## 2018-09-26 DIAGNOSIS — D649 Anemia, unspecified: Secondary | ICD-10-CM | POA: Diagnosis not present

## 2018-09-26 DIAGNOSIS — E039 Hypothyroidism, unspecified: Secondary | ICD-10-CM | POA: Diagnosis not present

## 2018-09-26 DIAGNOSIS — N400 Enlarged prostate without lower urinary tract symptoms: Secondary | ICD-10-CM | POA: Diagnosis not present

## 2018-09-26 DIAGNOSIS — M1990 Unspecified osteoarthritis, unspecified site: Secondary | ICD-10-CM | POA: Diagnosis not present

## 2018-09-26 DIAGNOSIS — Z87891 Personal history of nicotine dependence: Secondary | ICD-10-CM | POA: Diagnosis not present

## 2018-09-26 DIAGNOSIS — H9209 Otalgia, unspecified ear: Secondary | ICD-10-CM | POA: Diagnosis not present

## 2018-09-26 DIAGNOSIS — R600 Localized edema: Secondary | ICD-10-CM | POA: Diagnosis not present

## 2018-09-26 DIAGNOSIS — G20 Parkinson's disease: Secondary | ICD-10-CM | POA: Diagnosis not present

## 2018-09-26 DIAGNOSIS — E785 Hyperlipidemia, unspecified: Secondary | ICD-10-CM | POA: Diagnosis not present

## 2018-09-26 DIAGNOSIS — E7800 Pure hypercholesterolemia, unspecified: Secondary | ICD-10-CM | POA: Diagnosis not present

## 2018-09-26 DIAGNOSIS — H9190 Unspecified hearing loss, unspecified ear: Secondary | ICD-10-CM | POA: Diagnosis not present

## 2018-09-26 DIAGNOSIS — I10 Essential (primary) hypertension: Secondary | ICD-10-CM | POA: Diagnosis not present

## 2018-09-26 DIAGNOSIS — E559 Vitamin D deficiency, unspecified: Secondary | ICD-10-CM | POA: Diagnosis not present

## 2018-09-26 NOTE — Telephone Encounter (Signed)
[place] Number: [phone] ok to leave verbal on VM Requesting OT/PT/Skilled Nursing/Social Work/Speech Therapy: Additional PT Frequency: 2week 1, 1 week 2, 1 every week 2

## 2018-09-26 NOTE — Telephone Encounter (Signed)
Spoke w/ Zenia Resides- verbal orders given.

## 2018-09-26 NOTE — Telephone Encounter (Signed)
Caller/Agency: [facility] Callback Number: [phone] ok to leave verbal on VM Requesting OT/PT/Skilled Nursing/Social Work/Speech Therapy: Additional PT Frequency: 2week 1, 1 week 2, 1 every week 2

## 2018-09-28 ENCOUNTER — Other Ambulatory Visit: Payer: Self-pay | Admitting: Family Medicine

## 2018-09-28 ENCOUNTER — Other Ambulatory Visit: Payer: Self-pay | Admitting: Neurology

## 2018-09-28 DIAGNOSIS — E785 Hyperlipidemia, unspecified: Secondary | ICD-10-CM

## 2018-09-29 NOTE — Telephone Encounter (Signed)
Requested Prescriptions   Pending Prescriptions Disp Refills  . carbidopa-levodopa (SINEMET IR) 25-100 MG tablet [Pharmacy Med Name: Carbidopa-Levodopa 25-100 MG Oral Tablet] 540 tablet 0    Sig: TAKE 2 TABLETS BY MOUTH THREE TIMES DAILY   Rx last filled:07/28/18 #540 0 refills  Pt last seen:01/15/18  Follow up appt scheduled:none

## 2018-10-06 ENCOUNTER — Telehealth: Payer: Self-pay

## 2018-10-06 LAB — ABI

## 2018-10-06 NOTE — Telephone Encounter (Signed)
Copied from [place] [phone]. Topic: General - Other >> Oct 02, 2018  3:57 PM Mcneil, Ja-Kwan wrote: Reason for CRM: Merrilee Seashore with [facility] stated he went out to visit the pt earlier this week but when he attempted to schedule an appt pt would not allow him to schedule. Merrilee Seashore stated he tried to reach the pt today but pt did not answer so he was unable to have visit with pt. Cb# [phone]

## 2018-10-21 ENCOUNTER — Other Ambulatory Visit: Payer: Self-pay

## 2018-10-21 ENCOUNTER — Other Ambulatory Visit (INDEPENDENT_AMBULATORY_CARE_PROVIDER_SITE_OTHER): Payer: PPO

## 2018-10-21 DIAGNOSIS — E039 Hypothyroidism, unspecified: Secondary | ICD-10-CM | POA: Diagnosis not present

## 2018-10-22 LAB — THYROID PANEL WITH TSH
Free Thyroxine Index: 3.3 (ref 1.4–3.8)
T3 Uptake: 37 % — ABNORMAL HIGH (ref 22–35)
T4, Total: 8.8 ug/dL (ref 4.9–10.5)
TSH: 0.24 mIU/L — ABNORMAL LOW (ref 0.40–4.50)

## 2018-10-24 DIAGNOSIS — L820 Inflamed seborrheic keratosis: Secondary | ICD-10-CM | POA: Diagnosis not present

## 2018-10-24 DIAGNOSIS — X32XXXD Exposure to sunlight, subsequent encounter: Secondary | ICD-10-CM | POA: Diagnosis not present

## 2018-10-24 DIAGNOSIS — L570 Actinic keratosis: Secondary | ICD-10-CM | POA: Diagnosis not present

## 2018-10-26 ENCOUNTER — Other Ambulatory Visit: Payer: Self-pay | Admitting: Family Medicine

## 2018-10-26 DIAGNOSIS — E039 Hypothyroidism, unspecified: Secondary | ICD-10-CM

## 2018-10-27 ENCOUNTER — Telehealth: Payer: Self-pay

## 2018-10-27 MED ORDER — LEVOTHYROXINE SODIUM 100 MCG PO TABS: 100.0000 ug | ORAL_TABLET | Freq: Every day | ORAL | 2 refills | Status: DC

## 2018-10-27 NOTE — Telephone Encounter (Signed)
Left message regarding thyroid and decreasing medication. New Rx sent.

## 2018-10-30 DIAGNOSIS — H6122 Impacted cerumen, left ear: Secondary | ICD-10-CM | POA: Diagnosis not present

## 2018-10-30 DIAGNOSIS — H9203 Otalgia, bilateral: Secondary | ICD-10-CM | POA: Diagnosis not present

## 2018-10-30 DIAGNOSIS — H903 Sensorineural hearing loss, bilateral: Secondary | ICD-10-CM | POA: Diagnosis not present

## 2018-10-30 DIAGNOSIS — M26623 Arthralgia of bilateral temporomandibular joint: Secondary | ICD-10-CM | POA: Diagnosis not present

## 2018-10-30 DIAGNOSIS — Z87891 Personal history of nicotine dependence: Secondary | ICD-10-CM | POA: Diagnosis not present

## 2018-10-30 DIAGNOSIS — Z974 Presence of external hearing-aid: Secondary | ICD-10-CM | POA: Diagnosis not present

## 2018-11-06 DIAGNOSIS — R972 Elevated prostate specific antigen [PSA]: Secondary | ICD-10-CM | POA: Diagnosis not present

## 2018-11-06 DIAGNOSIS — N401 Enlarged prostate with lower urinary tract symptoms: Secondary | ICD-10-CM | POA: Diagnosis not present

## 2018-11-06 DIAGNOSIS — R3915 Urgency of urination: Secondary | ICD-10-CM | POA: Diagnosis not present

## 2018-11-10 ENCOUNTER — Other Ambulatory Visit (INDEPENDENT_AMBULATORY_CARE_PROVIDER_SITE_OTHER): Payer: PPO

## 2018-11-10 ENCOUNTER — Other Ambulatory Visit: Payer: Self-pay

## 2018-11-10 DIAGNOSIS — E039 Hypothyroidism, unspecified: Secondary | ICD-10-CM

## 2018-11-10 LAB — TSH: TSH: 0.07 u[IU]/mL — ABNORMAL LOW (ref 0.35–4.50)

## 2018-11-17 ENCOUNTER — Other Ambulatory Visit: Payer: Self-pay

## 2018-11-17 MED ORDER — LEVOTHYROXINE SODIUM 88 MCG PO TABS: 88.0000 ug | ORAL_TABLET | Freq: Every day | ORAL | 1 refills | Status: DC

## 2018-12-29 ENCOUNTER — Other Ambulatory Visit: Payer: Self-pay

## 2018-12-30 ENCOUNTER — Ambulatory Visit (INDEPENDENT_AMBULATORY_CARE_PROVIDER_SITE_OTHER): Payer: PPO | Admitting: Family Medicine

## 2018-12-30 ENCOUNTER — Encounter: Payer: Self-pay | Admitting: Family Medicine

## 2018-12-30 ENCOUNTER — Other Ambulatory Visit: Payer: Self-pay

## 2018-12-30 ENCOUNTER — Other Ambulatory Visit (INDEPENDENT_AMBULATORY_CARE_PROVIDER_SITE_OTHER): Payer: PPO

## 2018-12-30 VITALS — BP 170/90 | HR 77 | Temp 97.0°F | Resp 18 | Ht 73.0 in | Wt 177.6 lb

## 2018-12-30 DIAGNOSIS — E039 Hypothyroidism, unspecified: Secondary | ICD-10-CM

## 2018-12-30 DIAGNOSIS — R030 Elevated blood-pressure reading, without diagnosis of hypertension: Secondary | ICD-10-CM | POA: Insufficient documentation

## 2018-12-30 DIAGNOSIS — R600 Localized edema: Secondary | ICD-10-CM

## 2018-12-30 DIAGNOSIS — Z23 Encounter for immunization: Secondary | ICD-10-CM | POA: Diagnosis not present

## 2018-12-30 LAB — LIPID PANEL
Cholesterol: 148 mg/dL (ref 0–200)
HDL: 42.4 mg/dL (ref >39.00)
LDL Cholesterol: 81 mg/dL (ref 0–99)
NonHDL: 105.65
Total CHOL/HDL Ratio: 3
Triglycerides: 124 mg/dL (ref 0.0–149.0)
VLDL: 24.8 mg/dL (ref 0.0–40.0)

## 2018-12-30 LAB — COMPREHENSIVE METABOLIC PANEL
ALT: 4 U/L (ref 0–53)
AST: 18 U/L (ref 0–37)
Albumin: 4.3 g/dL (ref 3.5–5.2)
Alkaline Phosphatase: 48 U/L (ref 39–117)
BUN: 15 mg/dL (ref 6–23)
CO2: 31 mEq/L (ref 19–32)
Calcium: 9.3 mg/dL (ref 8.4–10.5)
Chloride: 101 mEq/L (ref 96–112)
Creatinine, Ser: 1.03 mg/dL (ref 0.40–1.50)
GFR: 68.84 mL/min (ref >60.00)
Glucose, Bld: 87 mg/dL (ref 70–99)
Potassium: 4.3 mEq/L (ref 3.5–5.1)
Sodium: 137 mEq/L (ref 135–145)
Total Bilirubin: 0.6 mg/dL (ref 0.2–1.2)
Total Protein: 6.7 g/dL (ref 6.0–8.3)

## 2018-12-30 LAB — TSH: TSH: 3.87 u[IU]/mL (ref 0.35–4.50)

## 2018-12-30 MED ORDER — FUROSEMIDE 20 MG PO TABS: 20.0000 mg | ORAL_TABLET | Freq: Every day | ORAL | 3 refills | Status: DC

## 2018-12-30 NOTE — Assessment & Plan Note (Signed)
Pt states it is only high here He will check his bp more often at home and let us know his home readings

## 2018-12-30 NOTE — Assessment & Plan Note (Signed)
Elevated legs D/c hctz--- start lasix 20 mg qd Reminded pt to eat/ drink something high in K

## 2018-12-30 NOTE — Progress Notes (Signed)
Patient ID: William Williamson, male    DOB: [birth year]  Age: 85 y.o. MRN: [id number]    Subjective:  Subjective  HPI William Williamson presents for f/u lipids and low ext edema.  The hctz did nothing so he stopped taking it  He is also requesting a flu shot   Review of Systems  Constitutional: Negative for appetite change, diaphoresis, fatigue and unexpected weight change.  Eyes: Negative for pain, redness and visual disturbance.  Respiratory: Negative for cough, chest tightness, shortness of breath and wheezing.   Cardiovascular: Positive for leg swelling. Negative for chest pain and palpitations.  Endocrine: Negative for cold intolerance, heat intolerance, polydipsia, polyphagia and polyuria.  Genitourinary: Negative for difficulty urinating, dysuria and frequency.  Musculoskeletal: Positive for gait problem.  Neurological: Positive for tremors and weakness. Negative for dizziness, light-headedness, numbness and headaches.    History Past Medical History:  Diagnosis Date  . Anemia 1952   post Oromycin for [place]mia  . Anisocoria    post op  . Arthritis   . Autonomic dysfunction   . GERD (gastroesophageal reflux disease)   . Glaucoma    Dr Bing Plume  . Hearing loss in right ear   . Hypercholesteremia   . Hyperthyroidism    s/p RAI  . LVH (left ventricular hypertrophy)   . Orthostatic hypotension   . Parkinson's disease [facility])     He has a past surgical history that includes Septoplasty (1970); Cardiac catheterization (07/2006); Vasectomy; RAI ablation (02/2008); Cataract extraction, bilateral; and Colonoscopy.   His family history includes Diabetes in his brother; Heart attack (age of onset: 48) in his maternal grandfather; Hypertension in his brother and sister; Osteoporosis in his mother; Pancreatic cancer in his brother; Prostate cancer in his brother; Stomach cancer in his maternal grandmother.He reports that he quit smoking about 50 years ago. He has never used  smokeless tobacco. He reports current alcohol use of about 6.0 standard drinks of alcohol per week. He reports that he does not use drugs.  Current Outpatient Medications on File Prior to Visit  Medication Sig Dispense Refill  . carbidopa-levodopa (SINEMET IR) 25-100 MG tablet TAKE 2 TABLETS BY MOUTH THREE TIMES DAILY 540 tablet 0  . Cholecalciferol 2000 UNITS CAPS Take 1 capsule by mouth daily.     Marland Kitchen docusate sodium (COLACE) 100 MG capsule Take 200 mg by mouth daily as needed for mild constipation.    . dorzolamide-timolol (COSOPT) 22.3-6.8 MG/ML ophthalmic solution Place 1 drop into both eyes 2 (two) times daily.     Marland Kitchen latanoprost (XALATAN) 0.005 % ophthalmic solution Place 1 drop into both eyes at bedtime.     Marland Kitchen levothyroxine (SYNTHROID) 88 MCG tablet Take 1 tablet (88 mcg total) by mouth daily. 90 tablet 1  . NONFORMULARY OR COMPOUNDED ITEM Walker  #1   Dx parkinson disease 1 each 0  . polyethylene glycol (MIRALAX / GLYCOLAX) packet Take 17 g by mouth daily as needed.     . simvastatin (ZOCOR) 20 MG tablet TAKE 1 TABLET BY MOUTH ONCE DAILY IN THE EVENING 90 tablet 0   No current facility-administered medications on file prior to visit.      Objective:  Objective  Physical Exam Vitals signs and nursing note reviewed.  Constitutional:      General: He is sleeping.     Appearance: He is well-developed.  HENT:     Head: Normocephalic and atraumatic.  Eyes:     Pupils: Pupils are equal, round, and reactive to  light.  Neck:     Musculoskeletal: Normal range of motion and neck supple.     Thyroid: No thyromegaly.  Cardiovascular:     Rate and Rhythm: Normal rate and regular rhythm.     Heart sounds: No murmur.  Pulmonary:     Effort: Pulmonary effort is normal. No respiratory distress.     Breath sounds: Normal breath sounds. No wheezing or rales.  Chest:     Chest wall: No tenderness.  Musculoskeletal:        General: No tenderness.     Right lower leg: Edema present.     Left  lower leg: Edema present.  Skin:    General: Skin is warm and dry.  Neurological:     Mental Status: He is oriented to person, place, and time.  Psychiatric:        Behavior: Behavior normal.        Thought Content: Thought content normal.        Judgment: Judgment normal.    BP (!) 170/90 (BP Location: Right Arm, Patient Position: Sitting, Cuff Size: Normal)   Pulse 77   Temp (!) 97 F (36.1 C) (Temporal)   Resp 18   Ht 6\' 1"  (1.854 m)   Wt 177 lb 9.6 oz (80.6 kg)   SpO2 97%   BMI 23.43 kg/m  Wt Readings from Last 3 Encounters:  12/30/18 177 lb 9.6 oz (80.6 kg)  09/23/18 173 lb (78.5 kg)  09/09/18 176 lb 12.8 oz (80.2 kg)     Lab Results  Component Value Date   WBC 7.7 08/22/2017   HGB 14.4 08/22/2017   HCT 42.6 08/22/2017   PLT 200.0 08/22/2017   GLUCOSE 93 09/23/2018   CHOL 124 09/03/2018   TRIG 84.0 09/03/2018   HDL 40.50 09/03/2018   LDLCALC 67 09/03/2018   ALT 2 09/03/2018   AST 15 09/03/2018   NA 131 (L) 09/23/2018   K 4.3 09/23/2018   CL 92 (L) 09/23/2018   CREATININE 0.99 09/23/2018   BUN 20 09/23/2018   CO2 32 09/23/2018   TSH 0.07 (L) 11/10/2018   PSA 4.85 (H) 08/16/2016   INR 0.9 RATIO 07/26/2006   HGBA1C 5.9 10/09/2006    Dg Op Swallowing Func-medicare/speech Path  Result Date: 03/05/2018 Objective Swallowing Evaluation: Type of Study: MBS-Modified Barium Swallow Study  Patient Details Name: William Williamson MRN: [id number] Date of Birth: 12-27-1932 Today's Date: 03/05/2018 Time: SLP Start Time (ACUTE ONLY): [id number] -SLP Stop Time (ACUTE ONLY): 1410 SLP Time Calculation (min) (ACUTE ONLY): 42 min Past Medical History: Past Medical History: Diagnosis Date . Anemia 1952  post Oromycin for [place] . Anisocoria   post op . Arthritis  . Autonomic dysfunction  . GERD (gastroesophageal reflux disease)  . Glaucoma   Dr Bing Plume . Hearing loss in right ear  . Hypercholesteremia  . Hyperthyroidism   s/p RAI . LVH (left ventricular hypertrophy)  . Orthostatic  hypotension  . Parkinson's disease [facility])  Past Surgical History: Past Surgical History: Procedure Laterality Date . CARDIAC CATHETERIZATION  07/2006  Dr.  Eustace Quail, negative . CATARACT EXTRACTION, BILATERAL    OS retinal surgery for post op floaters . COLONOSCOPY    negative X 3 . RAI ablation  02/2008  hyperthyroidism . SEPTOPLASTY  1970 . VASECTOMY   HPI: 85 yo gentleman referred for MBS by Dr Etter Sjogren due to pt report of coughing with intake - He reports most difficulties with solids requiring him to cough and at  times expectorate solids mixed with secretions.  He states this mostly occurs immediately after eating.  Reports difficulties with nuts, oranges, etc.  Pt denies weight loss, pneumonias nor requiring heimlich manuever.  He has not had prior swallow studies.  H/o GERD but pt states this "went away".  Pt also reports he frequently drools.   Subjective: pt awake sitting upright in chair, notable excessive upper body movement due to his Parkinsons Assessment / Plan / Recommendation CHL IP CLINICAL IMPRESSIONS 03/05/2018 Clinical Impression Patient presents with mild oral and moderate pharyngeal dysphagia with sensorimotor deficits.  Impaired pharyngeal contraction, tongue base retraction and laryngeal elevation results in residuals across all consistencies.  Various postures including head turn left *to pt's purported weak side* with and without chin tuck with thin and nectar did not prevent accumulation of barium and resulted in trace laryngeal penetration of thin as barium spilled from pyriform into open larynx.  He did NOT aspirate and trace penetrates cleared with further swallow.  Pt does not always sense residuals and pharyngeal residuals were not worse with solids compared to liquids.   SLP taxed pt in effort to try to get him to aspirate with sequential boluses of thin (7) but he did not.  Dry swallows *reflexive and cued* help to decrease residuals.   Cued "hock" helpful for pt to expel vallecular  residuals and advised pt to use this strategy during meals to assure vallecular clearance.  Pt able to transit and swallow barium tablet with thin without difficulty.  Upon esophageal sweep, esophagus appeared clear but this test is not intended to evaluate esophageal function.  Advised pt to start meals with liquids *prefer water*, conduct multiple dry swallows per bolus, masticate thoroughly, conduct effortful swallow, follow solids with liquids, intermittent "hock".  Also provided him with lingual press and effortful swallow exercise to facilitate pharyngeal contraction.  Advised he use caution with foods that cause difficulties like oranges, nuts - and he may need to avoid these due to increased pulmonary risk if aspirating solids.  Please consider referral for OP for pt to undergo Respiratory Muscle Strength Training *RMST* to facilitate laryngeal closure, voice and cough strength for maximal swallow/voice benefits.  Using teach back and written instructions pt educated.  Thanks for this referral.   SLP Visit Diagnosis Dysphagia, oropharyngeal phase (R13.12);Dysphagia, pharyngoesophageal phase (R13.14) Attention and concentration deficit following -- Frontal lobe and executive function deficit following -- Impact on safety and function Moderate aspiration risk   No flowsheet data found.  No flowsheet data found. CHL IP DIET RECOMMENDATION 03/05/2018 SLP Diet Recommendations Regular solids;Thin liquid Liquid Administration via Cup;Straw Medication Administration Other ( as tolerated, with pudding if problematic Compensations Slow rate;Small sips/bites;Multiple dry swallows after each bite/sip;Follow solids with liquid;Effortful swallow, intermittent "hock" Postural Changes Remain semi-upright after after feeds/meals (Comment);Seated upright at 90 degrees   CHL IP OTHER RECOMMENDATIONS 03/05/2018 Recommended Consults OP therapy for feeding Oral Care Recommendations Oral care BID Other Recommendations --   CHL IP  FOLLOW UP RECOMMENDATIONS 03/05/2018 Follow up Recommendations Outpatient SLP   No flowsheet data found.     CHL IP ORAL PHASE 03/05/2018 Oral Phase Impaired Oral - Pudding Teaspoon -- Oral - Pudding Cup -- Oral - Honey Teaspoon -- Oral - Honey Cup -- Oral - Nectar Teaspoon -- Oral - Nectar Cup Reduced posterior propulsion Oral - Nectar Straw Reduced posterior propulsion Oral - Thin Teaspoon Lingual pumping Oral - Thin Cup Reduced posterior propulsion Oral - Thin Straw Reduced posterior propulsion Oral -  Puree WFL;Lingual pumping Oral - Mech Soft -- Oral - Regular WFL Oral - Multi-Consistency -- Oral - Pill WFL Oral Phase - Comment lingual pumping with thin via tsp and puree, swallow appears effortful with increased visocity  CHL IP PHARYNGEAL PHASE 03/05/2018 Pharyngeal Phase Impaired Pharyngeal- Pudding Teaspoon -- Pharyngeal -- Pharyngeal- Pudding Cup -- Pharyngeal -- Pharyngeal- Honey Teaspoon -- Pharyngeal -- Pharyngeal- Honey Cup -- Pharyngeal -- Pharyngeal- Nectar Teaspoon -- Pharyngeal -- Pharyngeal- Nectar Cup Reduced pharyngeal peristalsis;Reduced epiglottic inversion;Reduced laryngeal elevation;Reduced airway/laryngeal closure;Reduced tongue base retraction;Pharyngeal residue - valleculae;Pharyngeal residue - pyriform Pharyngeal -- Pharyngeal- Nectar Straw Reduced pharyngeal peristalsis;Reduced epiglottic inversion;Reduced laryngeal elevation;Reduced airway/laryngeal closure;Reduced tongue base retraction;Pharyngeal residue - valleculae;Pharyngeal residue - pyriform Pharyngeal -- Pharyngeal- Thin Teaspoon Reduced pharyngeal peristalsis;Reduced epiglottic inversion;Reduced laryngeal elevation;Reduced airway/laryngeal closure;Reduced tongue base retraction;Pharyngeal residue - valleculae;Pharyngeal residue - pyriform Pharyngeal -- Pharyngeal- Thin Cup Reduced pharyngeal peristalsis;Reduced epiglottic inversion;Reduced laryngeal elevation;Reduced airway/laryngeal closure;Reduced tongue base  retraction;Pharyngeal residue - valleculae;Pharyngeal residue - pyriform Pharyngeal -- Pharyngeal- Thin Straw Reduced pharyngeal peristalsis;Reduced epiglottic inversion;Reduced laryngeal elevation;Reduced airway/laryngeal closure;Reduced tongue base retraction;Penetration/Aspiration during swallow;Pharyngeal residue - valleculae;Pharyngeal residue - pyriform Pharyngeal Material enters airway, remains ABOVE vocal cords then ejected out Pharyngeal- Puree Reduced pharyngeal peristalsis;Reduced epiglottic inversion;Pharyngeal residue - valleculae;Pharyngeal residue - pyriform Pharyngeal -- Pharyngeal- Mechanical Soft -- Pharyngeal -- Pharyngeal- Regular Reduced pharyngeal peristalsis;Reduced epiglottic inversion;Pharyngeal residue - valleculae;Pharyngeal residue - pyriform Pharyngeal -- Pharyngeal- Multi-consistency -- Pharyngeal -- Pharyngeal- Pill Reduced pharyngeal peristalsis;Reduced epiglottic inversion;Reduced laryngeal elevation;Reduced airway/laryngeal closure;Reduced tongue base retraction;Pharyngeal residue - valleculae;Pharyngeal residue - pyriform Pharyngeal -- Pharyngeal Comment various postures, head turn left *to pt's reported weak side where he "drag his leg" with and without chin tuck not helpful to prevent residual accumulation and actually allowed TRACE penetration of thin due to spillage from pyriform sinus into airway  CHL IP CERVICAL ESOPHAGEAL PHASE 03/05/2018 Cervical Esophageal Phase Impaired Pudding Teaspoon -- Pudding Cup -- Honey Teaspoon -- Honey Cup -- Nectar Teaspoon -- Nectar Cup -- Nectar Straw -- Thin Teaspoon -- Thin Cup -- Thin Straw -- Puree -- Mechanical Soft -- Regular -- Multi-consistency -- Pill -- Cervical Esophageal Comment decreased clearance into esophagus resulting in pyriform sinus residuals William Williamson 03/05/2018, 4:13 PM      Luanna Salk, MS [facility] SLP Acute Rehab Services Pager [phone] Office [phone]       CLINICAL DATA:  Dysphagia. EXAM: MODIFIED BARIUM  SWALLOW TECHNIQUE: Different consistencies of barium were administered orally to the patient by the Speech Pathologist. Imaging of the pharynx was performed in the lateral projection. FLUOROSCOPY TIME:  Fluoroscopy Time:  1 minutes 42 seconds Radiation Exposure Index (if provided by the fluoroscopic device): 8.2 MGy Number of Acquired Spot Images: 0 COMPARISON:  None. FINDINGS: Thin liquid- within normal limits Nectar thick liquid- within normal limits Honey- within normal limits Pure- within normal limits Cracker-within normal limits Pure with cracker- within normal limits Barium tablet -  within normal limits Pooling of contrast within the hypopharynx was noted with all consistencies. IMPRESSION: Pooling of contrast within the hypopharynx, otherwise no evidence of laryngeal penetration or tracheal aspiration. Please refer to the Speech Pathologists report for complete details and recommendations. Electronically Signed   By: Fidela Salisbury M.D.   On: 03/05/2018 14:30     Assessment & Plan:  Plan  I have discontinued William Williamson's hydrochlorothiazide. I am also having him start on furosemide. Additionally, I am having him maintain his Cholecalciferol, dorzolamide-timolol, polyethylene glycol, latanoprost, docusate sodium, NONFORMULARY OR COMPOUNDED ITEM, simvastatin,  carbidopa-levodopa, and levothyroxine.  Meds ordered this encounter  Medications  . furosemide (LASIX) 20 MG tablet    Sig: Take 1 tablet (20 mg total) by mouth daily.    Dispense:  30 tablet    Refill:  3    Problem List Items Addressed This Visit      Unprioritized   Elevated BP without diagnosis of hypertension    Pt states it is only high here He will check his bp more often at home and let us know his home readings       Relevant Orders   Lipid panel   Comprehensive metabolic panel   Hypothyroidism   Relevant Orders   TSH   Lower extremity edema - Primary    Elevated legs D/c hctz--- start lasix 20 mg  qd Reminded pt to eat/ drink something high in K       Relevant Medications   furosemide (LASIX) 20 MG tablet   Other Relevant Orders   Lipid panel   Comprehensive metabolic panel    Other Visit Diagnoses    Need for influenza vaccination       Relevant Orders   Flu Vaccine QUAD High Dose(Fluad) (Completed)      Follow-up: Return as scheduled, for hypertension, hyperlipidemia, annual exam, fasting.  [name], DO

## 2018-12-30 NOTE — Patient Instructions (Signed)

## 2019-01-05 DIAGNOSIS — R3915 Urgency of urination: Secondary | ICD-10-CM | POA: Diagnosis not present

## 2019-01-05 DIAGNOSIS — N401 Enlarged prostate with lower urinary tract symptoms: Secondary | ICD-10-CM | POA: Diagnosis not present

## 2019-02-17 ENCOUNTER — Telehealth: Payer: Self-pay | Admitting: *Deleted

## 2019-02-17 DIAGNOSIS — E039 Hypothyroidism, unspecified: Secondary | ICD-10-CM

## 2019-02-17 NOTE — Telephone Encounter (Signed)
Copied from [place] [id number]. Topic: General - Inquiry >> Feb 17, 2019 11:02 AM Mathis Bud wrote: Reason for CRM: mary from [facility] called regarding medication levothyroxine (SYNTHROID) 88 MCG tablet  [facility] states they're changing there manufacture on medication and it can change thyroid levels. Pharmacy is requesting a call back  Call back [phone]

## 2019-02-19 NOTE — Telephone Encounter (Signed)
Advise that ok to change and have patient to schedule 2 month lab only visit for recheck tsh

## 2019-02-19 NOTE — Addendum Note (Signed)
Addended by: Kem Boroughs D on: 02/19/2019 03:49 PM   Modules accepted: Orders

## 2019-03-02 ENCOUNTER — Ambulatory Visit: Payer: PPO | Admitting: *Deleted

## 2019-03-02 ENCOUNTER — Encounter: Payer: PPO | Admitting: Family Medicine

## 2019-03-02 ENCOUNTER — Other Ambulatory Visit: Payer: Self-pay

## 2019-03-02 DIAGNOSIS — H401132 Primary open-angle glaucoma, bilateral, moderate stage: Secondary | ICD-10-CM | POA: Diagnosis not present

## 2019-03-02 NOTE — Progress Notes (Deleted)
Subjective:   William Williamson is a 85 y.o. male who presents for Medicare Annual/Subsequent preventive examination.  Review of Systems:      Home Safety/Smoke Alarms: Feels safe in home. Smoke alarms in place.  Lives alone in 1 story home w/ basement. Son stays with him some. Walk in shower.  Male:   CCS-  No longer doing routine screening due to age.    PSA-  Lab Results  Component Value Date   PSA 4.85 (H) 08/16/2016   PSA 4.01 (H) 06/23/2015   PSA 5.11 (H) 06/19/2013       Objective:    Vitals: There were no vitals taken for this visit.  There is no height or weight on file to calculate BMI.  Advanced Directives 08/19/2017 08/16/2016 06/23/2015 10/12/2014  Does Patient Have a Medical Advance Directive? Yes Yes Yes Yes  Type of Industrial/product designer of [employer] Power of [place]  Does patient want to make changes to medical advance directive? - No - Patient declined No - Patient declined -  Copy of [place] in Chart? Yes No - copy requested No - copy requested -    Tobacco Social History   Tobacco Use  Smoking Status Former Smoker  . Quit date: 04/02/1968  . Years since quitting: 50.9  Smokeless Tobacco Never Used     Counseling given: Not Answered   Clinical Intake:                       Past Medical History:  Diagnosis Date  . Anemia 1952   post Oromycin for [place]  . Anisocoria    post op  . Arthritis   . Autonomic dysfunction   . GERD (gastroesophageal reflux disease)   . Glaucoma    Dr Bing Plume  . Hearing loss in right ear   . Hypercholesteremia   . Hyperthyroidism    s/p RAI  . LVH (left ventricular hypertrophy)   . Orthostatic hypotension   . Parkinson's disease [facility])    Past Surgical History:  Procedure Laterality Date  . CARDIAC CATHETERIZATION  07/2006   Dr.  Eustace Quail, negative  . CATARACT EXTRACTION, BILATERAL     OS  retinal surgery for post op floaters  . COLONOSCOPY     negative X 3  . RAI ablation  02/2008   hyperthyroidism  . SEPTOPLASTY  1970  . VASECTOMY     Family History  Problem Relation Age of Onset  . Osteoporosis Mother   . Hypertension Sister   . Hypertension Brother         X 3  . Diabetes Brother   . Prostate cancer Brother   . Stomach cancer Maternal Grandmother   . Pancreatic cancer Brother        ???  . Heart attack Maternal Grandfather 45  . Stroke Neg Hx    Social History   Socioeconomic History  . Marital status: Widowed    Spouse name: Not on file  . Number of children: Not on file  . Years of education: Not on file  . Highest education level: Not on file  Occupational History  . Occupation: Retired    Comment: Dentist Needs  . Financial resource strain: Not on file  . Food insecurity    Worry: Not on file    Inability: Not on file  . Transportation needs  Medical: Not on file    Non-medical: Not on file  Tobacco Use  . Smoking status: Former Smoker    Quit date: 04/02/1968    Years since quitting: 50.9  . Smokeless tobacco: Never Used  Substance and Sexual Activity  . Alcohol use: Yes    Alcohol/week: 6.0 standard drinks    Types: 6 Cans of beer per week  . Drug use: No  . Sexual activity: Not on file  Lifestyle  . Physical activity    Days per week: Not on file    Minutes per session: Not on file  . Stress: Not on file  Relationships  . Social Herbalist on phone: Not on file    Gets together: Not on file    Attends religious service: Not on file    Active member of club or organization: Not on file    Attends meetings of clubs or organizations: Not on file    Relationship status: Not on file  Other Topics Concern  . Not on file  Social History Narrative   Son William Williamson of [place]    Outpatient Encounter Medications as of 03/03/2019  Medication Sig  . carbidopa-levodopa (SINEMET IR) 25-100 MG tablet TAKE 2  TABLETS BY MOUTH THREE TIMES DAILY  . Cholecalciferol 2000 UNITS CAPS Take 1 capsule by mouth daily.   Marland Kitchen docusate sodium (COLACE) 100 MG capsule Take 200 mg by mouth daily as needed for mild constipation.  . dorzolamide-timolol (COSOPT) 22.3-6.8 MG/ML ophthalmic solution Place 1 drop into both eyes 2 (two) times daily.   . furosemide (LASIX) 20 MG tablet Take 1 tablet (20 mg total) by mouth daily.  Marland Kitchen latanoprost (XALATAN) 0.005 % ophthalmic solution Place 1 drop into both eyes at bedtime.   Marland Kitchen levothyroxine (SYNTHROID) 88 MCG tablet Take 1 tablet (88 mcg total) by mouth daily.  . NONFORMULARY OR COMPOUNDED ITEM Walker  #1   Dx parkinson disease  . polyethylene glycol (MIRALAX / GLYCOLAX) packet Take 17 g by mouth daily as needed.   . simvastatin (ZOCOR) 20 MG tablet TAKE 1 TABLET BY MOUTH ONCE DAILY IN THE EVENING   No facility-administered encounter medications on file as of 03/03/2019.     Activities of Daily Living No flowsheet data found.  Patient Care Team: Carollee Herter, Alferd Apa, DO as PCP - General (Family Medicine) Dorothy Spark, MD as PCP - Cardiology (Cardiology) Allyn Kenner, MD as Consulting Physician (Dermatology) Tat, Eustace Quail, DO as Consulting Physician (Neurology) Festus Aloe, MD as Consulting Physician (Urology) Dorothy Spark, MD as Consulting Physician (Cardiology) Daneen Schick, DDS as Consulting Physician (Dentistry)   Assessment:   This is a routine wellness examination for William Williamson. Physical assessment deferred to PCP.  Exercise Activities and Dietary recommendations   Diet (meal preparation, eat out, water intake, caffeinated beverages, dairy products, fruits and vegetables): {Desc; diets:16563} Breakfast: Lunch:  Dinner:      Goals    . Maintain current exercise regimen of walking 2 miles daily.    . Maintain independence       Fall Risk Fall Risk  01/15/2018 08/19/2017 08/08/2017 02/12/2017 09/11/2016  Falls in the past year? Yes Yes Yes No No   Number falls in past yr: 1 2 or more 2 or more - -  Injury with Fall? No No No - -  Risk Factor Category  - High Fall Risk High Fall Risk - -  Risk for fall due to : - Impaired balance/gait - - -  Follow up Falls evaluation completed Education provided;Falls prevention discussed Falls evaluation completed - -   Depression Screen PHQ 2/9 Scores 08/22/2017 08/19/2017 08/16/2016 01/17/2016  PHQ - 2 Score 0 0 0 0  Exception Documentation - - - Patient refusal    Cognitive Function Ad8 score reviewed for issues:  Issues making decisions:  Less interest in hobbies / activities:  Repeats questions, stories (family complaining):  Trouble using ordinary gadgets (microwave, computer, phone):  Forgets the month or year:   Mismanaging finances:   Remembering appts:  Daily problems with thinking and/or memory: Ad8 score is=     MMSE - Mini Mental State Exam 08/19/2017 08/16/2016  Orientation to time 5 5  Orientation to Place 5 5  Registration 3 3  Attention/ Calculation 5 5  Recall 3 3  Language- name 2 objects 2 2  Language- repeat 1 1  Language- follow 3 step command 3 3  Language- read & follow direction 1 1  Write a sentence 1 1  Copy design 1 1  Total score 30 30        Immunization History  Administered Date(s) Administered  . Fluad Quad(high Dose 65+) 12/30/2018  . Influenza Split 01/09/2011, 01/15/2012  . Influenza Whole 03/11/2007, 01/09/2008, 01/26/2009, 01/23/2010  . Influenza, High Dose Seasonal PF 01/22/2013, 12/23/2014, 01/17/2016  . Influenza,inj,Quad PF,6+ Mos 12/21/2013  . Pneumococcal Conjugate-13 06/21/2014  . Pneumococcal Polysaccharide-23 06/19/2013  . Td 07/07/2008  . Zoster 06/21/2014  . Zoster Recombinat (Shingrix) 04/09/2018    Screening Tests Health Maintenance  Topic Date Due  . TETANUS/TDAP  07/08/2018  . INFLUENZA VACCINE  Completed  . PNA vac Low Risk Adult  Completed     Plan:   ***  I have personally reviewed and noted the  following in the patient's chart:   . Medical and social history . Use of alcohol, tobacco or illicit drugs  . Current medications and supplements . Functional ability and status . Nutritional status . Physical activity . Advanced directives . List of other physicians . Hospitalizations, surgeries, and ER visits in previous 12 months . Vitals . Screenings to include cognitive, depression, and falls . Referrals and appointments  In addition, I have reviewed and discussed with patient certain preventive protocols, quality metrics, and best practice recommendations. A written personalized care plan for preventive services as well as general preventive health recommendations were provided to patient.     Shela Nevin, [place]  03/02/2019

## 2019-03-03 ENCOUNTER — Other Ambulatory Visit: Payer: Self-pay

## 2019-03-03 ENCOUNTER — Ambulatory Visit (INDEPENDENT_AMBULATORY_CARE_PROVIDER_SITE_OTHER): Payer: PPO | Admitting: Family Medicine

## 2019-03-03 ENCOUNTER — Ambulatory Visit: Payer: PPO | Admitting: *Deleted

## 2019-03-03 ENCOUNTER — Encounter: Payer: Self-pay | Admitting: Family Medicine

## 2019-03-03 VITALS — BP 160/100 | HR 67 | Temp 97.9°F | Resp 18 | Ht 73.0 in | Wt 178.4 lb

## 2019-03-03 DIAGNOSIS — E039 Hypothyroidism, unspecified: Secondary | ICD-10-CM

## 2019-03-03 DIAGNOSIS — Z0000 Encounter for general adult medical examination without abnormal findings: Secondary | ICD-10-CM | POA: Diagnosis not present

## 2019-03-03 DIAGNOSIS — I10 Essential (primary) hypertension: Secondary | ICD-10-CM

## 2019-03-03 DIAGNOSIS — N401 Enlarged prostate with lower urinary tract symptoms: Secondary | ICD-10-CM | POA: Diagnosis not present

## 2019-03-03 DIAGNOSIS — E890 Postprocedural hypothyroidism: Secondary | ICD-10-CM

## 2019-03-03 DIAGNOSIS — E785 Hyperlipidemia, unspecified: Secondary | ICD-10-CM

## 2019-03-03 DIAGNOSIS — B354 Tinea corporis: Secondary | ICD-10-CM

## 2019-03-03 DIAGNOSIS — R350 Frequency of micturition: Secondary | ICD-10-CM | POA: Insufficient documentation

## 2019-03-03 DIAGNOSIS — G20 Parkinson's disease: Secondary | ICD-10-CM | POA: Diagnosis not present

## 2019-03-03 LAB — CBC WITH DIFFERENTIAL/PLATELET
Basophils Absolute: 0 10*3/uL (ref 0.0–0.1)
Basophils Relative: 0.2 % (ref 0.0–3.0)
Eosinophils Absolute: 0.1 10*3/uL (ref 0.0–0.7)
Eosinophils Relative: 0.9 % (ref 0.0–5.0)
HCT: 41.8 % (ref 39.0–52.0)
Hemoglobin: 14.1 g/dL (ref 13.0–17.0)
Lymphocytes Relative: 27.4 % (ref 12.0–46.0)
Lymphs Abs: 2.7 10*3/uL (ref 0.7–4.0)
MCHC: 33.7 g/dL (ref 30.0–36.0)
MCV: 94.8 fl (ref 78.0–100.0)
Monocytes Absolute: 0.5 10*3/uL (ref 0.1–1.0)
Monocytes Relative: 5.3 % (ref 3.0–12.0)
Neutro Abs: 6.5 10*3/uL (ref 1.4–7.7)
Neutrophils Relative %: 66.2 % (ref 43.0–77.0)
Platelets: 204 10*3/uL (ref 150.0–400.0)
RBC: 4.41 Mil/uL (ref 4.22–5.81)
RDW: 13.2 % (ref 11.5–15.5)
WBC: 9.9 10*3/uL (ref 4.0–10.5)

## 2019-03-03 LAB — LIPID PANEL
Cholesterol: 150 mg/dL (ref 0–200)
HDL: 43.7 mg/dL (ref >39.00)
LDL Cholesterol: 90 mg/dL (ref 0–99)
NonHDL: 105.89
Total CHOL/HDL Ratio: 3
Triglycerides: 78 mg/dL (ref 0.0–149.0)
VLDL: 15.6 mg/dL (ref 0.0–40.0)

## 2019-03-03 LAB — COMPREHENSIVE METABOLIC PANEL
ALT: 5 U/L (ref 0–53)
AST: 20 U/L (ref 0–37)
Albumin: 4.5 g/dL (ref 3.5–5.2)
Alkaline Phosphatase: 49 U/L (ref 39–117)
BUN: 20 mg/dL (ref 6–23)
CO2: 30 mEq/L (ref 19–32)
Calcium: 9.4 mg/dL (ref 8.4–10.5)
Chloride: 99 mEq/L (ref 96–112)
Creatinine, Ser: 1.2 mg/dL (ref 0.40–1.50)
GFR: 57.69 mL/min — ABNORMAL LOW (ref >60.00)
Glucose, Bld: 84 mg/dL (ref 70–99)
Potassium: 4.7 mEq/L (ref 3.5–5.1)
Sodium: 137 mEq/L (ref 135–145)
Total Bilirubin: 0.8 mg/dL (ref 0.2–1.2)
Total Protein: 6.9 g/dL (ref 6.0–8.3)

## 2019-03-03 LAB — TSH: TSH: 16.5 u[IU]/mL — ABNORMAL HIGH (ref 0.35–4.50)

## 2019-03-03 MED ORDER — NAFTIFINE HCL 1 % EX CREA: TOPICAL_CREAM | Freq: Every day | CUTANEOUS | 0 refills | Status: DC

## 2019-03-03 MED ORDER — NYSTATIN 100000 UNIT/GM EX POWD: Freq: Four times a day (QID) | CUTANEOUS | 0 refills | Status: DC

## 2019-03-03 NOTE — Patient Instructions (Signed)
Preventive Care 83 Years and Older, Male Preventive care refers to lifestyle choices and visits with your health care provider that can promote health and wellness. This includes:  A yearly physical exam. This is also called an annual well check.  Regular dental and eye exams.  Immunizations.  Screening for certain conditions.  Healthy lifestyle choices, such as diet and exercise. What can I expect for my preventive care visit? Physical exam Your health care provider will check:  Height and weight. These may be used to calculate body mass index (BMI), which is a measurement that tells if you are at a healthy weight.  Heart rate and blood pressure.  Your skin for abnormal spots. Counseling Your health care provider may ask you questions about:  Alcohol, tobacco, and drug use.  Emotional well-being.  Home and relationship well-being.  Sexual activity.  Eating habits.  History of falls.  Memory and ability to understand (cognition).  Work and work Statistician. What immunizations do I need?  Influenza (flu) vaccine  This is recommended every year. Tetanus, diphtheria, and pertussis (Tdap) vaccine  You may need a Td booster every 10 years. Varicella (chickenpox) vaccine  You may need this vaccine if you have not already been vaccinated. Zoster (shingles) vaccine  You may need this after age 50. Pneumococcal conjugate (PCV13) vaccine  One dose is recommended after age 55. Pneumococcal polysaccharide (PPSV23) vaccine  One dose is recommended after age 72. Measles, mumps, and rubella (MMR) vaccine  You may need at least one dose of MMR if you were born in 1957 or later. You may also need a second dose. Meningococcal conjugate (MenACWY) vaccine  You may need this if you have certain conditions. Hepatitis A vaccine  You may need this if you have certain conditions or if you travel or work in places where you may be exposed to hepatitis A. Hepatitis B vaccine   You may need this if you have certain conditions or if you travel or work in places where you may be exposed to hepatitis B. Haemophilus influenzae type b (Hib) vaccine  You may need this if you have certain conditions. You may receive vaccines as individual doses or as more than one vaccine together in one shot (combination vaccines). Talk with your health care provider about the risks and benefits of combination vaccines. What tests do I need? Blood tests  Lipid and cholesterol levels. These may be checked every 5 years, or more frequently depending on your overall health.  Hepatitis C test.  Hepatitis B test. Screening  Lung cancer screening. You may have this screening every year starting at age 74 if you have a 30-pack-year history of smoking and currently smoke or have quit within the past 15 years.  Colorectal cancer screening. All adults should have this screening starting at age 63 and continuing until age 59. Your health care provider may recommend screening at age 78 if you are at increased risk. You will have tests every 1-10 years, depending on your results and the type of screening test.  Prostate cancer screening. Recommendations will vary depending on your family history and other risks.  Diabetes screening. This is done by checking your blood sugar (glucose) after you have not eaten for a while (fasting). You may have this done every 1-3 years.  Abdominal aortic aneurysm (AAA) screening. You may need this if you are a current or former smoker.  Sexually transmitted disease (STD) testing. Follow these instructions at home: Eating and drinking  Eat  a diet that includes fresh fruits and vegetables, whole grains, lean protein, and low-fat dairy products. Limit your intake of foods with high amounts of sugar, saturated fats, and salt.  Take vitamin and mineral supplements as recommended by your health care provider.  Do not drink alcohol if your health care provider  tells you not to drink.  If you drink alcohol: ? Limit how much you have to 0-2 drinks a day. ? Be aware of how much alcohol is in your drink. In the [place] one drink equals one 12 oz bottle of beer (355 mL), one 5 oz glass of wine (148 mL), or one 1 oz glass of hard liquor (44 mL). Lifestyle  Take daily care of your teeth and gums.  Stay active. Exercise for at least 30 minutes on 5 or more days each week.  Do not use any products that contain nicotine or tobacco, such as cigarettes, e-cigarettes, and chewing tobacco. If you need help quitting, ask your health care provider.  If you are sexually active, practice safe sex. Use a condom or other form of protection to prevent STIs (sexually transmitted infections).  Talk with your health care provider about taking a low-dose aspirin or statin. What's next?  Visit your health care provider once a year for a well check visit.  Ask your health care provider how often you should have your eyes and teeth checked.  Stay up to date on all vaccines. This information is not intended to replace advice given to you by your health care provider. Make sure you discuss any questions you have with your health care provider. Document Released: 04/15/2015 Document Revised: 03/13/2018 Document Reviewed: 03/13/2018 Elsevier Patient Education  2020 [employer].

## 2019-03-03 NOTE — Assessment & Plan Note (Signed)
Check labs 

## 2019-03-03 NOTE — Assessment & Plan Note (Signed)
Per neuro Pt will call for f/u

## 2019-03-03 NOTE — Assessment & Plan Note (Signed)
Tolerating statin, encouraged heart healthy diet, avoid trans fats, minimize simple carbs and saturated fats. Increase exercise as tolerated 

## 2019-03-03 NOTE — Assessment & Plan Note (Signed)
ghm utd Check labs See AVS 

## 2019-03-03 NOTE — Assessment & Plan Note (Signed)
High today  Has been running low previously Recheck 2-3 weeks

## 2019-03-03 NOTE — Assessment & Plan Note (Deleted)
Well controlled, no changes to meds. Encouraged heart healthy diet such as the DASH diet and exercise as tolerated.  °

## 2019-03-03 NOTE — Assessment & Plan Note (Signed)
Stable Check labs 

## 2019-03-03 NOTE — Progress Notes (Signed)
Patient ID: William Williamson, male    DOB: 1932/10/22  Age: 85 y.o. MRN: [id number]    Subjective:  Subjective  HPI William Williamson presents for cpe.  He c/o worsening tremors and needs to f/u with neuro He also c/o "jock itch"  But does not want to undress for exam   Review of Systems  Constitutional: Negative for appetite change, diaphoresis, fatigue and unexpected weight change.  Eyes: Negative for pain, redness and visual disturbance.  Respiratory: Negative for cough, chest tightness, shortness of breath and wheezing.   Cardiovascular: Negative for chest pain, palpitations and leg swelling.  Endocrine: Negative for cold intolerance, heat intolerance, polydipsia, polyphagia and polyuria.  Genitourinary: Negative for difficulty urinating, dysuria and frequency.  Skin: Positive for rash.       Pt c/o jock itch but does not want to get undressed for exam   Neurological: Negative for dizziness, light-headedness, numbness and headaches.    History Past Medical History:  Diagnosis Date   Anemia 1952   post Oromycin for Tularemia   Anisocoria    post op   Arthritis    Autonomic dysfunction    GERD (gastroesophageal reflux disease)    Glaucoma    Dr Bing Plume   Hearing loss in right ear    Hypercholesteremia    Hyperthyroidism    s/p RAI   LVH (left ventricular hypertrophy)    Orthostatic hypotension    Parkinson's disease ([place])     He has a past surgical history that includes Septoplasty (1970); Cardiac catheterization (07/2006); Vasectomy; RAI ablation (02/2008); Cataract extraction, bilateral; and Colonoscopy.   His family history includes Diabetes in his brother; Heart attack (age of onset: 51) in his maternal grandfather; Hypertension in his brother and sister; Osteoporosis in his mother; Pancreatic cancer in his brother; Prostate cancer in his brother; Stomach cancer in his maternal grandmother.He reports that he quit smoking about 50 years ago. He has never  used smokeless tobacco. He reports current alcohol use of about 6.0 standard drinks of alcohol per week. He reports that he does not use drugs.  Current Outpatient Medications on File Prior to Visit  Medication Sig Dispense Refill   carbidopa-levodopa (SINEMET IR) 25-100 MG tablet TAKE 2 TABLETS BY MOUTH THREE TIMES DAILY 540 tablet 0   Cholecalciferol 2000 UNITS CAPS Take 1 capsule by mouth daily.      docusate sodium (COLACE) 100 MG capsule Take 200 mg by mouth daily as needed for mild constipation.     dorzolamide-timolol (COSOPT) 22.3-6.8 MG/ML ophthalmic solution Place 1 drop into both eyes 2 (two) times daily.      furosemide (LASIX) 20 MG tablet Take 1 tablet (20 mg total) by mouth daily. 30 tablet 3   latanoprost (XALATAN) 0.005 % ophthalmic solution Place 1 drop into both eyes at bedtime.      levothyroxine (SYNTHROID) 88 MCG tablet Take 1 tablet (88 mcg total) by mouth daily. 90 tablet 1   NONFORMULARY OR COMPOUNDED ITEM Walker  #1   Dx parkinson disease 1 each 0   polyethylene glycol (MIRALAX / GLYCOLAX) packet Take 17 g by mouth daily as needed.      simvastatin (ZOCOR) 20 MG tablet TAKE 1 TABLET BY MOUTH ONCE DAILY IN THE EVENING 90 tablet 0   No current facility-administered medications on file prior to visit.      Objective:  Objective  Physical Exam Vitals signs and nursing note reviewed.  Constitutional:      General: He is  not in acute distress.    Appearance: He is well-developed. He is not diaphoretic.  HENT:     Head: Normocephalic and atraumatic.     Right Ear: External ear normal.     Left Ear: External ear normal.     Nose: Nose normal.     Mouth/Throat:     Pharynx: No oropharyngeal exudate.  Eyes:     General:        Right eye: No discharge.        Left eye: No discharge.     Conjunctiva/sclera: Conjunctivae normal.     Pupils: Pupils are equal, round, and reactive to light.  Neck:     Musculoskeletal: Normal range of motion and neck supple.       Thyroid: No thyromegaly.     Vascular: No JVD.  Cardiovascular:     Rate and Rhythm: Normal rate and regular rhythm.     Heart sounds: Normal heart sounds. No murmur. No friction rub. No gallop.   Pulmonary:     Effort: Pulmonary effort is normal. No respiratory distress.     Breath sounds: Normal breath sounds. No wheezing or rales.  Chest:     Chest wall: No tenderness.  Abdominal:     General: Bowel sounds are normal. There is no distension.     Palpations: Abdomen is soft. There is no mass.     Tenderness: There is no abdominal tenderness. There is no guarding or rebound.  Genitourinary:    Comments: Per urology Pt did not want prostate exam today Musculoskeletal: Normal range of motion.        General: No tenderness.  Lymphadenopathy:     Cervical: No cervical adenopathy.  Skin:    General: Skin is warm and dry.     Coloration: Skin is not pale.     Findings: No erythema or rash.  Neurological:     Mental Status: He is alert and oriented to person, place, and time.     Motor: No abnormal muscle tone.     Deep Tendon Reflexes: Reflexes are normal and symmetric. Reflexes normal.     Comments: + tremors-- worsening per pt    Psychiatric:        Behavior: Behavior normal.        Thought Content: Thought content normal.        Judgment: Judgment normal.    BP (!) 160/100 (BP Location: Right Arm, Patient Position: Sitting, Cuff Size: Normal)    Pulse 67    Temp 97.9 F (36.6 C) (Temporal)    Resp 18    Ht 6\' 1"  (1.854 m)    Wt 178 lb 6.4 oz (80.9 kg)    SpO2 (!) 85%    BMI 23.54 kg/m  Wt Readings from Last 3 Encounters:  03/03/19 178 lb 6.4 oz (80.9 kg)  12/30/18 177 lb 9.6 oz (80.6 kg)  09/23/18 173 lb (78.5 kg)     Lab Results  Component Value Date   WBC 9.9 03/03/2019   HGB 14.1 03/03/2019   HCT 41.8 03/03/2019   PLT 204.0 03/03/2019   GLUCOSE 84 03/03/2019   CHOL 150 03/03/2019   TRIG 78.0 03/03/2019   HDL 43.70 03/03/2019   LDLCALC 90 03/03/2019    ALT 5 03/03/2019   AST 20 03/03/2019   NA 137 03/03/2019   K 4.7 03/03/2019   CL 99 03/03/2019   CREATININE 1.20 03/03/2019   BUN 20 03/03/2019   CO2 30 03/03/2019  TSH 16.50 (H) 03/03/2019   PSA 4.85 (H) 08/16/2016   INR 0.9 RATIO 07/26/2006   HGBA1C 5.9 10/09/2006    Dg Op Swallowing Func-medicare/speech Path  Result Date: 03/05/2018 Objective Swallowing Evaluation: Type of Study: MBS-Modified Barium Swallow Study  Patient Details Name: ARIAS UPLINGER MRN: [id number] Date of Birth: November 22, 1932 Today's Date: 03/05/2018 Time: SLP Start Time (ACUTE ONLY): 1328 -SLP Stop Time (ACUTE ONLY): 1410 SLP Time Calculation (min) (ACUTE ONLY): 42 min Past Medical History: Past Medical History: Diagnosis Date  Anemia 1952  post Oromycin for [place]mia  Anisocoria   post op  Arthritis   Autonomic dysfunction   GERD (gastroesophageal reflux disease)   Glaucoma   Dr Bing Plume  Hearing loss in right ear   Hypercholesteremia   Hyperthyroidism   s/p RAI  LVH (left ventricular hypertrophy)   Orthostatic hypotension   Parkinson's disease ([place])  Past Surgical History: Past Surgical History: Procedure Laterality Date  CARDIAC CATHETERIZATION  07/2006  Dr.  Eustace Quail, negative  CATARACT EXTRACTION, BILATERAL    OS retinal surgery for post op floaters  COLONOSCOPY    negative X 3  RAI ablation  02/2008  hyperthyroidism  SEPTOPLASTY  1970  VASECTOMY   HPI: 85 yo gentleman referred for MBS by Dr Etter Sjogren due to pt report of coughing with intake - He reports most difficulties with solids requiring him to cough and at times expectorate solids mixed with secretions.  He states this mostly occurs immediately after eating.  Reports difficulties with nuts, oranges, etc.  Pt denies weight loss, pneumonias nor requiring heimlich manuever.  He has not had prior swallow studies.  H/o GERD but pt states this "went away".  Pt also reports he frequently drools.   Subjective: pt awake sitting upright in chair, notable  excessive upper body movement due to his Parkinsons Assessment / Plan / Recommendation CHL IP CLINICAL IMPRESSIONS 03/05/2018 Clinical Impression Patient presents with mild oral and moderate pharyngeal dysphagia with sensorimotor deficits.  Impaired pharyngeal contraction, tongue base retraction and laryngeal elevation results in residuals across all consistencies.  Various postures including head turn left *to pt's purported weak side* with and without chin tuck with thin and nectar did not prevent accumulation of barium and resulted in trace laryngeal penetration of thin as barium spilled from pyriform into open larynx.  He did NOT aspirate and trace penetrates cleared with further swallow.  Pt does not always sense residuals and pharyngeal residuals were not worse with solids compared to liquids.   SLP taxed pt in effort to try to get him to aspirate with sequential boluses of thin (7) but he did not.  Dry swallows *reflexive and cued* help to decrease residuals.   Cued "hock" helpful for pt to expel vallecular residuals and advised pt to use this strategy during meals to assure vallecular clearance.  Pt able to transit and swallow barium tablet with thin without difficulty.  Upon esophageal sweep, esophagus appeared clear but this test is not intended to evaluate esophageal function.  Advised pt to start meals with liquids *prefer water*, conduct multiple dry swallows per bolus, masticate thoroughly, conduct effortful swallow, follow solids with liquids, intermittent "hock".  Also provided him with lingual press and effortful swallow exercise to facilitate pharyngeal contraction.  Advised he use caution with foods that cause difficulties like oranges, nuts - and he may need to avoid these due to increased pulmonary risk if aspirating solids.  Please consider referral for OP for pt to undergo Respiratory Muscle Strength  Training *RMST* to facilitate laryngeal closure, voice and cough strength for maximal  swallow/voice benefits.  Using teach back and written instructions pt educated.  Thanks for this referral.   SLP Visit Diagnosis Dysphagia, oropharyngeal phase (R13.12);Dysphagia, pharyngoesophageal phase (R13.14) Attention and concentration deficit following -- Frontal lobe and executive function deficit following -- Impact on safety and function Moderate aspiration risk   No flowsheet data found.  No flowsheet data found. CHL IP DIET RECOMMENDATION 03/05/2018 SLP Diet Recommendations Regular solids;Thin liquid Liquid Administration via Cup;Straw Medication Administration Other ( as tolerated, with pudding if problematic Compensations Slow rate;Small sips/bites;Multiple dry swallows after each bite/sip;Follow solids with liquid;Effortful swallow, intermittent "hock" Postural Changes Remain semi-upright after after feeds/meals (Comment);Seated upright at 90 degrees   CHL IP OTHER RECOMMENDATIONS 03/05/2018 Recommended Consults OP therapy for feeding Oral Care Recommendations Oral care BID Other Recommendations --   CHL IP FOLLOW UP RECOMMENDATIONS 03/05/2018 Follow up Recommendations Outpatient SLP   No flowsheet data found.     CHL IP ORAL PHASE 03/05/2018 Oral Phase Impaired Oral - Pudding Teaspoon -- Oral - Pudding Cup -- Oral - Honey Teaspoon -- Oral - Honey Cup -- Oral - Nectar Teaspoon -- Oral - Nectar Cup Reduced posterior propulsion Oral - Nectar Straw Reduced posterior propulsion Oral - Thin Teaspoon Lingual pumping Oral - Thin Cup Reduced posterior propulsion Oral - Thin Straw Reduced posterior propulsion Oral - Puree WFL;Lingual pumping Oral - Mech Soft -- Oral - Regular WFL Oral - Multi-Consistency -- Oral - Pill WFL Oral Phase - Comment lingual pumping with thin via tsp and puree, swallow appears effortful with increased visocity  CHL IP PHARYNGEAL PHASE 03/05/2018 Pharyngeal Phase Impaired Pharyngeal- Pudding Teaspoon -- Pharyngeal -- Pharyngeal- Pudding Cup -- Pharyngeal -- Pharyngeal- Honey Teaspoon --  Pharyngeal -- Pharyngeal- Honey Cup -- Pharyngeal -- Pharyngeal- Nectar Teaspoon -- Pharyngeal -- Pharyngeal- Nectar Cup Reduced pharyngeal peristalsis;Reduced epiglottic inversion;Reduced laryngeal elevation;Reduced airway/laryngeal closure;Reduced tongue base retraction;Pharyngeal residue - valleculae;Pharyngeal residue - pyriform Pharyngeal -- Pharyngeal- Nectar Straw Reduced pharyngeal peristalsis;Reduced epiglottic inversion;Reduced laryngeal elevation;Reduced airway/laryngeal closure;Reduced tongue base retraction;Pharyngeal residue - valleculae;Pharyngeal residue - pyriform Pharyngeal -- Pharyngeal- Thin Teaspoon Reduced pharyngeal peristalsis;Reduced epiglottic inversion;Reduced laryngeal elevation;Reduced airway/laryngeal closure;Reduced tongue base retraction;Pharyngeal residue - valleculae;Pharyngeal residue - pyriform Pharyngeal -- Pharyngeal- Thin Cup Reduced pharyngeal peristalsis;Reduced epiglottic inversion;Reduced laryngeal elevation;Reduced airway/laryngeal closure;Reduced tongue base retraction;Pharyngeal residue - valleculae;Pharyngeal residue - pyriform Pharyngeal -- Pharyngeal- Thin Straw Reduced pharyngeal peristalsis;Reduced epiglottic inversion;Reduced laryngeal elevation;Reduced airway/laryngeal closure;Reduced tongue base retraction;Penetration/Aspiration during swallow;Pharyngeal residue - valleculae;Pharyngeal residue - pyriform Pharyngeal Material enters airway, remains ABOVE vocal cords then ejected out Pharyngeal- Puree Reduced pharyngeal peristalsis;Reduced epiglottic inversion;Pharyngeal residue - valleculae;Pharyngeal residue - pyriform Pharyngeal -- Pharyngeal- Mechanical Soft -- Pharyngeal -- Pharyngeal- Regular Reduced pharyngeal peristalsis;Reduced epiglottic inversion;Pharyngeal residue - valleculae;Pharyngeal residue - pyriform Pharyngeal -- Pharyngeal- Multi-consistency -- Pharyngeal -- Pharyngeal- Pill Reduced pharyngeal peristalsis;Reduced epiglottic inversion;Reduced  laryngeal elevation;Reduced airway/laryngeal closure;Reduced tongue base retraction;Pharyngeal residue - valleculae;Pharyngeal residue - pyriform Pharyngeal -- Pharyngeal Comment various postures, head turn left *to pt's reported weak side where he "drag his leg" with and without chin tuck not helpful to prevent residual accumulation and actually allowed TRACE penetration of thin due to spillage from pyriform sinus into airway  CHL IP CERVICAL ESOPHAGEAL PHASE 03/05/2018 Cervical Esophageal Phase Impaired Pudding Teaspoon -- Pudding Cup -- Honey Teaspoon -- Honey Cup -- Nectar Teaspoon -- Nectar Cup -- Nectar Straw -- Thin Teaspoon -- Thin Cup -- Thin Straw -- Puree -- Mechanical Soft -- Regular -- Multi-consistency --  Pill -- Cervical Esophageal Comment decreased clearance into esophagus resulting in pyriform sinus residuals Macario Golds 03/05/2018, 4:13 PM      Luanna Salk, MS [facility] SLP Acute Rehab Services Pager [phone] Office [phone]       CLINICAL DATA:  Dysphagia. EXAM: MODIFIED BARIUM SWALLOW TECHNIQUE: Different consistencies of barium were administered orally to the patient by the Speech Pathologist. Imaging of the pharynx was performed in the lateral projection. FLUOROSCOPY TIME:  Fluoroscopy Time:  1 minutes 42 seconds Radiation Exposure Index (if provided by the fluoroscopic device): 8.2 MGy Number of Acquired Spot Images: 0 COMPARISON:  None. FINDINGS: Thin liquid- within normal limits Nectar thick liquid- within normal limits Honey- within normal limits Pure- within normal limits Cracker-within normal limits Pure with cracker- within normal limits Barium tablet -  within normal limits Pooling of contrast within the hypopharynx was noted with all consistencies. IMPRESSION: Pooling of contrast within the hypopharynx, otherwise no evidence of laryngeal penetration or tracheal aspiration. Please refer to the Speech Pathologists report for complete details and recommendations. Electronically  Signed   By: Fidela Salisbury M.D.   On: 03/05/2018 14:30     Assessment & Plan:  Plan  I am having William Williamson start on nystatin and naftifine. I am also having him maintain his Cholecalciferol, dorzolamide-timolol, polyethylene glycol, latanoprost, docusate sodium, NONFORMULARY OR COMPOUNDED ITEM, simvastatin, carbidopa-levodopa, levothyroxine, and furosemide.  Meds ordered this encounter  Medications   nystatin (NYSTATIN) powder    Sig: Apply topically 4 (four) times daily.    Dispense:  15 g    Refill:  0   naftifine (NAFTIN) 1 % cream    Sig: Apply topically daily.    Dispense:  30 g    Refill:  0    Problem List Items Addressed This Visit      Unprioritized   Benign prostatic hyperplasia with urinary frequency    Stable  Check labs       Essential hypertension    High today  Has been running low previously Recheck 2-3 weeks       Hyperlipidemia    Tolerating statin, encouraged heart healthy diet, avoid trans fats, minimize simple carbs and saturated fats. Increase exercise as tolerated      Relevant Orders   Lipid panel (Completed)   Comprehensive metabolic panel (Completed)   Hypothyroidism   Relevant Orders   TSH (Completed)   Hypothyroidism following radioiodine therapy    Check labs      Parkinson disease [facility])    Per neuro Pt will call for f/u      Relevant Orders   CBC with Differential (Completed)   Preventative health care - Primary    ghm utd Check labs  See AVS      Tinea corporis   Relevant Medications   nystatin (NYSTATIN) powder   naftifine (NAFTIN) 1 % cream      Follow-up: Return in about 6 months (around 09/01/2019), or if symptoms worsen or fail to improve, for hypertension, hyperlipidemia.  [name], DO

## 2019-03-04 ENCOUNTER — Other Ambulatory Visit: Payer: Self-pay | Admitting: Family Medicine

## 2019-03-04 ENCOUNTER — Other Ambulatory Visit: Payer: Self-pay

## 2019-03-04 DIAGNOSIS — E039 Hypothyroidism, unspecified: Secondary | ICD-10-CM

## 2019-03-04 MED ORDER — LEVOTHYROXINE SODIUM 100 MCG PO TABS: 100.0000 ug | ORAL_TABLET | Freq: Every day | ORAL | 2 refills | Status: DC

## 2019-03-13 NOTE — Progress Notes (Signed)
Virtual Visit via Telephone Note The purpose of this virtual visit is to provide medical care while limiting exposure to the novel coronavirus.    Consent was obtained for phone visit:  Yes.   Answered questions that patient had about telehealth interaction:  Yes.   I discussed the limitations, risks, security and privacy concerns of performing an evaluation and management service by telephone. I also discussed with the patient that there may be a patient responsible charge related to this service. The patient expressed understanding and agreed to proceed.  Pt location: Home Physician Location: office Name of referring provider:  [name], * I connected with .William Williamson at patients initiation/request on 03/16/2019 at  2:00 PM EST by telephone and verified that I am speaking with the correct person using two identifiers.  Pt MRN:  [id number] Pt DOB:  1932-06-12   History of Present Illness:  Patient seen today in follow-up for Parkinson's disease.  I have not seen him in over a year.  "I have not gotten any better."   "I haven't had this virus but I cannot say I am healthy." He is on carbidopa/levodopa 25/100, 2 tablets 3 times per day.  States that he has had falls but cannot remember when or the circumstances.  States that he has trouble after bending over with balance but no lightheadedness or near syncope.  BP is the 130's.  States that L foot is swollen but has already seen pcp about that.  More trouble with tremor.  Tremor is bothersome to him.   Observations/Objective:   There were no vitals filed for this visit.   Assessment and Plan:   1.  Parkinson's disease, Hoehn and Yoehr stage 2.5 -3  -Cautiously increase carbidopa/levodopa 25/100 so that he takes 2 tablets at 7 AM, 2 tablets at 11 AM, 2 tablets at 3 PM and 1 tablet at 7 PM.  -Patient's disease state has been complicated by mild dyskinesia.  He really has not wanted any more medication for  that.  -Last time I saw the patient (over a year ago) he did have some drag in the left leg but it appeared that there was an orthopedic component to it.  Recommended using walker.  -Discussed whether he needed home physical therapy right now.  He stated that he did not want it.  He will let me know if he changes his mind.  -Discussed Parkinson's resources available to him while he was in the home.  -discussed covid vaccine which he plans to take when avail to him 2.  History of constipation, associated with Parkinson's disease  -Better with MiraLAX/ClearLax  -Encouraged water intake/hydration 3.  History of sialorrhea associated with Parkinson's  -Discussed the value of botulinum toxin.  He does not wish to proceed with that.   Follow Up Instructions:  6 months  -I discussed the assessment and treatment plan with the patient. The patient was provided an opportunity to ask questions and all were answered. The patient agreed with the plan and demonstrated an understanding of the instructions.   The patient was advised to call back or seek an in-person evaluation if the symptoms worsen or if the condition fails to improve as anticipated.    Total Time spent in visit with the patient was:  12 min, of which 100% of the time was spent in counseling .   Pt understands and agrees with the plan of care outlined.     Alonza Bogus, DO

## 2019-03-16 ENCOUNTER — Encounter: Payer: Self-pay | Admitting: Neurology

## 2019-03-16 ENCOUNTER — Telehealth (INDEPENDENT_AMBULATORY_CARE_PROVIDER_SITE_OTHER): Payer: PPO | Admitting: Neurology

## 2019-03-16 ENCOUNTER — Other Ambulatory Visit: Payer: Self-pay

## 2019-03-16 DIAGNOSIS — K117 Disturbances of salivary secretion: Secondary | ICD-10-CM

## 2019-03-16 DIAGNOSIS — G20 Parkinson's disease: Secondary | ICD-10-CM | POA: Diagnosis not present

## 2019-03-16 DIAGNOSIS — K5900 Constipation, unspecified: Secondary | ICD-10-CM

## 2019-03-16 MED ORDER — CARBIDOPA-LEVODOPA 25-100 MG PO TABS: ORAL_TABLET | ORAL | 1 refills | Status: DC

## 2019-03-19 ENCOUNTER — Other Ambulatory Visit: Payer: Self-pay | Admitting: Family Medicine

## 2019-03-19 NOTE — Telephone Encounter (Signed)
Change in therapy 03/04/2019

## 2019-04-25 ENCOUNTER — Ambulatory Visit: Payer: PPO

## 2019-05-08 ENCOUNTER — Telehealth: Payer: Self-pay | Admitting: Family Medicine

## 2019-05-11 ENCOUNTER — Other Ambulatory Visit: Payer: Self-pay | Admitting: Neurology

## 2019-05-11 ENCOUNTER — Other Ambulatory Visit: Payer: Self-pay | Admitting: Family Medicine

## 2019-05-11 DIAGNOSIS — E785 Hyperlipidemia, unspecified: Secondary | ICD-10-CM

## 2019-05-11 NOTE — Progress Notes (Signed)
Unsuccessful outreach today in response to referral by [name], DO for CCM/Care coordination services.  Will attempt outreach again in 7 days.  Raynicia Dukes [employer] Scheduler

## 2019-05-13 DIAGNOSIS — L570 Actinic keratosis: Secondary | ICD-10-CM | POA: Diagnosis not present

## 2019-05-13 DIAGNOSIS — X32XXXD Exposure to sunlight, subsequent encounter: Secondary | ICD-10-CM | POA: Diagnosis not present

## 2019-05-26 ENCOUNTER — Telehealth: Payer: Self-pay | Admitting: Family Medicine

## 2019-05-26 NOTE — Chronic Care Management (AMB) (Signed)
  Chronic Care Management   Note  05/26/2019 Name: William Williamson MRN: [id number] DOB: October 05, 1932  William Williamson is a 86 y.o. year old male who is a primary care patient of [name], DO. I reached out to [employer] by phone today in response to a referral sent by William Williamson's PCP, Carollee Herter, Alferd Apa, DO.   [name] was given information about Chronic Care Management services today including:  1. CCM service includes personalized support from designated clinical staff supervised by his physician, including individualized plan of care and coordination with other care providers 2. 24/7 contact phone numbers for assistance for urgent and routine care needs. 3. Service will only be billed when office clinical staff spend 20 minutes or more in a month to coordinate care. 4. Only one practitioner may furnish and bill the service in a calendar month. 5. The patient may stop CCM services at any time (effective at the end of the month) by phone call to the office staff. 6. The patient will be responsible for cost sharing (co-pay) of up to 20% of the service fee (after annual deductible is met).  Patient agreed to services and verbal consent obtained.   Follow up plan:   Raynicia Dukes [employer] Scheduler

## 2019-05-29 ENCOUNTER — Other Ambulatory Visit: Payer: Self-pay

## 2019-05-29 DIAGNOSIS — E039 Hypothyroidism, unspecified: Secondary | ICD-10-CM

## 2019-05-29 DIAGNOSIS — E785 Hyperlipidemia, unspecified: Secondary | ICD-10-CM

## 2019-05-29 DIAGNOSIS — I10 Essential (primary) hypertension: Secondary | ICD-10-CM

## 2019-05-31 ENCOUNTER — Other Ambulatory Visit: Payer: Self-pay | Admitting: Family Medicine

## 2019-06-01 NOTE — Chronic Care Management (AMB) (Signed)
Chronic Care Management Pharmacy  Name: William Williamson  MRN: [id number] DOB: June 14, 1932  Chief Complaint/ HPI  William Williamson,  86 y.o. , male presents for their Initial CCM visit with the clinical pharmacist via telephone due to COVID-19 Pandemic.  PCP : [name], DO  Their chronic conditions include: HTN, HLD, Hypothyroidism, Parkinson Disease, Vitamin D Deficiency, BPH, Lower Extremity Edema, Tinea Corporis, Glaucoma, Constipation  Office Visits: 03/03/19:Visit w/ Dr. Etter Sjogren - Pt with worsening tremor and need to follow up with neuro. Complaint of jock itch. Prescribed nystatin and nfaftifine 1% cream.  Labs drawn (CMP, TSH, CBC, Lipid). TSH came back elevated, increased levothyroxine to 146mcg daily from 66mcg when TSH lab resulted.   12/30/18: Visit w/ Dr. Etter Sjogren - Lower extremity edema. HCTZ did not work, switched to furosemide. Labs drawn (TSH, CMP, lipid). Pt to eat foods high in potassium  Consult Visit: 03/16/19: Neurology visit w/ Dr. Carles Collet - Pt doesn't feel like he has gotten any better. Reports falls, but can't remember details. L foot swollen. Trouble with tremor which is bothersome to him. Parkinson's disease, Hoehn and Yoehr stage 2.5-3. Increase carbidopa/levodopa 25/100mg  to 2 tabs 7am, 2 tabs 11am, 2 tabs 3pm, and 1 tab at 7pm. Disease complicated by mild dyskinesia. Recommended pt to use walker. Pt declined PT. Recommended to get COVID vaccine when available to him. Follow up in 6 months  03/02/19: Ophthalmology visit w/ Dr. George Ina - Primary open-angle glaucoma, bilateral, moderate stage  01/05/19: Urology visit w/ Dr. Junious Silk  Medications: Outpatient Encounter Medications as of 06/02/2019  Medication Sig Note  . carbidopa-levodopa (SINEMET IR) 25-100 MG tablet TAKE 2 TABLETS BY MOUTH THREE TIMES DAILY 06/02/2019: 03/16/19: Neuro increased to #2 at 7am, #2 at 11am, #2 at 3pm, #1 at 7pm  . Cholecalciferol 2000 UNITS CAPS Take 1 capsule by mouth daily.     . dorzolamide-timolol (COSOPT) 22.3-6.8 MG/ML ophthalmic solution Place 1 drop into both eyes 2 (two) times daily.  06/02/2019: Uses once daily due to difficulty with tremor   . latanoprost (XALATAN) 0.005 % ophthalmic solution Place 1 drop into both eyes at bedtime.    Marland Kitchen levothyroxine (SYNTHROID) 100 MCG tablet Take 1 tablet (100 mcg total) by mouth daily before breakfast.   . NONFORMULARY OR COMPOUNDED ITEM Walker  #1   Dx parkinson disease   . simvastatin (ZOCOR) 20 MG tablet TAKE 1 TABLET BY MOUTH ONCE DAILY IN THE EVENING   . docusate sodium (COLACE) 100 MG capsule Take 200 mg by mouth daily as needed for mild constipation.   . furosemide (LASIX) 20 MG tablet Take 1 tablet (20 mg total) by mouth daily. (Patient not taking: Reported on 06/02/2019)   . naftifine (NAFTIN) 1 % cream Apply topically daily. 06/02/2019: Did not pick up due to cost  . nystatin (NYSTATIN) powder Apply topically 4 (four) times daily.   . polyethylene glycol (MIRALAX / GLYCOLAX) packet Take 17 g by mouth daily as needed.  06/02/2019: Uses glycerin suppository when he is constipated around once per week. Felt miralax was too much   No facility-administered encounter medications on file as of 06/02/2019.   Immunization History  Administered Date(s) Administered  . Fluad Quad(high Dose 65+) 12/30/2018  . Influenza Split 01/09/2011, 01/15/2012  . Influenza Whole 03/11/2007, 01/09/2008, 01/26/2009, 01/23/2010  . Influenza, High Dose Seasonal PF 01/22/2013, 12/23/2014, 01/17/2016  . Influenza,inj,Quad PF,6+ Mos 12/21/2013  . Influenza-Unspecified 12/31/2017  . Pneumococcal Conjugate-13 06/21/2014  . Pneumococcal Polysaccharide-23 06/19/2013  .  Td 07/07/2008  . Zoster 06/21/2014  . Zoster Recombinat (Shingrix) 04/09/2018     Current Diagnosis/Assessment:  Goals Addressed            This Visit's Progress   . Blood pressure goal less than 140/90      . Check blood pressure 2-3 times per week      . Complete TSH lab  at next office visit      . Complete Vitamin D lab at next office visit      . Discuss medication options with urologist       Tamsulosin: might have less side effects than terazosin, but there is some data with terazosin for patients that have Parkinson's Disease  Terazosin: may cause more low blood pressure, but there is limited data that it could help patients with Parkinson's Disease  These are only recommendations for you to discuss with your urologist. Your urologist is more familiar with your history. I'm happy to help any way that I can.    . Follow up with your neurologist around 09/2019 or sooner      . Pharmacy Care Plan       Current Barriers:  . Chronic Disease Management support, education, and care coordination needs related to HTN, HLD, Hypothyroidism, Parkinson Disease, Vitamin D Deficiency, BPH, Lower Extremity Edema, Tinea Corporis, Glaucoma, Constipation  Pharmacist Clinical Goal(s):  Marland Kitchen Over the next 90 days, patient will demonstrate Improved medication adherence as evidenced by appropriate fill dates assisted by adherence packaging  . Blood pressure goal <140/90 . LDL goal <100 . TSH WNL . Reduced urinary symptoms due to BPH . Follow up with neurologist by 09/2019 or earlier  Interventions: . Comprehensive medication review performed. . Start taking levothyroxine first thing in the morning at least 30 minutes before food or any other medications . Complete TSH lab at next office visit . Complete Vitamin D lab at next office visit . Check blood pressure 2-3 times per week . Start lotrimin (butenafine) to help with rash . Discuss medication options with urologist (possibility of tamsulosin vs terazosin)  Patient Self Care Activities:  . Patient verbalizes understanding of plan to follow as described above, Self administers medications as prescribed, Calls pharmacy for medication refills, and Calls provider office for new concerns or questions  Initial goal  documentation     . Start Lotrimin (butenafine) to help with rash      . Take levothyroxine 1st thing in the morning before any other medications or food        Social Hx: Former Therapist, art x 4 years. 3 sons (1 [place], [place] (Middle son;3 kids; retiring from [employer]), 1 [place] (oldest 3 kids), 1 (youngest in [place], but planning to move in with him)  Uses pill box  Hypertension   CMP Latest Ref Rng & Units 03/03/2019 12/30/2018 09/23/2018  Glucose 70 - 99 mg/dL 84 87 93  BUN 6 - 23 mg/dL 20 15 20   Creatinine 0.40 - 1.50 mg/dL 1.20 1.03 0.99  Sodium 135 - 145 mEq/L 137 137 131(L)  Potassium 3.5 - 5.1 mEq/L 4.7 4.3 4.3  Chloride 96 - 112 mEq/L 99 101 92(L)  CO2 19 - 32 mEq/L 30 31 32  Calcium 8.4 - 10.5 mg/dL 9.4 9.3 9.3  Total Protein 6.0 - 8.3 g/dL 6.9 6.7 -  Total Bilirubin 0.2 - 1.2 mg/dL 0.8 0.6 -  Alkaline Phos 39 - 117 U/L 49 48 -  AST 0 - 37 U/L 20 18 -  ALT 0 - 53 U/L 5 4 -  GFR      57.69   66.84   72.11  BP today is: 174/94 (denies chest pain, SOB, dizziness, or any further alarm symptoms)  Office blood pressures are  BP Readings from Last 3 Encounters:  03/03/19 (!) 160/100  12/30/18 (!) 170/90  09/23/18 (!) 144/98    Patient has failed these meds in the past: lisinopril, ramipril (hypotension with course of midodrine at one point)  Patient is currently uncontrolled on the following medications: None  Patient checks BP at home several times per month  Patient home BP readings are ranging: 130/70  We discussed: Proper blood pressure measurement technique   Plan -Check blood pressure 2-3 times per week -Continue control with diet and exercise     Hyperlipidemia   Lipid Panel     Component Value Date/Time   CHOL 150 03/03/2019 1358   TRIG 78.0 03/03/2019 1358   TRIG 79 01/23/2006 1303   HDL 43.70 03/03/2019 1358   CHOLHDL 3 03/03/2019 1358   VLDL 15.6 03/03/2019 1358   Pasatiempo 90 03/03/2019 1358     ASCVD 10-year risk: Unable to assess due to age  >6  LDL Goal <100  Patient has failed these meds in past: Niacin (flushing) Patient is currently controlled on the following medications: simvastatin 20mg  daily, coQ10 300mg  daily  Plan -Continue current medications  Hypothyroidism   TSH  Date Value Ref Range Status  03/03/2019 16.50 (H) 0.35 - 4.50 uIU/mL Final     No components found for: T4  Patient has failed these meds in past: None noted Patient is currently uncontrolled per last TSH, medication was increased, need to reassess on the following medications: levothyroxine 168mcg daily  We discussed:  Optimal administration of levothyroxine. Pt reports adherence and states he always takes his levothyroxine with his carbidopa/levadopa. Unsure why acute TSH spike, but recommend taking levothyroxine optimally to reduce this as a variable  Plan -Start taking levothyroxine 1st thing in the morning before any other medications or food by at least 30 minutes -Complete TSH lab at next office visit -Continue current medications  Parkinson Disease    Patient has failed these meds in past: None noted Patient is currently stable on the following medications: carbidopa-levodopa 25-100mg  2 tabs TID  Patient is concerned with decline in functioning.  Carb/Levo: Usually takes 6 tabs per Shaima Sardinas. Only takes the #7 tabs every other Phi Avans. "The TRUE [place] seems to be over before you know what's happened". Does not feel like the medication is making a difference.Has taken ~5 years starting at #2 tabs daily  Patient admits he needs help managing medication regimen  Plan -Continue current medications  -Follow up with neurologist around 09/2019  Vitamin D Deficiency   06/14/2011: Vitamin D, 25-hydroxy = 35  Patient has failed these meds in past: None noted Patient is currently controlled on the following medications: cholecalciferol 2000 Units once daily  Plan -Complete vitamin D lab at next office visit -Continue current medications   BPH    08/16/2016: PSA = 4.85  Patient has failed these meds in past: None noted Patient is currently stable on the following medications: None   Overnight Urination Trips: 2-3 (he tries to stop drinking water around 5pm) Trouble starting stream: No  Trouble emptying bladder: No  Patient declines further PSA checking considering his age Had bladder scan at last urology appt and reports he was emptying his bladder well Patient states he is bothered by the  urinary symptoms and would be interested in discussing medication options   We discussed:  Possibility of adding tamsulosin to medication regimen per urology approval/discussion. Upon further investigation after visit, I discovered that terazosin can be used to help with BPH and Parkinson's disease. Will share this information with the patient's mailed AVS to share with urologist. Terazosin's possible PD and urinary symptom benefit would have to be balanced against possible ADRs of postural hypotension (although pt seems hypertensive currently). Pt has hx of glaucoma, but has already had cataract surgery (2009) limiting risk of floppy iris syndrome.   Plan -Follow up with urology and discuss option of tamsulosin or terazosin (preference for terazosin if pt can tolerate) -Continue lifestyle modifications of limiting water in the evening  Lower Extremity Edema    Patient has failed these meds in past: hctz (did not find relief) Patient is currently stable, about the same as previous on the following medications: furosemide 20mg  daily  Doesn't feel like furosemide really assists in reducing swelling.  He stopped taking shortly after it was prescribed. Reports he used it about 1 week or so. He didn't like having "to be connected to the bathroom"  We discussed:  possibility of alternative diuretic that could assist with BP if needed  Plan -Continue current medications   Future Plan -Consider spironolactone for diuretic effect and assistance  with BP if needed  Tinea Corporis     Patient has failed these meds in past:  naftifine 1% cream daily (cost), nystatin powder QID (worked, but was a small quantity)  Patient is currently improved, but rash is still present on the following medications: Currently using miconazole nitrate 2% powder (feels this works well enough) Nystatin powder comes in a small container and does not last him long Requests an alternative to the naftifine cream.  We discussed: keeping his groin area dry (he struggles with this due to urinary leaking), possibility of using Depends to help with remaining dry  Plan -Start lotrimin (butenafine) to help with jock itch -Continue current medications   Glaucoma    Patient has failed these meds in past: None noted Patient is currently controlled on the following medications: latanoprost 0.005% 1 drop OU HS, dorzolamide-timolol 22.3-6.8mg /mL 1 drop OU BID  Dorzoamide/timolol: Takes once a Paolina Karwowski vs twice daily due to tremors and difficulty getting drops into his eyes  Plan -Continue follow up with ophthalmology  -Continue current medications   Constipation    Patient has failed these meds in past: miralax (diarrhea?) Patient is currently controlled on the following medications: glycerin suppository PRN (about once per week)  Reports he stopped taking miralax and docusate about a year ago. "Felt like it was too much". He started to have diarrhea. Has BM about every other Kyanna Mahrt currently When he feels constipated he uses a glycerin suppository. Uses about once per week.   Plan -Continue current medications     Verbal consent obtained for [facility] enhanced pharmacy services (medication synchronization, adherence packaging, delivery coordination). A medication sync plan was created to allow patient to get all medications delivered once every 30 to 90 days per patient preference. Patient understands they have freedom to choose pharmacy and clinical  pharmacist will coordinate care between all prescribers and [facility].

## 2019-06-02 ENCOUNTER — Other Ambulatory Visit: Payer: Self-pay

## 2019-06-02 ENCOUNTER — Ambulatory Visit: Payer: PPO | Admitting: Pharmacist

## 2019-06-02 DIAGNOSIS — K219 Gastro-esophageal reflux disease without esophagitis: Secondary | ICD-10-CM

## 2019-06-02 DIAGNOSIS — I10 Essential (primary) hypertension: Secondary | ICD-10-CM

## 2019-06-02 DIAGNOSIS — E039 Hypothyroidism, unspecified: Secondary | ICD-10-CM

## 2019-06-02 DIAGNOSIS — B354 Tinea corporis: Secondary | ICD-10-CM

## 2019-06-02 DIAGNOSIS — M129 Arthropathy, unspecified: Secondary | ICD-10-CM

## 2019-06-02 DIAGNOSIS — E559 Vitamin D deficiency, unspecified: Secondary | ICD-10-CM

## 2019-06-02 DIAGNOSIS — R350 Frequency of micturition: Secondary | ICD-10-CM

## 2019-06-02 DIAGNOSIS — G20 Parkinson's disease: Secondary | ICD-10-CM

## 2019-06-02 DIAGNOSIS — R600 Localized edema: Secondary | ICD-10-CM

## 2019-06-02 DIAGNOSIS — N401 Enlarged prostate with lower urinary tract symptoms: Secondary | ICD-10-CM

## 2019-06-02 NOTE — Patient Instructions (Addendum)
Visit Information  Goals Addressed            This Visit's Progress   . Blood pressure goal less than 140/90      . Check blood pressure 2-3 times per week      . Complete TSH lab at next office visit      . Complete Vitamin D lab at next office visit      . Discuss medication options with urologist       Tamsulosin: might have less side effects than terazosin, but there is some data with terazosin for patients that have Parkinson's Disease  Terazosin: may cause more low blood pressure, but there is limited data that it could help patients with Parkinson's Disease  These are only recommendations for you to discuss with your urologist. Your urologist is more familiar with your history. I'm happy to help any way that I can.    . Follow up with your neurologist around 09/2019 or sooner      . Pharmacy Care Plan       Current Barriers:  . Chronic Disease Management support, education, and care coordination needs related to HTN, HLD, Hypothyroidism, Parkinson Disease, Vitamin D Deficiency, BPH, Lower Extremity Edema, Tinea Corporis, Glaucoma, Constipation  Pharmacist Clinical Goal(s):  Marland Kitchen Over the next 90 days, patient will demonstrate Improved medication adherence as evidenced by appropriate fill dates assisted by adherence packaging  . Blood pressure goal <140/90 . LDL goal <100 . TSH WNL . Reduced urinary symptoms due to BPH . Follow up with neurologist by 09/2019 or earlier  Interventions: . Comprehensive medication review performed. . Start taking levothyroxine first thing in the morning at least 30 minutes before food or any other medications . Complete TSH lab at next office visit . Complete Vitamin D lab at next office visit . Check blood pressure 2-3 times per week . Start lotrimin (butenafine) to help with rash . Discuss medication options with urologist (possibility of tamsulosin vs terazosin)  Patient Self Care Activities:  . Patient verbalizes understanding of plan to  follow as described above, Self administers medications as prescribed, Calls pharmacy for medication refills, and Calls provider office for new concerns or questions  Initial goal documentation     . Start Lotrimin (butenafine) to help with rash      . Take levothyroxine 1st thing in the morning before any other medications or food         William Williamson was given information about Chronic Care Management services today including:  1. CCM service includes personalized support from designated clinical staff supervised by his physician, including individualized plan of care and coordination with other care providers 2. 24/7 contact phone numbers for assistance for urgent and routine care needs. 3. Service will only be billed when office clinical staff spend 20 minutes or more in a month to coordinate care. 4. Only one practitioner may furnish and bill the service in a calendar month. 5. The patient may stop CCM services at any time (effective at the end of the month) by phone call to the office staff. The patient will be responsible for cost sharing (co-pay) of up to 20% of the service fee (after annual deductible is met).  Verbal consent obtained for [facility] enhanced pharmacy services (medication synchronization, adherence packaging, delivery coordination). A medication sync plan was created to allow patient to get all medications delivered once every 30 to 90 days per patient preference. Patient understands they have freedom to choose pharmacy  and clinical pharmacist will coordinate care between all prescribers and [facility].   Patient agreed to services and verbal consent obtained.   The patient verbalized understanding of instructions provided today and agreed to receive a mailed copy of patient instruction and/or educational materials. The pharmacy team will reach out to the patient again over the next 30 days.   De Blanch, PharmD Clinical Pharmacist [facility] [phone]   Blood Pressure Record Sheet To take your blood pressure, you will need a blood pressure machine. You can buy a blood pressure machine (blood pressure monitor) at your clinic, drug store, or online. When choosing one, consider:  An automatic monitor that has an arm cuff.  A cuff that wraps snugly around your upper arm. You should be able to fit only one finger between your arm and the cuff.  A device that stores blood pressure reading results.  Do not choose a monitor that measures your blood pressure from your wrist or finger. Follow your health care provider's instructions for how to take your blood pressure. To use this form:  Get one reading in the morning (a.m.) before you take any medicines.  Get one reading in the evening (p.m.) before supper.  Take at least 2 readings with each blood pressure check. This makes sure the results are correct. Wait 1-2 minutes between measurements.  Write down the results in the spaces on this form.  Repeat this once a week, or as told by your health care provider.  Make a follow-up appointment with your health care provider to discuss the results. Blood pressure log Date: _______________________  a.m. _____________________(1st reading) _____________________(2nd reading)  p.m. _____________________(1st reading) _____________________(2nd reading) Date: _______________________  a.m. _____________________(1st reading) _____________________(2nd reading)  p.m. _____________________(1st reading) _____________________(2nd reading) Date: _______________________  a.m. _____________________(1st reading) _____________________(2nd reading)  p.m. _____________________(1st reading) _____________________(2nd reading) Date: _______________________  a.m. _____________________(1st reading) _____________________(2nd reading)  p.m. _____________________(1st reading) _____________________(2nd reading) Date:  _______________________  a.m. _____________________(1st reading) _____________________(2nd reading)  p.m. _____________________(1st reading) _____________________(2nd reading) This information is not intended to replace advice given to you by your health care provider. Make sure you discuss any questions you have with your health care provider. Document Revised: 05/17/2017 Document Reviewed: 03/19/2017 Elsevier Patient Education  [place].    How to Take Your Blood Pressure You can take your blood pressure at home with a machine. You may need to check your blood pressure at home:  To check if you have high blood pressure (hypertension).  To check your blood pressure over time.  To make sure your blood pressure medicine is working. Supplies needed: You will need a blood pressure machine, or monitor. You can buy one at a drugstore or online. When choosing one:  Choose one with an arm cuff.  Choose one that wraps around your upper arm. Only one finger should fit between your arm and the cuff.  Do not choose one that measures your blood pressure from your wrist or finger. Your doctor can suggest a monitor. How to prepare Avoid these things for 30 minutes before checking your blood pressure:  Drinking caffeine.  Drinking alcohol.  Eating.  Smoking.  Exercising. Five minutes before checking your blood pressure:  Pee.  Sit in a dining chair. Avoid sitting in a soft couch or armchair.  Be quiet. Do not talk. How to take your blood pressure Follow the instructions that came with your machine. If you have a digital blood pressure monitor, these may  be the instructions: 1. Sit up straight. 2. Place your feet on the floor. Do not cross your ankles or legs. 3. Rest your left arm at the level of your heart. You may rest it on a table, desk, or chair. 4. Pull up your shirt sleeve. 5. Wrap the blood pressure cuff around the upper part of your left arm. The cuff should be  1 inch (2.5 cm) above your elbow. It is best to wrap the cuff around bare skin. 6. Fit the cuff snugly around your arm. You should be able to place only one finger between the cuff and your arm. 7. Put the cord inside the groove of your elbow. 8. Press the power button. 9. Sit quietly while the cuff fills with air and loses air. 10. Write down the numbers on the screen. 11. Wait 2-3 minutes and then repeat steps 1-10. What do the numbers mean? Two numbers make up your blood pressure. The first number is called systolic pressure. The second is called diastolic pressure. An example of a blood pressure reading is "120 over 80" (or 120/80). If you are an adult and do not have a medical condition, use this guide to find out if your blood pressure is normal: Normal  First number: below 120.  Second number: below 80. Elevated  First number: 120-129.  Second number: below 80. Hypertension stage 1  First number: 130-139.  Second number: 80-89. Hypertension stage 2  First number: 140 or above.  Second number: 14 or above. Your blood pressure is above normal even if only the top or bottom number is above normal. Follow these instructions at home:  Check your blood pressure as often as your doctor tells you to.  Take your monitor to your next doctor's appointment. Your doctor will: ? Make sure you are using it correctly. ? Make sure it is working right.  Make sure you understand what your blood pressure numbers should be.  Tell your doctor if your medicines are causing side effects. Contact a doctor if:  Your blood pressure keeps being high. Get help right away if:  Your first blood pressure number is higher than 180.  Your second blood pressure number is higher than 120. This information is not intended to replace advice given to you by your health care provider. Make sure you discuss any questions you have with your health care provider. Document Revised: 03/01/2017 Document  Reviewed: 08/26/2015 Elsevier Patient Education  2020 [employer].

## 2019-06-03 ENCOUNTER — Other Ambulatory Visit: Payer: Self-pay

## 2019-06-03 DIAGNOSIS — E785 Hyperlipidemia, unspecified: Secondary | ICD-10-CM

## 2019-06-03 DIAGNOSIS — R600 Localized edema: Secondary | ICD-10-CM

## 2019-06-03 MED ORDER — SIMVASTATIN 20 MG PO TABS: 20.0000 mg | ORAL_TABLET | Freq: Every evening | ORAL | 0 refills | Status: DC

## 2019-06-03 MED ORDER — FUROSEMIDE 20 MG PO TABS: 20.0000 mg | ORAL_TABLET | Freq: Every day | ORAL | 3 refills | Status: DC

## 2019-06-03 MED ORDER — LEVOTHYROXINE SODIUM 100 MCG PO TABS: 100.0000 ug | ORAL_TABLET | Freq: Every day | ORAL | 2 refills | Status: DC

## 2019-06-19 ENCOUNTER — Other Ambulatory Visit: Payer: Self-pay | Admitting: Family Medicine

## 2019-06-19 DIAGNOSIS — E039 Hypothyroidism, unspecified: Secondary | ICD-10-CM

## 2019-06-19 DIAGNOSIS — E559 Vitamin D deficiency, unspecified: Secondary | ICD-10-CM

## 2019-06-19 DIAGNOSIS — E785 Hyperlipidemia, unspecified: Secondary | ICD-10-CM

## 2019-06-19 DIAGNOSIS — R600 Localized edema: Secondary | ICD-10-CM

## 2019-06-19 MED ORDER — LEVOTHYROXINE SODIUM 100 MCG PO TABS: 100.0000 ug | ORAL_TABLET | Freq: Every day | ORAL | 3 refills | Status: DC

## 2019-06-19 MED ORDER — SIMVASTATIN 20 MG PO TABS: 20.0000 mg | ORAL_TABLET | Freq: Every evening | ORAL | 3 refills | Status: DC

## 2019-06-19 MED ORDER — FUROSEMIDE 20 MG PO TABS: 20.0000 mg | ORAL_TABLET | Freq: Every day | ORAL | 3 refills | Status: DC

## 2019-08-14 ENCOUNTER — Other Ambulatory Visit: Payer: Self-pay | Admitting: Family Medicine

## 2019-08-14 ENCOUNTER — Other Ambulatory Visit: Payer: Self-pay | Admitting: Neurology

## 2019-08-14 DIAGNOSIS — R600 Localized edema: Secondary | ICD-10-CM

## 2019-08-14 DIAGNOSIS — E785 Hyperlipidemia, unspecified: Secondary | ICD-10-CM

## 2019-08-28 ENCOUNTER — Telehealth: Payer: Self-pay

## 2019-08-28 NOTE — Telephone Encounter (Signed)
Patient states he drools so much on his clothes and when he wakes up in the morning theres a puddle in the bed.   He wants to know how much Botox will cost.   Interested in having Botox to help with drooling.   Is this ok?

## 2019-08-28 NOTE — Telephone Encounter (Signed)
Received voicemail from patient who states he is drooling so bad that it is becoming embarrassing. He states he was told she could do Botox to help with drooling. He would like a call back to discuss this.

## 2019-09-01 NOTE — Telephone Encounter (Signed)
I don't know his personal out of pocket cost.  Will need to discuss that with his insurance company but Manuela Schwartz can give them the codes we use [phone] and [id number] x 50 , I believe) Okay to schedule if get approval

## 2019-09-01 NOTE — Telephone Encounter (Signed)
Spoke with patient and informed him that Dr Tat is agreeable with him having Botox. Informed him that someone from the billing dept will try to get an approval and once we receive an approval from his insurance one of schedulers will contact him to schedule an appt. Patient voiced understanding.

## 2019-09-02 ENCOUNTER — Encounter: Payer: Self-pay | Admitting: *Deleted

## 2019-09-02 NOTE — Progress Notes (Signed)
Submitted benefit verification via Myobloc website Waiting for determination

## 2019-09-04 NOTE — Progress Notes (Addendum)
Received paperwork from [employer] stating patient is covered buy and bill on his Myobloc through his medicare ins.  Sent paperwork to scanning.   LMOM with patient to call us back and scheduled appt and informed front desk of his needs.

## 2019-10-01 ENCOUNTER — Ambulatory Visit (INDEPENDENT_AMBULATORY_CARE_PROVIDER_SITE_OTHER): Payer: PPO | Admitting: Neurology

## 2019-10-01 ENCOUNTER — Other Ambulatory Visit: Payer: Self-pay

## 2019-10-01 DIAGNOSIS — K117 Disturbances of salivary secretion: Secondary | ICD-10-CM | POA: Diagnosis not present

## 2019-10-01 MED ORDER — RIMABOTULINUMTOXINB 5000 UNIT/ML IM SOLN
5000.0000 [IU] | Freq: Once | INTRAMUSCULAR | Status: AC
  Administered 2019-10-01: 5000 [IU] via INTRAMUSCULAR

## 2019-10-01 NOTE — Procedures (Signed)
[facility]    History:  Diagnosis: Sialorrhea    Result History  Onset of effect:n/a  Consent obtained from: The patient The patient was educated on the botulinum toxin the black blox warning and given a copy of the botox patient medication guide.  The patient understands that this warning states that there have been reported cases of the Botox extending beyond the injection site and creating adverse effects, similar to those of botulism. This included loss of strength, trouble walking, hoarseness, trouble saying words clearly, loss of bladder control, trouble breathing, trouble swallowing, diplopia, blurry vision and ptosis. Most of the distant spread of Botox was happening in patients, primarily children, who received medication for spasticity or for cervical dystonia. The patient expressed understanding and desire to proceed.     Injections  Location Left  Right Units Number of sites  Submandibular gland 250 250 500 1 per side  Parotid 2250 2250 2500 1 per side  TOTAL UNITS:     5000      Type of Toxin: Myobloc type B As ordered and injected IM at today's visit Total Units: 5000  Discarded Units: 0  Needle drawback with each injection was free of blood. Pt tolerated procedure well without complications.   Reinjection is anticipated in 3 months.

## 2019-10-09 NOTE — Progress Notes (Signed)
Assessment/Plan:   1.  Parkinsons Disease  -increase carbidopa/levodopa to 2 tablets at 7am/11am/3pm and 1 tablet at 7pm  -add carbidopa/levodopa 50/200 CR at bedtime  -needs to use walker at all times.  -refer for home PT (his insurance co may be a challenge here)  2.  Sialorrhea  -Continue with Myobloc.  Last injections on July 1, which were his first injections.  Will let me know if mouth stays dry and may have to decrease dose of myobloc  3.  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  Insomnia  -start melatonin 3mg  up to 8mg  at bedtime   Subjective:   William Williamson was seen today in follow up for Parkinsons disease.  My previous records were reviewed prior to todays visit as well as outside records available to me. This patient is accompanied in the office by his spouse who supplements the history. Patient accompanied by his son who supplements the history.  Patient's last Botox for sialorrhea on July 1.  This was his first series of injections.  He reports that it helped but made mouth dry. pt reports 2 falls in the last week and several others.  He has a walker but resistant to use it.  Never fallen with the walker.  Most falls are in the kitchen.  Has not done any PT.  Uses stationary bike at home 1 time per day for about 15 min.  Pt has lightheadedness when first gets up, but no near syncope.  BP when first up in the AM runs in the 90's.  No hallucinations.  Mood has been good.  Current prescribed movement disorder medications: Carbidopa/levodopa 25/100, 2 tablets 3 times per day   ALLERGIES:   Allergies  Allergen Reactions  . Garlic Nausea Only  . Niacin     REACTION: flushing    CURRENT MEDICATIONS:  Outpatient Encounter Medications as of 10/14/2019  Medication Sig  . carbidopa-levodopa (SINEMET IR) 25-100 MG tablet TAKE 2 TABLETS BY MOUTH THREE TIMES DAILY  . Cholecalciferol 2000 UNITS CAPS Take 1 capsule by mouth daily.   Marland Kitchen  docusate sodium (COLACE) 100 MG capsule Take 200 mg by mouth daily as needed for mild constipation.  . dorzolamide-timolol (COSOPT) 22.3-6.8 MG/ML ophthalmic solution Place 1 drop into both eyes 2 (two) times daily.   . furosemide (LASIX) 20 MG tablet Take 1 tablet by mouth once daily  . latanoprost (XALATAN) 0.005 % ophthalmic solution Place 1 drop into both eyes at bedtime.   Marland Kitchen levothyroxine (SYNTHROID) 100 MCG tablet Take 1 tablet (100 mcg total) by mouth daily before breakfast.  . naftifine (NAFTIN) 1 % cream Apply topically daily.  . NONFORMULARY OR COMPOUNDED ITEM Walker  #1   Dx parkinson disease  . nystatin (NYSTATIN) powder Apply topically 4 (four) times daily.  . polyethylene glycol (MIRALAX / GLYCOLAX) packet Take 17 g by mouth daily as needed.   . simvastatin (ZOCOR) 20 MG tablet TAKE 1 TABLET BY MOUTH ONCE DAILY IN THE EVENING   No facility-administered encounter medications on file as of 10/14/2019.    Objective:   PHYSICAL EXAMINATION:    VITALS:   Vitals:   10/14/19 0908  BP: 135/87  Pulse: 68  Resp: 20  SpO2: 99%  Weight: 174 lb (78.9 kg)  Height: 6\' 1"  (1.854 m)    GEN:  The patient appears stated age and is in NAD. HEENT:  Normocephalic, atraumatic.  The mucous membranes are moist. The superficial temporal arteries  are without ropiness or tenderness. CV:  RRR Lungs:  CTAB Neck/HEME:  There are no carotid bruits bilaterally.  Neurological examination:  Orientation: The patient is alert and oriented x3. Cranial nerves: There is good facial symmetry with significant facial hypomimia. The speech is fluent and clear. Soft palate rises symmetrically and there is no tongue deviation. Hearing is intact to conversational tone. Sensation: Sensation is intact to light touch throughout Motor: Strength is at least antigravity x4.  Movement examination: Tone: There is increased tone in the bilateral upper extremities, more so on the left Abnormal movements: there is  RUE rest tremor; he has mild truncal dyskinesia Coordination:  There is decremation with RAM's, esp with toe taps Gait and Station: The patient has  difficulty arising out of a deep-seated chair without the use of the hands. 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  He is short stepped and quite unstable.  He is ataxic.  He has significant trouble with the turns.  He is short stepped and ataxic.  He has significant trouble with the turns.  I have reviewed and interpreted the following labs independently    Chemistry      Component Value Date/Time   NA 137 03/03/2019 1358   K 4.7 03/03/2019 1358   CL 99 03/03/2019 1358   CO2 30 03/03/2019 1358   BUN 20 03/03/2019 1358   CREATININE 1.20 03/03/2019 1358      Component Value Date/Time   CALCIUM 9.4 03/03/2019 1358   ALKPHOS 49 03/03/2019 1358   AST 20 03/03/2019 1358   ALT 5 03/03/2019 1358   BILITOT 0.8 03/03/2019 1358       Lab Results  Component Value Date   WBC 9.9 03/03/2019   HGB 14.1 03/03/2019   HCT 41.8 03/03/2019   MCV 94.8 03/03/2019   PLT 204.0 03/03/2019    Lab Results  Component Value Date   TSH 16.50 (H) 03/03/2019     Total time spent on today's visit was 45 minutes, including both face-to-face time and nonface-to-face time.  Time included that spent on review of records (prior notes available to me/labs/imaging if pertinent), discussing treatment and goals, answering patient's questions and coordinating care.  Cc:  [name], DO

## 2019-10-14 ENCOUNTER — Other Ambulatory Visit: Payer: Self-pay

## 2019-10-14 ENCOUNTER — Encounter: Payer: Self-pay | Admitting: Neurology

## 2019-10-14 ENCOUNTER — Ambulatory Visit (INDEPENDENT_AMBULATORY_CARE_PROVIDER_SITE_OTHER): Payer: PPO | Admitting: Neurology

## 2019-10-14 VITALS — BP 135/87 | HR 68 | Resp 20 | Ht 73.0 in | Wt 174.0 lb

## 2019-10-14 DIAGNOSIS — G20 Parkinson's disease: Secondary | ICD-10-CM

## 2019-10-14 DIAGNOSIS — G903 Multi-system degeneration of the autonomic nervous system: Secondary | ICD-10-CM | POA: Diagnosis not present

## 2019-10-14 DIAGNOSIS — K117 Disturbances of salivary secretion: Secondary | ICD-10-CM | POA: Diagnosis not present

## 2019-10-14 MED ORDER — CARBIDOPA-LEVODOPA 25-100 MG PO TABS: ORAL_TABLET | ORAL | 1 refills | Status: DC

## 2019-10-14 MED ORDER — CARBIDOPA-LEVODOPA ER 50-200 MG PO TBCR
1.0000 | EXTENDED_RELEASE_TABLET | Freq: Every day | ORAL | 1 refills | Status: DC
Start: 2019-10-14 — End: 2020-07-11

## 2019-10-14 NOTE — Patient Instructions (Addendum)
1.  increase carbidopa/levodopa to 2 tablets at 7am/11am/3pm and 1 tablet at 7pm 2.  add carbidopa/levodopa 50/200 CR at bedtime 3.  Increase your water intake  We will send an order for PT to the home.    The physicians and staff at [facility] are committed to providing excellent care. You may receive a survey requesting feedback about your experience at our office. We strive to receive "very good" responses to the survey questions. If you feel that your experience would prevent you from giving the office a "very good " response, please contact our office to try to remedy the situation. We may be reached at [phone]. Thank you for taking the time out of your busy day to complete the survey.

## 2019-10-14 NOTE — Addendum Note (Signed)
Addended by: Ulice Brilliant T on: 10/14/2019 11:45 AM   Modules accepted: Orders

## 2019-10-22 DIAGNOSIS — H401131 Primary open-angle glaucoma, bilateral, mild stage: Secondary | ICD-10-CM | POA: Diagnosis not present

## 2019-10-24 ENCOUNTER — Other Ambulatory Visit: Payer: Self-pay | Admitting: Family Medicine

## 2019-10-24 DIAGNOSIS — E785 Hyperlipidemia, unspecified: Secondary | ICD-10-CM

## 2019-11-04 DIAGNOSIS — H401131 Primary open-angle glaucoma, bilateral, mild stage: Secondary | ICD-10-CM | POA: Diagnosis not present

## 2019-11-12 DIAGNOSIS — H401131 Primary open-angle glaucoma, bilateral, mild stage: Secondary | ICD-10-CM | POA: Diagnosis not present

## 2019-11-26 ENCOUNTER — Other Ambulatory Visit: Payer: Self-pay | Admitting: Family Medicine

## 2019-11-26 DIAGNOSIS — E039 Hypothyroidism, unspecified: Secondary | ICD-10-CM

## 2019-12-08 ENCOUNTER — Ambulatory Visit: Payer: PPO | Admitting: Pharmacist

## 2019-12-08 DIAGNOSIS — E039 Hypothyroidism, unspecified: Secondary | ICD-10-CM

## 2019-12-08 DIAGNOSIS — I10 Essential (primary) hypertension: Secondary | ICD-10-CM

## 2019-12-08 DIAGNOSIS — G20 Parkinson's disease: Secondary | ICD-10-CM

## 2019-12-08 NOTE — Chronic Care Management (AMB) (Signed)
Chronic Care Management Pharmacy  Name: William Williamson  MRN: [id number] DOB: 1932-11-25  Chief Complaint/ HPI  William Williamson,  86 y.o. , male presents for their Initial CCM visit with the clinical pharmacist via telephone due to COVID-19 Pandemic.  PCP : [name], DO  Their chronic conditions include: HTN, HLD, Hypothyroidism, Parkinson Disease, Vitamin D Deficiency, BPH, Lower Extremity Edema, Tinea Corporis, Glaucoma, Constipation  Office Visits: None since last CCM visit on 06/02/19.   Consult Visit: 10/14/19: Neurology visit w/ Dr. Carles Collet - Parkinson's Disease - Increase carbidopa/levodopa to 2 tabs at 7am/11am/3pm and 1 tab at 7pm. Add carb/levo 50/200 CR at bedtime. Use walker at all times. Refer to home PT. Continue myobloc for sialorrhea. Increase hydration for probable neurogenic orthostatic hypotension. Start melatonin 3mg  to 8mg  at bedtime for insomnia.  10/01/19: Myobloc by Dr. Carles Collet  Medications: Outpatient Encounter Medications as of 12/08/2019  Medication Sig Note   carbidopa-levodopa (SINEMET CR) 50-200 MG tablet Take 1 tablet by mouth at bedtime.    Cholecalciferol 2000 UNITS CAPS Take 1 capsule by mouth daily.     docusate sodium (COLACE) 100 MG capsule Take 200 mg by mouth daily as needed for mild constipation.    dorzolamide-timolol (COSOPT) 22.3-6.8 MG/ML ophthalmic solution Place 1 drop into both eyes 2 (two) times daily.  06/02/2019: Uses once daily due to difficulty with tremor    furosemide (LASIX) 20 MG tablet Take 1 tablet by mouth once daily    latanoprost (XALATAN) 0.005 % ophthalmic solution Place 1 drop into both eyes at bedtime.     naftifine (NAFTIN) 1 % cream Apply topically daily. 06/02/2019: Did not pick up due to cost   NONFORMULARY OR COMPOUNDED ITEM Walker  #1   Dx parkinson disease    nystatin (NYSTATIN) powder Apply topically 4 (four) times daily.    polyethylene glycol (MIRALAX / GLYCOLAX) packet Take 17 g by mouth daily  as needed.  06/02/2019: Uses glycerin suppository when he is constipated around once per week. Felt miralax was too much   [DISCONTINUED] carbidopa-levodopa (SINEMET IR) 25-100 MG tablet 2 tablets at 7am/11am/3pm and 1 tablet at 7pm    [DISCONTINUED] EUTHYROX 100 MCG tablet TAKE 1 TABLET BY MOUTH ONCE DAILY BEFORE BREAKFAST    [DISCONTINUED] simvastatin (ZOCOR) 20 MG tablet TAKE 1 TABLET BY MOUTH ONCE DAILY IN THE EVENING    No facility-administered encounter medications on file as of 12/08/2019.   SDOH Screenings   Alcohol Screen:    Last Alcohol Screening Score (AUDIT): Not on file  Depression (PHQ2-9):    PHQ-2 Score: Not on file  Financial Resource Strain:    Difficulty of Paying Living Expenses: Not on file  Food Insecurity:    Worried About [place] in the Last Year: Not on file   Ran Out of Food in the Last Year: Not on file  Housing:    Last Housing Risk Score: Not on file  Physical Activity:    Days of Exercise per Week: Not on file   Minutes of Exercise per Session: Not on file  Social Connections:    Frequency of Communication with Friends and Family: Not on file   Frequency of Social Gatherings with Friends and Family: Not on file   Attends Religious Services: Not on file   Active Member of Clubs or Organizations: Not on file   Attends Archivist Meetings: Not on file   Marital Status: Not on file  Stress:  Feeling of Stress : Not on file  Tobacco Use: Medium Risk   Smoking Tobacco Use: Former Smoker   Smokeless Tobacco Use: Never Used  Transportation Needs:    Film/video editor (Medical): Not on file   Lack of Transportation (Non-Medical): Not on file     Current Diagnosis/Assessment:  Goals Addressed            This Visit's Progress    Chronic Care Management Pharmacy Care Plan       CARE PLAN ENTRY (see longitudinal plan of care for additional care plan information)  Current Barriers:   Chronic  Disease Management support, education, and care coordination needs related to HTN, HLD, Hypothyroidism, Parkinson Disease, Vitamin D Deficiency, BPH, Lower Extremity Edema, Tinea Corporis, Glaucoma, Constipation   Hypertension BP Readings from Last 3 Encounters:  10/14/19 135/87  03/03/19 (!) 160/100  12/30/18 (!) 170/90    Pharmacist Clinical Goal(s): o Over the next 90 days, patient will work with PharmD and providers to maintain BP goal <140/90  Current regimen:  o Diet and exercise management    Interventions: o Requested patient to check his blood pressure 2-3 times per week  Patient self care activities - Over the next 90 days, patient will: o Check BP 2-3 times per week, document, and provide at future appointments o Ensure daily salt intake < 2300 mg/Yarithza Mink  Hyperlipidemia Lab Results  Component Value Date/Time   LDLCALC 90 03/03/2019 01:58 PM    Pharmacist Clinical Goal(s): o Over the next 90 days, patient will work with PharmD and providers to maintain LDL goal < 100  Current regimen:  o Simvastatin 20mg  daily  Patient self care activities - Over the next 90 days, patient will: o Maintain cholesterol medication regimen.   Hypothyroidism  Pharmacist Clinical Goal(s) o Over the next 90 days, patient will work with PharmD and providers to reduce symptoms associated with hypothyroidism  Current regimen:  o Levothyroxine 174mcg daily  Interventions: o Discussed optimal administration of levothyroxine noting abnormal thyroid lab o Encouraged patient to follow up with Dr. Etter Sjogren  Patient self care activities - Over the next 90 days, patient will: o Take levothyroxine optimally o Schedule follow up with Dr. Etter Sjogren to have TSH re-evaluated  Medication management  Pharmacist Clinical Goal(s): o Over the next 90 days, patient will work with PharmD and providers to maintain optimal medication adherence  Current pharmacy: [facility]  Interventions o Comprehensive medication review performed. o Continue current medication management strategy  Patient self care activities - Over the next 90 days, patient will: o Focus on medication adherence by filling and taking medications appropriately  o Take medications as prescribed o Report any questions or concerns to PharmD and/or provider(s)  Please see past updates related to this goal by clicking on the "Past Updates" button in the selected goal       Social Hx: Former Therapist, art x 4 years. 3 sons (1 [place], [place] (Middle son;3 kids; retiring from [employer]), 1 [place] (oldest 3 kids), 1 (youngest in [place], but planning to move in with him)  Uses pill box  Hypertension   CMP Latest Ref Rng & Units 03/03/2019 12/30/2018 09/23/2018  Glucose 70 - 99 mg/dL 84 87 93  BUN 6 - 23 mg/dL 20 15 20   Creatinine 0.40 - 1.50 mg/dL 1.20 1.03 0.99  Sodium 135 - 145 mEq/L 137 137 131(L)  Potassium 3.5 - 5.1 mEq/L 4.7 4.3 4.3  Chloride 96 - 112 mEq/L 99 101 92(L)  CO2 19 - 32 mEq/L 30 31 32  Calcium 8.4 - 10.5 mg/dL 9.4 9.3 9.3  Total Protein 6.0 - 8.3 g/dL 6.9 6.7 -  Total Bilirubin 0.2 - 1.2 mg/dL 0.8 0.6 -  Alkaline Phos 39 - 117 U/L 49 48 -  AST 0 - 37 U/L 20 18 -  ALT 0 - 53 U/L 5 4 -  GFR      57.69   66.84   72.11  BP today is: 174/94 (denies chest pain, SOB, dizziness, or any further alarm symptoms)  Office blood pressures are  BP Readings from Last 3 Encounters:  10/14/19 135/87  03/03/19 (!) 160/100  12/30/18 (!) 170/90    Patient has failed these meds in the past: lisinopril, ramipril (hypotension with course of midodrine at one point)  Patient is currently uncontrolled on the following medications: None  Patient checks BP at home several times per month  Patient home BP readings are ranging: 130/70  We discussed: Proper blood pressure measurement technique  Update 12/08/19 Reports 130/60.  Denies chest pain, headaches, or dizziness (outside of  Parkinsons). States he fell about a month ago. Did not hit his head. Sprains his wrists due to using them to catch his fall. States that it took an hour and a half to get up. States he has an apple watch that will allow him to alert someone if he falls.    Plan -Check blood pressure 2-3 times per week -Continue control with diet and exercise     Hypothyroidism   TSH  Date Value Ref Range Status  03/03/2019 16.50 (H) 0.35 - 4.50 uIU/mL Final     No components found for: T4  Patient has failed these meds in past: None noted Patient is currently uncontrolled per last TSH, medication was increased, need to reassess on the following medications: levothyroxine 166mcg daily  We discussed:  Optimal administration of levothyroxine. Pt reports adherence and states he always takes his levothyroxine with his carbidopa/levadopa. Unsure why acute TSH spike, but recommend taking levothyroxine optimally to reduce this as a variable   Update 12/08/19 Needs to get appt with Dr. Etter Sjogren  Plan -Start taking levothyroxine 1st thing in the morning before any other medications or food by at least 30 minutes -Complete TSH lab at next office visit -Continue current medications  Parkinson Disease    Patient has failed these meds in past: None noted Patient is currently stable on the following medications:   Carbidopa-levodopa IR 25-100mg  2 tabs 7am/11am/3pm and 1 tab at 7pm  Carbidopa-levodopa CR 50-200 1 tab at bedtime  Patient is concerned with decline in functioning.  Carb/Levo: Usually takes 6 tabs per Antonin Meininger. Only takes the #7 tabs every other Kosha Jaquith. "The Shemuel Harkleroad seems to be over before you know what's happened". Does not feel like the medication is making a difference.Has taken ~5 years starting at #2 tabs daily  Patient admits he needs help managing medication regimen  Update 12/08/19 Admits he never gets all of his doses in. States he takes #2-3 of his #5 prescribed doses daily. Does the 7am dose,  7pm dose, and bedtime dose. He doesn't feel he would see a difference even if he took all prescribed doses of medication. Wonders if he does take all the tablets as prescribed if this would make him worse. Eats with one hand. Still has shaking, but feels it is manageable. Recommended that patient set an alarm for his medication dosing and taking his doses consistently for at least  a week to see if this  He would like to continue his medication management as he currently is. "I'm happy with the current treatment"  Plan -Continue current medications   De Blanch, PharmD Clinical Pharmacist [facility] [phone]

## 2019-12-28 ENCOUNTER — Other Ambulatory Visit: Payer: Self-pay | Admitting: Family Medicine

## 2019-12-28 ENCOUNTER — Telehealth: Payer: Self-pay | Admitting: Family Medicine

## 2019-12-28 ENCOUNTER — Other Ambulatory Visit: Payer: Self-pay | Admitting: Neurology

## 2019-12-28 DIAGNOSIS — E039 Hypothyroidism, unspecified: Secondary | ICD-10-CM

## 2019-12-28 DIAGNOSIS — E785 Hyperlipidemia, unspecified: Secondary | ICD-10-CM

## 2019-12-28 MED ORDER — SIMVASTATIN 20 MG PO TABS: 20.0000 mg | ORAL_TABLET | Freq: Every evening | ORAL | 0 refills | Status: DC

## 2019-12-28 MED ORDER — LEVOTHYROXINE SODIUM 100 MCG PO TABS: 100.0000 ug | ORAL_TABLET | Freq: Every day | ORAL | 0 refills | Status: DC

## 2019-12-28 NOTE — Telephone Encounter (Signed)
Patient would like a 90 days supply Medication:  simvastatin (ZOCOR) 20 MG tablet [[id number]]  EUTHYROX 100 MCG tablet [[id number]]      Has the patient contacted their pharmacy?  (If no, request that the patient contact the pharmacy for the refill.) (If yes, when and what did the pharmacy advise?)     Preferred Pharmacy (with phone number or street name):  [facility], [address], [place] [address]  Phone:  [phone] Fax:  [phone]     Agent: Please be advised that RX refills may take up to 3 business days. We ask that you follow-up with your pharmacy.

## 2019-12-28 NOTE — Telephone Encounter (Signed)
Refill sent.

## 2019-12-30 ENCOUNTER — Telehealth: Payer: Self-pay | Admitting: Pharmacist

## 2019-12-30 NOTE — Progress Notes (Addendum)
Verified Adherence Gap Information. [employer] insurance states patient is 100% compliance with Metformin.  [place] Shannon, [facility] Pharmacist Assistant [phone]  Reviewed by: De Blanch, PharmD Clinical Pharmacist [facility] [phone]

## 2019-12-31 NOTE — Patient Instructions (Signed)
Visit Information  Goals Addressed            This Visit's Progress   . Chronic Care Management Pharmacy Care Plan       CARE PLAN ENTRY (see longitudinal plan of care for additional care plan information)  Current Barriers:  . Chronic Disease Management support, education, and care coordination needs related to HTN, HLD, Hypothyroidism, Parkinson Disease, Vitamin D Deficiency, BPH, Lower Extremity Edema, Tinea Corporis, Glaucoma, Constipation   Hypertension BP Readings from Last 3 Encounters:  10/14/19 135/87  03/03/19 (!) 160/100  12/30/18 (!) 170/90   . Pharmacist Clinical Goal(s): o Over the next 90 days, patient will work with PharmD and providers to maintain BP goal <140/90 . Current regimen:  o Diet and exercise management   . Interventions: o Requested patient to check his blood pressure 2-3 times per week . Patient self care activities - Over the next 90 days, patient will: o Check BP 2-3 times per week, document, and provide at future appointments o Ensure daily salt intake < 2300 mg/Keland Peyton  Hyperlipidemia Lab Results  Component Value Date/Time   LDLCALC 90 03/03/2019 01:58 PM   . Pharmacist Clinical Goal(s): o Over the next 90 days, patient will work with PharmD and providers to maintain LDL goal < 100 . Current regimen:  o Simvastatin 20mg  daily . Patient self care activities - Over the next 90 days, patient will: o Maintain cholesterol medication regimen.   Hypothyroidism . Pharmacist Clinical Goal(s) o Over the next 90 days, patient will work with PharmD and providers to reduce symptoms associated with hypothyroidism . Current regimen:  o Levothyroxine 150mcg daily . Interventions: o Discussed optimal administration of levothyroxine noting abnormal thyroid lab o Encouraged patient to follow up with Dr. Etter Sjogren . Patient self care activities - Over the next 90 days, patient will: o Take levothyroxine optimally o Schedule follow up with Dr. Etter Sjogren to have  TSH re-evaluated  Medication management . Pharmacist Clinical Goal(s): o Over the next 90 days, patient will work with PharmD and providers to maintain optimal medication adherence . Current pharmacy: [facility]  . Interventions o Comprehensive medication review performed. o Continue current medication management strategy . Patient self care activities - Over the next 90 days, patient will: o Focus on medication adherence by filling and taking medications appropriately  o Take medications as prescribed o Report any questions or concerns to PharmD and/or provider(s)  Please see past updates related to this goal by clicking on the "Past Updates" button in the selected goal        The patient verbalized understanding of instructions provided today and agreed to receive a mailed copy of patient instruction and/or educational materials.  Telephone follow up appointment with pharmacy team member scheduled for: 03/09/2020  Melvenia Beam Serene Kopf, PharmD Clinical Pharmacist [facility] at [facility] [phone]

## 2020-01-01 ENCOUNTER — Ambulatory Visit: Payer: PPO | Admitting: Neurology

## 2020-01-07 ENCOUNTER — Telehealth: Payer: Self-pay | Admitting: Pharmacist

## 2020-01-07 NOTE — Progress Notes (Addendum)
Chronic Care Management Pharmacy Assistant   Name: William Williamson  MRN: [id number] DOB: 06-13-1932  Reason for Encounter: Disease State  Patient Questions:  1.  Have you seen any other providers since your last visit? No  2.  Any changes in your medicines or health? No  PCP : [name], DO   Their chronic conditions include: HTN, HLD, Hypothyroidism, Parkinson Disease, Vitamin D Deficiency, BPH, Lower Extremity Edema, Tinea Corporis, Glaucoma, Constipation  Office Visits: None since last CCM visit on 12-08-2019.  Consults: None since last CCM visit on 12-08-2019.  Allergies:   Allergies  Allergen Reactions  . Garlic Nausea Only  . Niacin     REACTION: flushing    Medications: Outpatient Encounter Medications as of 01/07/2020  Medication Sig Note  . carbidopa-levodopa (SINEMET CR) 50-200 MG tablet Take 1 tablet by mouth at bedtime.   . carbidopa-levodopa (SINEMET IR) 25-100 MG tablet TAKE 2 TABLETS BY MOUTH THREE TIMES DAILY   . Cholecalciferol 2000 UNITS CAPS Take 1 capsule by mouth daily.    Marland Kitchen docusate sodium (COLACE) 100 MG capsule Take 200 mg by mouth daily as needed for mild constipation.   . dorzolamide-timolol (COSOPT) 22.3-6.8 MG/ML ophthalmic solution Place 1 drop into both eyes 2 (two) times daily.  06/02/2019: Uses once daily due to difficulty with tremor   . furosemide (LASIX) 20 MG tablet Take 1 tablet by mouth once daily   . latanoprost (XALATAN) 0.005 % ophthalmic solution Place 1 drop into both eyes at bedtime.    Marland Kitchen levothyroxine (EUTHYROX) 100 MCG tablet Take 1 tablet (100 mcg total) by mouth daily before breakfast.   . naftifine (NAFTIN) 1 % cream Apply topically daily. 06/02/2019: Did not pick up due to cost  . NONFORMULARY OR COMPOUNDED ITEM Walker  #1   Dx parkinson disease   . nystatin (NYSTATIN) powder Apply topically 4 (four) times daily.   . polyethylene glycol (MIRALAX / GLYCOLAX) packet Take 17 g by mouth daily as needed.  06/02/2019:  Uses glycerin suppository when he is constipated around once per week. Felt miralax was too much  . simvastatin (ZOCOR) 20 MG tablet Take 1 tablet (20 mg total) by mouth every evening.    No facility-administered encounter medications on file as of 01/07/2020.    Current Diagnosis: Patient Active Problem List   Diagnosis Date Noted  . Benign prostatic hyperplasia with urinary frequency 03/03/2019  . Tinea corporis 03/03/2019  . Elevated BP without diagnosis of hypertension 12/30/2018  . Lower extremity edema 09/09/2018  . Otalgia 08/26/2018  . Preventative health care 08/16/2016  . Hypothyroidism 08/16/2016  . Prostate enlargement 08/16/2016  . Autonomic dysfunction 02/02/2016  . Ingrown toenail 05/17/2015  . Right thigh pain 12/24/2014  . H. pylori infection 06/21/2014  . Essential hypertension 02/08/2014  . Hyperlipidemia 02/08/2014  . Parkinson disease ([place]) 12/21/2013  . Hearing loss 10/06/2013  . Paralysis agitans ([place]) 07/28/2013  . UTI (urinary tract infection) 05/27/2013  . Nonspecific abnormal electrocardiogram (ECG) (EKG) 06/14/2011  . Anemia, unspecified 06/13/2010  . CONSTIPATION, CHRONIC 06/09/2009  . Hypothyroidism following radioiodine therapy 07/07/2008  . Vitamin D deficiency 01/09/2008  . HYPERCHOLESTEROLEMIA 07/10/2007  . Arthropathy 07/10/2007  . G E R D 10/11/2006    Goals Addressed   None    Reviewed chart prior to disease state call. Spoke with patient regarding BP  Recent Office Vitals: BP Readings from Last 3 Encounters:  10/14/19 135/87  03/03/19 (!) 160/100  12/30/18 Marland Kitchen)  170/90   Pulse Readings from Last 3 Encounters:  10/14/19 68  03/03/19 67  12/30/18 77    Wt Readings from Last 3 Encounters:  10/14/19 174 lb (78.9 kg)  03/03/19 178 lb 6.4 oz (80.9 kg)  12/30/18 177 lb 9.6 oz (80.6 kg)     Kidney Function Lab Results  Component Value Date/Time   CREATININE 1.20 03/03/2019 01:58 PM   CREATININE 1.03 12/30/2018 10:25 AM   GFR  57.69 (L) 03/03/2019 01:58 PM   GFRNONAA 62.87 06/02/2009 12:44 PM   GFRAA 94 01/09/2008 12:00 AM    BMP Latest Ref Rng & Units 03/03/2019 12/30/2018 09/23/2018  Glucose 70 - 99 mg/dL 84 87 93  BUN 6 - 23 mg/dL 20 15 20   Creatinine 0.40 - 1.50 mg/dL 1.20 1.03 0.99  Sodium 135 - 145 mEq/L 137 137 131(L)  Potassium 3.5 - 5.1 mEq/L 4.7 4.3 4.3  Chloride 96 - 112 mEq/L 99 101 92(L)  CO2 19 - 32 mEq/L 30 31 32  Calcium 8.4 - 10.5 mg/dL 9.4 9.3 9.3    . Current antihypertensive regimen:  o Diet and exercise management   . How often are you checking your Blood Pressure? Patient was asked to check his BP 2-3 times per week. He reports checking weekly, but does not have many readings to provide to me today over the phone.  . Current home BP readings: Patient stated the last blood pressure he recalls was 130/60.  Marland Kitchen What recent interventions/DTPs have been made by any provider to improve Blood Pressure control since last CPP Visit:  None at this time  . Any recent hospitalizations or ED visits since last visit with CPP? No   . What diet changes have been made to improve Blood Pressure Control?  o  Patient states he tries to lessen his salt intake, but does not feel like he has ever been guilty of adding too much salt to his food.  . What exercise is being done to improve your Blood Pressure Control?  o Patient states he has not done much exercise  Patient does have a lab appointment scheduled on 01-18-20 at 8:15 am to have his TSH rechecked  Adherence Review: Is the patient currently on ACE/ARB medication? No Does the patient have >5 day gap between last estimated fill dates? Yes    Follow-Up:  Pharmacist Review   Fanny Skates, [place] Pharmacist Assistant [phone]  Noted >5 day gap history per dispense report. Need to assure adherence. -MGQQ Fill Dates Per Dispense Report - Filled Inappropriately Simvastatin: 12/29/19 - 30DS, 08/17/19 - 90DS  Reviewed by: De Blanch,  PharmD Clinical Pharmacist [facility] [phone]

## 2020-01-15 NOTE — Addendum Note (Signed)
Addended by: Kelle Darting A on: 01/15/2020 09:38 AM   Modules accepted: Orders

## 2020-01-18 ENCOUNTER — Other Ambulatory Visit: Payer: Self-pay

## 2020-01-18 ENCOUNTER — Other Ambulatory Visit (INDEPENDENT_AMBULATORY_CARE_PROVIDER_SITE_OTHER): Payer: PPO

## 2020-01-18 DIAGNOSIS — E559 Vitamin D deficiency, unspecified: Secondary | ICD-10-CM

## 2020-01-18 DIAGNOSIS — E039 Hypothyroidism, unspecified: Secondary | ICD-10-CM | POA: Diagnosis not present

## 2020-01-19 LAB — THYROID PANEL WITH TSH
Free Thyroxine Index: 2.3 (ref 1.4–3.8)
T3 Uptake: 37 % — ABNORMAL HIGH (ref 22–35)
T4, Total: 6.1 ug/dL (ref 4.9–10.5)
TSH: 2.8 mIU/L (ref 0.40–4.50)

## 2020-01-19 LAB — VITAMIN D 25 HYDROXY (VIT D DEFICIENCY, FRACTURES): Vit D, 25-Hydroxy: 43 ng/mL (ref 30–100)

## 2020-02-09 ENCOUNTER — Telehealth: Payer: Self-pay | Admitting: Family Medicine

## 2020-02-09 DIAGNOSIS — E039 Hypothyroidism, unspecified: Secondary | ICD-10-CM

## 2020-02-09 MED ORDER — LEVOTHYROXINE SODIUM 100 MCG PO TABS: 100.0000 ug | ORAL_TABLET | Freq: Every day | ORAL | 1 refills | Status: DC

## 2020-02-09 NOTE — Telephone Encounter (Signed)
Patient would like a 90 days supply  Medication:  levothyroxine (EUTHYROX) 100 MCG tablet [[id number]]      Has the patient contacted their pharmacy?  (If no, request that the patient contact the pharmacy for the refill.) (If yes, when and what did the pharmacy advise?)     Preferred Pharmacy (with phone number or street name):   [place], [place], [place] [address]  Phone:  [phone] Fax:  [phone]   Agent: Please be advised that RX refills may take up to 3 business days. We ask that you follow-up with your pharmacy.

## 2020-02-09 NOTE — Telephone Encounter (Signed)
Refill sent.

## 2020-02-12 ENCOUNTER — Telehealth: Payer: Self-pay | Admitting: Pharmacist

## 2020-02-12 NOTE — Progress Notes (Signed)
    Chronic Care Management Pharmacy Assistant   Name: William Williamson  MRN: [id number] DOB: 08/16/1932  Reason for Encounter: Medication Review  PCP : [name], DO  Allergies:   Allergies  Allergen Reactions  . Garlic Nausea Only  . Niacin     REACTION: flushing    Medications: Outpatient Encounter Medications as of 02/12/2020  Medication Sig Note  . carbidopa-levodopa (SINEMET CR) 50-200 MG tablet Take 1 tablet by mouth at bedtime.   . carbidopa-levodopa (SINEMET IR) 25-100 MG tablet TAKE 2 TABLETS BY MOUTH THREE TIMES DAILY   . Cholecalciferol 2000 UNITS CAPS Take 1 capsule by mouth daily.    Marland Kitchen docusate sodium (COLACE) 100 MG capsule Take 200 mg by mouth daily as needed for mild constipation.   . dorzolamide-timolol (COSOPT) 22.3-6.8 MG/ML ophthalmic solution Place 1 drop into both eyes 2 (two) times daily.  06/02/2019: Uses once daily due to difficulty with tremor   . furosemide (LASIX) 20 MG tablet Take 1 tablet by mouth once daily   . latanoprost (XALATAN) 0.005 % ophthalmic solution Place 1 drop into both eyes at bedtime.    Marland Kitchen levothyroxine (EUTHYROX) 100 MCG tablet Take 1 tablet (100 mcg total) by mouth daily before breakfast.   . naftifine (NAFTIN) 1 % cream Apply topically daily. 06/02/2019: Did not pick up due to cost  . NONFORMULARY OR COMPOUNDED ITEM Walker  #1   Dx parkinson disease   . nystatin (NYSTATIN) powder Apply topically 4 (four) times daily.   . polyethylene glycol (MIRALAX / GLYCOLAX) packet Take 17 g by mouth daily as needed.  06/02/2019: Uses glycerin suppository when he is constipated around once per week. Felt miralax was too much  . simvastatin (ZOCOR) 20 MG tablet Take 1 tablet (20 mg total) by mouth every evening.    No facility-administered encounter medications on file as of 02/12/2020.    Current Diagnosis: Patient Active Problem List   Diagnosis Date Noted  . Benign prostatic hyperplasia with urinary frequency 03/03/2019  . Tinea  corporis 03/03/2019  . Elevated BP without diagnosis of hypertension 12/30/2018  . Lower extremity edema 09/09/2018  . Otalgia 08/26/2018  . Preventative health care 08/16/2016  . Hypothyroidism 08/16/2016  . Prostate enlargement 08/16/2016  . Autonomic dysfunction 02/02/2016  . Ingrown toenail 05/17/2015  . Right thigh pain 12/24/2014  . H. pylori infection 06/21/2014  . Essential hypertension 02/08/2014  . Hyperlipidemia 02/08/2014  . Parkinson disease ([place]) 12/21/2013  . Hearing loss 10/06/2013  . Paralysis agitans ([place]) 07/28/2013  . UTI (urinary tract infection) 05/27/2013  . Nonspecific abnormal electrocardiogram (ECG) (EKG) 06/14/2011  . Anemia, unspecified 06/13/2010  . CONSTIPATION, CHRONIC 06/09/2009  . Hypothyroidism following radioiodine therapy 07/07/2008  . Vitamin D deficiency 01/09/2008  . HYPERCHOLESTEROLEMIA 07/10/2007  . Arthropathy 07/10/2007  . G E R D 10/11/2006    Goals Addressed   None    Reviewing adherence to Simvastatin 20 mg Patient needs to make appointment with PCP to get further refills.   Spoke with patient and reported he needed appointment.  Patient was under the impression that CPP appointment was enough.  Discussed the difference of a PCP appointment vs CPP appoitment.  Patient verbalizes understanding.  Advised patient PCP nurse will contact them for an appointment.  Sent message to PCP nurse.   Follow-Up:  Pharmacist Review   [place] Shannon, [facility] [place] Pharmacist Assistant [phone]

## 2020-02-29 DIAGNOSIS — H34232 Retinal artery branch occlusion, left eye: Secondary | ICD-10-CM | POA: Diagnosis not present

## 2020-03-02 ENCOUNTER — Other Ambulatory Visit: Payer: Self-pay

## 2020-03-02 ENCOUNTER — Other Ambulatory Visit: Payer: PPO

## 2020-03-08 DIAGNOSIS — Z1283 Encounter for screening for malignant neoplasm of skin: Secondary | ICD-10-CM | POA: Diagnosis not present

## 2020-03-08 DIAGNOSIS — D225 Melanocytic nevi of trunk: Secondary | ICD-10-CM | POA: Diagnosis not present

## 2020-03-08 DIAGNOSIS — L570 Actinic keratosis: Secondary | ICD-10-CM | POA: Diagnosis not present

## 2020-03-08 DIAGNOSIS — X32XXXD Exposure to sunlight, subsequent encounter: Secondary | ICD-10-CM | POA: Diagnosis not present

## 2020-03-09 ENCOUNTER — Ambulatory Visit: Payer: PPO | Admitting: Pharmacist

## 2020-03-09 DIAGNOSIS — G20 Parkinson's disease: Secondary | ICD-10-CM

## 2020-03-09 DIAGNOSIS — I10 Essential (primary) hypertension: Secondary | ICD-10-CM

## 2020-03-09 DIAGNOSIS — E039 Hypothyroidism, unspecified: Secondary | ICD-10-CM

## 2020-03-09 NOTE — Patient Instructions (Addendum)
Visit Information  Goals Addressed            This Visit's Progress   . Chronic Care Management Pharmacy Care Plan       CARE PLAN ENTRY (see longitudinal plan of care for additional care plan information)  Current Barriers:  . Chronic Disease Management support, education, and care coordination needs related to HTN, HLD, Hypothyroidism, Parkinson Disease, Vitamin D Deficiency, BPH, Lower Extremity Edema, Tinea Corporis, Glaucoma, Constipation   Hypertension BP Readings from Last 3 Encounters:  10/14/19 135/87  03/03/19 (!) 160/100  12/30/18 (!) 170/90   . Pharmacist Clinical Goal(s): o Over the next 90 days, patient will work with PharmD and providers to maintain BP goal <140/90 . Current regimen:  o Diet and exercise management   . Interventions: o Requested patient to check his blood pressure 2-3 times per week . Patient self care activities - Over the next 90 days, patient will: o Check BP 2-3 times per week, document, and provide at future appointments o Ensure daily salt intake < 2300 mg/William Williamson  Hyperlipidemia Lab Results  Component Value Date/Time   LDLCALC 90 03/03/2019 01:58 PM   . Pharmacist Clinical Goal(s): o Over the next 90 days, patient will work with PharmD and providers to maintain LDL goal < 100 . Current regimen:  o Simvastatin 20mg  daily . Patient self care activities - Over the next 90 days, patient will: o Maintain cholesterol medication regimen.   Hypothyroidism . Pharmacist Clinical Goal(s) o Over the next 90 days, patient will work with PharmD and providers to reduce symptoms associated with hypothyroidism . Current regimen:  o Levothyroxine 149mcg daily . Interventions: o Discussed optimal administration of levothyroxine noting abnormal thyroid lab o Encouraged patient to follow up with Dr. Etter Sjogren . Patient self care activities - Over the next 90 days, patient will: o Take levothyroxine optimally o Schedule follow up with Dr. Etter Sjogren to have  TSH re-evaluated  Health Maintenance  . Pharmacist Clinical Goal(s) o Over the next 90 days, patient will work with PharmD and providers to complete health maintenance screenings/vaccinations . Interventions: o Recommended patient receive flu vaccine o Recommended patient receive covid vaccine booster . Patient self care activities - Over the next 90 days, patient will: o Receive flu vaccine o Receive covid vaccine booster    Medication management . Pharmacist Clinical Goal(s): o Over the next 90 days, patient will work with PharmD and providers to maintain optimal medication adherence . Current pharmacy: [facility]  . Interventions o Comprehensive medication review performed. o Continue current medication management strategy . Patient self care activities - Over the next 90 days, patient will: o Focus on medication adherence by filling and taking medications appropriately  o Take medications as prescribed o Report any questions or concerns to PharmD and/or provider(s)  Please see past updates related to this goal by clicking on the "Past Updates" button in the selected goal        The patient verbalized understanding of instructions, educational materials, and care plan provided today and agreed to receive a mailed copy of patient instructions, educational materials, and care plan.   Telephone follow up appointment with pharmacy team member scheduled for: 09/07/2020  William Williamson, [facility]

## 2020-03-09 NOTE — Chronic Care Management (AMB) (Signed)
Chronic Care Management Pharmacy  Name: DASHTON CZERWINSKI  MRN: [id number] DOB: 1932-04-10  Chief Complaint/ HPI  William Williamson,  86 y.o. , male presents for their Follow-Up CCM visit with the clinical pharmacist via telephone due to COVID-19 Pandemic.  PCP : [name], DO  Their chronic conditions include: HTN, HLD, Hypothyroidism, Parkinson Disease, Vitamin D Deficiency, BPH, Lower Extremity Edema, Tinea Corporis, Glaucoma, Constipation  Office Visits: None since last CCM visit on 12/08/19.   Consult Visit: None since last CCM visit on 12/08/19.   Medications: Outpatient Encounter Medications as of 03/09/2020  Medication Sig Note  . carbidopa-levodopa (SINEMET CR) 50-200 MG tablet Take 1 tablet by mouth at bedtime.   . carbidopa-levodopa (SINEMET IR) 25-100 MG tablet TAKE 2 TABLETS BY MOUTH THREE TIMES DAILY   . Cholecalciferol 2000 UNITS CAPS Take 1 capsule by mouth daily.    Marland Kitchen docusate sodium (COLACE) 100 MG capsule Take 200 mg by mouth daily as needed for mild constipation.   . dorzolamide-timolol (COSOPT) 22.3-6.8 MG/ML ophthalmic solution Place 1 drop into both eyes 2 (two) times daily.  06/02/2019: Uses once daily due to difficulty with tremor   . furosemide (LASIX) 20 MG tablet Take 1 tablet by mouth once daily   . latanoprost (XALATAN) 0.005 % ophthalmic solution Place 1 drop into both eyes at bedtime.    Marland Kitchen levothyroxine (EUTHYROX) 100 MCG tablet Take 1 tablet (100 mcg total) by mouth daily before breakfast.   . naftifine (NAFTIN) 1 % cream Apply topically daily. 06/02/2019: Did not pick up due to cost  . NONFORMULARY OR COMPOUNDED ITEM Walker  #1   Dx parkinson disease   . nystatin (NYSTATIN) powder Apply topically 4 (four) times daily.   . polyethylene glycol (MIRALAX / GLYCOLAX) packet Take 17 g by mouth daily as needed.  06/02/2019: Uses glycerin suppository when he is constipated around once per week. Felt miralax was too much  . simvastatin (ZOCOR) 20 MG  tablet Take 1 tablet (20 mg total) by mouth every evening.    No facility-administered encounter medications on file as of 03/09/2020.   SDOH Screenings   Alcohol Screen:   . Last Alcohol Screening Score (AUDIT): Not on file  Depression (PHQ2-9): Low Risk   . PHQ-2 Score: 1  Financial Resource Strain:   . Difficulty of Paying Living Expenses: Not on file  Food Insecurity:   . Worried About Charity fundraiser in the Last Year: Not on file  . Ran Out of Food in the Last Year: Not on file  Housing:   . Last Housing Risk Score: Not on file  Physical Activity:   . Days of Exercise per Week: Not on file  . Minutes of Exercise per Session: Not on file  Social Connections:   . Frequency of Communication with Friends and Family: Not on file  . Frequency of Social Gatherings with Friends and Family: Not on file  . Attends Religious Services: Not on file  . Active Member of Clubs or Organizations: Not on file  . Attends Archivist Meetings: Not on file  . Marital Status: Not on file  Stress:   . Feeling of Stress : Not on file  Tobacco Use: Medium Risk  . Smoking Tobacco Use: Former Smoker  . Smokeless Tobacco Use: Never Used  Transportation Needs:   . Film/video editor (Medical): Not on file  . Lack of Transportation (Non-Medical): Not on file     Current  Diagnosis/Assessment:  Goals Addressed            This Visit's Progress   . Chronic Care Management Pharmacy Care Plan       CARE PLAN ENTRY (see longitudinal plan of care for additional care plan information)  Current Barriers:  . Chronic Disease Management support, education, and care coordination needs related to HTN, HLD, Hypothyroidism, Parkinson Disease, Vitamin D Deficiency, BPH, Lower Extremity Edema, Tinea Corporis, Glaucoma, Constipation   Hypertension BP Readings from Last 3 Encounters:  10/14/19 135/87  03/03/19 (!) 160/100  12/30/18 (!) 170/90   . Pharmacist Clinical Goal(s): o Over the  next 90 days, patient will work with PharmD and providers to maintain BP goal <140/90 . Current regimen:  o Diet and exercise management   . Interventions: o Requested patient to check his blood pressure 2-3 times per week . Patient self care activities - Over the next 90 days, patient will: o Check BP 2-3 times per week, document, and provide at future appointments o Ensure daily salt intake < 2300 mg/William Williamson  Hyperlipidemia Lab Results  Component Value Date/Time   LDLCALC 90 03/03/2019 01:58 PM   . Pharmacist Clinical Goal(s): o Over the next 90 days, patient will work with PharmD and providers to maintain LDL goal < 100 . Current regimen:  o Simvastatin 20mg  daily . Patient self care activities - Over the next 90 days, patient will: o Maintain cholesterol medication regimen.   Hypothyroidism . Pharmacist Clinical Goal(s) o Over the next 90 days, patient will work with PharmD and providers to reduce symptoms associated with hypothyroidism . Current regimen:  o Levothyroxine 186mcg daily . Interventions: o Discussed optimal administration of levothyroxine noting abnormal thyroid lab o Encouraged patient to follow up with Dr. Etter Sjogren (completed) . Patient self care activities - Over the next 90 days, patient will: o Take levothyroxine optimally  Health Maintenance  . Pharmacist Clinical Goal(s) o Over the next 90 days, patient will work with PharmD and providers to complete health maintenance screenings/vaccinations . Interventions: o Recommended patient receive flu vaccine o Recommended patient receive covid vaccine booster . Patient self care activities - Over the next 90 days, patient will: o Receive flu vaccine o Receive covid vaccine booster    Medication management . Pharmacist Clinical Goal(s): o Over the next 90 days, patient will work with PharmD and providers to maintain optimal medication adherence . Current pharmacy: [facility] . Interventions o Comprehensive medication review performed. o Continue current medication management strategy . Patient self care activities - Over the next 90 days, patient will: o Focus on medication adherence by filling and taking medications appropriately  o Take medications as prescribed o Report any questions or concerns to PharmD and/or provider(s)  Please see past updates related to this goal by clicking on the "Past Updates" button in the selected goal       Social Hx: Former Therapist, art x 4 years. 3 sons ([place], [place] (Middle son;3 kids; retiring from [employer]), 1 Orthoptist (oldest 3 kids), 1 (youngest in [place], but planning to move in with him)  Uses pill box  Hypertension   BP goal <140/90  Office blood pressures are  BP Readings from Last 3 Encounters:  10/14/19 135/87  03/03/19 (!) 160/100  12/30/18 (!) 170/90    Patient has failed these meds in the past: lisinopril, ramipril (hypotension with course of midodrine at one point)  Patient is currently uncontrolled on the following medications:   None  Patient  checks BP at home several times per month  Patient home BP readings are ranging: 130/70  We discussed: Proper blood pressure measurement technique  Update 12/08/19 Reports 130/60.  Denies chest pain, headaches, or dizziness (outside of Parkinsons). States he fell about a month ago. Did not hit his head. Sprains his wrists due to using them to catch his fall. States that it took an hour and a half to get up. States he has an apple watch that will allow him to alert someone if he falls.   Update 03/09/20 130/60 on average per pt  Plan -Check blood pressure 2-3 times per week -Continue control with diet and exercise     Hypothyroidism   TSH  Date Value Ref Range Status  01/18/2020 2.80 0.40 - 4.50 mIU/L Final     No components found for: T4  Patient has failed these meds in past: None noted Patient is currently controlled  on the following  medications:   Levothyroxine 126mcg daily  We discussed:  Optimal administration of levothyroxine. Pt reports adherence and states he always takes his levothyroxine with his carbidopa/levadopa. Unsure why acute TSH spike, but recommend taking levothyroxine optimally to reduce this as a variable   Update 12/08/19 Needs to get appt with Dr. Etter Sjogren  Update 03/09/20 TSH rechecked and now WNL. Administering levothyroxine before other meds? No, still taking with levodopa  Plan -Continue current medications  Parkinson Disease    Patient has failed these meds in past: None noted Patient is currently stable on the following medications:   Carbidopa-levodopa IR 25-100mg  2 tabs 7am/11am/3pm and 1 tab at 7pm  Carbidopa-levodopa CR 50-200 1 tab at bedtime  Patient is concerned with decline in functioning.  Carb/Levo: Usually takes 6 tabs per Brileigh Sevcik. Only takes the #7 tabs every other Joseth Weigel. "The Shermika Balthaser seems to be over before you know what's happened". Does not feel like the medication is making a difference.Has taken ~5 years starting at #2 tabs daily  Patient admits he needs help managing medication regimen  Update 12/08/19 Admits he never gets all of his doses in. States he takes #2-3 of his #5 prescribed doses daily. Does the 7am dose, 7pm dose, and bedtime dose. He doesn't feel he would see a difference even if he took all prescribed doses of medication. Wonders if he does take all the tablets as prescribed if this would make him worse. Eats with one hand. Still has shaking, but feels it is manageable. Recommended that patient set an alarm for his medication dosing and taking his doses consistently for at least a week to see if this  He would like to continue his medication management as he currently is. "I'm happy with the current treatment"  Update 03/09/20 Tolerating regimen  Plan -Continue current medications   Vaccines   Reviewed and discussed patient's vaccination history.     Immunization History  Administered Date(s) Administered  . Fluad Quad(high Dose 65+) 12/30/2018  . Influenza Split 01/09/2011, 01/15/2012  . Influenza Whole 03/11/2007, 01/09/2008, 01/26/2009, 01/23/2010  . Influenza, High Dose Seasonal PF 01/22/2013, 12/23/2014, 01/17/2016  . Influenza,inj,Quad PF,6+ Mos 12/21/2013  . Influenza-Unspecified 12/31/2017  . PFIZER SARS-COV-2 Vaccination 04/16/2019, 05/17/2019  . Pneumococcal Conjugate-13 06/21/2014  . Pneumococcal Polysaccharide-23 06/19/2013  . Td 07/07/2008  . Zoster 06/21/2014  . Zoster Recombinat (Shingrix) 12/26/2017, 04/09/2018   Patient inquires of whether he should get the covid vaccine booster.  Recommended that patient get booster considering his age and comorbidities.  Plan -Recommended patient receive Flu  vaccine in pharmacy.  -Recommended patient receive Covid vaccine booster.   De Blanch, PharmD, BCACP Clinical Pharmacist [facility] [phone]

## 2020-03-21 ENCOUNTER — Telehealth (INDEPENDENT_AMBULATORY_CARE_PROVIDER_SITE_OTHER): Payer: PPO | Admitting: Medical

## 2020-03-21 ENCOUNTER — Other Ambulatory Visit: Payer: Self-pay

## 2020-03-21 DIAGNOSIS — R519 Headache, unspecified: Secondary | ICD-10-CM

## 2020-03-21 NOTE — Progress Notes (Signed)
Subjective:    Patient ID: William Williamson, male    DOB: 1932/07/15, 86 y.o.   MRN: [id number]  HPI  Virtual Visit via Telephone Note  I connected with William Williamson on 03/21/20 at  9:40 AM EST by telephone and verified that I am speaking with the correct person using two identifiers.  Pt did not check his bp and pulse.  In the past when he checks his bp at home. Bp is 100/60  Location: Patient: home Provider:office   I discussed the limitations, risks, security and privacy concerns of performing an evaluation and management service by telephone and the availability of in person appointments. I also discussed with the patient that there may be a patient responsible charge related to this service. The patient expressed understanding and agreed to proceed.   History of Present Illness: Pt states he woke up this morning and had very faint low level ha.  Then it went away with no tx.  Pt thought maybe covid. He has no fever, no chills, no sweats, no bodyaches, no loss of smell or taste.   Pt states he missed chair today and lane seated on buttock. No injuyr.   Pt has gotten covid vaccines and booster.       Observations/Objective: General-pleasant, alert oriented with normal speech.  Assessment and Plan: Transient very low level headache that lasted at best 30 minutes this morning.  No headache presently.  No gross motor or sensory function deficits reported.  No associated symptoms that sound Covid like.  The patient does report initially this was his concern.  Not reporting any close contact with persons who have Covid.  Patient has had initial to shortness of Covid vaccine and has had them previously.  No blood pressure pulse taken today.  The patient reports that his blood pressure does not run high.  When he checks it at home and relaxed blood pressure usually 100/60.  Advised patient to watch himself closely.  If he can get blood pressure and pulse reading  please call us back with those readings.  If he has any recurrent headache notify us.  If any Covid type signs symptoms recommend that he get rapid and PCR test.  Local pharmacy.  Patient expressed understanding.  Follow-up as regular scheduled with PCP or as needed.  Mackie Pai, PA-C   Time spent with patient today was 20  minutes which consisted of chart review, discussing potential differential diagnosis of his mild transient headache that has already resolved and documentation.  Discussed plan going forward depending on types of symptoms or return.  Follow Up Instructions:    I discussed the assessment and treatment plan with the patient. The patient was provided an opportunity to ask questions and all were answered. The patient agreed with the plan and demonstrated an understanding of the instructions.   The patient was advised to call back or seek an in-person evaluation if the symptoms worsen or if the condition fails to improve as anticipated.  Time spent with patient today was 15  minutes which consisted of chart revdiew, discussing diagnosis, work up treatment and documentation.   Mackie Pai, PA-C   Review of Systems  Constitutional: Negative for chills, fatigue and fever.  HENT: Negative for congestion, ear pain, hearing loss and postnasal drip.   Respiratory: Negative for cough, chest tightness, shortness of breath and wheezing.   Cardiovascular: Negative for chest pain and palpitations.  Gastrointestinal: Negative for abdominal pain, anal bleeding and blood in  stool.  Musculoskeletal: Negative for back pain, joint swelling and myalgias.  Skin: Negative for pallor, rash and wound.  Neurological: Negative for dizziness, syncope, weakness and headaches.  Hematological: Negative for adenopathy. Does not bruise/bleed easily.  Psychiatric/Behavioral: Negative for behavioral problems and decreased concentration.       Objective:   Physical Exam         Assessment & Plan:

## 2020-03-21 NOTE — Patient Instructions (Signed)
Transient very low level headache that lasted at best 30 minutes this morning.  No headache presently.  No gross motor or sensory function deficits reported.  No associated symptoms that sound Covid like.  The patient does report initially this was his concern.  Not reporting any close contact with persons who have Covid.  Patient has had initial to shortness of Covid vaccine and has had them previously.  No blood pressure pulse taken today.  The patient reports that his blood pressure does not run high.  When he checks it at home and relaxed blood pressure usually 100/60.  Advised patient to watch himself closely.  If he can get blood pressure and pulse reading please call us back with those readings.  If he has any recurrent headache notify us.  If any Covid type signs symptoms recommend that he get rapid and PCR test.  Local pharmacy.  Patient expressed understanding.  Follow-up as regular scheduled with PCP or as needed.

## 2020-04-08 ENCOUNTER — Ambulatory Visit: Payer: PPO | Admitting: Neurology

## 2020-04-11 ENCOUNTER — Ambulatory Visit (INDEPENDENT_AMBULATORY_CARE_PROVIDER_SITE_OTHER): Payer: PPO | Admitting: Family Medicine

## 2020-04-11 ENCOUNTER — Ambulatory Visit (HOSPITAL_BASED_OUTPATIENT_CLINIC_OR_DEPARTMENT_OTHER)
Admission: RE | Admit: 2020-04-11 | Discharge: 2020-04-11 | Disposition: A | Discharge status: Home / Self Care | Payer: PPO | Source: Ambulatory Visit | Attending: Family Medicine | Admitting: Family Medicine

## 2020-04-11 ENCOUNTER — Other Ambulatory Visit: Payer: Self-pay

## 2020-04-11 VITALS — BP 150/98 | Temp 97.5°F | Ht 73.0 in | Wt 181.0 lb

## 2020-04-11 DIAGNOSIS — I10 Essential (primary) hypertension: Secondary | ICD-10-CM | POA: Diagnosis not present

## 2020-04-11 DIAGNOSIS — M25532 Pain in left wrist: Secondary | ICD-10-CM

## 2020-04-11 DIAGNOSIS — R443 Hallucinations, unspecified: Secondary | ICD-10-CM

## 2020-04-11 DIAGNOSIS — E785 Hyperlipidemia, unspecified: Secondary | ICD-10-CM

## 2020-04-11 DIAGNOSIS — G4700 Insomnia, unspecified: Secondary | ICD-10-CM

## 2020-04-11 DIAGNOSIS — R600 Localized edema: Secondary | ICD-10-CM | POA: Diagnosis not present

## 2020-04-11 DIAGNOSIS — Z0000 Encounter for general adult medical examination without abnormal findings: Secondary | ICD-10-CM

## 2020-04-11 DIAGNOSIS — R32 Unspecified urinary incontinence: Secondary | ICD-10-CM | POA: Diagnosis not present

## 2020-04-11 DIAGNOSIS — M25522 Pain in left elbow: Secondary | ICD-10-CM

## 2020-04-11 DIAGNOSIS — G20 Parkinson's disease: Secondary | ICD-10-CM

## 2020-04-11 LAB — POC URINALSYSI DIPSTICK (AUTOMATED)
Blood, UA: NEGATIVE
Glucose, UA: NEGATIVE
Ketones, UA: 5
Leukocytes, UA: NEGATIVE
Nitrite, UA: NEGATIVE
Protein, UA: POSITIVE — AB
Spec Grav, UA: 1.02 (ref 1.010–1.025)
Urobilinogen, UA: 0.2 E.U./dL
pH, UA: 6 (ref 5.0–8.0)

## 2020-04-11 MED ORDER — MIRABEGRON ER 50 MG PO TB24: 50.0000 mg | ORAL_TABLET | Freq: Every day | ORAL | 2 refills | Status: DC

## 2020-04-11 MED ORDER — LISINOPRIL 10 MG PO TABS: 10.0000 mg | ORAL_TABLET | Freq: Every day | ORAL | 3 refills | Status: DC

## 2020-04-11 MED ORDER — FUROSEMIDE 20 MG PO TABS: 20.0000 mg | ORAL_TABLET | Freq: Every day | ORAL | 3 refills | Status: DC

## 2020-04-11 NOTE — Progress Notes (Signed)
Patient ID: William Williamson, male    DOB: [birth year]  Age: 87 y.o. MRN: [id number]     Subjective:  Subjective  HPI William Williamson presents for cpe   His son is with him    He c/o being confused at night and hallucinating ---  But only at night  He is also falling a lot more because he is not using his walker.  In one of his falls he injured his L wrist and elbow He also c/o frequent urination esp at night ---- he has not seen urology in a long time  Review of Systems  Constitutional: Positive for appetite change.  HENT: Negative for congestion, ear pain, hearing loss, nosebleeds, postnasal drip, rhinorrhea, sinus pressure, sneezing and tinnitus.   Eyes: Negative for photophobia, discharge, itching and visual disturbance.  Respiratory: Negative.   Cardiovascular: Negative.   Gastrointestinal: Negative for abdominal distention, abdominal pain, anal bleeding, blood in stool and constipation.  Endocrine: Negative.   Genitourinary: Negative.   Musculoskeletal: Positive for gait problem.  Skin: Negative.   Allergic/Immunologic: Negative.   Neurological: Positive for tremors and weakness. Negative for dizziness, light-headedness, numbness and headaches.  Psychiatric/Behavioral: Positive for agitation, decreased concentration, hallucinations and sleep disturbance. Negative for confusion, dysphoric mood, self-injury and suicidal ideas. The patient is not nervous/anxious.     History Past Medical History:  Diagnosis Date   Anemia 1952   post Oromycin for [place]mia   Anisocoria    post op   Arthritis    Autonomic dysfunction    GERD (gastroesophageal reflux disease)    Glaucoma    Dr Bing Plume   Hearing loss in right ear    Hypercholesteremia    Hyperthyroidism    s/p RAI   LVH (left ventricular hypertrophy)    Orthostatic hypotension    Parkinson's disease ([place])     He has a past surgical history that includes Septoplasty (1970); Cardiac catheterization (07/2006);  Vasectomy; RAI ablation (02/2008); Cataract extraction, bilateral; and Colonoscopy.   His family history includes Diabetes in his brother; Heart attack (age of onset: 53) in his maternal grandfather; Hypertension in his brother and sister; Osteoporosis in his mother; Pancreatic cancer in his brother; Prostate cancer in his brother; Stomach cancer in his maternal grandmother.He reports that he quit smoking about 52 years ago. He has never used smokeless tobacco. He reports current alcohol use of about 6.0 standard drinks of alcohol per week. He reports that he does not use drugs.  Current Outpatient Medications on File Prior to Visit  Medication Sig Dispense Refill   carbidopa-levodopa (SINEMET CR) 50-200 MG tablet Take 1 tablet by mouth at bedtime. 90 tablet 1   carbidopa-levodopa (SINEMET IR) 25-100 MG tablet TAKE 2 TABLETS BY MOUTH THREE TIMES DAILY 540 tablet 0   Cholecalciferol 2000 UNITS CAPS Take 1 capsule by mouth daily.     levothyroxine (EUTHYROX) 100 MCG tablet Take 1 tablet (100 mcg total) by mouth daily before breakfast. 90 tablet 1   naftifine (NAFTIN) 1 % cream Apply topically daily. 30 g 0   NONFORMULARY OR COMPOUNDED ITEM Walker  #1   Dx parkinson disease 1 each 0   nystatin (NYSTATIN) powder Apply topically 4 (four) times daily. 15 g 0   polyethylene glycol (MIRALAX / GLYCOLAX) packet Take 17 g by mouth daily as needed.      simvastatin (ZOCOR) 20 MG tablet Take 1 tablet (20 mg total) by mouth every evening. 30 tablet 0   No current facility-administered medications  on file prior to visit.     Objective:  Objective  Physical Exam Vitals and nursing note reviewed.  Constitutional:      General: He is sleeping. Vital signs are normal.     Appearance: He is well-developed and well-nourished.  HENT:     Head: Normocephalic and atraumatic.     Mouth/Throat:     Mouth: Oropharynx is clear and moist.  Eyes:     Extraocular Movements: EOM normal.     Pupils: Pupils  are equal, round, and reactive to light.  Neck:     Thyroid: No thyromegaly.  Cardiovascular:     Rate and Rhythm: Normal rate and regular rhythm.     Heart sounds: No murmur heard.   Pulmonary:     Effort: Pulmonary effort is normal. No respiratory distress.     Breath sounds: Normal breath sounds. No wheezing or rales.  Chest:     Chest wall: No tenderness.  Musculoskeletal:        General: Tenderness present. No edema.     Left elbow: Swelling present. Normal range of motion. Tenderness present.     Left wrist: Tenderness present. No swelling, bony tenderness or snuff box tenderness. Normal range of motion.     Cervical back: Normal range of motion and neck supple.  Skin:    General: Skin is warm and dry.  Neurological:     Mental Status: He is oriented to person, place, and time.  Psychiatric:        Mood and Affect: Mood and affect normal.        Behavior: Behavior normal.        Thought Content: Thought content normal.        Judgment: Judgment normal.    BP (!) 150/98 (BP Location: Left Arm, Patient Position: Sitting, Cuff Size: Large)    Temp (!) 97.5 F (36.4 C) (Oral)    Ht 6\' 1"  (1.854 m)    Wt 181 lb (82.1 kg)    BMI 23.88 kg/m  Wt Readings from Last 3 Encounters:  04/11/20 181 lb (82.1 kg)  10/14/19 174 lb (78.9 kg)  03/03/19 178 lb 6.4 oz (80.9 kg)     Lab Results  Component Value Date   WBC 7.1 04/12/2020   HGB 13.7 04/12/2020   HCT 41.0 04/12/2020   PLT 196.0 04/12/2020   GLUCOSE 80 04/12/2020   CHOL 131 04/12/2020   TRIG 63.0 04/12/2020   HDL 46.40 04/12/2020   LDLCALC 72 04/12/2020   ALT 11 04/12/2020   AST 17 04/12/2020   NA 138 04/12/2020   K 4.2 04/12/2020   CL 103 04/12/2020   CREATININE 1.21 04/12/2020   BUN 23 04/12/2020   CO2 30 04/12/2020   TSH 2.80 01/18/2020   PSA 4.99 (H) 04/12/2020   INR 0.9 RATIO 07/26/2006   HGBA1C 5.9 10/09/2006    DG OP Swallowing Func-Medicare/Speech Path  Result Date: 03/05/2018 Objective Swallowing  Evaluation: Type of Study: MBS-Modified Barium Swallow Study  Patient Details Name: William Williamson MRN: [id number] Date of Birth: 1933/01/04 Today's Date: 03/05/2018 Time: SLP Start Time (ACUTE ONLY): 1328 -SLP Stop Time (ACUTE ONLY): 1410 SLP Time Calculation (min) (ACUTE ONLY): 42 min Past Medical History: Past Medical History: Diagnosis Date  Anemia 1952  post Oromycin for [place]  Anisocoria   post op  Arthritis   Autonomic dysfunction   GERD (gastroesophageal reflux disease)   Glaucoma   Dr Bing Plume  Hearing loss in right ear  Hypercholesteremia   Hyperthyroidism   s/p RAI  LVH (left ventricular hypertrophy)   Orthostatic hypotension   Parkinson's disease ([place])  Past Surgical History: Past Surgical History: Procedure Laterality Date  CARDIAC CATHETERIZATION  07/2006  Dr.  Eustace Quail, negative  CATARACT EXTRACTION, BILATERAL    OS retinal surgery for post op floaters  COLONOSCOPY    negative X 3  RAI ablation  02/2008  hyperthyroidism  SEPTOPLASTY  1970  VASECTOMY   HPI: 87 yo gentleman referred for MBS by Dr Etter Sjogren due to pt report of coughing with intake - He reports most difficulties with solids requiring him to cough and at times expectorate solids mixed with secretions.  He states this mostly occurs immediately after eating.  Reports difficulties with nuts, oranges, etc.  Pt denies weight loss, pneumonias nor requiring heimlich manuever.  He has not had prior swallow studies.  H/o GERD but pt states this "went away".  Pt also reports he frequently drools.   Subjective: pt awake sitting upright in chair, notable excessive upper body movement due to his Parkinsons Assessment / Plan / Recommendation CHL IP CLINICAL IMPRESSIONS 03/05/2018 Clinical Impression Patient presents with mild oral and moderate pharyngeal dysphagia with sensorimotor deficits.  Impaired pharyngeal contraction, tongue base retraction and laryngeal elevation results in residuals across all consistencies.  Various  postures including head turn left *to pt's purported weak side* with and without chin tuck with thin and nectar did not prevent accumulation of barium and resulted in trace laryngeal penetration of thin as barium spilled from pyriform into open larynx.  He did NOT aspirate and trace penetrates cleared with further swallow.  Pt does not always sense residuals and pharyngeal residuals were not worse with solids compared to liquids.   SLP taxed pt in effort to try to get him to aspirate with sequential boluses of thin (7) but he did not.  Dry swallows *reflexive and cued* help to decrease residuals.   Cued "hock" helpful for pt to expel vallecular residuals and advised pt to use this strategy during meals to assure vallecular clearance.  Pt able to transit and swallow barium tablet with thin without difficulty.  Upon esophageal sweep, esophagus appeared clear but this test is not intended to evaluate esophageal function.  Advised pt to start meals with liquids *prefer water*, conduct multiple dry swallows per bolus, masticate thoroughly, conduct effortful swallow, follow solids with liquids, intermittent "hock".  Also provided him with lingual press and effortful swallow exercise to facilitate pharyngeal contraction.  Advised he use caution with foods that cause difficulties like oranges, nuts - and he may need to avoid these due to increased pulmonary risk if aspirating solids.  Please consider referral for OP for pt to undergo Respiratory Muscle Strength Training *RMST* to facilitate laryngeal closure, voice and cough strength for maximal swallow/voice benefits.  Using teach back and written instructions pt educated.  Thanks for this referral.   SLP Visit Diagnosis Dysphagia, oropharyngeal phase (R13.12);Dysphagia, pharyngoesophageal phase (R13.14) Attention and concentration deficit following -- Frontal lobe and executive function deficit following -- Impact on safety and function Moderate aspiration risk   No  flowsheet data found.  No flowsheet data found. CHL IP DIET RECOMMENDATION 03/05/2018 SLP Diet Recommendations Regular solids;Thin liquid Liquid Administration via Cup;Straw Medication Administration Other ( as tolerated, with pudding if problematic Compensations Slow rate;Small sips/bites;Multiple dry swallows after each bite/sip;Follow solids with liquid;Effortful swallow, intermittent "hock" Postural Changes Remain semi-upright after after feeds/meals (Comment);Seated upright at 90 degrees   CHL  IP OTHER RECOMMENDATIONS 03/05/2018 Recommended Consults OP therapy for feeding Oral Care Recommendations Oral care BID Other Recommendations --   CHL IP FOLLOW UP RECOMMENDATIONS 03/05/2018 Follow up Recommendations Outpatient SLP   No flowsheet data found.     CHL IP ORAL PHASE 03/05/2018 Oral Phase Impaired Oral - Pudding Teaspoon -- Oral - Pudding Cup -- Oral - Honey Teaspoon -- Oral - Honey Cup -- Oral - Nectar Teaspoon -- Oral - Nectar Cup Reduced posterior propulsion Oral - Nectar Straw Reduced posterior propulsion Oral - Thin Teaspoon Lingual pumping Oral - Thin Cup Reduced posterior propulsion Oral - Thin Straw Reduced posterior propulsion Oral - Puree WFL;Lingual pumping Oral - Mech Soft -- Oral - Regular WFL Oral - Multi-Consistency -- Oral - Pill WFL Oral Phase - Comment lingual pumping with thin via tsp and puree, swallow appears effortful with increased visocity  CHL IP PHARYNGEAL PHASE 03/05/2018 Pharyngeal Phase Impaired Pharyngeal- Pudding Teaspoon -- Pharyngeal -- Pharyngeal- Pudding Cup -- Pharyngeal -- Pharyngeal- Honey Teaspoon -- Pharyngeal -- Pharyngeal- Honey Cup -- Pharyngeal -- Pharyngeal- Nectar Teaspoon -- Pharyngeal -- Pharyngeal- Nectar Cup Reduced pharyngeal peristalsis;Reduced epiglottic inversion;Reduced laryngeal elevation;Reduced airway/laryngeal closure;Reduced tongue base retraction;Pharyngeal residue - valleculae;Pharyngeal residue - pyriform Pharyngeal -- Pharyngeal- Nectar Straw Reduced  pharyngeal peristalsis;Reduced epiglottic inversion;Reduced laryngeal elevation;Reduced airway/laryngeal closure;Reduced tongue base retraction;Pharyngeal residue - valleculae;Pharyngeal residue - pyriform Pharyngeal -- Pharyngeal- Thin Teaspoon Reduced pharyngeal peristalsis;Reduced epiglottic inversion;Reduced laryngeal elevation;Reduced airway/laryngeal closure;Reduced tongue base retraction;Pharyngeal residue - valleculae;Pharyngeal residue - pyriform Pharyngeal -- Pharyngeal- Thin Cup Reduced pharyngeal peristalsis;Reduced epiglottic inversion;Reduced laryngeal elevation;Reduced airway/laryngeal closure;Reduced tongue base retraction;Pharyngeal residue - valleculae;Pharyngeal residue - pyriform Pharyngeal -- Pharyngeal- Thin Straw Reduced pharyngeal peristalsis;Reduced epiglottic inversion;Reduced laryngeal elevation;Reduced airway/laryngeal closure;Reduced tongue base retraction;Penetration/Aspiration during swallow;Pharyngeal residue - valleculae;Pharyngeal residue - pyriform Pharyngeal Material enters airway, remains ABOVE vocal cords then ejected out Pharyngeal- Puree Reduced pharyngeal peristalsis;Reduced epiglottic inversion;Pharyngeal residue - valleculae;Pharyngeal residue - pyriform Pharyngeal -- Pharyngeal- Mechanical Soft -- Pharyngeal -- Pharyngeal- Regular Reduced pharyngeal peristalsis;Reduced epiglottic inversion;Pharyngeal residue - valleculae;Pharyngeal residue - pyriform Pharyngeal -- Pharyngeal- Multi-consistency -- Pharyngeal -- Pharyngeal- Pill Reduced pharyngeal peristalsis;Reduced epiglottic inversion;Reduced laryngeal elevation;Reduced airway/laryngeal closure;Reduced tongue base retraction;Pharyngeal residue - valleculae;Pharyngeal residue - pyriform Pharyngeal -- Pharyngeal Comment various postures, head turn left *to pt's reported weak side where he "drag his leg" with and without chin tuck not helpful to prevent residual accumulation and actually allowed TRACE penetration of thin due  to spillage from pyriform sinus into airway  CHL IP CERVICAL ESOPHAGEAL PHASE 03/05/2018 Cervical Esophageal Phase Impaired Pudding Teaspoon -- Pudding Cup -- Honey Teaspoon -- Honey Cup -- Nectar Teaspoon -- Nectar Cup -- Nectar Straw -- Thin Teaspoon -- Thin Cup -- Thin Straw -- Puree -- Mechanical Soft -- Regular -- Multi-consistency -- Pill -- Cervical Esophageal Comment decreased clearance into esophagus resulting in pyriform sinus residuals Chales AbrahamsKimball, Tamara Ann 03/05/2018, 4:13 PM      Donavan Burnetamara Kimball, MS [facility] SLP Acute Rehab Services Pager [phone] Office [phone]       CLINICAL DATA:  Dysphagia. EXAM: MODIFIED BARIUM SWALLOW TECHNIQUE: Different consistencies of barium were administered orally to the patient by the Speech Pathologist. Imaging of the pharynx was performed in the lateral projection. FLUOROSCOPY TIME:  Fluoroscopy Time:  1 minutes 42 seconds Radiation Exposure Index (if provided by the fluoroscopic device): 8.2 MGy Number of Acquired Spot Images: 0 COMPARISON:  None. FINDINGS: Thin liquid- within normal limits Nectar thick liquid- within normal limits Honey- within normal limits Pure- within normal limits Cracker-within normal  limits Pure with cracker- within normal limits Barium tablet -  within normal limits Pooling of contrast within the hypopharynx was noted with all consistencies. IMPRESSION: Pooling of contrast within the hypopharynx, otherwise no evidence of laryngeal penetration or tracheal aspiration. Please refer to the Speech Pathologists report for complete details and recommendations. Electronically Signed   By: Fidela Salisbury M.D.   On: 03/05/2018 14:30     Assessment & Plan:  Plan  I have discontinued William Williamson's dorzolamide-timolol, latanoprost, and docusate sodium. I have also changed his furosemide. Additionally, I am having him start on mirabegron ER, lisinopril, and mirtazapine. Lastly, I am having him maintain his Cholecalciferol, polyethylene  glycol, NONFORMULARY OR COMPOUNDED ITEM, nystatin, naftifine, carbidopa-levodopa, carbidopa-levodopa, simvastatin, and levothyroxine.  Meds ordered this encounter  Medications   mirabegron ER (MYRBETRIQ) 50 MG TB24 tablet    Sig: Take 1 tablet (50 mg total) by mouth daily.    Dispense:  30 tablet    Refill:  2   furosemide (LASIX) 20 MG tablet    Sig: Take 1 tablet (20 mg total) by mouth daily.    Dispense:  90 tablet    Refill:  3   lisinopril (ZESTRIL) 10 MG tablet    Sig: Take 1 tablet (10 mg total) by mouth daily.    Dispense:  90 tablet    Refill:  3   mirtazapine (REMERON) 7.5 MG tablet    Sig: Take 1 tablet (7.5 mg total) by mouth at bedtime.    Dispense:  30 tablet    Refill:  2    Problem List Items Addressed This Visit      Unprioritized   Hallucinations   Hyperlipidemia   Relevant Medications   furosemide (LASIX) 20 MG tablet   lisinopril (ZESTRIL) 10 MG tablet   Other Relevant Orders   Comprehensive metabolic panel (Completed)   Lipid panel (Completed)   Insomnia   Relevant Medications   mirtazapine (REMERON) 7.5 MG tablet   Left elbow pain   Relevant Orders   DG Elbow Complete Left (Completed)   Left wrist pain   Relevant Orders   DG Wrist Complete Left (Completed)   Lower extremity edema   Relevant Medications   furosemide (LASIX) 20 MG tablet   Parkinson disease ([place])    F/u neurology      Preventative health care - Primary    ghm utd Check labs       Primary hypertension    Poorly controlled will alter medications, encouraged DASH diet, minimize caffeine and obtain adequate sleep. Report concerning symptoms and follow up as directed and as needed      Relevant Medications   furosemide (LASIX) 20 MG tablet   lisinopril (ZESTRIL) 10 MG tablet   Urinary incontinence    myrbetriiq daily Consider urology f/u      Relevant Medications   mirabegron ER (MYRBETRIQ) 50 MG TB24 tablet   Other Relevant Orders   POCT Urinalysis Dipstick  (Automated) (Completed)   CBC with Differential/Platelet (Completed)   PSA (Completed)      Follow-up: Return in about 3 weeks (around 05/02/2020), or if symptoms worsen or fail to improve, for hypertension.  [name], DO

## 2020-04-11 NOTE — Patient Instructions (Signed)
Preventive Care 65 Years and Older, Male Preventive care refers to lifestyle choices and visits with your health care provider that can promote health and wellness. This includes:  A yearly physical exam. This is also called an annual wellness visit.  Regular dental and eye exams.  Immunizations.  Screening for certain conditions.  Healthy lifestyle choices, such as: ? Eating a healthy diet. ? Getting regular exercise. ? Not using drugs or products that contain nicotine and tobacco. ? Limiting alcohol use. What can I expect for my preventive care visit? Physical exam Your health care provider will check your:  Height and weight. These may be used to calculate your BMI (body mass index). BMI is a measurement that tells if you are at a healthy weight.  Heart rate and blood pressure.  Body temperature.  Skin for abnormal spots. Counseling Your health care provider may ask you questions about your:  Past medical problems.  Family's medical history.  Alcohol, tobacco, and drug use.  Emotional well-being.  Home life and relationship well-being.  Sexual activity.  Diet, exercise, and sleep habits.  History of falls.  Memory and ability to understand (cognition).  Work and work environment.  Access to firearms. What immunizations do I need? Vaccines are usually given at various ages, according to a schedule. Your health care provider will recommend vaccines for you based on your age, medical history, and lifestyle or other factors, such as travel or where you work.   What tests do I need? Blood tests  Lipid and cholesterol levels. These may be checked every 5 years, or more often depending on your overall health.  Hepatitis C test.  Hepatitis B test. Screening  Lung cancer screening. You may have this screening every year starting at age 55 if you have a 30-pack-year history of smoking and currently smoke or have quit within the past 15 years.  Colorectal  cancer screening. ? All adults should have this screening starting at age 50 and continuing until age 75. ? Your health care provider may recommend screening at age 45 if you are at increased risk. ? You will have tests every 1-10 years, depending on your results and the type of screening test.  Prostate cancer screening. Recommendations will vary depending on your family history and other risks.  Genital exam to check for testicular cancer or hernias.  Diabetes screening. ? This is done by checking your blood sugar (glucose) after you have not eaten for a while (fasting). ? You may have this done every 1-3 years.  Abdominal aortic aneurysm (AAA) screening. You may need this if you are a current or former smoker.  STD (sexually transmitted disease) testing, if you are at risk. Follow these instructions at home: Eating and drinking  Eat a diet that includes fresh fruits and vegetables, whole grains, lean protein, and low-fat dairy products. Limit your intake of foods with high amounts of sugar, saturated fats, and salt.  Take vitamin and mineral supplements as recommended by your health care provider.  Do not drink alcohol if your health care provider tells you not to drink.  If you drink alcohol: ? Limit how much you have to 0-2 drinks a day. ? Be aware of how much alcohol is in your drink. In the [place] one drink equals one 12 oz bottle of beer (355 mL), one 5 oz glass of wine (148 mL), or one 1 oz glass of hard liquor (44 mL).   Lifestyle  Take daily care of your teeth   and gums. Brush your teeth every morning and night with fluoride toothpaste. Floss one time each day.  Stay active. Exercise for at least 30 minutes 5 or more days each week.  Do not use any products that contain nicotine or tobacco, such as cigarettes, e-cigarettes, and chewing tobacco. If you need help quitting, ask your health care provider.  Do not use drugs.  If you are sexually active, practice safe sex.  Use a condom or other form of protection to prevent STIs (sexually transmitted infections).  Talk with your health care provider about taking a low-dose aspirin or statin.  Find healthy ways to cope with stress, such as: ? Meditation, yoga, or listening to music. ? Journaling. ? Talking to a trusted person. ? Spending time with friends and family. Safety  Always wear your seat belt while driving or riding in a vehicle.  Do not drive: ? If you have been drinking alcohol. Do not ride with someone who has been drinking. ? When you are tired or distracted. ? While texting.  Wear a helmet and other protective equipment during sports activities.  If you have firearms in your house, make sure you follow all gun safety procedures. What's next?  Visit your health care provider once a year for an annual wellness visit.  Ask your health care provider how often you should have your eyes and teeth checked.  Stay up to date on all vaccines. This information is not intended to replace advice given to you by your health care provider. Make sure you discuss any questions you have with your health care provider. Document Revised: 12/16/2018 Document Reviewed: 03/13/2018 Elsevier Patient Education  2021 [employer]  

## 2020-04-12 ENCOUNTER — Telehealth: Payer: Self-pay

## 2020-04-12 ENCOUNTER — Encounter: Payer: Self-pay | Admitting: Family Medicine

## 2020-04-12 ENCOUNTER — Other Ambulatory Visit (INDEPENDENT_AMBULATORY_CARE_PROVIDER_SITE_OTHER): Payer: PPO

## 2020-04-12 DIAGNOSIS — G4700 Insomnia, unspecified: Secondary | ICD-10-CM | POA: Insufficient documentation

## 2020-04-12 DIAGNOSIS — R443 Hallucinations, unspecified: Secondary | ICD-10-CM | POA: Insufficient documentation

## 2020-04-12 DIAGNOSIS — R32 Unspecified urinary incontinence: Secondary | ICD-10-CM

## 2020-04-12 DIAGNOSIS — M25532 Pain in left wrist: Secondary | ICD-10-CM | POA: Insufficient documentation

## 2020-04-12 DIAGNOSIS — E785 Hyperlipidemia, unspecified: Secondary | ICD-10-CM | POA: Diagnosis not present

## 2020-04-12 DIAGNOSIS — M25522 Pain in left elbow: Secondary | ICD-10-CM | POA: Insufficient documentation

## 2020-04-12 LAB — COMPREHENSIVE METABOLIC PANEL
ALT: 11 U/L (ref 0–53)
AST: 17 U/L (ref 0–37)
Albumin: 4.5 g/dL (ref 3.5–5.2)
Alkaline Phosphatase: 45 U/L (ref 39–117)
BUN: 23 mg/dL (ref 6–23)
CO2: 30 mEq/L (ref 19–32)
Calcium: 9.3 mg/dL (ref 8.4–10.5)
Chloride: 103 mEq/L (ref 96–112)
Creatinine, Ser: 1.21 mg/dL (ref 0.40–1.50)
GFR: 54.77 mL/min — ABNORMAL LOW (ref >60.00)
Glucose, Bld: 80 mg/dL (ref 70–99)
Potassium: 4.2 mEq/L (ref 3.5–5.1)
Sodium: 138 mEq/L (ref 135–145)
Total Bilirubin: 0.6 mg/dL (ref 0.2–1.2)
Total Protein: 6.8 g/dL (ref 6.0–8.3)

## 2020-04-12 LAB — CBC WITH DIFFERENTIAL/PLATELET
Basophils Absolute: 0 10*3/uL (ref 0.0–0.1)
Basophils Relative: 0.3 % (ref 0.0–3.0)
Eosinophils Absolute: 0.1 10*3/uL (ref 0.0–0.7)
Eosinophils Relative: 2 % (ref 0.0–5.0)
HCT: 41 % (ref 39.0–52.0)
Hemoglobin: 13.7 g/dL (ref 13.0–17.0)
Lymphocytes Relative: 31.5 % (ref 12.0–46.0)
Lymphs Abs: 2.2 10*3/uL (ref 0.7–4.0)
MCHC: 33.5 g/dL (ref 30.0–36.0)
MCV: 95.8 fl (ref 78.0–100.0)
Monocytes Absolute: 0.6 10*3/uL (ref 0.1–1.0)
Monocytes Relative: 8.2 % (ref 3.0–12.0)
Neutro Abs: 4.1 10*3/uL (ref 1.4–7.7)
Neutrophils Relative %: 58 % (ref 43.0–77.0)
Platelets: 196 10*3/uL (ref 150.0–400.0)
RBC: 4.28 Mil/uL (ref 4.22–5.81)
RDW: 13.2 % (ref 11.5–15.5)
WBC: 7.1 10*3/uL (ref 4.0–10.5)

## 2020-04-12 LAB — LIPID PANEL
Cholesterol: 131 mg/dL (ref 0–200)
HDL: 46.4 mg/dL (ref >39.00)
LDL Cholesterol: 72 mg/dL (ref 0–99)
NonHDL: 84.93
Total CHOL/HDL Ratio: 3
Triglycerides: 63 mg/dL (ref 0.0–149.0)
VLDL: 12.6 mg/dL (ref 0.0–40.0)

## 2020-04-12 LAB — PSA: PSA: 4.99 ng/mL — ABNORMAL HIGH (ref 0.10–4.00)

## 2020-04-12 MED ORDER — MIRTAZAPINE 7.5 MG PO TABS: 7.5000 mg | ORAL_TABLET | Freq: Every day | ORAL | 2 refills | Status: DC

## 2020-04-12 NOTE — Assessment & Plan Note (Signed)
myrbetriiq daily Consider urology f/u

## 2020-04-12 NOTE — Telephone Encounter (Signed)
Patient called back, dicussed information. Patient verbalized understanding.

## 2020-04-12 NOTE — Assessment & Plan Note (Signed)
ghm utd Check labs  

## 2020-04-12 NOTE — Telephone Encounter (Signed)
-----   Message from [name], [place] sent at 04/12/2020  1:57 PM EST ----- Would let him know I spoke to Dr Tat and I have sent a low dose med in to help with his sleep--- we can increase it if necessary but I want to start low for a few weeks to see how it works It is also important he f/u with dr tat in Feb

## 2020-04-12 NOTE — Telephone Encounter (Signed)
Left detailed message on patients phone with information and advice.

## 2020-04-12 NOTE — Assessment & Plan Note (Signed)
F/u neurology 

## 2020-04-12 NOTE — Progress Notes (Signed)
Left pt son VM to return call to office.- JMA

## 2020-04-12 NOTE — Assessment & Plan Note (Signed)
Poorly controlled will alter medications, encouraged DASH diet, minimize caffeine and obtain adequate sleep. Report concerning symptoms and follow up as directed and as needed 

## 2020-04-13 ENCOUNTER — Telehealth: Payer: Self-pay | Admitting: Neurology

## 2020-04-13 NOTE — Telephone Encounter (Addendum)
Patient called and said, "I am confused about my medications (carbidopa-levodopa, different doses), they seem to be the same thing I need someone to go over them with me."

## 2020-04-13 NOTE — Telephone Encounter (Signed)
Please advise 

## 2020-04-14 NOTE — Telephone Encounter (Signed)
Spoke with patient who states he wanted to discuss his meds. Reviewed meds prescribed by Dr Tat. Patient had questions about Lisinopril, Mirabegron ER and Mirtazapine. I advised him that these meds were prescribed by his pcp and for him to give them a call for clarification. He stated he did not want to be a bother but thanked me for helping him. He stated that he just wanted to know what the medication he was prescribed was for. Again advised patient to contact his PCP.   Dr Tat made aware.

## 2020-04-14 NOTE — Telephone Encounter (Signed)
Tee, please go over these with the patient

## 2020-04-15 ENCOUNTER — Ambulatory Visit: Payer: PPO | Admitting: Neurology

## 2020-05-02 ENCOUNTER — Ambulatory Visit: Payer: PPO | Admitting: Family Medicine

## 2020-05-03 ENCOUNTER — Telehealth: Payer: Self-pay | Admitting: Neurology

## 2020-05-03 NOTE — Telephone Encounter (Signed)
This is too much for a phone call and we can address at visit next week

## 2020-05-03 NOTE — Telephone Encounter (Signed)
Patient's son [name] called requesting a call back from a nurse. He said he needs clarification on the dosages of the patient extended release medication, carbidopa-levodopa.

## 2020-05-03 NOTE — Telephone Encounter (Signed)
William Williamson, can you find out from son where confusion is coming from.  Son and wife were at appt last visit.  They just called about 2 weeks ago for clarification on meds (see phone note).  Trying to best help them.  He has an appt next week as well.  This patient is not very compliant, however, with recommendations.

## 2020-05-03 NOTE — Telephone Encounter (Signed)
Spoke with patients son who had questions about how the patient should take his meds. I advised patients son that the patient should take the med at 7am/11am/4pm and for the patient to take the extended release medication at bedtime. He voiced understanding then had a few questions:   He states his dad has trouble turning left or right and wants to know if that is normal for parkinsons patients?   He states his dad has trouble targeting he will sit and has missed the seat and fell a few times. Is this normal in parkinson patients?   He also states the patient has hallucinations and vivid dreams at night and wants to know if this is normal for parkinsons patients?   Advised patients son that I would speak with Dr Tat and get back to him. He voiced understanding.

## 2020-05-03 NOTE — Telephone Encounter (Signed)
Patients son notified and voiced understanding. Appointment information given and he voiced understanding.

## 2020-05-09 NOTE — Progress Notes (Signed)
Assessment/Plan:   1.  Parkinsons Disease  -increase carbidopa/levodopa 2 tablets at 7am/11am/3pm and 1 tablet at 7pm (was supposed to do that last time but didn't)  -Continue carbidopa/levodopa 50/200 CR at bedtime   -proper taking of medication is an issue here.  Son agrees compliance an issue  -multiple falls.  Needs PT.  Discussed purpose in detail.  Declined by patient.  Online resources given   2.  Sialorrhea  -He had one series of Myobloc injections on October 01, 2019 and no showed the next injection.  He has not had injections since.  He thought that it caused dry mouth.  Told him we could have lowered the dosage.  He is drooling again and reports that he doesn't botox.  3.  Insomnia  -On mirtazapine by primary care.  4.  Hypertension  -Primary care just added lisinopril.  We will need to watch that closely as Parkinson's tends to cause orthostatic hypotension.  His BP was fairly low today  5.  REM behavior disorder  -This is commonly associated with PD and the patient is experiencing this.  We discussed that this can be very serious and even harmful.  We talked about medications as well as physical barriers to put in the bed (particularly soft bed rails, pillow barriers).  We talked about moving the night stand so that it is not so close to the side of the bed.  Subjective:   William Williamson was seen today in follow up for Parkinsons disease.  My previous records were reviewed prior to todays visit as well as outside records available to me.  Patient accompanied by his son who supplements the history.  Patient's last Botox for sialorrhea on July 1.  This was his first series of injections.  He no showed his next injections on October 1 and has not had any since.  His primary care physician emailed me to discuss his case on January 10.  She saw the patient and wanted to make sure that the addition of mirtazapine was safe.  I certainly have no objection to that.  Patient emailed  me only 2 days later to ask me about the new medications that were started (which were lisinopril, Myrbetriq and mirtazapine) and I told him he needed to contact primary care who had just prescribed them 2 days earlier.  It does not appear that he addressed his concerns (whatever they were) with primary care but states that he has an upcoming appt to do so.  However, they just called me recently asking about vivid dreams and trouble turning.  He also had some questions about time-released levodopa versus the extended release.  We went over how to take his medications, but discussed that the rest would need to wait until today's visit, because it was too complex for a phone call.  States that patient misjudges where he wants to sit and may miss or nearly miss the chair.  Had lots of falls last week this week but no LOC.  Worse in the evenings (not taking the evening dose of levodopa).  No significant lightheadedness/near syncope.  He is doing better with myrbetriq.  Has had vivid dreams (guys taking him into woods) and they were so real that he was scared.  No hallucinations in day.    Current prescribed movement disorder medications: Carbidopa/levodopa 25/100, 2 tablets at 7am/11am/3pm and 1 tablet at 7pm (slightly increased last visit) - son reports pt not taking it right but son trying  to monitor - they are not taking the 7pm at all. Carbidopa/levodopa 50/200 at bedtime.  (Added last visit)  ALLERGIES:   Allergies  Allergen Reactions  . Garlic Nausea Only  . Niacin     REACTION: flushing    CURRENT MEDICATIONS:  Outpatient Encounter Medications as of 05/11/2020  Medication Sig  . carbidopa-levodopa (SINEMET CR) 50-200 MG tablet Take 1 tablet by mouth at bedtime.  . carbidopa-levodopa (SINEMET IR) 25-100 MG tablet TAKE 2 TABLETS BY MOUTH THREE TIMES DAILY  . Cholecalciferol 25 MCG (1000 UT) CHEW Chew 1 tablet (1,000 Units total) by mouth 2 (two) times daily.  . furosemide (LASIX) 20 MG tablet Take  1 tablet (20 mg total) by mouth daily.  Marland Kitchen levothyroxine (EUTHYROX) 100 MCG tablet Take 1 tablet (100 mcg total) by mouth daily before breakfast.  . lisinopril (ZESTRIL) 10 MG tablet Take 1 tablet (10 mg total) by mouth daily.  . mirabegron ER (MYRBETRIQ) 50 MG TB24 tablet Take 1 tablet (50 mg total) by mouth daily.  . mirtazapine (REMERON) 7.5 MG tablet Take 1 tablet (7.5 mg total) by mouth at bedtime.  . NONFORMULARY OR COMPOUNDED ITEM Walker  #1   Dx parkinson disease  . simvastatin (ZOCOR) 20 MG tablet Take 1 tablet (20 mg total) by mouth every evening.  . nystatin (NYSTATIN) powder Apply topically 4 (four) times daily. (Patient not taking: Reported on 05/11/2020)  . [DISCONTINUED] naftifine (NAFTIN) 1 % cream Apply topically daily. (Patient not taking: Reported on 05/11/2020)  . [DISCONTINUED] polyethylene glycol (MIRALAX / GLYCOLAX) packet Take 17 g by mouth daily as needed.  (Patient not taking: Reported on 05/11/2020)   No facility-administered encounter medications on file as of 05/11/2020.    Objective:   PHYSICAL EXAMINATION:    VITALS:   Vitals:   05/11/20 0946  BP: 106/70  Pulse: 75  SpO2: 98%  Weight: 176 lb (79.8 kg)  Height: 6\' 1"  (1.854 m)    GEN:  The patient appears stated age and is in NAD. HEENT:  Normocephalic, atraumatic.  The mucous membranes are moist. The superficial temporal arteries are without ropiness or tenderness. CV:  RRR Lungs:  CTAB Neck/HEME:  There are no carotid bruits bilaterally.  Neurological examination:  Orientation: The patient is alert and oriented x3. Cranial nerves: There is good facial symmetry with significant facial hypomimia. The speech is fluent and hypophonic. Soft palate rises symmetrically and there is no tongue deviation. Hearing is intact to conversational tone. Sensation: Sensation is intact to light touch throughout Motor: Strength is at least antigravity x4.  Movement examination: Tone: There is mild increased tone in the  bilateral upper extremities, more so on the left Abnormal movements: mild LUE rest tremor.  he has mild truncal dyskinesia Coordination:  There is decremation with RAM's, esp with toe taps on the L Gait and Station: The patient has  difficulty arising out of a deep-seated chair without the use of the hands and requires assist. 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   He has significant trouble with the turns.  He is short stepped.  He has  trouble with the turns.  He festinates  I have reviewed and interpreted the following labs independently    Chemistry      Component Value Date/Time   NA 138 04/12/2020 0714   K 4.2 04/12/2020 0714   CL 103 04/12/2020 0714   CO2 30 04/12/2020 0714   BUN 23 04/12/2020 0714   CREATININE 1.21 04/12/2020 6629  Component Value Date/Time   CALCIUM 9.3 04/12/2020 0714   ALKPHOS 45 04/12/2020 0714   AST 17 04/12/2020 0714   ALT 11 04/12/2020 0714   BILITOT 0.6 04/12/2020 0714       Lab Results  Component Value Date   WBC 7.1 04/12/2020   HGB 13.7 04/12/2020   HCT 41.0 04/12/2020   MCV 95.8 04/12/2020   PLT 196.0 04/12/2020    Lab Results  Component Value Date   TSH 2.80 01/18/2020     Total time spent on today's visit was 30 minutes, including both face-to-face time and nonface-to-face time.  Time included that spent on review of records (prior notes available to me/labs/imaging if pertinent), discussing treatment and goals, answering patient's questions and coordinating care.  Cc:  [name], DO

## 2020-05-11 ENCOUNTER — Other Ambulatory Visit: Payer: Self-pay

## 2020-05-11 ENCOUNTER — Encounter: Payer: Self-pay | Admitting: Neurology

## 2020-05-11 ENCOUNTER — Ambulatory Visit: Payer: PPO | Admitting: Neurology

## 2020-05-11 VITALS — BP 106/70 | HR 75 | Ht 73.0 in | Wt 176.0 lb

## 2020-05-11 DIAGNOSIS — G20 Parkinson's disease: Secondary | ICD-10-CM | POA: Diagnosis not present

## 2020-05-11 MED ORDER — CARBIDOPA-LEVODOPA 25-100 MG PO TABS: ORAL_TABLET | ORAL | 0 refills | Status: DC

## 2020-05-11 NOTE — Patient Instructions (Signed)
1.  Increase carbidopa/levodopa 25/100, 2 tablets at 7am/11am/3pm and 1 tablet at 7pm 2.  Continue carbidopa/levodopa 50/200 at bedtime   Online Resources for Power over Parkinson's Group February 2022  . Local [place] Online Groups  o Power over [employer] Group :   - Power Over Parkinson's Patient Education Group will be Wednesday, February 9th at 2pm via Zoom.   - Upcoming Power over Parkinson's Meetings:  2nd Wednesdays of the month at 2 pm:       March 9th, April 13th - Contact Amy Marriott at [email or web address] if interested in participating in this online group o Parkinson's Care Partners Group:    3rd Mondays, Contact Corwin Levins o Atypical Parkinsonian Patient Group:   4th Wednesdays, Contact Corwin Levins o If you are interested in participating in these online groups with Judson Roch, please contact her directly for how to join those meetings.  Her contact information is [email or web address].  She will send you a link to join the [employer].  (Please note that Corwin Levins , MSW, LCSW, has resigned her position at [facility], but will continue to lead the online groups temporarily)  . [place]:  [email or web address] o PD Health at Home continues:  Mindfulness Mondays, Expert Briefing Tuesdays, Wellness Wednesdays, Take Time Thursdays, Fitness Fridays -Listings for February 2022 are on the website o Upcoming Webinar:  Sights, Sounds, and Parkinson's.  Wednesday, February 2 @ 1 pm o Upcoming Webinar:  Conversations about Complementary Therapies and PD.  Wednesday, March 2 @ 1 pm o Geneticist, molecular) at [email or web address] o  Please check out their website to sign up for emails and see their full online offerings  . [place]:  [email or web address]  o Upcoming Webinar:   Moving with Mood Changes in Aging and Parkinson's:  A Look at Depression and Anxiety.  (This is a replay of a webinar originally  aired June 2020)  Thursday, February 17 @ 12 noon o Check out additional information on their website to see their full online offerings  . [place]:  [email or web address] o Upcoming Webinar:  The [place].  The Parkinson's You Don't See:  When your Autonomic System goes Off-Track.   Friday, February 18th.  Go to [email or web address], then click on Events, 03-06-1972 tab to register. o Care Partner Monthly Meetup.  With [employer] [place].  First Tuesday of each month, 2 pm o Check out additional information to Live Well Today on their website  . Parkinson and Movement Disorders (PMD) Alliance:  www.[email or web address] o [employer] Online:  Online Education Events o Sign up for emails, which are sent weekly to give you updates on programming and online offerings . Parkinson's Association of the [place]:  www.[email or web address] o Information on online support groups, education events, and online exercises including Yoga, Parkinson's exercises and more-LOTS of information on links to PD resources and online events o Virtual Support Group through Parkinson's Association of the [place]; next one is scheduled for Wednesday, June 01, 2020 at 2 pm. (These are typically scheduled for the 1st Wednesday of the month at 2 pm).  Visit website for details.  . Additional links for movement activities: o PWR! Moves Classes at [place] RESUMED (but are ON HOLD DUE TO COVID in February)  Contact Amy Cabool, PT [email or web address] or [phone] if interested o Here is a link to the PWR!Moves classes on Zoom from [phone] - Daily Mon-Sat at 10:00. Via Zoom, FREE  and open to all.  There is also a link below via Facebook if you use that  platform. - [email or web address].si - [email or web address].no o Parkinson's Wellness Recovery (PWR! Moves)  www.[email or web address] - Info on the PWR! Virtual Experience:  You will have access to our expertise through self-assessment, guided plans that start with the PD-specific fundamentals, educational content, tips, Q&A with an expert, and a growing Art therapist of PD-specific pre-recorded and live exercise classes of varying types and intensity - both physical and cognitive! If that is not enough, we offer 1:1 wellness consultations (in-person or virtual) to personalize your PWR! Research scientist (medical).  - Check out the PWR! Move of the month on the [place] [facility] website:  [email or web address] o [employer] Fridays:  - As part of the PD Health @ Home program, this free video series focuses each week on one aspect of fitness designed to support people living with Parkinson's.  These weekly videos highlight the [place] recent fitness guidelines for people with Parkinson's disease. -  [email or web address].dk o Dance for PD website is offering free, live-stream classes throughout the week, as well as links to [employer] of classes:  [email or web address] o Dance for Parkinson's Class:  [place].  Free offering for people with Parkinson's and care partners; virtual class.  o For more information, contact [phone] or email Ruffin Frederick at [email or web address] o Virtual dance and Pilates for Parkinson's classes: Click on the Community Tab> Parkinson's Movement Initiative Tab.  To register for classes and for more information, visit [email or web address] and click  the "community" tab.    o [employer] Parkinson's Cycling Classes  - [facility]: 1pm on Fridays-Live classes at [facility] [employer] at [email or web address] or [phone]) [facility]: Virtual Classes Mondays and Thursdays (contact [place] at [email or web address] or [phone])   o [place] levels of classes are offered Tuesdays and Thursdays:  10:30 am,  12 noon & 1:45 pm at [facility]. To observe a class or for  more information, call [phone] or email [email or web address] - PD Flow Yoga for Parkinson's:  Fridays 1-2 pm, January 7-May 27, 2020 . [employer]: o Chief Technology Officer Opportunities:  www.well-[email or web address]/caregiver-education/caregiver-support-group.  You may also contact Vickki Muff at [email or web address] or [phone].   o Virtual Caregiver Retreat, Monday, February 21st, 3-5 pm o Powerful Tools for Caregivers, 6-wk series for caregivers, planning to be in person, starting March 10th o Well-Spring Navigator:  March 23 program, a free service to help individuals and families through the journey of determining care for older adults.  The "Navigator" is a [employer], Education officer, museum, who will speak with a prospective client and/or loved ones to provide an assessment of the situation and a set of recommendations for a personalized care plan - all free of charge, and whether [facility] [employer] offers the needed service or not. If the need is not a service we provide, we are well-connected with reputable programs in town that we can refer you to.  www.well-[email or web address] or to speak with the Navigator, call [phone].

## 2020-05-13 ENCOUNTER — Encounter: Payer: Self-pay | Admitting: Family Medicine

## 2020-05-13 ENCOUNTER — Ambulatory Visit (INDEPENDENT_AMBULATORY_CARE_PROVIDER_SITE_OTHER): Payer: PPO | Admitting: Family Medicine

## 2020-05-13 ENCOUNTER — Other Ambulatory Visit: Payer: Self-pay

## 2020-05-13 VITALS — BP 110/70 | HR 74 | Temp 97.8°F | Resp 18 | Ht 73.0 in | Wt 175.0 lb

## 2020-05-13 DIAGNOSIS — G4700 Insomnia, unspecified: Secondary | ICD-10-CM | POA: Diagnosis not present

## 2020-05-13 DIAGNOSIS — G20 Parkinson's disease: Secondary | ICD-10-CM

## 2020-05-13 DIAGNOSIS — E890 Postprocedural hypothyroidism: Secondary | ICD-10-CM | POA: Diagnosis not present

## 2020-05-13 DIAGNOSIS — G20A1 Parkinson's disease without dyskinesia, without mention of fluctuations: Secondary | ICD-10-CM

## 2020-05-13 DIAGNOSIS — F3289 Other specified depressive episodes: Secondary | ICD-10-CM | POA: Diagnosis not present

## 2020-05-13 DIAGNOSIS — R32 Unspecified urinary incontinence: Secondary | ICD-10-CM | POA: Diagnosis not present

## 2020-05-13 DIAGNOSIS — I10 Essential (primary) hypertension: Secondary | ICD-10-CM | POA: Diagnosis not present

## 2020-05-13 MED ORDER — LISINOPRIL 10 MG PO TABS
10.0000 mg | ORAL_TABLET | Freq: Every day | ORAL | 3 refills | Status: DC
Start: 2020-05-13 — End: 2022-04-26

## 2020-05-13 MED ORDER — MIRTAZAPINE 7.5 MG PO TABS: 7.5000 mg | ORAL_TABLET | Freq: Every day | ORAL | 3 refills | Status: DC

## 2020-05-13 MED ORDER — MIRABEGRON ER 50 MG PO TB24: 50.0000 mg | ORAL_TABLET | Freq: Every day | ORAL | 3 refills | Status: DC

## 2020-05-13 NOTE — Patient Instructions (Signed)
How to Take Your Blood Pressure Blood pressure measures how strongly your blood is pressing against the walls of your arteries. Arteries are blood vessels that carry blood from your heart throughout your body. You can take your blood pressure at home with a machine. You may need to check your blood pressure at home:  To check if you have high blood pressure (hypertension).  To check your blood pressure over time.  To make sure your blood pressure medicine is working. Supplies needed:  Blood pressure machine, or monitor.  Dining room chair to sit in.  Table or desk.  Small notebook.  Pencil or pen. How to prepare Avoid these things for 30 minutes before checking your blood pressure:  Having drinks with caffeine in them, such as coffee or tea.  Drinking alcohol.  Eating.  Smoking.  Exercising. Do these things five minutes before checking your blood pressure:  Go to the bathroom and pee (urinate).  Sit in a dining chair. Do not sit in a soft couch or an armchair.  Be quiet. Do not talk. How to take your blood pressure Follow the instructions that came with your machine. If you have a digital blood pressure monitor, these may be the instructions: 1. Sit up straight. 2. Place your feet on the floor. Do not cross your ankles or legs. 3. Rest your left arm at the level of your heart. You may rest it on a table, desk, or chair. 4. Pull up your shirt sleeve. 5. Wrap the blood pressure cuff around the upper part of your left arm. The cuff should be 1 inch (2.5 cm) above your elbow. It is best to wrap the cuff around bare skin. 6. Fit the cuff snugly around your arm. You should be able to place only one finger between the cuff and your arm. 7. Place the cord so that it rests in the bend of your elbow. 8. Press the power button. 9. Sit quietly while the cuff fills with air and loses air. 10. Write down the numbers on the screen. 11. Wait 2-3 minutes and then repeat steps 1-10.    What do the numbers mean? Two numbers make up your blood pressure. The first number is called systolic pressure. The second is called diastolic pressure. An example of a blood pressure reading is "120 over 80" (or 120/80). If you are an adult and do not have a medical condition, use this guide to find out if your blood pressure is normal: Normal  First number: below 120.  Second number: below 80. Elevated  First number: 120-129.  Second number: below 80. Hypertension stage 1  First number: 130-139.  Second number: 80-89. Hypertension stage 2  First number: 140 or above.  Second number: 90 or above. Your blood pressure is above normal even if only the top or bottom number is above normal. Follow these instructions at home:  Check your blood pressure as often as your doctor tells you to.  Check your blood pressure at the same time every day.  Take your monitor to your next doctor's appointment. Your doctor will: ? Make sure you are using it correctly. ? Make sure it is working right.  Make sure you understand what your blood pressure numbers should be.  Tell your doctor if your medicine is causing side effects.  Keep all follow-up visits as told by your doctor. This is important. General tips:  You will need a blood pressure machine, or monitor. Your doctor can suggest a   monitor. You can buy one at a drugstore or online. When choosing one: ? Choose one with an arm cuff. ? Choose one that wraps around your upper arm. Only one finger should fit between your arm and the cuff. ? Do not choose one that measures your blood pressure from your wrist or finger. Where to find more information American Heart Association: www.heart.org Contact a doctor if:  Your blood pressure keeps being high. Get help right away if:  Your first blood pressure number is higher than 180.  Your second blood pressure number is higher than 120. Summary  Check your blood pressure at the same  time every day.  Avoid caffeine, alcohol, smoking, and exercise for 30 minutes before checking your blood pressure.  Make sure you understand what your blood pressure numbers should be. This information is not intended to replace advice given to you by your health care provider. Make sure you discuss any questions you have with your health care provider. Document Revised: 03/13/2019 Document Reviewed: 03/13/2019 Elsevier Patient Education  2021 Elsevier Inc.  

## 2020-05-13 NOTE — Progress Notes (Signed)
Patient ID: William Williamson, male    DOB: [birth year]  Age: 87 y.o. MRN: [id number]    Subjective:  Subjective  HPI [name] presents for f/u bp.  His son is with him .   They saw neuro and meds were adjusted for PD.   Pt sone is putting med boxes together for pt so he is doing better with his meds   However he did not take his meds this am  Pt is still refusing PT  Review of Systems  Constitutional: Negative for appetite change, diaphoresis, fatigue and unexpected weight change.  Eyes: Negative for pain, redness and visual disturbance.  Respiratory: Negative for cough, chest tightness, shortness of breath and wheezing.   Cardiovascular: Negative for chest pain, palpitations and leg swelling.  Endocrine: Negative for cold intolerance, heat intolerance, polydipsia, polyphagia and polyuria.  Genitourinary: Negative for difficulty urinating, dysuria and frequency.  Neurological: Negative for dizziness, light-headedness, numbness and headaches.    History Past Medical History:  Diagnosis Date  . Anemia 1952   post Oromycin for [place]  . Anisocoria    post op  . Arthritis   . Autonomic dysfunction   . GERD (gastroesophageal reflux disease)   . Glaucoma    Dr Bing Plume  . Hearing loss in right ear   . Hypercholesteremia   . Hyperthyroidism    s/p RAI  . LVH (left ventricular hypertrophy)   . Orthostatic hypotension   . Parkinson's disease [facility])     He has a past surgical history that includes Septoplasty (1970); Cardiac catheterization (07/2006); Vasectomy; RAI ablation (02/2008); Cataract extraction, bilateral; and Colonoscopy.   His family history includes Diabetes in his brother; Heart attack (age of onset: 78) in his maternal grandfather; Hypertension in his brother and sister; Osteoporosis in his mother; Pancreatic cancer in his brother; Prostate cancer in his brother; Stomach cancer in his maternal grandmother.He reports that he quit smoking about 52 years ago. He  has never used smokeless tobacco. He reports current alcohol use of about 6.0 standard drinks of alcohol per week. He reports that he does not use drugs.  Current Outpatient Medications on File Prior to Visit  Medication Sig Dispense Refill  . carbidopa-levodopa (SINEMET CR) 50-200 MG tablet Take 1 tablet by mouth at bedtime. 90 tablet 1  . carbidopa-levodopa (SINEMET IR) 25-100 MG tablet 2 tablets at 7am/11am/3pm and 1 tablet at 7pm 630 tablet 0  . Cholecalciferol 25 MCG (1000 UT) CHEW Chew 1 tablet (1,000 Units total) by mouth 2 (two) times daily.    . furosemide (LASIX) 20 MG tablet Take 1 tablet (20 mg total) by mouth daily. 90 tablet 3  . levothyroxine (EUTHYROX) 100 MCG tablet Take 1 tablet (100 mcg total) by mouth daily before breakfast. 90 tablet 1  . NONFORMULARY OR COMPOUNDED ITEM Walker  #1   Dx parkinson disease 1 each 0  . nystatin (NYSTATIN) powder Apply topically 4 (four) times daily. 15 g 0  . simvastatin (ZOCOR) 20 MG tablet Take 1 tablet (20 mg total) by mouth every evening. 30 tablet 0   No current facility-administered medications on file prior to visit.     Objective:  Objective  Physical Exam Vitals and nursing note reviewed.  Constitutional:      General: He is sleeping. Vital signs are normal.     Appearance: He is well-developed and well-nourished.  HENT:     Head: Normocephalic and atraumatic.     Mouth/Throat:     Mouth: Oropharynx  is clear and moist.  Eyes:     Extraocular Movements: EOM normal.     Pupils: Pupils are equal, round, and reactive to light.  Neck:     Thyroid: No thyromegaly.  Cardiovascular:     Rate and Rhythm: Normal rate and regular rhythm.     Heart sounds: No murmur heard.   Pulmonary:     Effort: Pulmonary effort is normal. No respiratory distress.     Breath sounds: Normal breath sounds. No wheezing or rales.  Chest:     Chest wall: No tenderness.  Musculoskeletal:        General: No tenderness or edema.     Cervical back:  Normal range of motion and neck supple.  Skin:    General: Skin is warm and dry.  Neurological:     Mental Status: He is oriented to person, place, and time.  Psychiatric:        Mood and Affect: Mood and affect normal.        Behavior: Behavior normal.        Thought Content: Thought content normal.        Judgment: Judgment normal.    BP 110/70 (BP Location: Left Arm, Patient Position: Sitting, Cuff Size: Normal)   Pulse 74   Temp 97.8 F (36.6 C) (Oral)   Resp 18   Ht 6\' 1"  (1.854 m)   Wt 175 lb (79.4 kg)   BMI 23.09 kg/m  Wt Readings from Last 3 Encounters:  05/13/20 175 lb (79.4 kg)  05/11/20 176 lb (79.8 kg)  04/11/20 181 lb (82.1 kg)     Lab Results  Component Value Date   WBC 7.1 04/12/2020   HGB 13.7 04/12/2020   HCT 41.0 04/12/2020   PLT 196.0 04/12/2020   GLUCOSE 80 04/12/2020   CHOL 131 04/12/2020   TRIG 63.0 04/12/2020   HDL 46.40 04/12/2020   LDLCALC 72 04/12/2020   ALT 11 04/12/2020   AST 17 04/12/2020   NA 138 04/12/2020   K 4.2 04/12/2020   CL 103 04/12/2020   CREATININE 1.21 04/12/2020   BUN 23 04/12/2020   CO2 30 04/12/2020   TSH 2.80 01/18/2020   PSA 4.99 (H) 04/12/2020   INR 0.9 RATIO 07/26/2006   HGBA1C 5.9 10/09/2006    DG Elbow Complete Left  Result Date: 04/11/2020 CLINICAL DATA:  Left elbow pain status post fall in April of 2021. EXAM: LEFT ELBOW - COMPLETE 3+ VIEW COMPARISON:  None. FINDINGS: There is no evidence of fracture, dislocation, or joint effusion. There is no evidence of arthropathy or other focal bone abnormality. Soft tissues are unremarkable. IMPRESSION: No significant abnormality of the left elbow. Electronically Signed   By: Miachel Roux M.D.   On: 04/11/2020 16:09   DG Wrist Complete Left  Result Date: 04/11/2020 CLINICAL DATA:  Fall, left elbow and wrist pain EXAM: LEFT WRIST - COMPLETE 3+ VIEW COMPARISON:  None FINDINGS: Degenerative changes at the 1st carpometacarpal joint and within the mid carpal row. No acute  bony abnormality. Specifically, no fracture, subluxation, or dislocation. IMPRESSION: No acute bony abnormality. Electronically Signed   By: Rolm Baptise M.D.   On: 04/11/2020 19:33     Assessment & Plan:  Plan  I am having Fabrizio A. Southern maintain his Cholecalciferol, NONFORMULARY OR COMPOUNDED ITEM, nystatin, carbidopa-levodopa, simvastatin, levothyroxine, furosemide, carbidopa-levodopa, mirabegron ER, mirtazapine, and lisinopril.  Meds ordered this encounter  Medications  . mirabegron ER (MYRBETRIQ) 50 MG TB24 tablet  Sig: Take 1 tablet (50 mg total) by mouth daily.    Dispense:  90 tablet    Refill:  3  . mirtazapine (REMERON) 7.5 MG tablet    Sig: Take 1 tablet (7.5 mg total) by mouth at bedtime.    Dispense:  90 tablet    Refill:  3  . lisinopril (ZESTRIL) 10 MG tablet    Sig: Take 1 tablet (10 mg total) by mouth daily.    Dispense:  90 tablet    Refill:  3    Problem List Items Addressed This Visit      Unprioritized   Depression - Primary    Pt is on remeron at night  It is helping with sleep--  Consider increasing dose if no improvement       Relevant Medications   mirtazapine (REMERON) 7.5 MG tablet   Hypothyroidism following radioiodine therapy    Lab Results  Component Value Date   TSH 2.80 01/18/2020   con't meds      Insomnia   Relevant Medications   mirtazapine (REMERON) 7.5 MG tablet   Parkinson disease ([place])    Per neuro Pt refusing PT      Primary hypertension    Well controlled, no changes to meds. Encouraged heart healthy diet such as the DASH diet and exercise as tolerated.  Will watch bp closely --- son will check bp at home as well F/u 3 months      Relevant Medications   lisinopril (ZESTRIL) 10 MG tablet   Urinary incontinence    myrbetriq is helping  con't med      Relevant Medications   mirabegron ER (MYRBETRIQ) 50 MG TB24 tablet      Follow-up: Return in about 3 months (around 08/10/2020), or if symptoms worsen or  fail to improve, for hypertension.  [name], DO

## 2020-05-14 DIAGNOSIS — F32A Depression, unspecified: Secondary | ICD-10-CM | POA: Insufficient documentation

## 2020-05-14 NOTE — Assessment & Plan Note (Signed)
Per neuro Pt refusing PT

## 2020-05-14 NOTE — Assessment & Plan Note (Addendum)
Lab Results  Component Value Date   TSH 2.80 01/18/2020   con't meds

## 2020-05-14 NOTE — Assessment & Plan Note (Addendum)
Well controlled, no changes to meds. Encouraged heart healthy diet such as the DASH diet and exercise as tolerated.  Will watch bp closely --- son will check bp at home as well F/u 3 months

## 2020-05-14 NOTE — Assessment & Plan Note (Signed)
myrbetriq is helping  con't med

## 2020-05-14 NOTE — Assessment & Plan Note (Signed)
Pt is on remeron at night  It is helping with sleep--  Consider increasing dose if no improvement

## 2020-06-06 ENCOUNTER — Telehealth: Payer: PPO

## 2020-07-09 ENCOUNTER — Other Ambulatory Visit: Payer: Self-pay | Admitting: Neurology

## 2020-07-09 ENCOUNTER — Other Ambulatory Visit: Payer: Self-pay | Admitting: Family Medicine

## 2020-07-09 DIAGNOSIS — E785 Hyperlipidemia, unspecified: Secondary | ICD-10-CM

## 2020-07-11 NOTE — Telephone Encounter (Signed)
Rx(s) sent to pharmacy electronically.  

## 2020-07-23 ENCOUNTER — Other Ambulatory Visit: Payer: Self-pay | Admitting: Neurology

## 2020-08-12 ENCOUNTER — Other Ambulatory Visit: Payer: Self-pay

## 2020-08-12 ENCOUNTER — Encounter: Payer: Self-pay | Admitting: Family Medicine

## 2020-08-12 ENCOUNTER — Telehealth (INDEPENDENT_AMBULATORY_CARE_PROVIDER_SITE_OTHER): Payer: PPO | Admitting: Family Medicine

## 2020-08-12 VITALS — BP 117/54 | HR 70

## 2020-08-12 DIAGNOSIS — R296 Repeated falls: Secondary | ICD-10-CM | POA: Diagnosis not present

## 2020-08-12 DIAGNOSIS — R32 Unspecified urinary incontinence: Secondary | ICD-10-CM

## 2020-08-12 DIAGNOSIS — I10 Essential (primary) hypertension: Secondary | ICD-10-CM

## 2020-08-12 DIAGNOSIS — G20 Parkinson's disease: Secondary | ICD-10-CM | POA: Diagnosis not present

## 2020-08-12 DIAGNOSIS — W19XXXA Unspecified fall, initial encounter: Secondary | ICD-10-CM

## 2020-08-12 DIAGNOSIS — N400 Enlarged prostate without lower urinary tract symptoms: Secondary | ICD-10-CM | POA: Diagnosis not present

## 2020-08-12 NOTE — Assessment & Plan Note (Signed)
Pt still refusing pt/ ot  Encouraged pt to use his walking all the time

## 2020-08-12 NOTE — Progress Notes (Signed)
Virtual telephone visit    Virtual Visit via Telephone Note   This visit type was conducted due to national recommendations for restrictions regarding the COVID-19 Pandemic (e.g. social distancing) in an effort to limit this patient's exposure and mitigate transmission in our community. Due to his co-morbid illnesses, this patient is at least at moderate risk for complications without adequate follow up. This format is felt to be most appropriate for this patient at this time. The patient did not have access to video technology or had technical difficulties with video requiring transitioning to audio format only (telephone). Physical exam was limited to content and character of the telephone converstion. Alinda Dooms was able to get the patient set up on a telephone visit.   Patient location: home with his son participating in visit as well.   Patient and provider in visit Provider location: home   I discussed the limitations of evaluation and management by telemedicine and the availability of in person appointments. The patient expressed understanding and agreed to proceed.   Visit Date: 08/12/2020  Today's healthcare provider: [name], DO     Subjective:    Patient ID: William Williamson, male    DOB: 1932-07-28, 87 y.o.   MRN: [id number]  Chief Complaint  Patient presents with  . Hypertension  . Follow-up    HPI Patient is in today for f/u bp --- his son is in visit as well.  He is still not using his walker regularly and is falling often.  He still refuses pt/ ot.  His son states he is better at taking his PD meds but compliance is still an issue.   He is also hallucinating more -- he got up in feb at 4am and was outside in boxers and a t shirt.  He also recently was hallucinating about being in a meeting and everyone left.  Pt son states pt was upset after that one.    Neuro told pt and son that the meds for hallucinations had side effects that could be  worse than the hallucinations.    Past Medical History:  Diagnosis Date  . Anemia 1952   post Oromycin for [place]  . Anisocoria    post op  . Arthritis   . Autonomic dysfunction   . GERD (gastroesophageal reflux disease)   . Glaucoma    Dr Bing Plume  . Hearing loss in right ear   . Hypercholesteremia   . Hyperthyroidism    s/p RAI  . LVH (left ventricular hypertrophy)   . Orthostatic hypotension   . Parkinson's disease [facility])     Past Surgical History:  Procedure Laterality Date  . CARDIAC CATHETERIZATION  07/2006   Dr.  Eustace Quail, negative  . CATARACT EXTRACTION, BILATERAL     OS retinal surgery for post op floaters  . COLONOSCOPY     negative X 3  . RAI ablation  02/2008   hyperthyroidism  . SEPTOPLASTY  1970  . VASECTOMY      Family History  Problem Relation Age of Onset  . Osteoporosis Mother   . Hypertension Sister   . Hypertension Brother         X 3  . Diabetes Brother   . Prostate cancer Brother   . Stomach cancer Maternal Grandmother   . Pancreatic cancer Brother        ???  . Heart attack Maternal Grandfather 9  . Stroke Neg Hx     Social History  Socioeconomic History  . Marital status: Widowed    Spouse name: Not on file  . Number of children: Not on file  . Years of education: Not on file  . Highest education level: Not on file  Occupational History  . Occupation: Retired    Comment: personnel  Tobacco Use  . Smoking status: Former Smoker    Quit date: 04/02/1968    Years since quitting: 52.3  . Smokeless tobacco: Never Used  Vaping Use  . Vaping Use: Never used  Substance and Sexual Activity  . Alcohol use: Yes    Alcohol/week: 6.0 standard drinks    Types: 6 Cans of beer per week  . Drug use: No  . Sexual activity: Not on file  Other Topics Concern  . 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 carbidopa-levodopa (SINEMET CR) 50-200 MG tablet TAKE 1 TABLET BY MOUTH AT BEDTIME 90 tablet 0  . carbidopa-levodopa (SINEMET IR) 25-100 MG tablet 2 tablets at 7am/11am/3pm and 1 tablet at 7pm 630 tablet 0  . Cholecalciferol 25 MCG (1000 UT) CHEW Chew 1 tablet (1,000 Units total) by mouth 2 (two) times daily.    . furosemide (LASIX) 20 MG tablet Take 1 tablet (20 mg total) by mouth daily. 90 tablet 3  . levothyroxine (EUTHYROX) 100 MCG tablet Take 1 tablet (100 mcg total) by mouth daily before breakfast. 90 tablet 1  . lisinopril (ZESTRIL) 10 MG tablet Take 1 tablet (10 mg total) by mouth daily. 90 tablet 3  . mirabegron ER (MYRBETRIQ) 50 MG TB24 tablet Take 1 tablet (50 mg total) by mouth daily. 90 tablet 3  . mirtazapine (REMERON) 7.5 MG tablet Take 1 tablet (7.5 mg total) by mouth at bedtime. 90 tablet 3  . NONFORMULARY OR COMPOUNDED ITEM Walker  #1   Dx parkinson disease 1 each 0  . nystatin (NYSTATIN) powder Apply topically 4 (four) times daily. 15 g 0  . simvastatin (ZOCOR) 20 MG tablet Take 1 tablet (20 mg total) by mouth daily at 6 PM. 90 tablet 1   No facility-administered medications prior to visit.    Allergies  Allergen Reactions  . Garlic Nausea Only  . Niacin     REACTION: flushing    Review of Systems  Constitutional: Negative for chills, fever and malaise/fatigue.  HENT: Negative for congestion and hearing loss.   Eyes: Negative for discharge.  Respiratory: Negative for cough, sputum production and shortness of breath.   Cardiovascular: Negative for chest pain, palpitations and leg swelling.  Gastrointestinal: Negative for abdominal pain, blood in stool, constipation, diarrhea, heartburn, nausea and vomiting.  Genitourinary:  Negative for dysuria, frequency, hematuria and urgency.  Musculoskeletal: Negative for back pain, falls and myalgias.  Skin: Negative for rash.  Neurological: Positive for tremors and weakness. Negative for dizziness, sensory change, loss of consciousness and headaches.  Endo/Heme/Allergies: Negative for environmental allergies. Does not bruise/bleed easily.  Psychiatric/Behavioral: Positive for hallucinations. Negative for depression and suicidal ideas. The patient is not nervous/anxious and does not have insomnia.        Objective:  Physical Exam Vitals and nursing note reviewed.  Constitutional:      General: He is sleeping.     Appearance: He is well-developed.  Neck:     Thyroid: No thyromegaly.  Neurological:     Gait: Gait abnormal.  Psychiatric:        Mood and Affect: Mood is depressed. Affect is blunt.        Speech: Speech normal.        Behavior: Behavior normal.        Thought Content: Thought content normal.        Cognition and Memory: Cognition normal.     Comments: He does not remember how he got outside in Feb  He states the recent episode -- he woke up from sleep and was confused --- he states he must have been dreaming about being in a meeting but his son said the hallucination cont after he woke up      BP (!) 117/54   Pulse 70  Wt Readings from Last 3 Encounters:  05/13/20 175 lb (79.4 kg)  05/11/20 176 lb (79.8 kg)  04/11/20 181 lb (82.1 kg)    Diabetic Foot Exam - Simple   No data filed    Lab Results  Component Value Date   WBC 7.1 04/12/2020   HGB 13.7 04/12/2020   HCT 41.0 04/12/2020   PLT 196.0 04/12/2020   GLUCOSE 80 04/12/2020   CHOL 131 04/12/2020   TRIG 63.0 04/12/2020   HDL 46.40 04/12/2020   LDLCALC 72 04/12/2020   ALT 11 04/12/2020   AST 17 04/12/2020   NA 138 04/12/2020   K 4.2 04/12/2020   CL 103 04/12/2020   CREATININE 1.21 04/12/2020   BUN 23 04/12/2020   CO2 30 04/12/2020   TSH 2.80 01/18/2020   PSA 4.99 (H)  04/12/2020   INR 0.9 RATIO 07/26/2006   HGBA1C 5.9 10/09/2006    Lab Results  Component Value Date   TSH 2.80 01/18/2020   Lab Results  Component Value Date   WBC 7.1 04/12/2020   HGB 13.7 04/12/2020   HCT 41.0 04/12/2020   MCV 95.8 04/12/2020   PLT 196.0 04/12/2020   Lab Results  Component Value Date   NA 138 04/12/2020   K 4.2 04/12/2020   CO2 30 04/12/2020   GLUCOSE 80 04/12/2020   BUN 23 04/12/2020   CREATININE 1.21 04/12/2020   BILITOT 0.6 04/12/2020   ALKPHOS 45 04/12/2020   AST 17 04/12/2020   ALT 11 04/12/2020   PROT 6.8 04/12/2020   ALBUMIN 4.5 04/12/2020   CALCIUM 9.3 04/12/2020   GFR 54.77 (L) 04/12/2020   Lab Results  Component Value Date   CHOL 131 04/12/2020   Lab Results  Component Value Date   HDL 46.40 04/12/2020   Lab Results  Component Value Date   LDLCALC 72 04/12/2020   Lab Results  Component Value Date   TRIG 63.0 04/12/2020   Lab Results  Component Value Date   CHOLHDL 3 04/12/2020   Lab Results  Component Value Date   HGBA1C 5.9 10/09/2006       Assessment & Plan:   Problem List Items Addressed This Visit      Unprioritized   Frequent falls    Pt still refusing pt/ ot  Encouraged pt to use his walking all the time       Parkinson disease ([place])    Still noncompliant with meds but doing better per son Pt still refusing  to use walker all the time and is falling more--- again we discussed pt/ ot but pt refused  Pt other sons are coming into town so [name] can go on vacation --- they will all get together to discuss their father when he gets back They are not talking about assisted living etc yet unless things get worse       Primary hypertension - Primary    Well controlled, no changes to meds. Encouraged heart healthy diet such as the DASH diet and exercise as tolerated.       Prostate enlargement    Lab Results  Component Value Date   PSA 4.99 (H) 04/12/2020   PSA 4.85 (H) 08/16/2016   PSA 4.01 (H) 06/23/2015     Stable Per urology      Urinary incontinence    myrbetriq is helping        Other Visit Diagnoses    Fall, initial encounter          I am having Keval A. Toppins maintain his Cholecalciferol, NONFORMULARY OR COMPOUNDED ITEM, nystatin, levothyroxine, furosemide, carbidopa-levodopa, mirabegron ER, mirtazapine, lisinopril, simvastatin, and carbidopa-levodopa.  No orders of the defined types were placed in this encounter.    I discussed the assessment and treatment plan with the patient. The patient was provided an opportunity to ask questions and all were answered. The patient agreed with the plan and demonstrated an understanding of the instructions.   The patient was advised to call back or seek an in-person evaluation if the symptoms worsen or if the condition fails to improve as anticipated.  I provided 35 minutes of non-face-to-face time during this encounter.   [name], DO [place] [facility] [phone] (phone) [phone] (fax)  [place]

## 2020-08-12 NOTE — Assessment & Plan Note (Signed)
Lab Results  Component Value Date   PSA 4.99 (H) 04/12/2020   PSA 4.85 (H) 08/16/2016   PSA 4.01 (H) 06/23/2015    Stable Per urology

## 2020-08-12 NOTE — Assessment & Plan Note (Signed)
Well controlled, no changes to meds. Encouraged heart healthy diet such as the DASH diet and exercise as tolerated.  °

## 2020-08-12 NOTE — Assessment & Plan Note (Signed)
myrbetriq is helping

## 2020-08-12 NOTE — Assessment & Plan Note (Signed)
Still noncompliant with meds but doing better per son Pt still refusing to use walker all the time and is falling more--- again we discussed pt/ ot but pt refused  Pt other sons are coming into town so William Williamson can go on vacation --- they will all get together to discuss their father when he gets back They are not talking about assisted living etc yet unless things get worse

## 2020-09-01 ENCOUNTER — Ambulatory Visit (INDEPENDENT_AMBULATORY_CARE_PROVIDER_SITE_OTHER): Payer: PPO | Admitting: Family Medicine

## 2020-09-01 ENCOUNTER — Other Ambulatory Visit: Payer: Self-pay

## 2020-09-01 ENCOUNTER — Ambulatory Visit (HOSPITAL_BASED_OUTPATIENT_CLINIC_OR_DEPARTMENT_OTHER)
Admission: RE | Admit: 2020-09-01 | Discharge: 2020-09-01 | Disposition: A | Discharge status: Home / Self Care | Payer: PPO | Source: Ambulatory Visit | Attending: Family Medicine | Admitting: Family Medicine

## 2020-09-01 ENCOUNTER — Encounter: Payer: Self-pay | Admitting: Family Medicine

## 2020-09-01 VITALS — BP 140/110 | HR 88 | Temp 97.8°F | Resp 18 | Ht 73.0 in | Wt 173.2 lb

## 2020-09-01 DIAGNOSIS — M79605 Pain in left leg: Secondary | ICD-10-CM | POA: Diagnosis not present

## 2020-09-01 DIAGNOSIS — M545 Low back pain, unspecified: Secondary | ICD-10-CM | POA: Diagnosis not present

## 2020-09-01 DIAGNOSIS — G20 Parkinson's disease: Secondary | ICD-10-CM | POA: Diagnosis not present

## 2020-09-01 DIAGNOSIS — I10 Essential (primary) hypertension: Secondary | ICD-10-CM | POA: Diagnosis not present

## 2020-09-01 MED ORDER — TRAMADOL HCL 50 MG PO TABS: 50.0000 mg | ORAL_TABLET | Freq: Three times a day (TID) | ORAL | 0 refills | Status: AC | PRN

## 2020-09-01 MED ORDER — METHOCARBAMOL 500 MG PO TABS: 500.0000 mg | ORAL_TABLET | Freq: Four times a day (QID) | ORAL | 0 refills | Status: DC

## 2020-09-01 NOTE — Assessment & Plan Note (Signed)
Muscle relaxer and tramadol  Son and pt warned about sedating effects of meds  Check xray  Offered pt again and pt refused

## 2020-09-01 NOTE — Patient Instructions (Signed)
Back Injury Prevention Back injuries can be very painful. They can also be difficult to heal. After having one back injury, you are more likely to have another one again. It is important to learn how to avoid injuring or re-injuring your back. The following tips can help you to prevent a back injury. What actions can I take to prevent back injuries? Nutrition changes Talk with your health care provider about your overall diet, and especially about foods that strengthen your bones.  Ask your health care provider how much calcium and vitamin D you need each day. These nutrients help to prevent weakening of the bones (osteoporosis). Osteoporosis can cause broken (fractured) bones, which lead to back pain.  Eat foods that are good sources of calcium. These include dairy products, green leafy vegetables, and products that have had calcium added to them (fortified).  Eat foods that are good sources of vitamin D. These include milk and foods that are fortified with vitamin D.  If needed, take supplements and vitamins as directed by your health care provider. Physical fitness Physical fitness strengthens your bones and your muscles. It also increases your balance and strength.  Exercise for 30 minutes per day on most days of the week, or as directed by your health care provider. Make sure to: ? Do aerobic exercises, such as walking, jogging, biking, or swimming. ? Do exercises that increase balance and strength, such as tai chi and yoga. These can decrease your risk of falling and injuring your back. ? Do stretching exercises to help with flexibility. ? Develop strong abdominal muscles. Your abdominal muscles provide a lot of the support that your back needs.  Maintain a healthy weight. This helps to decrease your risk of a back injury. Good posture Prevent back injuries by developing and maintaining a good posture. To do this successfully:  Sit up and stand up straight. Avoid leaning forward when  you sit or hunching over when you stand.  Choose chairs that have good low-back (lumbar) support.  If you work at a desk, sit close to it so you do not need to lean over. Keep your chin tucked in. Keep your neck drawn back, and keep your elbows bent at a right angle.  Sit high and close to the steering wheel when you drive. Add a lumbar support to your car seat, if needed.  Avoid sitting or standing in one position for very long. Take breaks to get up, stretch, and walk around at least one time every hour. Take breaks every hour if you are driving for long periods of time.  Sleep on your side with your knees slightly bent, or sleep on your back with a pillow under your knees.         Lifting, twisting, and reaching Back injuries are more likely to occur when carrying loads and twisting at the same time. When you bend and lift, or reach for items that are high up in shelves, use positions that put less stress on your back.  Heavy lifting ? Avoid heavy lifting, especially the kind of heavy lifting that is repetitive. If you must do heavy lifting:  Stretch before lifting.  Work slowly.  Rest between lifts.  Use a tool such as a cart or a dolly to move objects.  Make several small trips instead of carrying one heavy load.  Ask for help when you need it, especially when moving big or heavy objects. ? Follow these steps when lifting:  Stand with your feet   shoulder-width apart.  Get as close to the object as you can. Do not try to pick up a heavy object that is far from your body.  Use handles or lifting straps if they are available.  Bend at your knees. Squat down, but keep your heels off the floor.  Keep your shoulders pulled back, your chin tucked in, and your back straight.  Lift the object slowly while you tighten the muscles in your legs, abdomen, and buttocks. Keep the object as close to the center of your body as possible. ? Follow these steps when putting down a heavy  load:  Stand with your feet shoulder-width apart.  Lower the object slowly while you tighten the muscles in your legs, abdomen, and buttocks. Keep the object as close to the center of your body as possible.  Keep your shoulders pulled back, your chin tucked in, and your back straight.  Bend at your knees. Squat down, but keep your heels off the floor.  Use handles or lifting straps if they are available.  Twisting and reaching ? Avoid lifting heavy objects above your waist. ? Do not twist at your waist while you are lifting or carrying a load. If you need to turn, move your feet. ? Do not bend over without bending at your knees. ? Avoid reaching over your head, across a table, or for an object on a high surface.   Other changes  Avoid wet floors and icy ground. Keep sidewalks clear of ice to prevent falls.  Do not sleep on a mattress that is too soft or too hard.  Put heavier objects on shelves at waist level, and put lighter objects on lower or higher shelves.  Find ways to decrease your stress, such as by exercising, getting a massage, or practicing relaxation techniques. Stress can build up in your muscles. Tense muscles are more vulnerable to injury.  Talk with your health care provider if you feel anxious or depressed. These conditions can make back pain worse.  Wear flat heel shoes with cushioned soles.  Use both shoulder straps when carrying a backpack.  Do not use any products that contain nicotine or tobacco, such as cigarettes and e-cigarettes. If you need help quitting, ask your health care provider.   Summary  Back injuries can be very painful and difficult to heal.  You can prevent injuring or re-injuring your back by making nutrition changes, working on being physically fit, developing a good posture, and lifting heavy objects in a safe way.  Making other changes can also help to prevent back injuries. These include eating a healthy diet, exercising regularly and  maintaining a healthy weight. This information is not intended to replace advice given to you by your health care provider. Make sure you discuss any questions you have with your health care provider. Document Revised: 12/10/2018 Document Reviewed: 05/04/2017 Elsevier Patient Education  2021 [employer] Inc.  

## 2020-09-01 NOTE — Assessment & Plan Note (Signed)
Per neuro  Pt with frequent falls  Still refusing PT

## 2020-09-01 NOTE — Assessment & Plan Note (Addendum)
High today Pt is not sure he took his meds today His son will check his medication box

## 2020-09-01 NOTE — Progress Notes (Signed)
Subjective:   By signing my name below, I, [name], attest that this documentation has been prepared under the direction and in the presence of Dr. Roma Schanz, DO. 09/01/2020     Patient ID: William Williamson, male    DOB: 24-Dec-1932, 87 y.o.   MRN: [id number]  Chief Complaint  Patient presents with  . Back Pain    Pt states having lower back pain that is shooting down his legs. Pt states having pain with walking.    HPI Patient is in today for a office visit. His son is present during the visit to assist with communication.  He complains of lower back pain that radiates down to his left leg. Recently after bending down to pick something up he felt pain shooting from his back down to his left leg. While walking the pain goes up to his left hip and makes walking unbearable. Since then he avoids walking. He also complains of issues sleeping. He reports waking up at 5 am this morning and feeling like he has to go to the hospital. His blood pressure is elevated during this visit. He does not remember if he had taken his medication today.  BP Readings from Last 3 Encounters:  09/01/20 (!) 140/110  08/12/20 (!) 117/54  05/13/20 110/70      Past Medical History:  Diagnosis Date  . Anemia 1952   post Oromycin for [place]mia  . Anisocoria    post op  . Arthritis   . Autonomic dysfunction   . GERD (gastroesophageal reflux disease)   . Glaucoma    Dr Bing Plume  . Hearing loss in right ear   . Hypercholesteremia   . Hyperthyroidism    s/p RAI  . LVH (left ventricular hypertrophy)   . Orthostatic hypotension   . Parkinson's disease [facility])     Past Surgical History:  Procedure Laterality Date  . CARDIAC CATHETERIZATION  07/2006   Dr.  Eustace Quail, negative  . CATARACT EXTRACTION, BILATERAL     OS retinal surgery for post op floaters  . COLONOSCOPY     negative X 3  . RAI ablation  02/2008   hyperthyroidism  . SEPTOPLASTY  1970  . VASECTOMY      Family  History  Problem Relation Age of Onset  . Osteoporosis Mother   . Hypertension Sister   . Hypertension Brother         X 3  . Diabetes Brother   . Prostate cancer Brother   . Stomach cancer Maternal Grandmother   . Pancreatic cancer Brother        ???  . Heart attack Maternal Grandfather 33  . Stroke Neg Hx     Social History   Socioeconomic History  . Marital status: Widowed    Spouse name: Not on file  . Number of children: Not on file  . Years of education: Not on file  . Highest education level: Not on file  Occupational History  . Occupation: Retired    Comment: personnel  Tobacco Use  . Smoking status: Former Smoker    Quit date: 04/02/1968    Years since quitting: 52.4  . Smokeless tobacco: Never Used  Vaping Use  . Vaping Use: Never used  Substance and Sexual Activity  . Alcohol use: Yes    Alcohol/week: 6.0 standard drinks    Types: 6 Cans of beer per week  . Drug use: No  . Sexual activity: Not on file  Other Topics Concern  .  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 carbidopa-levodopa (SINEMET CR) 50-200 MG tablet TAKE 1 TABLET BY MOUTH AT BEDTIME 90 tablet 0  . carbidopa-levodopa (SINEMET IR) 25-100 MG tablet 2 tablets at 7am/11am/3pm and 1 tablet at 7pm 630 tablet 0  . Cholecalciferol 25 MCG (1000 UT) CHEW Chew 1 tablet (1,000 Units total) by mouth 2 (two) times daily.    . furosemide (LASIX) 20 MG tablet Take 1 tablet (20 mg total) by mouth daily. 90 tablet 3  . levothyroxine (EUTHYROX) 100 MCG tablet Take 1 tablet (100 mcg total) by mouth daily before breakfast. 90 tablet 1  .  lisinopril (ZESTRIL) 10 MG tablet Take 1 tablet (10 mg total) by mouth daily. 90 tablet 3  . mirabegron ER (MYRBETRIQ) 50 MG TB24 tablet Take 1 tablet (50 mg total) by mouth daily. 90 tablet 3  . mirtazapine (REMERON) 7.5 MG tablet Take 1 tablet (7.5 mg total) by mouth at bedtime. 90 tablet 3  . NONFORMULARY OR COMPOUNDED ITEM Walker  #1   Dx parkinson disease 1 each 0  . nystatin (NYSTATIN) powder Apply topically 4 (four) times daily. 15 g 0  . simvastatin (ZOCOR) 20 MG tablet Take 1 tablet (20 mg total) by mouth daily at 6 PM. 90 tablet 1   No facility-administered medications prior to visit.    Allergies  Allergen Reactions  . Garlic Nausea Only  . Niacin     REACTION: flushing    Review of Systems  Constitutional: Negative for chills, fever, malaise/fatigue and weight loss.  Cardiovascular: Negative for chest pain and palpitations.  Musculoskeletal: Positive for back pain, joint pain (Left hip) and myalgias (Left leg).  Neurological: Positive for tremors. Negative for weakness.  Psychiatric/Behavioral: The patient has insomnia.        Objective:    Physical Exam Constitutional:      General: He is not in acute distress.    Appearance: Normal appearance. He is not ill-appearing.  HENT:     Head: Normocephalic and atraumatic.     Right Ear: External ear normal.     Left Ear: External ear normal.  Eyes:     Extraocular Movements: Extraocular movements intact.     Pupils: Pupils are equal, round, and reactive to light.  Musculoskeletal:     Comments: Normal strength in lower extremities.  Skin:    General: Skin is warm and dry.  Neurological:     Mental Status: He is alert and oriented to person, place, and time.  Psychiatric:        Behavior: Behavior normal.     BP (!) 140/110 (BP Location: Left Arm, Patient Position: Sitting, Cuff Size: Normal)   Pulse 88   Temp 97.8 F (36.6 C) (Oral)   Resp 18   Ht 6\' 1"  (1.854 m)   Wt 173 lb 3.2 oz (78.6 kg)   SpO2 95%    BMI 22.85 kg/m  Wt Readings from Last 3 Encounters:  09/01/20 173 lb 3.2 oz (78.6 kg)  05/13/20 175 lb (79.4 kg)  05/11/20 176 lb (79.8 kg)  Diabetic Foot Exam - Simple   No data filed    Lab Results  Component Value Date   WBC 7.1 04/12/2020   HGB 13.7 04/12/2020   HCT 41.0 04/12/2020   PLT 196.0 04/12/2020   GLUCOSE 80 04/12/2020   CHOL 131 04/12/2020   TRIG 63.0 04/12/2020   HDL 46.40 04/12/2020   LDLCALC 72 04/12/2020   ALT 11 04/12/2020   AST 17 04/12/2020   NA 138 04/12/2020   K 4.2 04/12/2020   CL 103 04/12/2020   CREATININE 1.21 04/12/2020   BUN 23 04/12/2020   CO2 30 04/12/2020   TSH 2.80 01/18/2020   PSA 4.99 (H) 04/12/2020   INR 0.9 RATIO 07/26/2006   HGBA1C 5.9 10/09/2006    Lab Results  Component Value Date   TSH 2.80 01/18/2020   Lab Results  Component Value Date   WBC 7.1 04/12/2020   HGB 13.7 04/12/2020   HCT 41.0 04/12/2020   MCV 95.8 04/12/2020   PLT 196.0 04/12/2020   Lab Results  Component Value Date   NA 138 04/12/2020   K 4.2 04/12/2020   CO2 30 04/12/2020   GLUCOSE 80 04/12/2020   BUN 23 04/12/2020   CREATININE 1.21 04/12/2020   BILITOT 0.6 04/12/2020   ALKPHOS 45 04/12/2020   AST 17 04/12/2020   ALT 11 04/12/2020   PROT 6.8 04/12/2020   ALBUMIN 4.5 04/12/2020   CALCIUM 9.3 04/12/2020   GFR 54.77 (L) 04/12/2020   Lab Results  Component Value Date   CHOL 131 04/12/2020   Lab Results  Component Value Date   HDL 46.40 04/12/2020   Lab Results  Component Value Date   LDLCALC 72 04/12/2020   Lab Results  Component Value Date   TRIG 63.0 04/12/2020   Lab Results  Component Value Date   CHOLHDL 3 04/12/2020   Lab Results  Component Value Date   HGBA1C 5.9 10/09/2006       Assessment & Plan:   Problem List Items Addressed This Visit      Unprioritized   Low back pain radiating to left leg - Primary    Muscle relaxer and tramadol  Son and pt warned about sedating effects of meds  Check xray  Offered  pt again and pt refused       Relevant Medications   traMADol (ULTRAM) 50 MG tablet   methocarbamol (ROBAXIN) 500 MG tablet   Other Relevant Orders   DG Lumbar Spine Complete   Parkinson disease ([place])    Per neuro  Pt with frequent falls  Still refusing PT      Primary hypertension    High today Pt is not sure he took his meds today His son will check his medication box           Meds ordered this encounter  Medications  . traMADol (ULTRAM) 50 MG tablet    Sig: Take 1 tablet (50 mg total) by mouth every 8 (eight) hours as needed for up to 5 days.    Dispense:  15 tablet    Refill:  0  . methocarbamol (ROBAXIN) 500 MG tablet    Sig: Take 1 tablet (500 mg total) by mouth 4 (four) times daily.    Dispense:  45 tablet    Refill:  0    I, Dr. Roma Schanz, DO, personally preformed the services described in this documentation.  All medical record entries made by the scribe were at my direction and in my presence.  I have reviewed the chart and discharge instructions (if applicable) and agree that the record reflects my personal performance and is accurate and complete. 09/01/2020   I,[name],acting as a scribe for [name], DO.,have documented all relevant documentation on the behalf of [name], DO,as directed by  [name], DO while in the presence of [name], DO.   [name], DO

## 2020-09-02 ENCOUNTER — Encounter: Payer: Self-pay | Admitting: Family Medicine

## 2020-09-03 ENCOUNTER — Other Ambulatory Visit: Payer: Self-pay | Admitting: Family Medicine

## 2020-09-03 DIAGNOSIS — E039 Hypothyroidism, unspecified: Secondary | ICD-10-CM

## 2020-09-05 ENCOUNTER — Other Ambulatory Visit: Payer: Self-pay | Admitting: Family Medicine

## 2020-09-05 DIAGNOSIS — E039 Hypothyroidism, unspecified: Secondary | ICD-10-CM

## 2020-09-07 ENCOUNTER — Telehealth: Payer: PPO

## 2020-09-13 ENCOUNTER — Ambulatory Visit (INDEPENDENT_AMBULATORY_CARE_PROVIDER_SITE_OTHER): Payer: PPO | Admitting: Pharmacist

## 2020-09-13 DIAGNOSIS — N400 Enlarged prostate without lower urinary tract symptoms: Secondary | ICD-10-CM

## 2020-09-13 DIAGNOSIS — E785 Hyperlipidemia, unspecified: Secondary | ICD-10-CM | POA: Diagnosis not present

## 2020-09-13 DIAGNOSIS — E890 Postprocedural hypothyroidism: Secondary | ICD-10-CM | POA: Diagnosis not present

## 2020-09-13 DIAGNOSIS — R296 Repeated falls: Secondary | ICD-10-CM

## 2020-09-13 DIAGNOSIS — R32 Unspecified urinary incontinence: Secondary | ICD-10-CM

## 2020-09-13 DIAGNOSIS — I10 Essential (primary) hypertension: Secondary | ICD-10-CM | POA: Diagnosis not present

## 2020-09-13 DIAGNOSIS — G20 Parkinson's disease: Secondary | ICD-10-CM

## 2020-09-13 NOTE — Patient Instructions (Signed)
William Williamson  It was a pleasure speaking with you today. Please feel free to contact me if you have any questions or concerns. Below is information regarding your health goals.   Keep up the good work!  Cherre Robins, PharmD Clinical Pharmacist [facility] [address] [facility] [phone]  Visit Information  PATIENT GOALS:  Goals Addressed             This Visit's Progress    Chronic Care Management Pharmacy Care Plan   On track    CARE PLAN ENTRY (see longitudinal plan of care for additional care plan information)  Current Barriers:  Chronic Disease Management support, education, and care coordination needs related to HTN, HLD, Hypothyroidism, Parkinson Disease, Vitamin D Deficiency, BPH, Lower Extremity Edema, Tinea Corporis, Glaucoma, Constipation   Hypertension BP Readings from Last 3 Encounters:  09/01/20 (!) 140/110  08/12/20 (!) 117/54  05/13/20 110/70  Pharmacist Clinical Goal(s): Over the next 90 days, patient will work with PharmD and providers to maintain BP goal <140/90 Current regimen:  Lisinopril 10mg  daily  Interventions: Requested patient to check his blood pressure once a week and if experiences symptoms of low blood pressure times per week Patient self care activities - Over the next 90 days, patient will: Check blood pressure weekly, document, and provide at future appointments Monitor for symptoms of low blood pressure (dizziness, weakness) and report to provider.  Ensure daily salt intake < 2300 mg/day  Hyperlipidemia Lab Results  Component Value Date/Time   LDLCALC 72 04/12/2020 07:14 AM  Pharmacist Clinical Goal(s): Over the next 90 days, patient will work with PharmD and providers to maintain LDL goal < 100 Current regimen:  Simvastatin 20mg  daily Patient self care activities - Over the next 90 days, patient will: Maintain cholesterol medication regimen.   Hypothyroidism Pharmacist Clinical Goal(s) Over the next 90 days,  patient will work with PharmD and providers to reduce symptoms associated with hypothyroidism Current regimen:  Levothyroxine 121mcg daily Patient self care activities - Over the next 90 days, patient will: Take levothyroxine daily in morning Recheck thyroid panel with next labs  Parkinson's Disease:  Pharmacist Clinical Goal(s): Over the next 90 days, patient will work with PharmD and providers to decrease symptoms of Parkinson' disease while minimizing side effects.  Current regimen:  Carbidopa / Levodopa CR 50/200mg  take 1 tablet at bedtime Carbidopa / levodopa IR 25/100 - take 2 tablets at 7am; 11am and 3pm; take 1 tablet at 7pm  Interventions: Discussed benefits of current therapy for Parkinson's  Discussed potential side effects to monitor for.  Patient self care activities - Over the next 90 days, patient will: Continue to follow up with Dr Tat.  Continue current regimen for treatment of Parkinson's Disease  Elevated PSA / BPH with urinary frequency at night: Pharmacist Clinical Goal(s): Over the next 90 days, patient will work with PharmD and providers to decreased frequency of night time bathroom trips and monitor PSA Current regimen:  Myrbetriq ER 50mg  daily Patient self care activities - Over the next 90 days, patient will: Continue current therapy for urinary frequency Check PSA yearly.   Health Maintenance  Pharmacist Clinical Goal(s) Over the next 90 days, patient will work with PharmD and providers to complete health maintenance screenings/vaccinations Interventions: Recommended patient receive covid vaccine booster Patient self care activities - Over the next 90 days, patient will: Receive covid vaccine booster    Medication management Pharmacist Clinical Goal(s): Over the next 90 days, patient will work with PharmD  and providers to maintain optimal medication adherence Current pharmacy: [facility] Comprehensive medication  review performed. Continue current medication management strategy - utilizing daily pill container and watch with alerts / reminder alarms Patient self care activities - Over the next 90 days, patient will: Focus on medication adherence by filling and taking medications appropriately  Take medications as prescribed Report any questions or concerns to PharmD and/or provider(s)  Please see past updates related to this goal by clicking on the "Past Updates" button in the selected goal       COMPLETED: Discuss medication options with urologist       Tamsulosin: might have less side effects than terazosin, but there is some data with terazosin for patients that have Parkinson's Disease  Terazosin: may cause more low blood pressure, but there is limited data that it could help patients with Parkinson's Disease  These are only recommendations for you to discuss with your urologist. Your urologist is more familiar with your history. I'm happy to help any way that I can.         The patient verbalized understanding of instructions, educational materials, and care plan provided today and declined offer to receive copy of patient instructions, educational materials, and care plan.   Follow up 3 months with pharmacy team by phone and 6 months with clinical pharmacist unless needed sooner.

## 2020-09-13 NOTE — Chronic Care Management (AMB) (Signed)
Chronic Care Management Pharmacy Note  09/13/2020 Name:  William Williamson MRN:  [id number] DOB:  April 17, 1932  Summary: Medication adherence appears to be improving with help from son, use of medication containers and reminders set on watch.  PSA slightly elevated. Last checked 04/2020. Last urology visit 01/2019.  Recommendations/Changes made from today's visit: Consider checking PSA yearly - referral back to urology as needed.   Subjective: William Williamson is an 87 y.o. year old male who is a primary patient of [name], DO.  The CCM team was consulted for assistance with disease management and care coordination needs.    Engaged with patient by telephone for follow up visit in response to provider referral for pharmacy case management and/or care coordination services.   Consent to Services:  The patient was given information about Chronic Care Management services, agreed to services, and gave verbal consent prior to initiation of services.  Please see initial visit note for detailed documentation.   Patient Care Team: Carollee Herter, Alferd Apa, DO as PCP - General (Family Medicine) Dorothy Spark, MD (Inactive) as PCP - Cardiology (Cardiology) Allyn Kenner, MD as Consulting Physician (Dermatology) Tat, Eustace Quail, DO as Consulting Physician (Neurology) Festus Aloe, MD as Consulting Physician (Urology) Dorothy Spark, MD (Inactive) as Consulting Physician (Cardiology) Daneen Schick, DDS as Consulting Physician (Dentistry) Cherre Robins, PharmD (Pharmacist)  Recent office visits: 09/01/2020 - PCP (Dr Etter Sjogren) Seen for low back pain. X-ray ordered; prescribed tramadol 50mg  every 8 hours as needed for up to 5 days; Methocarbamol 500mg  4 times a day. Lumbar spine xray findings: Mild facet predominant lumbar spondylosis. No acute findings.  08/12/2020 - PCP (Dr Etter Sjogren) Video visit f/u chronic conditions; no med changes; encouraged adherence to meds and using  walker to prevent fall; declined PT.   05/13/2020 - PCP (Dr Etter Sjogren) - f/u chronic conditions. No med changes  Recent consult visits: 05/11/2020 - Neuro (Dr Tat) f/u parkinson's disease. Parkinsons Disease. Increase carbidopa/levodopa 2 tablets at 7am/11am/3pm and 1 tablet at 7pm (was supposed to do that last time but didn't). Continue carbidopa/levodopa 50/200 CR at bedtime. Reommended PT due to frequent falls but patient declined.   Hospital visits: None in previous 6 months  Objective:  Lab Results  Component Value Date   CREATININE 1.21 04/12/2020   CREATININE 1.20 03/03/2019   CREATININE 1.03 12/30/2018    Lab Results  Component Value Date   HGBA1C 5.9 10/09/2006   Last diabetic Eye exam: No results found for: HMDIABEYEEXA  Last diabetic Foot exam: No results found for: HMDIABFOOTEX      Component Value Date/Time   CHOL 131 04/12/2020 0714   TRIG 63.0 04/12/2020 0714   TRIG 79 01/23/2006 1303   HDL 46.40 04/12/2020 0714   CHOLHDL 3 04/12/2020 0714   VLDL 12.6 04/12/2020 0714   LDLCALC 72 04/12/2020 0714    Hepatic Function Latest Ref Rng & Units 04/12/2020 03/03/2019 12/30/2018  Total Protein 6.0 - 8.3 g/dL 6.8 6.9 6.7  Albumin 3.5 - 5.2 g/dL 4.5 4.5 4.3  AST 0 - 37 U/L 17 20 18   ALT 0 - 53 U/L 11 5 4   Alk Phosphatase 39 - 117 U/L 45 49 48  Total Bilirubin 0.2 - 1.2 mg/dL 0.6 0.8 0.6  Bilirubin, Direct 0.0 - 0.3 mg/dL - - -    Lab Results  Component Value Date/Time   TSH 2.80 01/18/2020 08:19 AM   TSH 16.50 (H) 03/03/2019 01:58 PM   FREET4  0.3 (L) 05/05/2008 11:01 AM   FREET4 3.1 (H) 01/16/2008 11:57 AM    CBC Latest Ref Rng & Units 04/12/2020 03/03/2019 08/22/2017  WBC 4.0 - 10.5 K/uL 7.1 9.9 7.7  Hemoglobin 13.0 - 17.0 g/dL 13.7 14.1 14.4  Hematocrit 39.0 - 52.0 % 41.0 41.8 42.6  Platelets 150.0 - 400.0 K/uL 196.0 204.0 200.0    Lab Results  Component Value Date/Time   VD25OH 43 01/18/2020 08:19 AM   VD25OH 35 06/14/2011 09:38 AM    Clinical ASCVD:  No  The ASCVD Risk score ([place]., et al., 2013) failed to calculate for the following reasons:   The 2013 ASCVD risk score is only valid for ages 84 to 11     Social History   Tobacco Use  Smoking Status Former   Pack years: 0.00   Types: Cigarettes   Quit date: 04/02/1968   Years since quitting: 52.4  Smokeless Tobacco Never   BP Readings from Last 3 Encounters:  09/01/20 (!) 140/110  08/12/20 (!) 117/54  05/13/20 110/70   Pulse Readings from Last 3 Encounters:  09/01/20 88  08/12/20 70  05/13/20 74   Wt Readings from Last 3 Encounters:  09/01/20 173 lb 3.2 oz (78.6 kg)  05/13/20 175 lb (79.4 kg)  05/11/20 176 lb (79.8 kg)    Assessment: Review of patient past medical history, allergies, medications, health status, including review of consultants reports, laboratory and other test data, was performed as part of comprehensive evaluation and provision of chronic care management services.   SDOH:  (Social Determinants of Health) assessments and interventions performed:  SDOH Interventions    Flowsheet Row Most Recent Value  SDOH Interventions   Financial Strain Interventions Intervention Not Indicated  Physical Activity Interventions Intervention Not Indicated  [Patient injured back about 10 days ago. Not able to exercise at this time. Refused PT.]       CCM Care Plan  Allergies  Allergen Reactions   Garlic Nausea Only   Niacin     REACTION: flushing    Medications Reviewed Today     Reviewed by Cherre Robins, PharmD (Pharmacist) on 09/13/20 at [place] List Status: <None>   Medication Order Taking? Sig Documenting Provider Last Dose Status Informant  carbidopa-levodopa (SINEMET CR) 50-200 MG tablet [id number] Yes TAKE 1 TABLET BY MOUTH AT BEDTIME Tat, Eustace Quail, DO Taking Active   carbidopa-levodopa (SINEMET IR) 25-100 MG tablet [id number] Yes 2 tablets at 7am/11am/3pm and 1 tablet at 7pm Tat, Eustace Quail, DO Taking Active   Cholecalciferol 25 MCG (1000 UT)  CHEW [id number] Yes Chew 1 tablet (1,000 Units total) by mouth 2 (two) times daily. [provider] Taking Active   furosemide (LASIX) 20 MG tablet [id number] Yes Take 1 tablet (20 mg total) by mouth daily. Carollee Herter, Kendrick Fries R, DO Taking Active   levothyroxine (SYNTHROID) 100 MCG tablet [id number] Yes TAKE 1 TABLET BY MOUTH ONCE DAILY BEFORE BREAKFAST Roma Schanz R, DO Taking Active   lisinopril (ZESTRIL) 10 MG tablet [id number] Yes Take 1 tablet (10 mg total) by mouth daily. [name], DO Taking Active   methocarbamol (ROBAXIN) 500 MG tablet [id number] Yes Take 1 tablet (500 mg total) by mouth 4 (four) times daily. Roma Schanz R, DO Taking Active   mirabegron ER (MYRBETRIQ) 50 MG TB24 tablet [id number] Yes Take 1 tablet (50 mg total) by mouth daily. Roma Schanz R, DO Taking Active   mirtazapine (REMERON) 7.5 MG tablet [id number]  Yes Take 1 tablet (7.5 mg total) by mouth at bedtime. [name], DO Taking Active   NONFORMULARY OR COMPOUNDED ITEM [id number] Yes Walker  #1   Dx parkinson disease [name], DO Taking Active   nystatin (NYSTATIN) powder [id number] Yes Apply topically 4 (four) times daily. Carollee Herter, Kendrick Fries R, DO Taking Active   simvastatin (ZOCOR) 20 MG tablet [id number] Yes Take 1 tablet (20 mg total) by mouth daily at 6 PM. Carollee Herter, Alferd Apa, DO Taking Active             Patient Active Problem List   Diagnosis Date Noted   Low back pain radiating to left leg 09/01/2020   Frequent falls 08/12/2020   Depression 05/14/2020   Insomnia 04/12/2020   Hallucinations 04/12/2020   Left elbow pain 04/12/2020   Left wrist pain 04/12/2020   Urinary incontinence 04/12/2020   Benign prostatic hyperplasia with urinary frequency 03/03/2019   Tinea corporis 03/03/2019   Elevated BP without diagnosis of hypertension 12/30/2018   Lower extremity edema 09/09/2018   Otalgia 08/26/2018   Preventative health care 08/16/2016    Hypothyroidism 08/16/2016   Prostate enlargement 08/16/2016   Autonomic dysfunction 02/02/2016   Ingrown toenail 05/17/2015   Right thigh pain 12/24/2014   H. pylori infection 06/21/2014   Primary hypertension 02/08/2014   Hyperlipidemia 02/08/2014   Parkinson disease ([place]) 12/21/2013   Hearing loss 10/06/2013   Paralysis agitans ([place]) 07/28/2013   UTI (urinary tract infection) 05/27/2013   Nonspecific abnormal electrocardiogram (ECG) (EKG) 06/14/2011   Anemia, unspecified 06/13/2010   CONSTIPATION, CHRONIC 06/09/2009   Hypothyroidism following radioiodine therapy 07/07/2008   Vitamin D deficiency 01/09/2008   HYPERCHOLESTEROLEMIA 07/10/2007   Arthropathy 07/10/2007   G E R D 10/11/2006    Immunization History  Administered Date(s) Administered   Fluad Quad(high Dose 65+) 12/30/2018   Influenza Split 01/09/2011, 01/15/2012   Influenza Whole 03/11/2007, 01/09/2008, 01/26/2009, 01/23/2010   Influenza, High Dose Seasonal PF 01/22/2013, 12/23/2014, 01/17/2016   Influenza,inj,Quad PF,6+ Mos 12/21/2013   Influenza-Unspecified 12/31/2017, 03/02/2020   PFIZER(Purple Top)SARS-COV-2 Vaccination 04/16/2019, 05/17/2019, 03/02/2020   Pneumococcal Conjugate-13 06/21/2014   Pneumococcal Polysaccharide-23 06/19/2013   Td 07/07/2008   Zoster Recombinat (Shingrix) 12/26/2017, 04/09/2018   Zoster, Live 06/21/2014    Conditions to be addressed/monitored: HTN, HLD, and Parkinson's disease; BPH with LUTS / OAB; frequent falls; hypothyroidism; elevated PSA  Care Plan : General Pharmacy (Adult)  Updates made by Cherre Robins, PHARMD since 09/13/2020 12:00 AM     Problem: CHL AMB "PATIENT-SPECIFIC PROBLEM"      Long-Range Goal: Patient-Specific Goal   Start Date: 09/13/2020  This Visit's Progress: On track  Priority: High  Note:   Current Barriers:  Unable to independently monitor therapeutic efficacy Unable to self administer medications as prescribed Chronic Disease Management support,  education, and care coordination needs related to HTN, HLD, Hypothyroidism, Parkinson Disease, Vitamin D Deficiency, BPH, Lower Extremity Edema, Tinea Corporis, Glaucoma, Constipation  Pharmacist Clinical Goal(s):  Over the next 180 days, patient will maintain control of HTN, hyperlipidemia as evidenced by attaining goals listed below  adhere to prescribed medication regimen as evidenced by fill history contact provider office for questions/concerns as evidenced notation of same in electronic health record through collaboration with PharmD and provider.   Interventions: 1:1 collaboration with Carollee Herter, Alferd Apa, DO regarding development and update of comprehensive plan of care as evidenced by provider attestation and co-signature Inter-disciplinary care team collaboration (see longitudinal plan of care)  Comprehensive medication review performed; medication list updated in electronic medical record   Hypertension Variable control per last OV BP readings; BP goal <140/90 Current regimen:  Lisinopril $RemoveBef'10mg'DDmwQplHQH$  daily Interventions: Requested patient to check his blood pressure once a week and if experiences symptoms of low blood pressure  Monitor for symptoms of low blood pressure (dizziness, weakness) and report to provider (more likely to have orthostatic hypotension due to Parkinson's Disease. Ensure daily salt intake < 2300 mg/day  Hyperlipidemia Lab Results  Component Value Date/Time   LDLCALC 72 04/12/2020 07:14 AM  Controlled; maintain LDL goal < 100 Current regimen:  Simvastatin $RemoveBefo'20mg'JmsNDGUgfOP$  daily Interventions (addressed at previous visits)  Maintain cholesterol medication regimen.   Hypothyroidism Goals: reduce symptoms associated with hypothyroidism and maintain TSH / T3 and T4 - within normal limits Last thyroid panel 01/18/2020: TSH = 2.80 (WNL) T3 uptake = 37 (elevated)  T4 = 6.1 (WNL) Current regimen:  Levothyroxine 133mcg daily Interventions:  Reminded to take levothyroxine  daily in morning on empty stomach at least 30 minutes prior to other meds.  Recheck thyroid panel with next labs  Parkinson's Disease:  Goal: decrease symptoms of Parkinson' disease while minimizing side effects.  Current regimen:  Carbidopa / Levodopa CR 50/$RemoveBefore'200mg'LnYfPJMYltfVK$  take 1 tablet at bedtime Carbidopa / levodopa IR 25/100 - take 2 tablets at 7am; 11am and 3pm; take 1 tablet at 7pm  Interventions: Discussed benefits of current therapy for Parkinson's  Discussed potential side effects to monitor Continue to follow up with Dr Tat.  Continue current regimen for treatment of Parkinson's Disease  Elevated PSA / BPH with urinary frequency at night: Goal: decrease frequency of night time bathroom trips and monitor PSA Patient reports prior to myrbetriq he was going to bathroom 4 to 5 times per night; Now only 1 or 2 times per night  Lab Results  Component Value Date   PSA 4.99 (H) 04/12/2020   PSA 4.85 (H) 08/16/2016   PSA 4.01 (H) 06/23/2015  Current regimen:  Myrbetriq ER $RemoveBefo'50mg'KJFLsHRmxDN$  daily Patient self care activities - Over the next 90 days, patient will: Continue current therapy for urinary frequency Check PSA yearly; refer to urology as needed.    Health Maintenance  Interventions: Recommended patient receive covid vaccine booster    Medication management Current pharmacy: [facility] Interventions Comprehensive medication review performed. Continue current medication management strategy - utilizing daily pill container and watch with alerts / reminder alarms  Patient Goals/Self-Care Activities Over the next 90 days, patient will:  take medications as prescribed and check blood pressure weekly and if experience symptoms of low BP, document, and provide at future appointments  Follow Up Plan:  pharmacy team will check in with patient in 3 months; Clinical pharmacist in 6 months unless needed sooner.         Medication Assistance: None required.  Patient affirms  current coverage meets needs.  Patient's preferred pharmacy is:  [facility] - [place], [place] - [address] [place] [address] Phone: [phone] Fax: [phone]  [facility] - [place], [place] - [address] [place] [address] Phone: [phone] Fax: [phone]  Uses pill box? Yes and also has alarms set on watch for carbidopa / levodopa Pt endorses 90% compliance  Follow Up:  Patient agrees to Care Plan and Follow-up.  Plan:  3 month - pharmacy team will call; 6 months with clinical pharmacist unless needed sooner.  Cherre Robins, PharmD Clinical Pharmacist [facility] [phone]

## 2020-09-29 ENCOUNTER — Other Ambulatory Visit: Payer: Self-pay | Admitting: Family Medicine

## 2020-09-29 DIAGNOSIS — M545 Low back pain, unspecified: Secondary | ICD-10-CM

## 2020-10-14 ENCOUNTER — Encounter: Payer: Self-pay | Admitting: Internal Medicine

## 2020-10-14 ENCOUNTER — Ambulatory Visit (INDEPENDENT_AMBULATORY_CARE_PROVIDER_SITE_OTHER): Payer: PPO | Admitting: Internal Medicine

## 2020-10-14 ENCOUNTER — Other Ambulatory Visit: Payer: Self-pay

## 2020-10-14 VITALS — BP 136/80 | HR 60 | Temp 98.0°F | Resp 16 | Ht 73.0 in

## 2020-10-14 DIAGNOSIS — M25552 Pain in left hip: Secondary | ICD-10-CM | POA: Diagnosis not present

## 2020-10-14 DIAGNOSIS — M545 Low back pain, unspecified: Secondary | ICD-10-CM

## 2020-10-14 NOTE — Progress Notes (Signed)
Subjective:    Patient ID: William Williamson, male    DOB: 09-Mar-1933, 87 y.o.   MRN: [id number]  DOS:  10/14/2020 Type of visit - description: Acute, here w/ his son   Was seen 09/01/2020 with back pain with radiation to the left leg. PCP ordered x-ray of the back which showed no acute changes, prescribed tramadol which did not help too much. Prescribed methocarbamol which helped. The patient is also taking ibuprofen.  History taking is somewhat challenging but to the best of my ability this is what he reports:  Continue with pain, mostly from the left knee down. Also at the hips when he attempts to walk at home, L>R. Back pain minimal today At home he uses rolling walker. I ask about falls and he said nothing recent but the son reports that he had several in the last few months.  Review of Systems No fever chills No weight loss No headaches No visual disturbances  Past Medical History:  Diagnosis Date   Anemia 1952   post Oromycin for [place]   Anisocoria    post op   Arthritis    Autonomic dysfunction    GERD (gastroesophageal reflux disease)    Glaucoma    Dr Bing Plume   Hearing loss in right ear    Hypercholesteremia    Hyperthyroidism    s/p RAI   LVH (left ventricular hypertrophy)    Orthostatic hypotension    Parkinson's disease [facility])     Past Surgical History:  Procedure Laterality Date   CARDIAC CATHETERIZATION  07/2006   Dr.  Eustace Quail, negative   CATARACT EXTRACTION, BILATERAL     OS retinal surgery for post op floaters   COLONOSCOPY     negative X 3   RAI ablation  02/2008   hyperthyroidism   SEPTOPLASTY  1970   VASECTOMY      Allergies as of 10/14/2020       Reactions   Garlic Nausea Only   Niacin    REACTION: flushing        Medication List        Accurate as of October 14, 2020  8:41 AM. If you have any questions, ask your nurse or doctor.          carbidopa-levodopa 25-100 MG tablet Commonly known as: SINEMET IR 2  tablets at 7am/11am/3pm and 1 tablet at 7pm   carbidopa-levodopa 50-200 MG tablet Commonly known as: SINEMET CR TAKE 1 TABLET BY MOUTH AT BEDTIME   Cholecalciferol 25 MCG (1000 UT) Chew Chew 1 tablet (1,000 Units total) by mouth 2 (two) times daily.   furosemide 20 MG tablet Commonly known as: LASIX Take 1 tablet (20 mg total) by mouth daily.   levothyroxine 100 MCG tablet Commonly known as: SYNTHROID TAKE 1 TABLET BY MOUTH ONCE DAILY BEFORE BREAKFAST   lisinopril 10 MG tablet Commonly known as: ZESTRIL Take 1 tablet (10 mg total) by mouth daily.   methocarbamol 500 MG tablet Commonly known as: ROBAXIN Take 1 tablet by mouth 4 times daily   mirabegron ER 50 MG Tb24 tablet Commonly known as: Myrbetriq Take 1 tablet (50 mg total) by mouth daily.   mirtazapine 7.5 MG tablet Commonly known as: REMERON Take 1 tablet (7.5 mg total) by mouth at bedtime.   NONFORMULARY OR COMPOUNDED ITEM Walker  #1   Dx parkinson disease   nystatin powder Commonly known as: nystatin Apply topically 4 (four) times daily.   simvastatin 20 MG tablet Commonly  known as: ZOCOR Take 1 tablet (20 mg total) by mouth daily at 6 PM.           Objective:   Physical Exam BP 136/80 (BP Location: Right Arm, Patient Position: Sitting, Cuff Size: Small)   Pulse 60   Temp 98 F (36.7 C) (Oral)   Resp 16   Ht 6\' 1"  (1.854 m)   SpO2 93%   BMI 22.85 kg/m  General:   Well developed, frail elderly gentleman, sitting in the wheelchair.  We did not attempt to wake him as he was unsteady. HEENT:  Normocephalic . Face symmetric, atraumatic Palpation of the temporal artery area: No pain, good pulses. Lower extremities: no pretibial edema bilaterally, good pedal pulses bilaterally, good perfusion.  Calves symmetric and nontender MSK: No TTP of the lumbar spine. Knees: No effusion, some deformities consistent with DJD. Hips: While the patient was sitting in the chair, I mobilized his hips without  apparent pain triggering. Skin: Not pale. Not jaundice Neurologic:  alert & oriented X3.  Speech slow, consistent with Parkinson's, gait: sits on a wheelchair, not tested.   Psych--  Cognition and judgment appear intact.  Cooperative with normal attention span and concentration.  Behavior appropriate. No anxious or depressed appearing.      Assessment     87 year old gentleman, PMH includes HTN, hypothyroidism, Parkinson, high cholesterol, presents with  Hips, L leg and some back pain: Previously seen by PCP, back x-ray nonacute, tramadol did not help, methocarbamol helped. No systemic symptoms, suspect this is a mechanical issue, likely DJD of the back and hips. Plan: Pain control with Tylenol and occasional ibuprofen with GI precautions, see AVS. Methocarbamol as needed, call for a refill prn Ortho referral Patient was hesitant to obtain a hip x-ray today.   This visit occurred during the SARS-CoV-2 public health emergency.  Safety protocols were in place, including screening questions prior to the visit, additional usage of staff PPE, and extensive cleaning of exam room while observing appropriate contact time as indicated for disinfecting solutions.

## 2020-10-14 NOTE — Patient Instructions (Addendum)
Tylenol  500 mg OTC 2 tabs a day every 8 hours as needed for pain  IBUPROFEN (Advil or Motrin) 200 mg 1-2 tablets once a day if needed Always take it with food because may cause gastritis and ulcers.  If you notice nausea, stomach pain, change in the color of stools --->  Stop the medicine and let us know  Continue Robaxin, the muscle relaxant   Will refer you to orthopedic surgery

## 2020-10-16 ENCOUNTER — Other Ambulatory Visit: Payer: Self-pay | Admitting: Neurology

## 2020-11-02 DIAGNOSIS — M25552 Pain in left hip: Secondary | ICD-10-CM | POA: Diagnosis not present

## 2020-11-08 ENCOUNTER — Other Ambulatory Visit: Payer: Self-pay | Admitting: Neurology

## 2020-11-11 DIAGNOSIS — Z961 Presence of intraocular lens: Secondary | ICD-10-CM | POA: Diagnosis not present

## 2020-11-16 ENCOUNTER — Other Ambulatory Visit: Payer: Self-pay | Admitting: Neurology

## 2020-11-16 NOTE — Progress Notes (Signed)
Assessment/Plan:   1.  Parkinsons Disease  -Continue carbidopa/levodopa 2 tablets at 7am/11am/3pm and 1 tablet at 7pm.  Discussed various pillboxes.  -Continue carbidopa/levodopa 50/200 CR at bedtime   -proper taking of medication is an issue here.  Son agrees compliance an issue  -multiple falls.  Needs PT.  Discussed purpose in detail.  Declined by patient.  This has been the case the last few visits.  He can let me know if he changes his mind.  That being said, his insurance is very difficult to get home physical therapy with.   2.  Sialorrhea  -He had one series of Myobloc injections on October 01, 2019 and no showed the next injection.  He has not had injections since.  He thought that it caused dry mouth.  He does not wish to retry, even with lowering the dose.  3.  Insomnia  -On mirtazapine by primary care.  4.  Hypertension  -On lisinopril.  He has had blood pressure fluctuations, but was high in primary care office when he did not take his medication.  We will continue to monitor.  5.  REM behavior disorder  -This is commonly associated with PD and the patient is experiencing this.  We discussed that this can be very serious and even harmful.  We talked about medications as well as physical barriers to put in the bed (particularly soft bed rails, pillow barriers).  We talked about moving the night stand so that it is not so close to the side of the bed.  Subjective:   William Williamson was seen today in follow up for Parkinsons disease.  My previous records were reviewed prior to todays visit as well as outside records available to me.  Patient accompanied by his son who supplements the history.  We slightly increased his levodopa last visit. He is not sure that made a difference but has not had falls.  We discussed the importance of compliance last visit and they report today that he is taking his meds.  When asked about the timing of the medication, it is somewhat unclear.  No  tremor in day.  Has some tremor in the middle of the night.  I asked him if that prevented him from going back to sleep and he told me he did not know.Marland Kitchen  He was seen by primary care July 15 for hip, left leg and back pain.  Referral to orthopedics was made.  Patient was seen by the PA at [facility] on August 3.  Notes are not available.  Just finished medrol dose pak and that really helped.    Current prescribed movement disorder medications: Carbidopa/levodopa 25/100, 2 tablets at 7am/11am/3pm and 1 tablet at 7pm (changed last visit after patient forgot to make the change the visit before) Carbidopa/levodopa 50/200 at bedtime.  ALLERGIES:   Allergies  Allergen Reactions   Garlic Nausea Only   Niacin     REACTION: flushing    CURRENT MEDICATIONS:  Outpatient Encounter Medications as of 11/18/2020  Medication Sig   carbidopa-levodopa (SINEMET CR) 50-200 MG tablet TAKE 1 TABLET BY MOUTH AT BEDTIME   carbidopa-levodopa (SINEMET IR) 25-100 MG tablet 2 tablets at 7am/11am/3pm and 1 tablet at 7pm   Cholecalciferol 25 MCG (1000 UT) CHEW Chew 1 tablet (1,000 Units total) by mouth 2 (two) times daily.   furosemide (LASIX) 20 MG tablet Take 1 tablet (20 mg total) by mouth daily.   levothyroxine (SYNTHROID) 100 MCG tablet TAKE 1  TABLET BY MOUTH ONCE DAILY BEFORE BREAKFAST   lisinopril (ZESTRIL) 10 MG tablet Take 1 tablet (10 mg total) by mouth daily.   methocarbamol (ROBAXIN) 500 MG tablet Take 1 tablet by mouth 4 times daily   mirabegron ER (MYRBETRIQ) 50 MG TB24 tablet Take 1 tablet (50 mg total) by mouth daily.   mirtazapine (REMERON) 7.5 MG tablet Take 1 tablet (7.5 mg total) by mouth at bedtime.   NONFORMULARY OR COMPOUNDED ITEM Walker  #1   Dx parkinson disease   nystatin (NYSTATIN) powder Apply topically 4 (four) times daily.   simvastatin (ZOCOR) 20 MG tablet Take 1 tablet (20 mg total) by mouth daily at 6 PM.   No facility-administered encounter medications on file as of 11/18/2020.     Objective:   PHYSICAL EXAMINATION:    VITALS:   Vitals:   11/18/20 0817  BP: (!) 123/58  Pulse: 69  SpO2: 96%  Weight: 171 lb 3.2 oz (77.7 kg)  Height: '6\' 1"'$  (1.854 m)     GEN:  The patient appears stated age and is in NAD. HEENT:  Normocephalic, atraumatic.  The mucous membranes are moist. The superficial temporal arteries are without ropiness or tenderness. CV:  RRR Lungs:  CTAB Neck/HEME:  There are no carotid bruits bilaterally.  Neurological examination:  Orientation: The patient is alert and oriented x3. Cranial nerves: There is good facial symmetry with significant facial hypomimia. The speech is fluent and hypophonic. Soft palate rises symmetrically and there is no tongue deviation. Hearing is intact to conversational tone. Sensation: Sensation is intact to light touch throughout Motor: Strength is at least antigravity x4.  Movement examination: Tone: There is normal tone in the UE/LE Abnormal movements:  he has mild truncal dyskinesia Coordination:  There is decremation with RAM's, esp with toe taps on the L Gait and Station: The patient has  difficulty arising out of a deep-seated chair without the use of the hands and requires assist, but minimally so. 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   He has trouble in the turns but is able to do them without assistance.  Knees are flexed and mildly unstable with ambulation.  I have reviewed and interpreted the following labs independently    Chemistry      Component Value Date/Time   NA 138 04/12/2020 0714   K 4.2 04/12/2020 0714   CL 103 04/12/2020 0714   CO2 30 04/12/2020 0714   BUN 23 04/12/2020 0714   CREATININE 1.21 04/12/2020 0714      Component Value Date/Time   CALCIUM 9.3 04/12/2020 0714   ALKPHOS 45 04/12/2020 0714   AST 17 04/12/2020 0714   ALT 11 04/12/2020 0714   BILITOT 0.6 04/12/2020 0714       Lab Results  Component Value Date   WBC 7.1 04/12/2020   HGB 13.7 04/12/2020   HCT 41.0  04/12/2020   MCV 95.8 04/12/2020   PLT 196.0 04/12/2020    Lab Results  Component Value Date   TSH 2.80 01/18/2020     Total time spent on today's visit was 24 minutes, including both face-to-face time and nonface-to-face time.  Time included that spent on review of records (prior notes available to me/labs/imaging if pertinent), discussing treatment and goals, answering patient's questions and coordinating care.  Cc:  [name], DO

## 2020-11-18 ENCOUNTER — Other Ambulatory Visit: Payer: Self-pay

## 2020-11-18 ENCOUNTER — Encounter: Payer: Self-pay | Admitting: Neurology

## 2020-11-18 ENCOUNTER — Ambulatory Visit: Payer: PPO | Admitting: Neurology

## 2020-11-18 VITALS — BP 123/58 | HR 69 | Ht 73.0 in | Wt 171.2 lb

## 2020-11-18 DIAGNOSIS — G20 Parkinson's disease: Secondary | ICD-10-CM | POA: Diagnosis not present

## 2020-11-18 NOTE — Patient Instructions (Signed)
Let us know if you want physical therapy, which I recommend  The physicians and staff at [facility] are committed to providing excellent care. You may receive a survey requesting feedback about your experience at our office. We strive to receive "very good" responses to the survey questions. If you feel that your experience would prevent you from giving the office a "very good " response, please contact our office to try to remedy the situation. We may be reached at [phone]. Thank you for taking the time out of your busy day to complete the survey.

## 2020-11-24 ENCOUNTER — Other Ambulatory Visit: Payer: Self-pay | Admitting: Neurology

## 2020-11-25 ENCOUNTER — Other Ambulatory Visit: Payer: Self-pay

## 2020-11-25 ENCOUNTER — Telehealth: Payer: Self-pay | Admitting: Neurology

## 2020-11-25 DIAGNOSIS — H401131 Primary open-angle glaucoma, bilateral, mild stage: Secondary | ICD-10-CM | POA: Diagnosis not present

## 2020-11-25 MED ORDER — CARBIDOPA-LEVODOPA 25-100 MG PO TABS: ORAL_TABLET | ORAL | 0 refills | Status: DC

## 2020-11-25 MED ORDER — CARBIDOPA-LEVODOPA ER 50-200 MG PO TBCR: 1.0000 | EXTENDED_RELEASE_TABLET | Freq: Every day | ORAL | 0 refills | Status: DC

## 2020-11-25 NOTE — Telephone Encounter (Signed)
Pt called and informed that both carbidopa levodopa's was called into [facility] on [address]

## 2020-11-25 NOTE — Telephone Encounter (Signed)
Patient states that he had appt with Tat on 11-18-20. He states that he has been trying to get the carbidopa levodopa refilled for 3 weeks now. He is out of medication. He states that our office has denied the request several time according to what he has been told. He would like to get this refilled since he is out of medication   He needs both of the types of Carbidopa levodopa refilled and he would like a 90 day supply for both   [facility] on [address]    Please call patient and let him know why we will not refill medication

## 2020-12-08 DIAGNOSIS — H401131 Primary open-angle glaucoma, bilateral, mild stage: Secondary | ICD-10-CM | POA: Diagnosis not present

## 2021-01-19 ENCOUNTER — Ambulatory Visit: Payer: PPO

## 2021-01-19 ENCOUNTER — Other Ambulatory Visit: Payer: Self-pay | Admitting: Family Medicine

## 2021-01-19 DIAGNOSIS — E785 Hyperlipidemia, unspecified: Secondary | ICD-10-CM

## 2021-01-19 NOTE — Progress Notes (Signed)
Subjective:   William Williamson is a 87 y.o. male who presents for Medicare Annual/Subsequent preventive examination.  I connected with Rhoderick today by telephone and verified that I am speaking with the correct person using two identifiers. Location patient: home Location provider: work Persons participating in the virtual visit: patient, Marine scientist.    I discussed the limitations, risks, security and privacy concerns of performing an evaluation and management service by telephone and the availability of in person appointments. I also discussed with the patient that there may be a patient responsible charge related to this service. The patient expressed understanding and verbally consented to this telephonic visit.    Interactive audio and video telecommunications were attempted between this provider and patient, however failed, due to patient having technical difficulties OR patient did not have access to video capability.  We continued and completed visit with audio only.  Some vital signs may be absent or patient reported.   Time Spent with patient on telephone encounter: 30 minutes   Review of Systems     Cardiac Risk Factors include: advanced age (>43men, >66 women);male gender;dyslipidemia;sedentary lifestyle     Objective:    Today's Vitals   01/23/21 1142  Weight: 171 lb (77.6 kg)  Height: 6\' 1"  (1.854 m)  PainSc: 2    Body mass index is 22.56 kg/m.  Advanced Directives 01/23/2021 11/18/2020 05/11/2020 10/14/2019 08/19/2017 08/16/2016 06/23/2015  Does Patient Have a Medical Advance Directive? Yes No;Yes Yes Yes Yes Yes Yes  Type of Advance Directive Living will;Healthcare Power of Attorney Living will [place];Living will - Healthcare Power of [place]  Does patient want to make changes to medical advance directive? - - - - - No - Patient declined No - Patient declined  Copy of Gem in Chart? No - copy requested - - - Yes No - copy requested No - copy requested    Current Medications (verified) Outpatient Encounter Medications as of 01/23/2021  Medication Sig   carbidopa-levodopa (SINEMET CR) 50-200 MG tablet Take 1 tablet by mouth at bedtime.   carbidopa-levodopa (SINEMET IR) 25-100 MG tablet 2 tablets at 7am/11am/3pm and 1 tablet at 7pm   Cholecalciferol 25 MCG (1000 UT) CHEW Chew 1 tablet (1,000 Units total) by mouth 2 (two) times daily.   furosemide (LASIX) 20 MG tablet Take 1 tablet (20 mg total) by mouth daily.   levothyroxine (SYNTHROID) 100 MCG tablet TAKE 1 TABLET BY MOUTH ONCE DAILY BEFORE BREAKFAST   lisinopril (ZESTRIL) 10 MG tablet Take 1 tablet (10 mg total) by mouth daily.   methocarbamol (ROBAXIN) 500 MG tablet Take 1 tablet by mouth 4 times daily   mirabegron ER (MYRBETRIQ) 50 MG TB24 tablet Take 1 tablet (50 mg total) by mouth daily.   mirtazapine (REMERON) 7.5 MG tablet Take 1 tablet (7.5 mg total) by mouth at bedtime.   NONFORMULARY OR COMPOUNDED ITEM Walker  #1   Dx parkinson disease   nystatin (NYSTATIN) powder Apply topically 4 (four) times daily.   simvastatin (ZOCOR) 20 MG tablet TAKE 1 TABLET BY MOUTH ONCE DAILY AT 6 PM.   [DISCONTINUED] simvastatin (ZOCOR) 20 MG tablet Take 1 tablet (20 mg total) by mouth daily at 6 PM.   No facility-administered encounter medications on file as of 01/23/2021.    Allergies (verified) Garlic and Niacin   History: Past Medical History:  Diagnosis Date   Anemia 1952   post Oromycin for  Tularemia   Anisocoria    post op   Arthritis    Autonomic dysfunction    GERD (gastroesophageal reflux disease)    Glaucoma    Dr Bing Plume   Hearing loss in right ear    Hypercholesteremia    Hyperthyroidism    s/p RAI   LVH (left ventricular hypertrophy)    Orthostatic hypotension    Parkinson's disease [facility])    Past Surgical History:  Procedure Laterality Date   CARDIAC CATHETERIZATION  07/2006   Dr.   Eustace Quail, negative   CATARACT EXTRACTION, BILATERAL     OS retinal surgery for post op floaters   COLONOSCOPY     negative X 3   RAI ablation  02/2008   hyperthyroidism   SEPTOPLASTY  1970   VASECTOMY     Family History  Problem Relation Age of Onset   Osteoporosis Mother    Hypertension Sister    Hypertension Brother         X 3   Diabetes Brother    Prostate cancer Brother    Stomach cancer Maternal Grandmother    Pancreatic cancer Brother        ???   Heart attack Maternal Grandfather 62   Stroke Neg Hx    Social History   Socioeconomic History   Marital status: Widowed    Spouse name: Not on file   Number of children: Not on file   Years of education: Not on file   Highest education level: Not on file  Occupational History   Occupation: Retired    Comment: personnel  Tobacco Use   Smoking status: Former    Types: Cigarettes    Quit date: 04/02/1968    Years since quitting: 52.8   Smokeless tobacco: Never  Vaping Use   Vaping Use: Never used  Substance and Sexual Activity   Alcohol use: Yes    Alcohol/week: 6.0 standard drinks    Types: 6 Cans of beer per week   Drug use: No   Sexual activity: Not on file  Other Topics Concern   Not on file  Social History Narrative   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   Social Determinants of Health   Financial Resource Strain: Low Risk    Difficulty of Paying Living Expenses: Not hard at all  Food Insecurity: No Food Insecurity   Worried About Charity fundraiser in the Last Year: Never true   Arboriculturist in the Last Year: Never true  Transportation Needs: No Transportation Needs   Lack of Transportation (Medical): No   Lack of Transportation (Non-Medical): No  Physical Activity: Insufficiently Active   Days of Exercise per Week: 3 days   Minutes of Exercise per Session: 20 min  Stress: No Stress Concern Present   Feeling of Stress : Not at all  Social Connections: Socially Isolated   Frequency of  Communication with Friends and Family: Once a week   Frequency of Social Gatherings with Friends and Family: Once a week   Attends Religious Services: Never   Marine scientist or Organizations: No   Attends Archivist Meetings: Never   Marital Status: Widowed    Tobacco Counseling Counseling given: Not Answered   Clinical Intake:  Pre-visit preparation completed: Yes  Pain : 0-10 Pain Score: 2  Pain Type: Acute pain Pain Orientation: Right Pain Onset: In the past 7 days Pain Frequency: Intermittent     BMI - recorded: 22.56 Nutritional Status: BMI  of 19-24  Normal Nutritional Risks: None Diabetes: No  How often do you need to have someone help you when you read instructions, pamphlets, or other written materials from your doctor or pharmacy?: 1 - Never  Diabetic?No  Interpreter Needed?: No  Information entered by :: Caroleen Hamman LPN   Activities of Daily Living In your present state of health, do you have any difficulty performing the following activities: 01/23/2021 04/11/2020  Hearing? Tempie Donning  Vision? N Y  Difficulty concentrating or making decisions? N Y  Walking or climbing stairs? N Y  Dressing or bathing? N N  Doing errands, shopping? Tempie Donning  Preparing Food and eating ? Y -  Using the Toilet? N -  In the past six months, have you accidently leaked urine? Y -  Comment occasionally -  Do you have problems with loss of bowel control? N -  Managing your Medications? Y -  Managing your Finances? Y -  Housekeeping or managing your Housekeeping? Y -  Some recent data might be hidden    Patient Care Team: Carollee Herter, Alferd Apa, DO as PCP - General (Family Medicine) Dorothy Spark, MD (Inactive) as PCP - Cardiology (Cardiology) Allyn Kenner, MD as Consulting Physician (Dermatology) Tat, Eustace Quail, DO as Consulting Physician (Neurology) Festus Aloe, MD as Consulting Physician (Urology) Dorothy Spark, MD (Inactive) as Consulting  Physician (Cardiology) Daneen Schick, DDS as Consulting Physician (Dentistry) Cherre Robins, PharmD (Pharmacist)  Indicate any recent Medical Services you may have received from other than [facility] providers in the past year (date may be approximate).     Assessment:   This is a routine wellness examination for [employer].  Hearing/Vision screen Hearing Screening - Comments:: Hearing aid right ear Vision Screening - Comments:: Last eye exam-12/2020-[facility]  Dietary issues and exercise activities discussed: Current Exercise Habits: The patient does not participate in regular exercise at present, Exercise limited by: neurologic condition(s) (Parkinson's)   Goals Addressed             This Visit's Progress    Patient Stated       Maintain current health       Depression Screen PHQ 2/9 Scores 01/23/2021 10/14/2020 03/09/2020 08/22/2017 08/19/2017 08/16/2016 01/17/2016  PHQ - 2 Score 0 1 1 0 0 0 0  PHQ- 9 Score - 4 - - - - -  Exception Documentation - - - - - - Patient refusal    Fall Risk Fall Risk  01/23/2021 11/18/2020 10/14/2020 09/13/2020 09/01/2020  Falls in the past year? 1 0 1 1 1   Number falls in past yr: 1 0 0 1 1  Injury with Fall? 1 0 0 0 0  Risk Factor Category  - - - - -  Risk for fall due to : History of fall(s);Impaired balance/gait - - History of fall(s);Impaired balance/gait;Other (Comment) Impaired balance/gait;Impaired mobility  Risk for fall due to: Comment - - - Parkinson's Disease -  Follow up Falls prevention discussed - Falls evaluation completed Falls evaluation completed;Education provided Falls evaluation completed  Comment - - - Patient using walker now. -    FALL RISK PREVENTION PERTAINING TO THE HOME:  Any stairs in or around the home? Yes  If so, are there any without handrails? No  Home free of loose throw rugs in walkways, pet beds, electrical cords, etc? Yes  Adequate lighting in your home to reduce risk of falls? Yes   ASSISTIVE DEVICES  UTILIZED TO PREVENT FALLS:  Life alert?  No  Use of a cane, walker or w/c? Yes  Grab bars in the bathroom? No  Shower chair or bench in shower? Yes  Elevated toilet seat or a handicapped toilet? No   TIMED UP AND GO:  Was the test performed? No . Phone visit   Cognitive Function:Patient states he has had some memory changes. He currently sees neurology & will discuss with them at his next office visit. MMSE - Mini Mental State Exam 08/19/2017 08/16/2016  Orientation to time 5 5  Orientation to Place 5 5  Registration 3 3  Attention/ Calculation 5 5  Recall 3 3  Language- name 2 objects 2 2  Language- repeat 1 1  Language- follow 3 step command 3 3  Language- read & follow direction 1 1  Write a sentence 1 1  Copy design 1 1  Total score 30 30        Immunizations Immunization History  Administered Date(s) Administered   Fluad Quad(high Dose 65+) 12/30/2018   Influenza Split 01/09/2011, 01/15/2012   Influenza Whole 03/11/2007, 01/09/2008, 01/26/2009, 01/23/2010   Influenza, High Dose Seasonal PF 01/22/2013, 12/23/2014, 01/17/2016   Influenza,inj,Quad PF,6+ Mos 12/21/2013   Influenza-Unspecified 12/31/2017, 03/02/2020   PFIZER(Purple Top)SARS-COV-2 Vaccination 04/16/2019, 05/17/2019, 03/02/2020   Pneumococcal Conjugate-13 06/21/2014   Pneumococcal Polysaccharide-23 06/19/2013   Td 07/07/2008   Zoster Recombinat (Shingrix) 12/26/2017, 04/09/2018   Zoster, Live 06/21/2014    TDAP status: Due, Education has been provided regarding the importance of this vaccine. Advised may receive this vaccine at local pharmacy or Health Dept. Aware to provide a copy of the vaccination record if obtained from local pharmacy or Health Dept. Verbalized acceptance and understanding.  Flu Vaccine status: Up to date  Pneumococcal vaccine status: Up to date  Covid-19 vaccine status: Information provided on how to obtain vaccines.   Qualifies for Shingles Vaccine? No   Zostavax completed  Yes   Shingrix Completed?: Yes  Screening Tests Health Maintenance  Topic Date Due   TETANUS/TDAP  07/08/2018   COVID-19 Vaccine (4 - Booster for Pfizer series) 04/27/2020   INFLUENZA VACCINE  10/31/2020   Pneumonia Vaccine 85+ Years old  Completed   Zoster Vaccines- Shingrix  Completed   HPV VACCINES  Aged Out    Health Maintenance  Health Maintenance Due  Topic Date Due   TETANUS/TDAP  07/08/2018   COVID-19 Vaccine (4 - Booster for Pfizer series) 04/27/2020   INFLUENZA VACCINE  10/31/2020    Colorectal cancer screening: No longer required.   Lung Cancer Screening: (Low Dose CT Chest recommended if Age 29-80 years, 30 pack-year currently smoking OR have quit w/in 15years.) does not qualify.     Additional Screening:  Hepatitis C Screening: does not qualify  Vision Screening: Recommended annual ophthalmology exams for early detection of glaucoma and other disorders of the eye. Is the patient up to date with their annual eye exam?  Yes  Who is the provider or what is the name of the office in which the patient attends annual eye exams? Unsure of name   Dental Screening: Recommended annual dental exams for proper oral hygiene  Community Resource Referral / Chronic Care Management: CRR required this visit?  Yes For caregiver assisatnce  CCM required this visit?  No      Plan:     I have personally reviewed and noted the following in the patient's chart:   Medical and social history Use of alcohol, tobacco or illicit drugs  Current medications and supplements  including opioid prescriptions. Patient is not currently taking opioid prescriptions. Functional ability and status Nutritional status Physical activity Advanced directives List of other physicians Hospitalizations, surgeries, and ER visits in previous 12 months Vitals Screenings to include cognitive, depression, and falls Referrals and appointments  In addition, I have reviewed and discussed with  patient certain preventive protocols, quality metrics, and best practice recommendations. A written personalized care plan for preventive services as well as general preventive health recommendations were provided to patient.   Due to this being a telephonic visit, the after visit summary with patients personalized plan was offered to patient via mail or my-chart. Patient would like to access on my-chart.   Marta Antu, LPN   [id number]  Nurse health Advisor  Nurse Notes: None

## 2021-01-23 ENCOUNTER — Ambulatory Visit (INDEPENDENT_AMBULATORY_CARE_PROVIDER_SITE_OTHER): Payer: PPO

## 2021-01-23 VITALS — Ht 73.0 in | Wt 171.0 lb

## 2021-01-23 DIAGNOSIS — Z0000 Encounter for general adult medical examination without abnormal findings: Secondary | ICD-10-CM

## 2021-01-23 NOTE — Patient Instructions (Signed)
Mr. William Williamson , Thank you for taking time to complete your Medicare Wellness Visit. I appreciate your ongoing commitment to your health goals. Please review the following plan we discussed and let me know if I can assist you in the future.   Screening recommendations/referrals: Colonoscopy: No longer required Recommended yearly ophthalmology/optometry visit for glaucoma screening and checkup Recommended yearly dental visit for hygiene and checkup  Vaccinations: Influenza vaccine: Up to date Pneumococcal vaccine: Up to date Tdap vaccine: Discuss with pharmacy Shingles vaccine: Completed vaccines   Covid-19: Booster available at the pharmacy  Advanced directives: Please bring a copy of Living Will and/or Healthcare Power of Attorney for your chart.   Conditions/risks identified: See problem lis  Next appointment: Follow up in one year for your annual wellness visit.   Preventive Care 28 Years and Older, Male Preventive care refers to lifestyle choices and visits with your health care provider that can promote health and wellness. What does preventive care include? A yearly physical exam. This is also called an annual well check. Dental exams once or twice a year. Routine eye exams. Ask your health care provider how often you should have your eyes checked. Personal lifestyle choices, including: Daily care of your teeth and gums. Regular physical activity. Eating a healthy diet. Avoiding tobacco and drug use. Limiting alcohol use. Practicing safe sex. Taking low doses of aspirin every day. Taking vitamin and mineral supplements as recommended by your health care provider. What happens during an annual well check? The services and screenings done by your health care provider during your annual well check will depend on your age, overall health, lifestyle risk factors, and family history of disease. Counseling  Your health care provider may ask you questions about your: Alcohol  use. Tobacco use. Drug use. Emotional well-being. Home and relationship well-being. Sexual activity. Eating habits. History of falls. Memory and ability to understand (cognition). Work and work Statistician. Screening  You may have the following tests or measurements: Height, weight, and BMI. Blood pressure. Lipid and cholesterol levels. These may be checked every 5 years, or more frequently if you are over 64 years old. Skin check. Lung cancer screening. You may have this screening every year starting at age 42 if you have a 30-pack-year history of smoking and currently smoke or have quit within the past 15 years. Fecal occult blood test (FOBT) of the stool. You may have this test every year starting at age 36. Flexible sigmoidoscopy or colonoscopy. You may have a sigmoidoscopy every 5 years or a colonoscopy every 10 years starting at age 66. Prostate cancer screening. Recommendations will vary depending on your family history and other risks. Hepatitis C blood test. Hepatitis B blood test. Sexually transmitted disease (STD) testing. Diabetes screening. This is done by checking your blood sugar (glucose) after you have not eaten for a while (fasting). You may have this done every 1-3 years. Abdominal aortic aneurysm (AAA) screening. You may need this if you are a current or former smoker. Osteoporosis. You may be screened starting at age 12 if you are at high risk. Talk with your health care provider about your test results, treatment options, and if necessary, the need for more tests. Vaccines  Your health care provider may recommend certain vaccines, such as: Influenza vaccine. This is recommended every year. Tetanus, diphtheria, and acellular pertussis (Tdap, Td) vaccine. You may need a Td booster every 10 years. Zoster vaccine. You may need this after age 48. Pneumococcal 13-valent conjugate (PCV13) vaccine. One  dose is recommended after age 42. Pneumococcal polysaccharide  (PPSV23) vaccine. One dose is recommended after age 40. Talk to your health care provider about which screenings and vaccines you need and how often you need them. This information is not intended to replace advice given to you by your health care provider. Make sure you discuss any questions you have with your health care provider. Document Released: 04/15/2015 Document Revised: 12/07/2015 Document Reviewed: 01/18/2015 Elsevier Interactive Patient Education  2017 [place] Prevention in the Home Falls can cause injuries. They can happen to people of all ages. There are many things you can do to make your home safe and to help prevent falls. What can I do on the outside of my home? Regularly fix the edges of walkways and driveways and fix any cracks. Remove anything that might make you trip as you walk through a door, such as a raised step or threshold. Trim any bushes or trees on the path to your home. Use bright outdoor lighting. Clear any walking paths of anything that might make someone trip, such as rocks or tools. Regularly check to see if handrails are loose or broken. Make sure that both sides of any steps have handrails. Any raised decks and porches should have guardrails on the edges. Have any leaves, snow, or ice cleared regularly. Use sand or salt on walking paths during winter. Clean up any spills in your garage right away. This includes oil or grease spills. What can I do in the bathroom? Use night lights. Install grab bars by the toilet and in the tub and shower. Do not use towel bars as grab bars. Use non-skid mats or decals in the tub or shower. If you need to sit down in the shower, use a plastic, non-slip stool. Keep the floor dry. Clean up any water that spills on the floor as soon as it happens. Remove soap buildup in the tub or shower regularly. Attach bath mats securely with double-sided non-slip rug tape. Do not have throw rugs and other things on the  floor that can make you trip. What can I do in the bedroom? Use night lights. Make sure that you have a light by your bed that is easy to reach. Do not use any sheets or blankets that are too big for your bed. They should not hang down onto the floor. Have a firm chair that has side arms. You can use this for support while you get dressed. Do not have throw rugs and other things on the floor that can make you trip. What can I do in the kitchen? Clean up any spills right away. Avoid walking on wet floors. Keep items that you use a lot in easy-to-reach places. If you need to reach something above you, use a strong step stool that has a grab bar. Keep electrical cords out of the way. Do not use floor polish or wax that makes floors slippery. If you must use wax, use non-skid floor wax. Do not have throw rugs and other things on the floor that can make you trip. What can I do with my stairs? Do not leave any items on the stairs. Make sure that there are handrails on both sides of the stairs and use them. Fix handrails that are broken or loose. Make sure that handrails are as long as the stairways. Check any carpeting to make sure that it is firmly attached to the stairs. Fix any carpet that is loose or worn.  Avoid having throw rugs at the top or bottom of the stairs. If you do have throw rugs, attach them to the floor with carpet tape. Make sure that you have a light switch at the top of the stairs and the bottom of the stairs. If you do not have them, ask someone to add them for you. What else can I do to help prevent falls? Wear shoes that: Do not have high heels. Have rubber bottoms. Are comfortable and fit you well. Are closed at the toe. Do not wear sandals. If you use a stepladder: Make sure that it is fully opened. Do not climb a closed stepladder. Make sure that both sides of the stepladder are locked into place. Ask someone to hold it for you, if possible. Clearly mark and make  sure that you can see: Any grab bars or handrails. First and last steps. Where the edge of each step is. Use tools that help you move around (mobility aids) if they are needed. These include: Canes. Walkers. Scooters. Crutches. Turn on the lights when you go into a dark area. Replace any light bulbs as soon as they burn out. Set up your furniture so you have a clear path. Avoid moving your furniture around. If any of your floors are uneven, fix them. If there are any pets around you, be aware of where they are. Review your medicines with your doctor. Some medicines can make you feel dizzy. This can increase your chance of falling. Ask your doctor what other things that you can do to help prevent falls. This information is not intended to replace advice given to you by your health care provider. Make sure you discuss any questions you have with your health care provider. Document Released: 01/13/2009 Document Revised: 08/25/2015 Document Reviewed: 04/23/2014 Elsevier Interactive Patient Education  2017 [employer].

## 2021-01-24 ENCOUNTER — Other Ambulatory Visit: Payer: Self-pay | Admitting: Family Medicine

## 2021-01-24 DIAGNOSIS — E785 Hyperlipidemia, unspecified: Secondary | ICD-10-CM

## 2021-01-30 ENCOUNTER — Telehealth: Payer: Self-pay

## 2021-01-30 NOTE — Telephone Encounter (Signed)
   Telephone encounter was:  Successful.  01/30/2021 Name: William Williamson MRN: [id number] DOB: 06-27-1932  [name] is a 87 y.o. year old male who is a primary care patient of [name], DO . The community resource team was consulted for assistance with Caregiver Stress  Care guide performed the following interventions: Spoke with patient's son [name] confirmed email [email or web address] sent information for the [facility] and Case Assistance and Options Counseling programs.  Follow Up Plan:  Care guide will follow up with patient by phone over the next 7-10 days.  Shaiann Mcmanamon, AAS Paralegal, [facility]  [address], [place] [address] ??[email or web address]  ?? [phone]   [email or web address]

## 2021-02-06 ENCOUNTER — Telehealth: Payer: Self-pay

## 2021-02-06 NOTE — Telephone Encounter (Signed)
   Telephone encounter was:  Unsuccessful.  02/06/2021 Name: William Williamson MRN: [id number] DOB: [birth year]  Unsuccessful outbound call made today to assist with: respite care/caregiver assistance  Outreach Attempt:  2nd Attempt   Unable to leave message on [employer] does not pic-up.  Called to confirm receipt of email sent 01/30/21.  Chasty Randal, AAS Paralegal, [facility]  [address], [place] [address] ??[email or web address]  ?? [phone]   [email or web address]

## 2021-02-07 ENCOUNTER — Telehealth: Payer: Self-pay | Admitting: Neurology

## 2021-02-07 NOTE — Progress Notes (Signed)
Assessment/Plan:   1.  Parkinsons Disease  -Continue carbidopa/levodopa 2 tablets at 7am/11am/3pm and 1 tablet at 7pm.  Discussed various pillboxes.  -Continue carbidopa/levodopa 50/200 CR at bedtime   -proper taking of medication is an issue here.  Son agrees compliance an issue  -Declines therapies  2.  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  Patient needs 24 hours/day care.  -Patient has been found wandering.  -Discussed safety in the home.  Son has been trying to keep him from wandering, so reversed the locks on the door.  Worried about this as a Engineer, agricultural.  Discussed putting alarms on the house doors instead.  Had my LCSW meet with them.  -Discussed adding very low-dose quetiapine at bedtime, 12.5 mg, primarily for sleep, but also for hallucinations.  We did talk about the fact that the atypical antipsychotic medications are not indicated for dementia related psychosis and increase risk of mortality in the elderly, usually because of infectious or  cardiac related etiologies.  We discussed prolongation of the QT interval and what that means.  We discussed the black box warning in detail and how it applies in this case.  Family believes that QOL is important and they have tried nonpharmacologic therapies (maintaining schedule, attempting proper sleep hydration, redirecting, etc) without success.  They would like to continue to caregive in the home and believe that this is the only way, but worry that pt is a threat to self/others.  After this long discussion, family decided to try the medication as they feel that the benefits outweigh the risks in this case.   3.  Insomnia  -On mirtazapine by primary care.    Subjective:   William Williamson was seen today in follow up for Parkinsons disease.  My previous records were reviewed prior to todays visit as well as outside records available to me.  Patient accompanied by his son who supplements the history.  Patient called me 2  days ago stating that his medications were too strong and that he was having hallucinations.  He states that he "[place]ed [place] and about froze to death."  We obviously were very concerned and called him back quickly (within 10 minutes of his phone call) and patient stated that he had had hallucinations on and off for a year.  He had never told us this previously, but had talked to [place] previously about vivid dreams.  When we called him back to offer the appointment today, the patient stated he was not sure why he was calling and stated that the incident in the lake happened over a week ago.    Pt son explains the episode today.  States that patient woke up after midnight and was confused per son and had to go to the RR.  Ended up walking outside away from the home.  Son was in the home, but did not realize the patient had left.  Had few falls outside per son.  There is a lake at the bottom of the yard and fell in the [place] but he was able to get out of the lake and walked out of it.  He went to the neighbors house and they let him in and they called the son.  Son feels like the lake "snapped" him out of his confusion and he seemed more aware of his surroundings after that.  Son states something similar happened in march at about 2 am, wondering.  He had an episode in  May in day where he had a hallucination and thought there was a trial going on with people in the house (was possibly watching judge judy) and he was upset and went to the neighbors house.  They have since reversed the locks on the door.    Current prescribed movement disorder medications: Carbidopa/levodopa 25/100, 2 tablets at 7am/11am/3pm and 1 tablet at 7pm  Carbidopa/levodopa 50/200 at bedtime.  ALLERGIES:   Allergies  Allergen Reactions   Garlic Nausea Only   Niacin     REACTION: flushing    CURRENT MEDICATIONS:  Outpatient Encounter Medications as of 02/10/2021  Medication Sig   carbidopa-levodopa (SINEMET CR) 50-200 MG  tablet Take 1 tablet by mouth at bedtime.   carbidopa-levodopa (SINEMET IR) 25-100 MG tablet 2 tablets at 7am/11am/3pm and 1 tablet at 7pm   Cholecalciferol 25 MCG (1000 UT) CHEW Chew 1 tablet (1,000 Units total) by mouth 2 (two) times daily.   furosemide (LASIX) 20 MG tablet Take 1 tablet (20 mg total) by mouth daily.   levothyroxine (SYNTHROID) 100 MCG tablet TAKE 1 TABLET BY MOUTH ONCE DAILY BEFORE BREAKFAST   methocarbamol (ROBAXIN) 500 MG tablet Take 1 tablet by mouth 4 times daily   mirabegron ER (MYRBETRIQ) 50 MG TB24 tablet Take 1 tablet (50 mg total) by mouth daily.   mirtazapine (REMERON) 7.5 MG tablet Take 1 tablet (7.5 mg total) by mouth at bedtime.   NONFORMULARY OR COMPOUNDED ITEM Walker  #1   Dx parkinson disease   nystatin (NYSTATIN) powder Apply topically 4 (four) times daily.   simvastatin (ZOCOR) 20 MG tablet TAKE 1 TABLET BY MOUTH ONCE DAILY AT 6 PM.   lisinopril (ZESTRIL) 10 MG tablet Take 1 tablet (10 mg total) by mouth daily. (Patient not taking: Reported on 02/10/2021)   No facility-administered encounter medications on file as of 02/10/2021.    Objective:   PHYSICAL EXAMINATION:    VITALS:   Vitals:   02/10/21 0924  BP: 130/78  Pulse: 76  SpO2: 98%  Height: 6\' 1"  (1.854 m)      GEN:  The patient appears stated age and is in NAD. HEENT:  Normocephalic, atraumatic.  The mucous membranes are moist. The superficial temporal arteries are without ropiness or tenderness. CV:  RRR Lungs:  CTAB Neck/HEME:  There are no carotid bruits bilaterally.  Neurological examination:  Orientation: The patient is alert and oriented to person and place today.  Intermittently confused. Cranial nerves: There is good facial symmetry with significant facial hypomimia. The speech is fluent and hypophonic. Soft palate rises symmetrically and there is no tongue deviation. Hearing is intact to conversational tone. Sensation: Sensation is intact to light touch throughout Motor:  Strength is at least antigravity x4.  Movement examination: Tone: There is normal tone in the UE/LE Abnormal movements:  he has mild truncal dyskinesia Coordination:  There is decremation with RAM's, esp with toe taps on the L Gait and Station: Not tested today.  I have reviewed and interpreted the following labs independently    Chemistry      Component Value Date/Time   NA 138 04/12/2020 0714   K 4.2 04/12/2020 0714   CL 103 04/12/2020 0714   CO2 30 04/12/2020 0714   BUN 23 04/12/2020 0714   CREATININE 1.21 04/12/2020 0714      Component Value Date/Time   CALCIUM 9.3 04/12/2020 0714   ALKPHOS 45 04/12/2020 0714   AST 17 04/12/2020 0714   ALT 11 04/12/2020 0714   BILITOT  0.6 04/12/2020 0714       Lab Results  Component Value Date   WBC 7.1 04/12/2020   HGB 13.7 04/12/2020   HCT 41.0 04/12/2020   MCV 95.8 04/12/2020   PLT 196.0 04/12/2020    Lab Results  Component Value Date   TSH 2.80 01/18/2020     Total time spent on today's visit was 33 minutes, including both face-to-face time and nonface-to-face time.  Time included that spent on review of records (prior notes available to me/labs/imaging if pertinent), discussing treatment and goals, answering patient's questions and coordinating care.  Cc:  [name], DO

## 2021-02-07 NOTE — Telephone Encounter (Signed)
Patient agreed to appointment on Friday. When I called patient back his son was there with him.He was not sure why I was calling and even told me that the incident in the lake happened a week or so ago. I reminded patient of our conversation earlier today .

## 2021-02-07 NOTE — Telephone Encounter (Deleted)
Patient agreed to appt and is scheduled to see dr. Carles Collet on Friday at 9:15

## 2021-02-07 NOTE — Telephone Encounter (Signed)
Pt said he doesn't know if his last dose of meds are too strong for him. He is having hallucinations. He walked into a lake and about froze to death. He needs a call

## 2021-02-07 NOTE — Telephone Encounter (Signed)
Pt called with c/o:  confusion/hallucinations New medications?  No When did they start?  Doesn't know off and on since last winter  If hallucinations are new, has patient been checked for infection, including UTI?  No. Current medications prescribed by Dr. Carles Collet and TIMES taking the medications: Below     -Continue carbidopa/levodopa 2 tablets at 7am/11am/3pm and 1 tablet at 7pm.  Discussed various pillboxes.             -Continue carbidopa/levodopa 50/200 CR at bedtime  I suggested to patient that he call PCP and he did not want to do that. He said he has been having these spells off and on since last winter. Patient very confused and mostly responded I don't know to most of my questions

## 2021-02-10 ENCOUNTER — Ambulatory Visit: Payer: PPO | Admitting: Neurology

## 2021-02-10 ENCOUNTER — Encounter: Payer: Self-pay | Admitting: Neurology

## 2021-02-10 ENCOUNTER — Other Ambulatory Visit: Payer: Self-pay

## 2021-02-10 ENCOUNTER — Telehealth: Payer: Self-pay

## 2021-02-10 VITALS — BP 130/78 | HR 76 | Ht 73.0 in

## 2021-02-10 DIAGNOSIS — F0280 Dementia in other diseases classified elsewhere without behavioral disturbance: Secondary | ICD-10-CM | POA: Diagnosis not present

## 2021-02-10 DIAGNOSIS — G20 Parkinson's disease: Secondary | ICD-10-CM

## 2021-02-10 MED ORDER — QUETIAPINE FUMARATE 25 MG PO TABS: 12.5000 mg | ORAL_TABLET | Freq: Every day | ORAL | 1 refills | Status: DC

## 2021-02-10 NOTE — Telephone Encounter (Signed)
   Telephone encounter was:  Successful.  02/10/2021 Name: William Williamson MRN: [id number] DOB: 02/10/1933  [name] is a 87 y.o. year old male who is a primary care patient of [name], DO . The community resource team was consulted for assistance with  respite care.  Care guide performed the following interventions: Spoke with patient's son [name], has has received emailed resources sent on 10/31.    Follow Up Plan:  No further follow up planned at this time. The patient has been provided with needed resources.  Megahn Killings, AAS Paralegal, [facility]  [address], [place] [address] ??[email or web address]  ?? [phone]   [email or web address]

## 2021-02-10 NOTE — Patient Instructions (Addendum)
Start quetiapine, 25mg , 1/2 tablet at bedtime.   This is a very small dosage.

## 2021-02-14 ENCOUNTER — Other Ambulatory Visit: Payer: Self-pay | Admitting: Family Medicine

## 2021-02-14 DIAGNOSIS — E039 Hypothyroidism, unspecified: Secondary | ICD-10-CM

## 2021-02-21 ENCOUNTER — Other Ambulatory Visit: Payer: Self-pay | Admitting: Neurology

## 2021-02-21 DIAGNOSIS — F0280 Dementia in other diseases classified elsewhere without behavioral disturbance: Secondary | ICD-10-CM

## 2021-02-21 DIAGNOSIS — G20 Parkinson's disease: Secondary | ICD-10-CM

## 2021-03-14 DIAGNOSIS — X32XXXD Exposure to sunlight, subsequent encounter: Secondary | ICD-10-CM | POA: Diagnosis not present

## 2021-03-14 DIAGNOSIS — L570 Actinic keratosis: Secondary | ICD-10-CM | POA: Diagnosis not present

## 2021-03-14 DIAGNOSIS — D225 Melanocytic nevi of trunk: Secondary | ICD-10-CM | POA: Diagnosis not present

## 2021-03-14 DIAGNOSIS — Z1283 Encounter for screening for malignant neoplasm of skin: Secondary | ICD-10-CM | POA: Diagnosis not present

## 2021-03-16 ENCOUNTER — Encounter: Payer: Self-pay | Admitting: Family Medicine

## 2021-03-16 ENCOUNTER — Ambulatory Visit (INDEPENDENT_AMBULATORY_CARE_PROVIDER_SITE_OTHER): Payer: PPO | Admitting: Family Medicine

## 2021-03-16 VITALS — BP 164/90 | HR 77 | Temp 97.6°F | Ht 72.0 in | Wt 167.0 lb

## 2021-03-16 DIAGNOSIS — Z23 Encounter for immunization: Secondary | ICD-10-CM

## 2021-03-16 DIAGNOSIS — R296 Repeated falls: Secondary | ICD-10-CM | POA: Diagnosis not present

## 2021-03-16 DIAGNOSIS — F02C18 Dementia in other diseases classified elsewhere, severe, with other behavioral disturbance: Secondary | ICD-10-CM

## 2021-03-16 DIAGNOSIS — R197 Diarrhea, unspecified: Secondary | ICD-10-CM | POA: Diagnosis not present

## 2021-03-16 DIAGNOSIS — G20 Parkinson's disease: Secondary | ICD-10-CM | POA: Diagnosis not present

## 2021-03-16 DIAGNOSIS — R443 Hallucinations, unspecified: Secondary | ICD-10-CM

## 2021-03-16 DIAGNOSIS — R972 Elevated prostate specific antigen [PSA]: Secondary | ICD-10-CM

## 2021-03-16 DIAGNOSIS — E039 Hypothyroidism, unspecified: Secondary | ICD-10-CM | POA: Diagnosis not present

## 2021-03-16 DIAGNOSIS — R195 Other fecal abnormalities: Secondary | ICD-10-CM | POA: Diagnosis not present

## 2021-03-16 LAB — COMPREHENSIVE METABOLIC PANEL
ALT: 6 U/L (ref 0–53)
AST: 17 U/L (ref 0–37)
Albumin: 4.2 g/dL (ref 3.5–5.2)
Alkaline Phosphatase: 51 U/L (ref 39–117)
BUN: 27 mg/dL — ABNORMAL HIGH (ref 6–23)
CO2: 32 mEq/L (ref 19–32)
Calcium: 8.9 mg/dL (ref 8.4–10.5)
Chloride: 102 mEq/L (ref 96–112)
Creatinine, Ser: 1.19 mg/dL (ref 0.40–1.50)
GFR: 55.52 mL/min — ABNORMAL LOW (ref >60.00)
Glucose, Bld: 79 mg/dL (ref 70–99)
Potassium: 4.1 mEq/L (ref 3.5–5.1)
Sodium: 139 mEq/L (ref 135–145)
Total Bilirubin: 0.7 mg/dL (ref 0.2–1.2)
Total Protein: 6.5 g/dL (ref 6.0–8.3)

## 2021-03-16 LAB — CBC WITH DIFFERENTIAL/PLATELET
Basophils Absolute: 0 10*3/uL (ref 0.0–0.1)
Basophils Relative: 0.1 % (ref 0.0–3.0)
Eosinophils Absolute: 0.1 10*3/uL (ref 0.0–0.7)
Eosinophils Relative: 0.9 % (ref 0.0–5.0)
HCT: 38.6 % — ABNORMAL LOW (ref 39.0–52.0)
Hemoglobin: 13 g/dL (ref 13.0–17.0)
Lymphocytes Relative: 37.9 % (ref 12.0–46.0)
Lymphs Abs: 2.2 10*3/uL (ref 0.7–4.0)
MCHC: 33.6 g/dL (ref 30.0–36.0)
MCV: 94 fl (ref 78.0–100.0)
Monocytes Absolute: 0.5 10*3/uL (ref 0.1–1.0)
Monocytes Relative: 8.6 % (ref 3.0–12.0)
Neutro Abs: 3 10*3/uL (ref 1.4–7.7)
Neutrophils Relative %: 52.5 % (ref 43.0–77.0)
Platelets: 158 10*3/uL (ref 150.0–400.0)
RBC: 4.1 Mil/uL — ABNORMAL LOW (ref 4.22–5.81)
RDW: 13.1 % (ref 11.5–15.5)
WBC: 5.7 10*3/uL (ref 4.0–10.5)

## 2021-03-16 LAB — PSA: PSA: 4.61 ng/mL — ABNORMAL HIGH (ref 0.10–4.00)

## 2021-03-16 MED ORDER — TETANUS-DIPHTH-ACELL PERTUSSIS 5-2.5-18.5 LF-MCG/0.5 IM SUSP: 0.5000 mL | Freq: Once | INTRAMUSCULAR | 0 refills | Status: AC

## 2021-03-16 NOTE — Assessment & Plan Note (Signed)
seroquel is helping him sleep and stopped the hallucinations

## 2021-03-16 NOTE — Progress Notes (Addendum)
Subjective:   By signing my name below, I, William Williamson, attest that this documentation has been prepared under the direction and in the presence of William Held, DO. 03/16/2021   Patient ID: William Williamson, male    DOB: Dec 18, 1932, 87 y.o.   MRN: [id number]  Chief Complaint  Patient presents with   Diarrhea    1 month started to have BM.  Loose stool for the month    HPI Patient is in today for an office visit. He is accompanied by his son.  His son reports that about a month ago, he had to stop using laxatives to manage his constipation. Recently, his bowel movements have become softer and it only happens once a day. Denies abdominal pain, nausea, vomiting and blood in stool.  His neurologist gave him 25 mg Seroquel to help him sleep and once when he stopped using it, he woke up three times at night. He has since restarted the medication. He still refuses physical therapy and does not use his walker at home.   He has received the flu vaccine. He will receive the tetanus vaccine at this visit.   Past Medical History:  Diagnosis Date   Anemia 1952   post Oromycin for [place]   Anisocoria    post op   Arthritis    Autonomic dysfunction    GERD (gastroesophageal reflux disease)    Glaucoma    Dr William Williamson   Hearing loss in right ear    Hypercholesteremia    Hyperthyroidism    s/p RAI   LVH (left ventricular hypertrophy)    Orthostatic hypotension    Parkinson's disease [facility])     Past Surgical History:  Procedure Laterality Date   CARDIAC CATHETERIZATION  07/2006   Dr.  Eustace Williamson, negative   CATARACT EXTRACTION, BILATERAL     OS retinal surgery for post op floaters   COLONOSCOPY     negative X 3   RAI ablation  02/2008   hyperthyroidism   SEPTOPLASTY  1970   VASECTOMY      Family History  Problem Relation Age of Onset   Osteoporosis Mother    Hypertension Sister    Hypertension Brother         X 3   Diabetes Brother    Prostate cancer Brother     Stomach cancer Maternal Grandmother    Pancreatic cancer Brother        ???   Heart attack Maternal Grandfather 62   Stroke Neg Hx     Social History   Socioeconomic History   Marital status: Widowed    Spouse name: Not on file   Number of children: Not on file   Years of education: Not on file   Highest education level: Not on file  Occupational History   Occupation: Retired    Comment: personnel  Tobacco Use   Smoking status: Former    Types: Cigarettes    Quit date: 04/02/1968    Years since quitting: 52.9   Smokeless tobacco: Never  Vaping Use   Vaping Use: Never used  Substance and Sexual Activity   Alcohol use: Yes    Alcohol/week: 6.0 standard drinks    Types: 6 Cans of beer per week   Drug use: No   Sexual activity: Not on file  Other Topics Concern   Not on file  Social History Narrative   Son Actor of Forensic psychologist   Social Determinants of Health   Financial  Resource Strain: Low Risk    Difficulty of Paying Living Expenses: Not hard at all  Food Insecurity: No Food Insecurity   Worried About Charity fundraiser in the Last Year: Never true   Ran Out of Food in the Last Year: Never true  Transportation Needs: No Transportation Needs   Lack of Transportation (Medical): No   Lack of Transportation (Non-Medical): No  Physical Activity: Insufficiently Active   Days of Exercise per Week: 3 days   Minutes of Exercise per Session: 20 min  Stress: No Stress Concern Present   Feeling of Stress : Not at all  Social Connections: Socially Isolated   Frequency of Communication with Friends and Family: Once a week   Frequency of Social Gatherings with Friends and Family: Once a week   Attends Religious Services: Never   Marine scientist or Organizations: No   Attends Archivist Meetings: Never   Marital Status: Widowed  Human resources officer Violence: Not At Risk   Fear of Current or Ex-Partner: No   Emotionally Abused: No   Physically Abused: No    Sexually Abused: No    Outpatient Medications Prior to Visit  Medication Sig Dispense Refill   carbidopa-levodopa (SINEMET CR) 50-200 MG tablet TAKE 1 TABLET BY MOUTH AT BEDTIME 90 tablet 1   carbidopa-levodopa (SINEMET IR) 25-100 MG tablet TAKE 2 TABLETS BY MOUTH AT 7AM 11AM 3PM AND 1 TABLET AT 7PM 630 tablet 1   Cholecalciferol 25 MCG (1000 UT) CHEW Chew 1 tablet (1,000 Units total) by mouth 2 (two) times daily.     furosemide (LASIX) 20 MG tablet Take 1 tablet (20 mg total) by mouth daily. 90 tablet 3   levothyroxine (SYNTHROID) 100 MCG tablet TAKE 1 TABLET BY MOUTH ONCE DAILY BEFORE BREAKFAST 90 tablet 0   methocarbamol (ROBAXIN) 500 MG tablet Take 1 tablet by mouth 4 times daily 45 tablet 0   mirabegron ER (MYRBETRIQ) 50 MG TB24 tablet Take 1 tablet (50 mg total) by mouth daily. 90 tablet 3   mirtazapine (REMERON) 7.5 MG tablet Take 1 tablet (7.5 mg total) by mouth at bedtime. 90 tablet 3   NONFORMULARY OR COMPOUNDED ITEM Walker  #1   Dx parkinson disease 1 each 0   nystatin (NYSTATIN) powder Apply topically 4 (four) times daily. 15 g 0   QUEtiapine (SEROQUEL) 25 MG tablet Take 0.5 tablets (12.5 mg total) by mouth at bedtime. 45 tablet 1   simvastatin (ZOCOR) 20 MG tablet TAKE 1 TABLET BY MOUTH ONCE DAILY AT 6 PM. 90 tablet 0   lisinopril (ZESTRIL) 10 MG tablet Take 1 tablet (10 mg total) by mouth daily. (Patient not taking: Reported on 02/10/2021) 90 tablet 3   No facility-administered medications prior to visit.    Allergies  Allergen Reactions   Garlic Nausea Only   Niacin     REACTION: flushing    Review of Systems  Constitutional:  Negative for fever.  HENT:  Negative for congestion, ear pain, hearing loss, sinus pain and sore throat.   Eyes:  Negative for blurred vision and pain.  Respiratory:  Negative for cough, sputum production, shortness of breath and wheezing.   Cardiovascular:  Negative for chest pain and palpitations.  Gastrointestinal:  Positive for diarrhea.  Negative for blood in stool, constipation, nausea and vomiting.  Genitourinary:  Negative for dysuria, frequency, hematuria and urgency.  Musculoskeletal:  Negative for back pain, falls and myalgias.  Neurological:  Negative for dizziness, sensory change, loss  of consciousness, weakness and headaches.  Endo/Heme/Allergies:  Negative for environmental allergies. Does not bruise/bleed easily.  Psychiatric/Behavioral:  Negative for depression and suicidal ideas. The patient is not nervous/anxious and does not have insomnia.       Objective:    Physical Exam Constitutional:      General: He is not in acute distress.    Appearance: Normal appearance. He is not ill-appearing.  HENT:     Head: Normocephalic and atraumatic.     Right Ear: Tympanic membrane, ear canal and external ear normal.     Left Ear: Tympanic membrane, ear canal and external ear normal.  Eyes:     Pupils: Pupils are equal, round, and reactive to light.  Cardiovascular:     Rate and Rhythm: Normal rate and regular rhythm.     Pulses: Normal pulses.     Heart sounds: No murmur heard.   No gallop.  Pulmonary:     Effort: Pulmonary effort is normal. No respiratory distress.     Breath sounds: Normal breath sounds. No wheezing or rhonchi.  Abdominal:     General: Bowel sounds are normal. There is no distension.     Palpations: Abdomen is soft.     Tenderness: There is no abdominal tenderness. There is no guarding.     Hernia: No hernia is present.  Musculoskeletal:     Cervical back: Neck supple.  Lymphadenopathy:     Cervical: No cervical adenopathy.  Skin:    General: Skin is warm and dry.  Neurological:     Mental Status: He is alert and oriented to person, place, and time.    BP (!) 164/90    Pulse 77    Temp 97.6 F (36.4 C) (Oral)    Ht 6' (1.829 m)    Wt 167 lb (75.8 kg)    SpO2 94%    BMI 22.65 kg/m  Wt Readings from Last 3 Encounters:  03/16/21 167 lb (75.8 kg)  01/23/21 171 lb (77.6 kg)  11/18/20  171 lb 3.2 oz (77.7 kg)    Diabetic Foot Exam - Simple   No data filed    Lab Results  Component Value Date   WBC 7.1 04/12/2020   HGB 13.7 04/12/2020   HCT 41.0 04/12/2020   PLT 196.0 04/12/2020   GLUCOSE 80 04/12/2020   CHOL 131 04/12/2020   TRIG 63.0 04/12/2020   HDL 46.40 04/12/2020   LDLCALC 72 04/12/2020   ALT 11 04/12/2020   AST 17 04/12/2020   NA 138 04/12/2020   K 4.2 04/12/2020   CL 103 04/12/2020   CREATININE 1.21 04/12/2020   BUN 23 04/12/2020   CO2 30 04/12/2020   TSH 2.80 01/18/2020   PSA 4.99 (H) 04/12/2020   INR 0.9 RATIO 07/26/2006   HGBA1C 5.9 10/09/2006    Lab Results  Component Value Date   TSH 2.80 01/18/2020   Lab Results  Component Value Date   WBC 7.1 04/12/2020   HGB 13.7 04/12/2020   HCT 41.0 04/12/2020   MCV 95.8 04/12/2020   PLT 196.0 04/12/2020   Lab Results  Component Value Date   NA 138 04/12/2020   K 4.2 04/12/2020   CO2 30 04/12/2020   GLUCOSE 80 04/12/2020   BUN 23 04/12/2020   CREATININE 1.21 04/12/2020   BILITOT 0.6 04/12/2020   ALKPHOS 45 04/12/2020   AST 17 04/12/2020   ALT 11 04/12/2020   PROT 6.8 04/12/2020   ALBUMIN 4.5 04/12/2020   CALCIUM  9.3 04/12/2020   GFR 54.77 (L) 04/12/2020   Lab Results  Component Value Date   CHOL 131 04/12/2020   Lab Results  Component Value Date   HDL 46.40 04/12/2020   Lab Results  Component Value Date   LDLCALC 72 04/12/2020   Lab Results  Component Value Date   TRIG 63.0 04/12/2020   Lab Results  Component Value Date   CHOLHDL 3 04/12/2020   Lab Results  Component Value Date   HGBA1C 5.9 10/09/2006       Assessment & Plan:   Problem List Items Addressed This Visit       Unprioritized   Hypothyroidism   Relevant Orders   Thyroid Panel With TSH   Frequent falls    Pt still refusing Pt and not using his walker  He refuses to use the walker because he does not want to have to depend on it.   D/w pt PD is progressive and he needs to use his walker to  prevent falls       Hallucinations    seroquel is helping him sleep and stopped the hallucinations       Loose stools - Primary    ? 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    Dispense:  0.5 mL    Refill:  0    Substitution is accepted. Please send fax when administered to: [phone]    I,William Williamson,acting as a scribe for [employer], DO.,have documented all relevant documentation on the behalf of William Held, DO,as directed by  William Held, DO while in the presence of William Williamson, [place], DO., personally preformed the services described in this documentation.  All medical record entries made by the scribe were at my direction and in my presence.  I have reviewed the chart and discharge instructions (if applicable) and agree that the record reflects my personal performance and is accurate and complete. 03/16/2021

## 2021-03-16 NOTE — Assessment & Plan Note (Signed)
?   From seroquil --- doubt Only 1x a day--- he was constipated so this is actually an improvement No abd pain / cramping  Pt is concerned about a colon issue Check I fob and labs  Consider GI if worsens

## 2021-03-16 NOTE — Patient Instructions (Signed)
Fall Prevention in Hospitals, Adult Being a patient in the hospital puts you at risk for falling. Falls can cause serious injury and harm, but they can be prevented. It is important to understand what puts you at risk for falling and what you and your health care team can do to prevent you from falling. If you or a loved one falls in the hospital, it is important to tell the hospital staff about it. What increases my risk? Certain conditions and treatments may increase your risk of falling in the hospital. These include: Being in an unfamiliar environment, especially when using the bathroom at night. Having surgery. Being on bed rest. Taking many medicines or certain types of medicines, such as sleeping pills. Having tubes in place, such as IV lines or catheters. Other risk factors for falls in a hospital include: Having difficulty with hearing or vision. Having a change in thinking or behavior, such as confusion. Having depression. Having trouble with balance or feeling dizzy. Needing to use the toilet frequently. Having fallen during the past 3 months. Having low blood pressure. What are some strategies for preventing falls? If you or a loved one has to stay in the hospital: Ask about which fall prevention strategies will be in place. Speak up if the fall prevention plan changes. Ask for help moving around, especially after surgery or when not feeling well. If you have been asked to call for help when getting up, do not get up by yourself. Asking for help to get up is for your safety, and the staff is there to help you. Wear nonskid footwear. Get up slowly, and sit at the side of the bed for a few minutes before standing up. Keep items you need close to you, such as the nurse call button or a phone, so that you do not need to reach for them. Wear eyeglasses or hearing aids if they have been prescribed. Have someone stay in the hospital with you or your loved one. Ask if sleeping pills or  other medicines that can cause confusion are necessary. What does the hospital staff do to help prevent falls?   Hospitals have systems in place to prevent falls and accidents, which may involve: Discussing your fall risks and making a personalized fall prevention plan. Checking in regularly to see if you need help. Placing an arm band on your wrist or a sign near your room to alert other staff of your needs. Using an alarm on your hospital bed. This is an alarm that goes off if you get out of bed and forget to call for help. Keeping the bed in a low and locked position. Keeping the area around the bed and bathroom well-lit and free from clutter. Keeping your room quiet, so that you can sleep and be well rested. Using safety equipment, such as: A belt around your waist. Walkers, crutches, and other devices for support. Safety beds, such as low beds, or cushions on the floor next to the bed. Having a staff person stay with you (one-on-one observation), even when you are using the bathroom. This is for your safety. Using video monitoring. This allows a staff member to come to you if you need help. What other actions can I take to lower my risk of falls? Check in regularly with your health care provider or pharmacist to review all medicines that you take. Make sure that you have a regular exercise program to stay fit. This will help you maintain your balance. Talk with  a physical therapist or trainer if recommended by your health care provider. He or she can help you improve your strength, balance, and endurance. If you are over age 52: Ask your health care provider if you need a calcium or vitamin D supplement. Have your eyes and hearing checked every year. Have your feet checked every year. Where to find more information Centers for Disease Control and Prevention: http://www.wolf.info/ Summary Being in an unfamiliar environment, such as the hospital, increases your risk for falling. If you have  been asked to call for help when getting up, do not get up by yourself. Asking for help to get up is for your safety, and the staff is there to help you. Ask about which fall prevention strategies will be in place. Speak up if the fall prevention plan changes. If you or a loved one falls, tell the hospital staff. This is important. This information is not intended to replace advice given to you by your health care provider. Make sure you discuss any questions you have with your health care provider. Document Revised: 10/21/2019 Document Reviewed: 10/21/2019 Elsevier Patient Education  [employer].

## 2021-03-16 NOTE — Assessment & Plan Note (Signed)
Pt still refusing Pt and not using his walker  He refuses to use the walker because he does not want to have to depend on it.   D/w pt PD is progressive and he needs to use his walker to prevent falls

## 2021-03-17 LAB — THYROID PANEL WITH TSH
Free Thyroxine Index: 2.2 (ref 1.4–3.8)
T3 Uptake: 35 % (ref 22–35)
T4, Total: 6.2 ug/dL (ref 4.9–10.5)
TSH: 1.7 mIU/L (ref 0.40–4.50)

## 2021-03-21 ENCOUNTER — Other Ambulatory Visit (INDEPENDENT_AMBULATORY_CARE_PROVIDER_SITE_OTHER): Payer: PPO

## 2021-03-21 DIAGNOSIS — R195 Other fecal abnormalities: Secondary | ICD-10-CM

## 2021-03-21 LAB — FECAL OCCULT BLOOD, IMMUNOCHEMICAL: Fecal Occult Bld: NEGATIVE

## 2021-04-24 ENCOUNTER — Other Ambulatory Visit: Payer: Self-pay | Admitting: Family Medicine

## 2021-04-24 DIAGNOSIS — R600 Localized edema: Secondary | ICD-10-CM

## 2021-04-24 DIAGNOSIS — E785 Hyperlipidemia, unspecified: Secondary | ICD-10-CM

## 2021-05-02 ENCOUNTER — Other Ambulatory Visit: Payer: Self-pay | Admitting: Family Medicine

## 2021-05-02 DIAGNOSIS — R600 Localized edema: Secondary | ICD-10-CM

## 2021-05-19 ENCOUNTER — Other Ambulatory Visit: Payer: Self-pay | Admitting: Family Medicine

## 2021-05-19 DIAGNOSIS — E039 Hypothyroidism, unspecified: Secondary | ICD-10-CM

## 2021-05-19 DIAGNOSIS — R32 Unspecified urinary incontinence: Secondary | ICD-10-CM

## 2021-05-24 NOTE — Progress Notes (Unsigned)
Assessment/Plan:   1.  Parkinsons Disease  -Continue carbidopa/levodopa 2 tablets at 7am/11am/3pm and 1 tablet at 7pm.  Discussed various pillboxes.  -Continue carbidopa/levodopa 50/200 CR at bedtime   -proper taking of medication is an issue here.  Son agrees compliance an issue  -Declines therapies  2.  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  Patient needs 24 hours/day care.  -Patient has been found wandering.  -Discussed safety in the home.  Son has been trying to keep him from wandering, so reversed the locks on the door.  Worried about this as a Engineer, agricultural.  Discussed putting alarms on the house doors instead.  Had my LCSW meet with them.  -Continue quetiapine, 12.5 mg at bedtime for sleep and hallucinations.  Patient/family understand black box warning.   3.  Insomnia  -On mirtazapine by primary care.    Subjective:   William Williamson was seen today in follow up for Parkinsons disease.  My previous records were reviewed prior to todays visit as well as outside records available to me.  Patient accompanied by his son who supplements the history.  ***Quetiapine started last visit, primarily for sleep, but also for wandering at bedtime.  Primary care records from December indicate that patient had stopped it and then could not sleep.  He then restarted it.  Primary care also noted that patient continues to decline therapies, and continues to decline walker.  Current prescribed movement disorder medications: Carbidopa/levodopa 25/100, 2 tablets at 7am/11am/3pm and 1 tablet at 7pm  Carbidopa/levodopa 50/200 CR at bedtime. Quetiapine, 12.5 mg at bedtime (started last visit)  ALLERGIES:   Allergies  Allergen Reactions   Garlic Nausea Only   Niacin     REACTION: flushing    CURRENT MEDICATIONS:  Outpatient Encounter Medications as of 05/26/2021  Medication Sig   simvastatin (ZOCOR) 20 MG tablet TAKE 1 TABLET BY MOUTH ONCE DAILY 6 IN THE EVENING    carbidopa-levodopa (SINEMET CR) 50-200 MG tablet TAKE 1 TABLET BY MOUTH AT BEDTIME   carbidopa-levodopa (SINEMET IR) 25-100 MG tablet TAKE 2 TABLETS BY MOUTH AT 7AM 11AM 3PM AND 1 TABLET AT 7PM   Cholecalciferol 25 MCG (1000 UT) CHEW Chew 1 tablet (1,000 Units total) by mouth 2 (two) times daily.   furosemide (LASIX) 20 MG tablet Take 1 tablet by mouth once daily   levothyroxine (SYNTHROID) 100 MCG tablet TAKE 1 TABLET BY MOUTH ONCE DAILY BEFORE BREAKFAST   lisinopril (ZESTRIL) 10 MG tablet Take 1 tablet (10 mg total) by mouth daily. (Patient not taking: Reported on 02/10/2021)   methocarbamol (ROBAXIN) 500 MG tablet Take 1 tablet by mouth 4 times daily   mirtazapine (REMERON) 7.5 MG tablet Take 1 tablet (7.5 mg total) by mouth at bedtime.   MYRBETRIQ 50 MG TB24 tablet Take 1 tablet by mouth once daily   NONFORMULARY OR COMPOUNDED ITEM Walker  #1   Dx parkinson disease   nystatin (NYSTATIN) powder Apply topically 4 (four) times daily.   QUEtiapine (SEROQUEL) 25 MG tablet Take 0.5 tablets (12.5 mg total) by mouth at bedtime.   No facility-administered encounter medications on file as of 05/26/2021.    Objective:   PHYSICAL EXAMINATION:    VITALS:   There were no vitals filed for this visit.     GEN:  The patient appears stated age and is in NAD. HEENT:  Normocephalic, atraumatic.  The mucous membranes are moist. The superficial temporal arteries are without ropiness or tenderness. CV:  RRR Lungs:  CTAB Neck/HEME:  There are no carotid bruits bilaterally.  Neurological examination:  Orientation: The patient is alert and oriented to person and place today.  Intermittently confused. Cranial nerves: There is good facial symmetry with significant facial hypomimia. The speech is fluent and hypophonic. Soft palate rises symmetrically and there is no tongue deviation. Hearing is intact to conversational tone. Sensation: Sensation is intact to light touch throughout Motor: Strength is at  least antigravity x4.  Movement examination: Tone: There is normal tone in the UE/LE Abnormal movements:  he has mild truncal dyskinesia Coordination:  There is decremation with RAM's, esp with toe taps on the L Gait and Station: Not tested today.  I have reviewed and interpreted the following labs independently    Chemistry      Component Value Date/Time   NA 139 03/16/2021 1203   K 4.1 03/16/2021 1203   CL 102 03/16/2021 1203   CO2 32 03/16/2021 1203   BUN 27 (H) 03/16/2021 1203   CREATININE 1.19 03/16/2021 1203      Component Value Date/Time   CALCIUM 8.9 03/16/2021 1203   ALKPHOS 51 03/16/2021 1203   AST 17 03/16/2021 1203   ALT 6 03/16/2021 1203   BILITOT 0.7 03/16/2021 1203       Lab Results  Component Value Date   WBC 5.7 03/16/2021   HGB 13.0 03/16/2021   HCT 38.6 (L) 03/16/2021   MCV 94.0 03/16/2021   PLT 158.0 03/16/2021    Lab Results  Component Value Date   TSH 1.70 03/16/2021     Total time spent on today's visit was *** minutes, including both face-to-face time and nonface-to-face time.  Time included that spent on review of records (prior notes available to me/labs/imaging if pertinent), discussing treatment and goals, answering patient's questions and coordinating care.  Cc:  [name], DO

## 2021-05-26 ENCOUNTER — Encounter: Payer: Self-pay | Admitting: Neurology

## 2021-05-26 ENCOUNTER — Ambulatory Visit: Payer: PPO | Admitting: Neurology

## 2021-05-26 DIAGNOSIS — Z029 Encounter for administrative examinations, unspecified: Secondary | ICD-10-CM

## 2021-06-05 DIAGNOSIS — H401131 Primary open-angle glaucoma, bilateral, mild stage: Secondary | ICD-10-CM | POA: Diagnosis not present

## 2021-06-07 ENCOUNTER — Telehealth: Payer: Self-pay | Admitting: *Deleted

## 2021-06-07 NOTE — Telephone Encounter (Signed)
Pt needs Ov per Dr Fuller Plan- called pt- LM to return call to make an OV - I have canceled the PV and scheduled colon until after the OV  ?Lelan Pons PV  ?

## 2021-06-07 NOTE — Telephone Encounter (Signed)
Please schedule an office visit to evaluate for possible colonoscopy.  ?

## 2021-06-07 NOTE — Telephone Encounter (Signed)
Dr Fuller Plan, ? ?This pt has been scheduled a colon with you for 06-19-21- he is 88 years old, his last colon in 2016 he had a  semi-pedunculated polyp in the rectum and the procedure note states no recall due to age.  The scheduler made this with the patient yesterday.  I do not see any OV since 2016, he had fecal occult blood cards in 03-2021 that were negative. ? ?Do we proceed with the colon , Does he need an OV ? ? ?Please advise, Thanks  Lelan Pons PV  ? ? ?

## 2021-06-07 NOTE — Telephone Encounter (Signed)
OV 3-28 Tuesday at 1110 am with Dr Fuller Plan-  sch with pt and son-  pt states he has always had constipation and the last few months having diarrhea and change in bowel habits-  noted for MD  ? ?Lelan Pons PV  ?

## 2021-06-09 ENCOUNTER — Other Ambulatory Visit: Payer: Self-pay | Admitting: Family Medicine

## 2021-06-09 DIAGNOSIS — G4700 Insomnia, unspecified: Secondary | ICD-10-CM

## 2021-06-19 ENCOUNTER — Encounter: Payer: PPO | Admitting: Gastroenterology

## 2021-06-27 ENCOUNTER — Ambulatory Visit: Payer: PPO | Admitting: Gastroenterology

## 2021-06-27 ENCOUNTER — Encounter: Payer: Self-pay | Admitting: Gastroenterology

## 2021-06-27 VITALS — BP 140/84 | HR 68 | Ht 73.0 in | Wt 169.0 lb

## 2021-06-27 DIAGNOSIS — R194 Change in bowel habit: Secondary | ICD-10-CM | POA: Diagnosis not present

## 2021-06-27 DIAGNOSIS — K5909 Other constipation: Secondary | ICD-10-CM | POA: Diagnosis not present

## 2021-06-27 NOTE — Progress Notes (Signed)
? ? ?History of Present Illness: This is an 31 year referred by [name], * DO for the evaluation of changes in bowel habits.  He is accompanied by his son who provides a portion of the history.  He relates intermittent episodes of diarrhea alternating with constipation since December.  Stool Hemoccults at the time were negative.  He notes no diet or medication changes.  He has Parkinson's disease and has had constipation for 8 years.  He typically takes Dulcolax and glycerin suppositories as needed.  For the past 6 weeks or so he has had constipation in his typical previous bowel pattern without episodes of diarrhea.  No other gastrointestinal complaints.Denies weight loss, abdominal pain, change in stool caliber, melena, hematochezia, nausea, vomiting, dysphagia, reflux symptoms, chest pain. ? ? ? ?Allergies  ?Allergen Reactions  ? Garlic Nausea Only  ? Niacin   ?  REACTION: flushing  ? ?Outpatient Medications Prior to Visit  ?Medication Sig Dispense Refill  ? carbidopa-levodopa (SINEMET CR) 50-200 MG tablet TAKE 1 TABLET BY MOUTH AT BEDTIME 90 tablet 1  ? carbidopa-levodopa (SINEMET IR) 25-100 MG tablet TAKE 2 TABLETS BY MOUTH AT 7AM 11AM 3PM AND 1 TABLET AT 7PM 630 tablet 1  ? Cholecalciferol 25 MCG (1000 UT) CHEW Chew 1 tablet (1,000 Units total) by mouth 2 (two) times daily.    ? furosemide (LASIX) 20 MG tablet Take 1 tablet by mouth once daily 90 tablet 1  ? levothyroxine (SYNTHROID) 100 MCG tablet TAKE 1 TABLET BY MOUTH ONCE DAILY BEFORE BREAKFAST 90 tablet 0  ? lisinopril (ZESTRIL) 10 MG tablet Take 1 tablet (10 mg total) by mouth daily. 90 tablet 3  ? methocarbamol (ROBAXIN) 500 MG tablet Take 1 tablet by mouth 4 times daily 45 tablet 0  ? mirtazapine (REMERON) 7.5 MG tablet TAKE 1 TABLET BY MOUTH AT BEDTIME (Patient not taking: Reported on 06/27/2021) 90 tablet 1  ? MYRBETRIQ 50 MG TB24 tablet Take 1 tablet by mouth once daily 90 tablet 0  ? NONFORMULARY OR COMPOUNDED ITEM Walker  #1   Dx  parkinson disease 1 each 0  ? nystatin (NYSTATIN) powder Apply topically 4 (four) times daily. 15 g 0  ? QUEtiapine (SEROQUEL) 25 MG tablet Take 0.5 tablets (12.5 mg total) by mouth at bedtime. 45 tablet 1  ? simvastatin (ZOCOR) 20 MG tablet TAKE 1 TABLET BY MOUTH ONCE DAILY 6 IN THE EVENING 90 tablet 0  ? ?No facility-administered medications prior to visit.  ? ?Past Medical History:  ?Diagnosis Date  ? Anemia 1952  ? post Oromycin for [place]  ? Anisocoria   ? post op  ? Arthritis   ? Autonomic dysfunction   ? GERD (gastroesophageal reflux disease)   ? Glaucoma   ? Dr Bing Plume  ? Hearing loss in right ear   ? Hypercholesteremia   ? Hyperthyroidism   ? s/p RAI  ? LVH (left ventricular hypertrophy)   ? Orthostatic hypotension   ? Parkinson's disease ([place])   ? ?Past Surgical History:  ?Procedure Laterality Date  ? CARDIAC CATHETERIZATION  07/2006  ? Dr.  Eustace Quail, negative  ? CATARACT EXTRACTION, BILATERAL    ? OS retinal surgery for post op floaters  ? COLONOSCOPY    ? negative X 3  ? RAI ablation  02/2008  ? hyperthyroidism  ? SEPTOPLASTY  1970  ? VASECTOMY    ? ?Social History  ? ?Socioeconomic History  ? Marital status: Widowed  ?  Spouse name:  Not on file  ? Number of children: Not on file  ? Years of education: Not on file  ? Highest education level: Not on file  ?Occupational History  ? Occupation: Retired  ?  Comment: personnel  ?Tobacco Use  ? Smoking status: Former  ?  Types: Cigarettes  ?  Quit date: 04/02/1968  ?  Years since quitting: 53.2  ? Smokeless tobacco: Never  ?Vaping Use  ? Vaping Use: Never used  ?Substance and Sexual Activity  ? Alcohol use: Yes  ?  Alcohol/week: 6.0 standard drinks  ?  Types: 6 Cans of beer per week  ? Drug use: No  ? Sexual activity: Not on file  ?Other Topics Concern  ? Not on file  ?Social History Narrative  ? Son Actor of [place]  ? ?Social Determinants of Health  ? ?Financial Resource Strain: Low Risk   ? Difficulty of Paying Living Expenses: Not hard at all  ?Food  Insecurity: No Food Insecurity  ? Worried About Charity fundraiser in the Last Year: Never true  ? Ran Out of Food in the Last Year: Never true  ?Transportation Needs: No Transportation Needs  ? Lack of Transportation (Medical): No  ? Lack of Transportation (Non-Medical): No  ?Physical Activity: Insufficiently Active  ? Days of Exercise per Week: 3 days  ? Minutes of Exercise per Session: 20 min  ?Stress: No Stress Concern Present  ? Feeling of Stress : Not at all  ?Social Connections: Socially Isolated  ? Frequency of Communication with Friends and Family: Once a week  ? Frequency of Social Gatherings with Friends and Family: Once a week  ? Attends Religious Services: Never  ? Active Member of Clubs or Organizations: No  ? Attends Archivist Meetings: Never  ? Marital Status: Widowed  ? ?Family History  ?Problem Relation Age of Onset  ? Osteoporosis Mother   ? Prostate cancer Father   ? Hypertension Sister   ? Hypertension Brother   ? Kidney cancer Brother   ?     possibly lung cancer as well  ? Diabetes Brother   ? Lung cancer Brother   ? Pancreatic cancer Brother   ?     ???  ? Stomach cancer Maternal Grandmother   ? Heart attack Maternal Grandfather 62  ? Stroke Neg Hx   ? ?   ? ?Review of Systems: Pertinent positive and negative review of systems were noted in the above HPI section. All other review of systems were otherwise negative. ? ? ?Physical Exam: ?General: Well developed, well nourished, elderly, very frial, no acute distress ?Head: Normocephalic and atraumatic ?Eyes: Sclerae anicteric, EOMI ?Ears: Normal auditory acuity ?Mouth: Not examined, mask on during Covid-19 pandemic ?Neck: Supple, no masses or thyromegaly ?Lungs: Clear throughout to auscultation ?Heart: Regular rate and rhythm; no murmurs, rubs or bruits ?Abdomen: Soft, non tender and non distended. No masses, hepatosplenomegaly or hernias noted. Normal Bowel sounds ?Rectal: Not done, stool Hemoccult negative ?Musculoskeletal:  Symmetrical with no gross deformities  ?Skin: No lesions on visible extremities ?Pulses:  Normal pulses noted ?Extremities: No clubbing, cyanosis, edema or deformities noted ?Neurological: Alert oriented x 4, generalized weakness, unstable gait ?Cervical Nodes:  No significant cervical adenopathy ?Inguinal Nodes: No significant inguinal adenopathy ?Psychological:  Alert and cooperative. Normal mood and affect ? ? ?Assessment and Recommendations: ? ?Change in bowel habits with intermittent diarrhea.  Recently he has not had episodes of diarrhea.  Chronic constipation for years.  Possible overflow  diarrhea.  Personal history of adenomatous colon polyps not on a surveillance protocol due to age and comorbidities.  Recommended trial of MiraLAX twice daily (not prn) and reserve Dulcolax, glycerin suppositories for refractory constipation.  Given his age and comorbidities he is very high risk for colonoscopy, anesthesia therefore I do not recommend colonoscopy.  If he has persistent bowel habit changes consider CT AP, CT colonoscopy or barium enema however I am concerned he would not tolerate a bowel prep for CT colonoscopy or barium enema. ?Parkinson's disease with dementia.  Follow-up with Drs. Lowne and Tat. ? ? ?cc: [name], DO ?[address] ?[address] ?[place],  [place] [address] ?

## 2021-06-27 NOTE — Patient Instructions (Signed)
Take your Miralax twice a day and not as needed.  ? ?Continue dulcolax and glycerin suppositories.  ? ?The  GI providers would like to encourage you to use [facility] to communicate with providers for non-urgent requests or questions.  Due to long hold times on the telephone, sending your provider a message by [facility] may be a faster and more efficient way to get a response.  Please allow 48 business hours for a response.  Please remember that this is for non-urgent requests.  ? ?Thank you for choosing me and [facility]. ? ?Malcolm T. Dagoberto Ligas., MD., [facility] ? ?

## 2021-07-26 ENCOUNTER — Other Ambulatory Visit: Payer: Self-pay | Admitting: Family Medicine

## 2021-07-26 DIAGNOSIS — E785 Hyperlipidemia, unspecified: Secondary | ICD-10-CM

## 2021-07-26 DIAGNOSIS — E039 Hypothyroidism, unspecified: Secondary | ICD-10-CM

## 2021-08-11 ENCOUNTER — Other Ambulatory Visit: Payer: Self-pay | Admitting: Family Medicine

## 2021-08-11 ENCOUNTER — Other Ambulatory Visit: Payer: Self-pay | Admitting: Neurology

## 2021-08-11 DIAGNOSIS — R32 Unspecified urinary incontinence: Secondary | ICD-10-CM

## 2021-09-04 ENCOUNTER — Other Ambulatory Visit: Payer: Self-pay

## 2021-09-04 ENCOUNTER — Telehealth: Payer: Self-pay | Admitting: Neurology

## 2021-09-04 ENCOUNTER — Other Ambulatory Visit: Payer: Self-pay | Admitting: Neurology

## 2021-09-04 DIAGNOSIS — F0280 Dementia in other diseases classified elsewhere without behavioral disturbance: Secondary | ICD-10-CM

## 2021-09-04 DIAGNOSIS — G20 Parkinson's disease: Secondary | ICD-10-CM

## 2021-09-04 MED ORDER — CARBIDOPA-LEVODOPA 25-100 MG PO TABS: ORAL_TABLET | ORAL | 0 refills | Status: DC

## 2021-09-04 MED ORDER — CARBIDOPA-LEVODOPA ER 50-200 MG PO TBCR: 1.0000 | EXTENDED_RELEASE_TABLET | Freq: Every day | ORAL | 0 refills | Status: DC

## 2021-09-04 NOTE — Telephone Encounter (Signed)
Patient son made patient appt on 10-24-21 to see Dr Tat   He needs refills on all of the medication that Dr Tat gives him   Both Carbidopa levodopa  and quetiapine   [facility] the [facility] on  [address]

## 2021-09-04 NOTE — Telephone Encounter (Signed)
Called patients son [name] and explained that Dr. Carles Collet refilled to the appointment but would not fill it again if he was a no show again

## 2021-09-13 ENCOUNTER — Other Ambulatory Visit: Payer: Self-pay | Admitting: Family Medicine

## 2021-09-13 DIAGNOSIS — R32 Unspecified urinary incontinence: Secondary | ICD-10-CM

## 2021-09-13 DIAGNOSIS — E039 Hypothyroidism, unspecified: Secondary | ICD-10-CM

## 2021-09-13 DIAGNOSIS — E785 Hyperlipidemia, unspecified: Secondary | ICD-10-CM

## 2021-09-13 DIAGNOSIS — R600 Localized edema: Secondary | ICD-10-CM

## 2021-09-14 ENCOUNTER — Other Ambulatory Visit: Payer: Self-pay | Admitting: Neurology

## 2021-09-19 DIAGNOSIS — L821 Other seborrheic keratosis: Secondary | ICD-10-CM | POA: Diagnosis not present

## 2021-09-19 DIAGNOSIS — X32XXXD Exposure to sunlight, subsequent encounter: Secondary | ICD-10-CM | POA: Diagnosis not present

## 2021-09-19 DIAGNOSIS — L570 Actinic keratosis: Secondary | ICD-10-CM | POA: Diagnosis not present

## 2021-09-19 DIAGNOSIS — L820 Inflamed seborrheic keratosis: Secondary | ICD-10-CM | POA: Diagnosis not present

## 2021-09-19 DIAGNOSIS — D225 Melanocytic nevi of trunk: Secondary | ICD-10-CM | POA: Diagnosis not present

## 2021-10-20 NOTE — Progress Notes (Unsigned)
Assessment/Plan:   1.  Parkinsons Disease             -Continue carbidopa/levodopa 2 tablets at 7am/11am/3pm and 1 tablet at 7pm.  Discussed various pillboxes.             -Continue carbidopa/levodopa 50/200 CR at bedtime   -he reported didn't think carbidopa/levodopa helpful.  Discussed that I knew it was working as he has Parkinsons Disease dyskinesia.  However, he doesn't have off time, which is good.  He is very off balance but declines PT             -discussed compliance again, both with medication taking and f/u visits             -Declines therapies again today.  Discussed it would help falls.  Discussed walker at all times.  He is resistant to all of these ideas.  Discussed morbidity and mortality with falls   2.  Probable PDD             -discussed higher levels of care.  Patient needs 24 hours/day care.             -Patient has been found wandering previously.             -Discussed safety in the home.  Son has been trying to keep him from wandering, so reversed the locks on the door.  Worried about this as a Engineer, agricultural.  Discussed putting alarms on the house doors instead.               -would recommend restart quetiapine, 12.5 mg at bedtime for sleep and hallucinations.  he was agreeable by end of visit after discussion with son.     3.  Insomnia             -On mirtazapine by primary care.  I tried to compromise with him to see if we could ask pcp if we could go to 15 mg at bedtime since he is waking up still and didn't initially want to take the quetiapine.  He declines.      Subjective:   William Williamson was seen today in follow up for Parkinsons disease.  My previous records were reviewed prior to todays visit as well as outside records available to me.  Patient accompanied by his son who supplements the history.  Quetiapine started last visit, primarily for sleep, but also for wandering at bedtime.  They admit he isn't taking it at least for last few weeks.  He is  getting up at bedtime and thinks its morning.  He pulls on the door (son locks him in room due to wandering).   Primary care records from December indicate that patient had stopped it and then could not sleep.  He then restarted it.  Primary care also noted that patient continues to decline therapies, and continues to decline walker most times.  Hes had multiple falls.  He states that he "can't use the walker in the little rooms."  Hes not had falls with the walker.  He has L wrist and shoulder pain from falls.   Patient had an appointment with me in February to follow-up to discuss those issues, but unfortunately no-show to that visit.  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  May be contributing to falls.     Current prescribed movement disorder medications: Carbidopa/levodopa  25/100, 2 tablets at 7am/11am/3pm and 1 tablet at 7pm  Carbidopa/levodopa 50/200 CR at bedtime. Quetiapine, 12.5 mg at bedtime (started last visit but he isn't taking)  Current prescribed movement disorder medications: Carbidopa/levodopa 25/100, 2 tablets at 7am/11am/3pm and 1 tablet at 7pm  Carbidopa/levodopa 50/200 at bedtime.  ALLERGIES:   Allergies  Allergen Reactions   Garlic Nausea Only   Niacin     REACTION: flushing    CURRENT MEDICATIONS:  Outpatient Encounter Medications as of 10/24/2021  Medication Sig   carbidopa-levodopa (SINEMET CR) 50-200 MG tablet Take 1 tablet by mouth at bedtime.   carbidopa-levodopa (SINEMET IR) 25-100 MG tablet TAKE 2 TABLETS BY MOUTH AT 7AM 11AM 3PM AND 1 TABLET AT 7PM   Cholecalciferol 25 MCG (1000 UT) CHEW Chew 1 tablet (1,000 Units total) by mouth 2 (two) times daily.   furosemide (LASIX) 20 MG tablet Take 1 tablet by mouth once daily   levothyroxine (SYNTHROID) 100 MCG tablet TAKE 1 TABLET BY MOUTH ONCE DAILY BEFORE BREAKFAST   lisinopril (ZESTRIL) 10 MG tablet Take 1 tablet (10 mg total) by  mouth daily.   mirtazapine (REMERON) 7.5 MG tablet TAKE 1 TABLET BY MOUTH AT BEDTIME   MYRBETRIQ 50 MG TB24 tablet Take 1 tablet by mouth once daily   NONFORMULARY OR COMPOUNDED ITEM Walker  #1   Dx parkinson disease   nystatin (NYSTATIN) powder Apply topically 4 (four) times daily.   QUEtiapine (SEROQUEL) 25 MG tablet TAKE 1/2 (ONE-HALF) TABLET BY MOUTH AT BEDTIME   simvastatin (ZOCOR) 20 MG tablet TAKE 1 TABLET BY MOUTH ONCE DAILY IN THE EVENING   methocarbamol (ROBAXIN) 500 MG tablet Take 1 tablet by mouth 4 times daily (Patient not taking: Reported on 10/24/2021)   No facility-administered encounter medications on file as of 10/24/2021.    Objective:   PHYSICAL EXAMINATION:    VITALS:   Vitals:   10/24/21 0825  BP: (!) 108/58  Pulse: 72  SpO2: 93%  Weight: 163 lb (73.9 kg)  Height: '6\' 1"'$  (1.854 m)       GEN:  The patient appears stated age and is in NAD. HEENT:  Normocephalic, atraumatic.  The mucous membranes are moist. The superficial temporal arteries are without ropiness or tenderness. CV:  RRR Lungs:  CTAB Neck/HEME:  There are no carotid bruits bilaterally.  Neurological examination:  Orientation: The patient is alert and oriented to person and place today.  Intermittently confused. Cranial nerves: There is good facial symmetry with significant facial hypomimia. The speech is fluent and hypophonic. Soft palate rises symmetrically and there is no tongue deviation. Hearing is intact to conversational tone. Sensation: Sensation is intact to light touch throughout Motor: Strength is at least antigravity x4.  Movement examination: Tone: There is normal tone in the UE/LE Abnormal movements:  he has mild truncal dyskinesia; there is rare R>LUE rest tremor Coordination:  There is decremation with RAM's, esp with toe taps on the L Gait and Station: he is forward flexed and off balance even with the walker  I have reviewed and interpreted the following labs  independently    Chemistry      Component Value Date/Time   NA 139 03/16/2021 1203   K 4.1 03/16/2021 1203   CL 102 03/16/2021 1203   CO2 32 03/16/2021 1203   BUN 27 (H) 03/16/2021 1203   CREATININE 1.19 03/16/2021 1203      Component Value Date/Time   CALCIUM 8.9 03/16/2021 1203   ALKPHOS 51 03/16/2021 1203  AST 17 03/16/2021 1203   ALT 6 03/16/2021 1203   BILITOT 0.7 03/16/2021 1203       Lab Results  Component Value Date   WBC 5.7 03/16/2021   HGB 13.0 03/16/2021   HCT 38.6 (L) 03/16/2021   MCV 94.0 03/16/2021   PLT 158.0 03/16/2021    Lab Results  Component Value Date   TSH 1.70 03/16/2021     Total time spent on today's visit was 30 minutes, including both face-to-face time and nonface-to-face time.  Time included that spent on review of records (prior notes available to me/labs/imaging if pertinent), discussing treatment and goals, answering patient's questions and coordinating care.  Cc:  [name], DO

## 2021-10-24 ENCOUNTER — Ambulatory Visit: Payer: PPO | Admitting: Neurology

## 2021-10-24 ENCOUNTER — Encounter: Payer: Self-pay | Admitting: Neurology

## 2021-10-24 DIAGNOSIS — G20 Parkinson's disease: Secondary | ICD-10-CM

## 2021-10-24 MED ORDER — CARBIDOPA-LEVODOPA ER 50-200 MG PO TBCR: 1.0000 | EXTENDED_RELEASE_TABLET | Freq: Every day | ORAL | 1 refills | Status: DC

## 2021-10-24 MED ORDER — QUETIAPINE FUMARATE 25 MG PO TABS: ORAL_TABLET | ORAL | 1 refills | Status: DC

## 2021-10-24 MED ORDER — CARBIDOPA-LEVODOPA 25-100 MG PO TABS: ORAL_TABLET | ORAL | 1 refills | Status: DC

## 2021-10-24 NOTE — Patient Instructions (Signed)
Local and Online Resources for Power over Parkinson's Group June 2023  LOCAL [place] PARKINSON'S GROUPS  Power over Parkinson's Group:   Power Over Parkinson's Patient Education Group will be Wednesday, June 14th-*Hybrid meting*- in person at [place] location and via WEBEX at 2:00 pm.   Upcoming Power over Parkinson's Meetings:  2nd Wednesdays of the month at 2 pm:   June 14th, July 12th Contact Amy Marriott at [email or web address] if interested in participating in this group Parkinson's Care Partners Group:    3rd Mondays, Contact Misty Paladino Atypical Parkinsonian Patient Group:   4th Wednesdays, Contact Misty Paladino If you are interested in participating in these groups with Misty, please contact her directly for how to join those meetings.  Her contact information is [email or web address].    LOCAL EVENTS AND NEW OFFERINGS Dance Class for People with Parkinson's at [employer].  Friday, June 9th at 2 pm.  Led by [employer] DPT students.  Contact [email or web address] to register or with questions. Ice Cream Social at [employer]!  Thursday, June 15th, 5:30-7:00 pm.  RSVP to [email or web address] for attendance and free ice cream. Parkinson's T-shirts for sale!  Designed by a local group member, with funds going to Movement Disorders Fund.  $25.00  Contact Misty to purchase  New PWR! Moves Community Fitness Instructor-Led Class offering at [employer]!  Wednesdays 1-2 pm, starting April 12th.   Contact Susan Laney, Fitness Manager at [employer].  [email or web address]  ONLINE EDUCATION AND SUPPORT Parkinson Foundation:  www.parkinson.org PD Health at Home continues:  Mindfulness Mondays, Wellness Wednesdays, Fitness Fridays  Upcoming Education: Parkinson's 101:  What You and Your Family Should Know.  Wednesday, June 7th at 1:00 pm Register for expert briefings (webinars) at  https://www.parkinson.org/resources-support/online-education/expert-briefings-webinars Please check out their website to sign up for emails and see their full online offerings   [employer] Foundation:  www.michaeljfox.org  Third Thursday Webinars:  On the third Thursday of every month at 12 p.m. ET, join our free live webinars to learn about various aspects of living with Parkinson's disease and our work to speed medical breakthroughs. Upcoming Webinar: REPLAY:  From Low Blood Pressure to Bladder Problems:  A Look at Lesser Known Parkinson's Symptoms.  Thursday, June 15th at 12 noon. Check out additional information on their website to see their full online offerings  [employer] Phinney Foundation:  www.davisphinneyfoundation.org Upcoming Webinar:   Stay tuned Webinar Series:  Living with Parkinson's Meetup.   Third Thursdays each month, 3 pm Care Partner Monthly Meetup.  With Connie Carpenter Phinney.  First Tuesday of each month, 2 pm Check out additional information to Live Well Today on their website  Parkinson and Movement Disorders (PMD) Alliance:  www.pmdalliance.org [employer] Online:  Online Education Events Sign up for emails, which are sent weekly to give you updates on programming and online offerings  Parkinson's Association of [place]:  www.parkinsonassociation.org Information on online support groups, education events, and online exercises including Yoga, Parkinson's exercises and more-LOTS of information on links to PD resources and online events Virtual Support Group through Parkinson's Association of [place]; next one is scheduled for Wednesday, June 7th at 2 pm. (These are typically scheduled for the 1st Wednesday of the month at 2 pm).  Visit website for details. Save the date for "Caring for Parkinson's-Caring for You", 9th Annual Symposium.  In-person event in [place].  September 9th.  More info on registration to come. MOVEMENT AND EXERCISE OPPORTUNITIES PWR!  Moves Classes at [facility].  Wednesdays 10 and 11   am.   Contact Amy Marriott, PT [email or web address] if interested. NEW PWR! Moves Class offering at [employer].  Wednesdays 1-2 pm, starting April 12th.  Contact Susan Laney, Fitness Manager at [employer].  [email or web address] Here is a link to the PWR!Moves classes on Zoom from [place] Parkinson's Foundation - Daily Mon-Sat at 10:00. Via Zoom, FREE and open to all.  There is also a link below via Facebook if you use that platform.  https://www.parkinsonsmi.org/mpf-programs/exercise-and-movement-activities https://www.facebook.com/ParkinsonsMI.org/posts/pwr-moves-exercise-class-parkinson-wellness-recovery-online-with-angee-ludwa-pt-/10156827878021813/  Parkinson's Wellness Recovery (PWR! Moves)  www.pwr4life.org Info on the PWR! Virtual Experience:  You will have access to our expertise through self-assessment, guided plans that start with the PD-specific fundamentals, educational content, tips, Q&A with an expert, and a growing library of PD-specific pre-recorded and live exercise classes of varying types and intensity - both physical and cognitive! If that is not enough, we offer 1:1 wellness consultations (in-person or virtual) to personalize your PWR! Virtual Experience.  Parkinson Foundation Fitness Fridays:  As part of the PD Health @ Home program, this free video series focuses each week on one aspect of fitness designed to support people living with Parkinson's.  These weekly videos highlight the Parkinson Foundation recent fitness guidelines for people with Parkinson's disease. www.parkinson.org/resources-support/online-education/pdhealth#ff Dance for PD website is offering free, live-stream classes throughout the week, as well as links to digital library of classes:  https://danceforparkinsons.org/ Virtual dance and Pilates for Parkinson's classes: Click on the Community Tab> Parkinson's Movement Initiative  Tab.  To register for classes and for more information, visit www.americandancefestival.org and click the "community" tab.  [employer] Parkinson's Cycling Classes  [employer]:  Thursdays @ Noon-Live classes at [employer] (Contact Margaret Hazen at [email or web address] or [phone]) [employer]: Virtual Classes Mondays and Thursdays /Live classes Tuesday, Wednesday and Thursday (contact Marlee at [email or web address]  or [phone]) [place] Steady Boxing Varied levels of classes are offered Tuesdays and Thursdays at [employer].  Stretching with Maria weekly class is also offered for people with Parkinson's To observe a class or for more information, call [phone] or email Hillary Savage at [email or web address] ADDITIONAL SUPPORT AND RESOURCES [employer]:Online Caregiver Education Opportunities:  www.well-springsolutions.org/caregiver-education/caregiver-support-group.  You may also contact Jodi Kolada at [email or web address] or [phone].    [employer] Navigator:  Just1Navigator program, a free service to help individuals and families through the journey of determining care for older adults.  The "Navigator" is a social worker, Nicole Reynolds, who will speak with a prospective client and/or loved ones to provide an assessment of the situation and a set of recommendations for a personalized care plan -- all free of charge, and whether [employer] offers the needed service or not. If the need is not a service we provide, we are well-connected with reputable programs in town that we can refer you to.  www.well-springsolutions.org or to speak with the Navigator, call [phone]. Family Caregiver Programming in June:  [employer], Thursday, June 15th 11-12:30 at [employer], [place].  Call [phone] to register  

## 2021-12-20 ENCOUNTER — Other Ambulatory Visit: Payer: Self-pay | Admitting: Family Medicine

## 2021-12-20 DIAGNOSIS — G4700 Insomnia, unspecified: Secondary | ICD-10-CM

## 2022-01-15 DIAGNOSIS — H401131 Primary open-angle glaucoma, bilateral, mild stage: Secondary | ICD-10-CM | POA: Diagnosis not present

## 2022-02-01 ENCOUNTER — Encounter: Payer: Self-pay | Admitting: Family Medicine

## 2022-02-01 ENCOUNTER — Telehealth: Payer: Self-pay | Admitting: Family Medicine

## 2022-02-01 ENCOUNTER — Ambulatory Visit (INDEPENDENT_AMBULATORY_CARE_PROVIDER_SITE_OTHER): Payer: PPO | Admitting: Family Medicine

## 2022-02-01 VITALS — BP 110/60 | HR 74 | Temp 97.6°F | Resp 16 | Ht 73.0 in

## 2022-02-01 DIAGNOSIS — R443 Hallucinations, unspecified: Secondary | ICD-10-CM | POA: Diagnosis not present

## 2022-02-01 DIAGNOSIS — G4700 Insomnia, unspecified: Secondary | ICD-10-CM

## 2022-02-01 DIAGNOSIS — R195 Other fecal abnormalities: Secondary | ICD-10-CM | POA: Diagnosis not present

## 2022-02-01 DIAGNOSIS — G20B2 Parkinson's disease with dyskinesia, with fluctuations: Secondary | ICD-10-CM

## 2022-02-01 DIAGNOSIS — Z23 Encounter for immunization: Secondary | ICD-10-CM

## 2022-02-01 DIAGNOSIS — E782 Mixed hyperlipidemia: Secondary | ICD-10-CM

## 2022-02-01 DIAGNOSIS — I10 Essential (primary) hypertension: Secondary | ICD-10-CM | POA: Diagnosis not present

## 2022-02-01 DIAGNOSIS — G20B1 Parkinson's disease with dyskinesia, without mention of fluctuations: Secondary | ICD-10-CM | POA: Diagnosis not present

## 2022-02-01 DIAGNOSIS — E039 Hypothyroidism, unspecified: Secondary | ICD-10-CM | POA: Diagnosis not present

## 2022-02-01 LAB — COMPREHENSIVE METABOLIC PANEL
ALT: 5 U/L (ref 0–53)
AST: 19 U/L (ref 0–37)
Albumin: 4.2 g/dL (ref 3.5–5.2)
Alkaline Phosphatase: 46 U/L (ref 39–117)
BUN: 26 mg/dL — ABNORMAL HIGH (ref 6–23)
CO2: 31 mEq/L (ref 19–32)
Calcium: 9 mg/dL (ref 8.4–10.5)
Chloride: 104 mEq/L (ref 96–112)
Creatinine, Ser: 1.21 mg/dL (ref 0.40–1.50)
GFR: 54.08 mL/min — ABNORMAL LOW (ref >60.00)
Glucose, Bld: 86 mg/dL (ref 70–99)
Potassium: 4.5 mEq/L (ref 3.5–5.1)
Sodium: 141 mEq/L (ref 135–145)
Total Bilirubin: 0.5 mg/dL (ref 0.2–1.2)
Total Protein: 6.5 g/dL (ref 6.0–8.3)

## 2022-02-01 LAB — LIPID PANEL
Cholesterol: 108 mg/dL (ref 0–200)
HDL: 38.6 mg/dL — ABNORMAL LOW (ref >39.00)
LDL Cholesterol: 57 mg/dL (ref 0–99)
NonHDL: 69.48
Total CHOL/HDL Ratio: 3
Triglycerides: 60 mg/dL (ref 0.0–149.0)
VLDL: 12 mg/dL (ref 0.0–40.0)

## 2022-02-01 LAB — CBC WITH DIFFERENTIAL/PLATELET
Basophils Absolute: 0 10*3/uL (ref 0.0–0.1)
Basophils Relative: 0.1 % (ref 0.0–3.0)
Eosinophils Absolute: 0 10*3/uL (ref 0.0–0.7)
Eosinophils Relative: 0.3 % (ref 0.0–5.0)
HCT: 37.1 % — ABNORMAL LOW (ref 39.0–52.0)
Hemoglobin: 12.4 g/dL — ABNORMAL LOW (ref 13.0–17.0)
Lymphocytes Relative: 23.1 % (ref 12.0–46.0)
Lymphs Abs: 1.5 10*3/uL (ref 0.7–4.0)
MCHC: 33.5 g/dL (ref 30.0–36.0)
MCV: 94.2 fl (ref 78.0–100.0)
Monocytes Absolute: 0.5 10*3/uL (ref 0.1–1.0)
Monocytes Relative: 7.2 % (ref 3.0–12.0)
Neutro Abs: 4.5 10*3/uL (ref 1.4–7.7)
Neutrophils Relative %: 69.3 % (ref 43.0–77.0)
Platelets: 168 10*3/uL (ref 150.0–400.0)
RBC: 3.94 Mil/uL — ABNORMAL LOW (ref 4.22–5.81)
RDW: 13.1 % (ref 11.5–15.5)
WBC: 6.6 10*3/uL (ref 4.0–10.5)

## 2022-02-01 LAB — VITAMIN D 25 HYDROXY (VIT D DEFICIENCY, FRACTURES): VITD: 58.41 ng/mL (ref 30.00–100.00)

## 2022-02-01 LAB — VITAMIN B12: Vitamin B-12: 609 pg/mL (ref 211–911)

## 2022-02-01 NOTE — Assessment & Plan Note (Signed)
Frequent falls and waking up in the middle of the night He still refuses PT Palliative care will be called in ---- they were not ready to get hospice in allthought I think hospice may be more appropriate

## 2022-02-01 NOTE — Assessment & Plan Note (Signed)
Running low Off bp med

## 2022-02-01 NOTE — Patient Instructions (Signed)
Parkinson's Disease Parkinson's disease is a movement disorder. It is a long-term condition that gets worse over time. Each person with Parkinson's disease is affected differently. This condition limits a person's ability to control movements and move the body normally. The condition can range from mild to severe. Parkinson's disease tends to get worse slowly over several years. What are the causes? Parkinson's disease is caused by a loss of brain cells (neurons) that make a brain chemical called dopamine. Dopamine is needed to control movement. As the condition gets worse, more neurons that make dopamine die. This makes it hard to move or control your movements. The exact cause of the loss of neurons is not known. Genes and the environment may contribute to the cause of Parkinson's disease. What increases the risk? The following factors may make you more likely to develop this condition: Being male. Being age 57 or older. Having a family history of Parkinson's disease. Having had a traumatic brain injury. Having been exposed to toxins, such as pesticides. Having depression. What are the signs or symptoms? Symptoms of this condition can vary. The main symptoms are related to movement. These include: A tremor or shaking while you are resting. You cannot control the shaking. Stiffness in your arms and legs (rigidity). Slowing of movement. You may lose facial expressions and have trouble making small movements that are needed to button clothing or brush your teeth. An abnormal walk. You may walk with short, shuffling steps. Loss of balance and stability when standing. You may sway, fall backward, and have trouble making turns. Other symptoms include: Mental or cognitive changes, including: Depression or anxiety. Having false beliefs (delusions). Seeing, hearing, or feeling things that do not exist (hallucinations). Trouble speaking or swallowing. Changes in bowel or bladder functions,  including constipation, having to go urgently or frequently, or not being able to control your bowel or bladder. Changes in sleep habits, acting out dreams, or trouble sleeping. Depending on the severity of the symptoms, Parkinson's disease may be mild, moderate, or advanced. Parkinson's disease progression is different for everyone. Some people may not progress to the advanced stage. Mild Parkinson's disease involves: Movement problems that do not affect daily activities. Movement problems on one side of the body. Moderate Parkinson's disease involves: Movement problems on both sides of the body. Slowing of movement. Coordination and balance problems. Advanced Parkinson's disease involves: Extreme difficulty walking. Inability to live alone safely. Signs of dementia, such as having trouble remembering things, doing daily tasks such as getting dressed, and problem solving. How is this diagnosed? This condition is diagnosed by a specialist. A diagnosis may be made based on symptoms, your medical history, and a physical exam. You may also have brain imaging tests to check for loss of neurons in the brain. How is this treated? There is no cure for Parkinson's disease. Treatment focuses on managing your symptoms. Treatment may include: Medicines. Everyone responds to medicines differently. Your response may change over time. Work with your health care provider to find the best medicines for you. Speech, occupational, and physical therapy. Deep brain stimulation surgery to reduce tremors and other involuntary movements. Follow these instructions at home: Medicines Take over-the-counter and prescription medicines only as told by your health care provider. Avoid taking medicines that can affect thinking, such as pain or sleeping medicines. Eating and drinking Follow instructions from your health care provider about eating or drinking restrictions. Do not drink alcohol. Activity Ask your health  care provider if it is safe for you  to drive. Do exercises as told by your health care provider or physical therapist. Lifestyle  Install grab bars and railings in your home to prevent falls. Do not use any products that contain nicotine or tobacco. These products include cigarettes, chewing tobacco, and vaping devices, such as e-cigarettes. If you need help quitting, ask your health care provider. Consider joining a support group for people with Parkinson's disease. General instructions Work with your health care provider to know the kind of day-to-day help that you may need and what to do to stay safe. Keep all follow-up visits. This is important. Follow-up visits include any visits with a physical therapist, speech therapist, or occupational therapist. Where to find more information [employer] of Neurological Disorders and Stroke: [employer] [place]: www.parkinson.org Contact a health care provider if: Medicines do not help your symptoms. You are unsteady or have fallen at home. You need more support to function well at home. You have trouble swallowing. You have severe constipation. You are having problems with side effects from your medicines. You feel confused, anxious, depressed, or have hallucinations. Get help right away if you: Are injured after a fall. Cannot swallow without choking. Have chest pain or trouble breathing. Do not feel safe at home. Have thoughts about hurting yourself or others. These symptoms may represent a serious problem that is an emergency. Do not wait to see if the symptoms will go away. Get medical help right away. Call your local emergency services (911 in the [place] Do not drive yourself to the hospital. If you ever feel like you may hurt yourself or others, or have thoughts about taking your own life, get help right away. Go to your nearest emergency department or: Call your local emergency services (911 in the  [place] Call a suicide crisis helpline, such as the [place] at 857-624-9194 or 988 in the [place]. This is open 24 hours a day in the [place] Text the Crisis Text Line at 7373627224 (in the [place].). Summary Parkinson's disease is a long-term condition that gets worse over time. This condition limits your ability to control your movements and move your body normally. There is no cure for Parkinson's disease. Treatment focuses on managing your symptoms. Work with your health care provider to know the kind of day-to-day help that you may need and what to do to stay safe. Keep all follow-up visits, including any visits with a physical therapist, speech therapist, or occupational therapist. This is important. This information is not intended to replace advice given to you by your health care provider. Make sure you discuss any questions you have with your health care provider. Document Revised: 10/12/2020 Document Reviewed: 07/04/2020 Elsevier Patient Education  [place].

## 2022-02-01 NOTE — Assessment & Plan Note (Signed)
Take mirtazapine regularly  Take seroquel 1/2 tab qhs

## 2022-02-01 NOTE — Telephone Encounter (Signed)
Nicki from [facility] called to advise they are restructuring their criteria for Palliative care and wants to know if he meets the criteria. A patient must be near hospice or active cancer diagnosis. Please call [phone] to advise. It's okay to leave a message. She said you can speak to her or Stacy.

## 2022-02-01 NOTE — Assessment & Plan Note (Signed)
?   Cause of loose stools  Check labs

## 2022-02-01 NOTE — Progress Notes (Signed)
Subjective:   By signing my name below, I, William Williamson, attest that this documentation has been prepared under the direction and in the presence of [name], DO. 02/01/2022     Patient ID: William Williamson, male    DOB: [birth year], 88 y.o.   MRN: [id number]  Chief Complaint  Patient presents with   Weakness    Pt states weakness has been going on for a year and has a lot of falls lately. Son states patient passed out on the way here. Son states pt has hallucinations     Weakness Associated symptoms include weakness. Pertinent negatives include no abdominal pain, chest pain, chills, congestion, coughing, fever, headaches, myalgias, nausea, rash or vomiting.   Patient is in today for a office visit. He is present with his son during this visit.   He complains of weakness for the past year. He has a history of parkinson's disease. His symptoms worsens after he eats. He has been falling more often. His son reports he has episodes of feeling weak and passing out. He reports during his episodes he feels weak and has to sit before passing out. His son reports his energy levels while staying alert and awake while in motion is bad. His last fall was this morning while getting in the car for this appointment. He is sleeping earlier than normal. He wakes up in the middle of the night and thinks it is day time and starts getting ready for the day. He was given 25 mg Seroquel to help with his sleep and reports sleeping better while taking it. He has lost weight since his last visit. His son measured his blood pressure during one of his episodes of weakness and reports it measured 133/80. He stopped taking lisinopril regularly for the past 2 years.  Wt Readings from Last 3 Encounters:  10/24/21 163 lb (73.9 kg)  06/27/21 169 lb (76.7 kg)  03/16/21 167 lb (75.8 kg)   BP Readings from Last 3 Encounters:  02/01/22 110/60  10/24/21 (!) 108/58  06/27/21 140/84   Pulse Readings from  Last 3 Encounters:  02/01/22 74  10/24/21 72  06/27/21 68   He complains of diarrhea for the past 5 weeks. He typically has an episode once a day. He stopped taking his laxative daily and found no change in his symptoms.  He reports having swelling in his right leg.  He continues taking 100 mcg levothyroxine daily PO and reports last taking it this morning.  He is interested in receiving his flu vaccine during this visit.    Past Medical History:  Diagnosis Date   Anemia 1952   post Oromycin for [place]   Anisocoria    post op   Arthritis    Autonomic dysfunction    GERD (gastroesophageal reflux disease)    Glaucoma    Dr Bing Plume   Hearing loss in right ear    Hypercholesteremia    Hyperthyroidism    s/p RAI   LVH (left ventricular hypertrophy)    Orthostatic hypotension    Parkinson's disease [facility])     Past Surgical History:  Procedure Laterality Date   CARDIAC CATHETERIZATION  07/2006   Dr.  Eustace Quail, negative   CATARACT EXTRACTION, BILATERAL     OS retinal surgery for post op floaters   COLONOSCOPY     negative X 3   RAI ablation  02/2008   hyperthyroidism   SEPTOPLASTY  1970   VASECTOMY  Family History  Problem Relation Age of Onset   Osteoporosis Mother    Prostate cancer Father    Hypertension Sister    Hypertension Brother    Kidney cancer Brother        possibly lung cancer as well   Diabetes Brother    Lung cancer Brother    Pancreatic cancer Brother        ???   Stomach cancer Maternal Grandmother    Heart attack Maternal Grandfather 62   Stroke Neg Hx     Social History   Socioeconomic History   Marital status: Widowed    Spouse name: Not on file   Number of children: Not on file   Years of education: Not on file   Highest education level: Not on file  Occupational History   Occupation: Retired    Comment: personnel  Tobacco Use   Smoking status: Former    Types: Cigarettes    Quit date: 04/02/1968    Years since quitting:  53.8   Smokeless tobacco: Never  Vaping Use   Vaping Use: Never used  Substance and Sexual Activity   Alcohol use: Yes    Alcohol/week: 6.0 standard drinks of alcohol    Types: 6 Cans of beer per week   Drug use: No   Sexual activity: Not on file  Other Topics Concern   Not on file  Social History Narrative   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   Social Determinants of Health   Financial Resource Strain: Low Risk  (09/13/2020)   Overall Financial Resource Strain (CARDIA)    Difficulty of Paying Living Expenses: Not hard at all  Food Insecurity: No Food Insecurity (01/23/2021)   Hunger Vital Sign    Worried About Running Out of Food in the Last Year: Never true    Ran Out of Food in the Last Year: Never true  Transportation Needs: No Transportation Needs (01/23/2021)   PRAPARE - Hydrologist (Medical): No    Lack of Transportation (Non-Medical): No  Physical Activity: Insufficiently Active (01/23/2021)   Exercise Vital Sign    Days of Exercise per Week: 3 days    Minutes of Exercise per Session: 20 min  Stress: No Stress Concern Present (01/23/2021)   [place]    Feeling of Stress : Not at all  Social Connections: Socially Isolated (01/23/2021)   Social Connection and Isolation Panel [NHANES]    Frequency of Communication with Friends and Family: Once a week    Frequency of Social Gatherings with Friends and Family: Once a week    Attends Religious Services: Never    Marine scientist or Organizations: No    Attends Archivist Meetings: Never    Marital Status: Widowed  Intimate Partner Violence: Not At Risk (01/23/2021)   Humiliation, Afraid, Rape, and Kick questionnaire    Fear of Current or Ex-Partner: No    Emotionally Abused: No    Physically Abused: No    Sexually Abused: No    Outpatient Medications Prior to Visit  Medication Sig Dispense Refill    carbidopa-levodopa (SINEMET CR) 50-200 MG tablet Take 1 tablet by mouth at bedtime. 90 tablet 1   carbidopa-levodopa (SINEMET IR) 25-100 MG tablet TAKE 2 TABLETS BY MOUTH AT 7AM 11AM 3PM AND 1 TABLET AT 7PM 630 tablet 1   Cholecalciferol 25 MCG (1000 UT) CHEW Chew 1 tablet (1,000 Units total) by mouth 2 (  two) times daily.     furosemide (LASIX) 20 MG tablet Take 1 tablet by mouth once daily 90 tablet 0   levothyroxine (SYNTHROID) 100 MCG tablet TAKE 1 TABLET BY MOUTH ONCE DAILY BEFORE BREAKFAST 90 tablet 0   lisinopril (ZESTRIL) 10 MG tablet Take 1 tablet (10 mg total) by mouth daily. 90 tablet 3   mirtazapine (REMERON) 7.5 MG tablet TAKE 1 TABLET BY MOUTH AT BEDTIME 90 tablet 0   MYRBETRIQ 50 MG TB24 tablet Take 1 tablet by mouth once daily 90 tablet 0   NONFORMULARY OR COMPOUNDED ITEM Walker  #1   Dx parkinson disease 1 each 0   nystatin (NYSTATIN) powder Apply topically 4 (four) times daily. 15 g 0   QUEtiapine (SEROQUEL) 25 MG tablet TAKE 1/2 (ONE-HALF) TABLET BY MOUTH AT BEDTIME 45 tablet 1   simvastatin (ZOCOR) 20 MG tablet TAKE 1 TABLET BY MOUTH ONCE DAILY IN THE EVENING 90 tablet 0   methocarbamol (ROBAXIN) 500 MG tablet Take 1 tablet by mouth 4 times daily (Patient not taking: Reported on 10/24/2021) 45 tablet 0   No facility-administered medications prior to visit.    Allergies  Allergen Reactions   Garlic Nausea Only   Niacin     REACTION: flushing    Review of Systems  Constitutional:  Negative for chills, fever and malaise/fatigue.  HENT:  Negative for congestion and hearing loss.   Eyes:  Negative for discharge.  Respiratory:  Negative for cough, sputum production and shortness of breath.   Cardiovascular:  Positive for leg swelling (right ankle). Negative for chest pain and palpitations.  Gastrointestinal:  Positive for diarrhea. Negative for abdominal pain, blood in stool, constipation, heartburn, nausea and vomiting.  Genitourinary:  Negative for dysuria, frequency,  hematuria and urgency.  Musculoskeletal:  Positive for falls. Negative for back pain and myalgias.  Skin:  Negative for rash.  Neurological:  Positive for weakness. Negative for dizziness, sensory change, loss of consciousness and headaches.  Endo/Heme/Allergies:  Negative for environmental allergies. Does not bruise/bleed easily.  Psychiatric/Behavioral:  Negative for depression and suicidal ideas. The patient is not nervous/anxious and does not have insomnia.        Objective:    Physical Exam Vitals and nursing note reviewed.  Constitutional:      General: He is not in acute distress.    Appearance: Normal appearance. He is not ill-appearing.  HENT:     Head: Normocephalic and atraumatic.     Right Ear: External ear normal.     Left Ear: External ear normal.  Eyes:     Extraocular Movements: Extraocular movements intact.     Pupils: Pupils are equal, round, and reactive to light.  Cardiovascular:     Rate and Rhythm: Normal rate and regular rhythm.     Heart sounds: Normal heart sounds. No murmur heard.    No gallop.  Pulmonary:     Effort: Pulmonary effort is normal. No respiratory distress.     Breath sounds: Normal breath sounds. No wheezing or rales.  Skin:    General: Skin is warm and dry.  Neurological:     Mental Status: He is alert and oriented to person, place, and time.  Psychiatric:        Judgment: Judgment normal.     BP 110/60 (BP Location: Left Arm, Patient Position: Sitting, Cuff Size: Normal)   Pulse 74   Temp 97.6 F (36.4 C) (Oral)   Resp 16   Ht '6\' 1"'$  (1.854 m)  BMI 21.51 kg/m  Wt Readings from Last 3 Encounters:  10/24/21 163 lb (73.9 kg)  06/27/21 169 lb (76.7 kg)  03/16/21 167 lb (75.8 kg)    Diabetic Foot Exam - Simple   No data filed    Lab Results  Component Value Date   WBC 5.7 03/16/2021   HGB 13.0 03/16/2021   HCT 38.6 (L) 03/16/2021   PLT 158.0 03/16/2021   GLUCOSE 79 03/16/2021   CHOL 131 04/12/2020   TRIG 63.0  04/12/2020   HDL 46.40 04/12/2020   LDLCALC 72 04/12/2020   ALT 6 03/16/2021   AST 17 03/16/2021   NA 139 03/16/2021   K 4.1 03/16/2021   CL 102 03/16/2021   CREATININE 1.19 03/16/2021   BUN 27 (H) 03/16/2021   CO2 32 03/16/2021   TSH 1.70 03/16/2021   PSA 4.61 (H) 03/16/2021   INR 0.9 RATIO 07/26/2006   HGBA1C 5.9 10/09/2006    Lab Results  Component Value Date   TSH 1.70 03/16/2021   Lab Results  Component Value Date   WBC 5.7 03/16/2021   HGB 13.0 03/16/2021   HCT 38.6 (L) 03/16/2021   MCV 94.0 03/16/2021   PLT 158.0 03/16/2021   Lab Results  Component Value Date   NA 139 03/16/2021   K 4.1 03/16/2021   CO2 32 03/16/2021   GLUCOSE 79 03/16/2021   BUN 27 (H) 03/16/2021   CREATININE 1.19 03/16/2021   BILITOT 0.7 03/16/2021   ALKPHOS 51 03/16/2021   AST 17 03/16/2021   ALT 6 03/16/2021   PROT 6.5 03/16/2021   ALBUMIN 4.2 03/16/2021   CALCIUM 8.9 03/16/2021   GFR 55.52 (L) 03/16/2021   Lab Results  Component Value Date   CHOL 131 04/12/2020   Lab Results  Component Value Date   HDL 46.40 04/12/2020   Lab Results  Component Value Date   LDLCALC 72 04/12/2020   Lab Results  Component Value Date   TRIG 63.0 04/12/2020   Lab Results  Component Value Date   CHOLHDL 3 04/12/2020   Lab Results  Component Value Date   HGBA1C 5.9 10/09/2006       Assessment & Plan:   Problem List Items Addressed This Visit       Unprioritized   Paralysis agitans - Primary   Relevant Orders   Amb Referral to Palliative Care   CBC with Differential/Platelet   Comprehensive metabolic panel   Lipid panel   Thyroid Panel With TSH   Vitamin B12   VITAMIN D 25 Hydroxy (Vit-D Deficiency, Fractures)   Loose stools   Relevant Orders   CBC with Differential/Platelet   Comprehensive metabolic panel   Lipid panel   Thyroid Panel With TSH   Vitamin B12   VITAMIN D 25 Hydroxy (Vit-D Deficiency, Fractures)   Primary hypertension    Running low Off bp med       Parkinson disease    Frequent falls and waking up in the middle of the night He still refuses PT Palliative care will be called in ---- they were not ready to get hospice in allthought I think hospice may be more appropriate        Relevant Orders   Amb Referral to Palliative Care   CBC with Differential/Platelet   Comprehensive metabolic panel   Lipid panel   Thyroid Panel With TSH   Vitamin B12   VITAMIN D 25 Hydroxy (Vit-D Deficiency, Fractures)   Insomnia    Take mirtazapine regularly  Take seroquel 1/2 tab qhs        Hypothyroidism    ? Cause of loose stools  Check labs       Relevant Orders   CBC with Differential/Platelet   Comprehensive metabolic panel   Lipid panel   Thyroid Panel With TSH   Vitamin B12   VITAMIN D 25 Hydroxy (Vit-D Deficiency, Fractures)   Hyperlipidemia    Encourage heart healthy diet such as MIND or DASH diet, increase exercise, avoid trans fats, simple carbohydrates and processed foods, consider a krill or fish or flaxseed oil cap daily.        Hallucinations    Pt is not consistent with meds  Palliative care was consulted       Other Visit Diagnoses     Need for influenza vaccination       Relevant Orders   Flu Vaccine QUAD High Dose(Fluad) (Completed)        No orders of the defined types were placed in this encounter.   IAnn Held, DO, personally preformed the services described in this documentation.  All medical record entries made by the scribe were at my direction and in my presence.  I have reviewed the chart and discharge instructions (if applicable) and agree that the record reflects my personal performance and is accurate and complete. 02/01/2022   I,William Williamson,acting as a scribe for [name], DO.,have documented all relevant documentation on the behalf of [name], DO,as directed by  [name], DO while in the presence of [name], DO.   [name], DO

## 2022-02-01 NOTE — Assessment & Plan Note (Signed)
Pt is not consistent with meds  Palliative care was consulted

## 2022-02-01 NOTE — Assessment & Plan Note (Signed)
Encourage heart healthy diet such as MIND or DASH diet, increase exercise, avoid trans fats, simple carbohydrates and processed foods, consider a krill or fish or flaxseed oil cap daily.  °

## 2022-02-02 LAB — THYROID PANEL WITH TSH
Free Thyroxine Index: 2.4 (ref 1.4–3.8)
T3 Uptake: 34 % (ref 22–35)
T4, Total: 7.1 ug/dL (ref 4.9–10.5)
TSH: 1.78 m[IU]/L (ref 0.40–4.50)

## 2022-02-05 ENCOUNTER — Telehealth: Payer: Self-pay

## 2022-02-05 NOTE — Telephone Encounter (Signed)
Returned call and left vm with above info

## 2022-02-05 NOTE — Telephone Encounter (Signed)
Spoke with patient's son [name] and scheduled a Mychart Palliative Consult for 02/06/22 @ 2:30 PM.   Consent obtained; updated Netsmart, Team List and Epic.

## 2022-02-06 ENCOUNTER — Encounter: Payer: Self-pay | Admitting: Nurse Practitioner

## 2022-02-06 ENCOUNTER — Telehealth: Payer: PPO | Admitting: Nurse Practitioner

## 2022-02-06 DIAGNOSIS — R5381 Other malaise: Secondary | ICD-10-CM

## 2022-02-06 DIAGNOSIS — G20A1 Parkinson's disease without dyskinesia, without mention of fluctuations: Secondary | ICD-10-CM | POA: Diagnosis not present

## 2022-02-06 DIAGNOSIS — Z515 Encounter for palliative care: Secondary | ICD-10-CM | POA: Diagnosis not present

## 2022-02-06 NOTE — Progress Notes (Signed)
[facility] Consult Note Telephone: [phone]  Fax: [phone]   Date of encounter: 02/06/22 9:42 PM PATIENT NAME: William Williamson [address] [address]   [phone] (home)  DOB: 23-Oct-1932 MRN: [id number] PRIMARY CARE PROVIDER:    [name], DO,  [address] [place] [address] [phone]  REFERRING PROVIDER:   [name], DO [address] [place],  [place] [address] [phone]  RESPONSIBLE PARTY:    Contact Information     Name Relation Home Work Mobile   William Williamson [id number]  [phone]       Due to the COVID-19 crisis, this visit was done via telemedicine from my office and it was initiated and consent by this patient and or family.  I connected with son William Williamson with  William Williamson [facility] on 02/06/22 by a video enabled telemedicine application and verified that I am speaking with the correct person.   I discussed the limitations of evaluation and management by telemedicine. The patient expressed understanding and agreed to proceed. Palliative Care was asked to follow this patient by consultation request of  [name], * to address advance care planning and complex medical decision making. This is the initial visit.                             ASSESSMENT AND PLAN / RECOMMENDATIONS:  Symptom Management/Plan: 1. Advance Care Planning;  ongoing discussions, will need to further explore wishes, medical goals including code status  2. Goals of Care: Goals include to maximize quality of life and symptom management. Our advance care planning conversation included a discussion about:    The value and importance of advance care planning  Exploration of personal, cultural or spiritual beliefs that might influence medical decisions  Exploration of goals of care in the event of a sudden injury or illness   Identification and preparation of a healthcare agent  Review and updating or creation of an advance directive document.  3. Debility/unsteady gait secondary to Parkinson disease, progressive with hallucinations. Discussed medications, challenges with [name]  functional abilities. We talked about safety, William Williamson endorses he has cameras in the home. We talked about importance of someone staying with [name], family members helping but also having to work. Will do consult for PC SW/PC RN to schedule visit for further discussions about resources, home environment safety assessment, options for 24 hr care, meals, supportive resources. Will continue ongoing discussions of medical goals, possible option of hospice depending on the decline observed will require further discussions for assessment. Ambulates with walker  06/27/2021 weight 169 lbs 10/24/2021 weight 163 lbs BMI 21.51  02/01/2022 reviewed labs; sodium 141; potassium 4.5; chloride 104; co2 31; calcium 9.0, bun 26, creatinine 1.21, glucose 86, total protein 6.5; albumin 4.2; AST 19; ALT 5 4. Palliative care encounter; Palliative care encounter; Palliative medicine team will continue to support patient, patient's family, and medical team. Visit consisted of counseling and education dealing with the complex and emotionally intense issues of symptom management and palliative care in the setting of serious and potentially life-threatening illness I spent 35 minutes providing this consultation. More than 50% of the time in this consultation was spent in counseling and care coordination.  PPS: 40%  Chief Complaint: Initial palliative consult for complex medical decision making, address goals, manage ongoing symptoms  HISTORY OF PRESENT ILLNESS:  William Williamson is a 88 y.o. year old male  with multiple medical problems including Parkinson disease with hallucinations, hypothyroidism, insomnia, HLD. I connected with [name] and son by  video for telemedicine visit. We talked about purpose of pc visit, past medical history with extensive discussion about the last time [name] was independent, dx of parkinson disease with hallucinations with progression. We talked about his daily routine, ros, functional abilities, cognitive impairment. We talked about [name] with care needs. We talked about expectations, medical goals. We talked about role pc in poc. We talked about PC SW/PC RN for follow up visit for further discussions, needs. [name] son endorses he has to work, has cameras in the home. We talked about long term planning briefly, safety concerns [place]. We talked about appetite, swallowing, speech. Questions answered, therapeutic listening, emotional support provided.   History obtained from review of EMR, discussion with son with  Mr. Anding.  I reviewed available labs, medications, imaging, studies and related documents from the EMR.  Records reviewed and summarized above.   ROS 10 point system reviewed all negative except HPI  Physical Exam: deferred CURRENT PROBLEM LIST:  Patient Active Problem List   Diagnosis Date Noted   Loose stools 03/16/2021   Low back pain radiating to left leg 09/01/2020   Frequent falls 08/12/2020   Depression 05/14/2020   Insomnia 04/12/2020   Hallucinations 04/12/2020   Left elbow pain 04/12/2020   Left wrist pain 04/12/2020   Urinary incontinence 04/12/2020   Benign prostatic hyperplasia with urinary frequency 03/03/2019   Tinea corporis 03/03/2019   Elevated BP without diagnosis of hypertension 12/30/2018   Lower extremity edema 09/09/2018   Otalgia 08/26/2018   Preventative health care 08/16/2016   Hypothyroidism 08/16/2016   Prostate enlargement 08/16/2016   Autonomic dysfunction 02/02/2016   Ingrown toenail 05/17/2015   Right thigh pain 12/24/2014   H. pylori infection 06/21/2014   Primary hypertension 02/08/2014   Hyperlipidemia  02/08/2014   Parkinson disease 12/21/2013   Hearing loss 10/06/2013   Paralysis agitans 07/28/2013   UTI (urinary tract infection) 05/27/2013   Nonspecific abnormal electrocardiogram (ECG) (EKG) 06/14/2011   Anemia, unspecified 06/13/2010   CONSTIPATION, CHRONIC 06/09/2009   Hypothyroidism following radioiodine therapy 07/07/2008   Vitamin D deficiency 01/09/2008   HYPERCHOLESTEROLEMIA 07/10/2007   Arthropathy 07/10/2007   G E R D 10/11/2006   PAST MEDICAL HISTORY:  Active Ambulatory Problems    Diagnosis Date Noted   Hypothyroidism following radioiodine therapy 07/07/2008   Vitamin D deficiency 01/09/2008   HYPERCHOLESTEROLEMIA 07/10/2007   G E R D 10/11/2006   CONSTIPATION, CHRONIC 06/09/2009   Arthropathy 07/10/2007   Anemia, unspecified 06/13/2010   Nonspecific abnormal electrocardiogram (ECG) (EKG) 06/14/2011   UTI (urinary tract infection) 05/27/2013   Paralysis agitans 07/28/2013   Hearing loss 10/06/2013   Parkinson disease 12/21/2013   Primary hypertension 02/08/2014   Hyperlipidemia 02/08/2014   H. pylori infection 06/21/2014   Right thigh pain 12/24/2014   Ingrown toenail 05/17/2015   Autonomic dysfunction 02/02/2016   Preventative health care 08/16/2016   Hypothyroidism 08/16/2016   Prostate enlargement 08/16/2016   Otalgia 08/26/2018   Lower extremity edema 09/09/2018   Elevated BP without diagnosis of hypertension 12/30/2018   Benign prostatic hyperplasia with urinary frequency 03/03/2019   Tinea corporis 03/03/2019   Insomnia 04/12/2020   Hallucinations 04/12/2020   Left elbow pain 04/12/2020   Left wrist pain 04/12/2020  Urinary incontinence 04/12/2020   Depression 05/14/2020   Frequent falls 08/12/2020   Low back pain radiating to left leg 09/01/2020   Loose stools 03/16/2021   Resolved Ambulatory Problems    Diagnosis Date Noted   No Resolved Ambulatory Problems   Past Medical History:  Diagnosis Date   Anemia 1952   Anisocoria     Arthritis    GERD (gastroesophageal reflux disease)    Glaucoma    Hearing loss in right ear    Hypercholesteremia    Hyperthyroidism    LVH (left ventricular hypertrophy)    Orthostatic hypotension    Parkinson's disease    SOCIAL HX:  Social History   Tobacco Use   Smoking status: Former    Types: Cigarettes    Quit date: 04/02/1968    Years since quitting: 53.8   Smokeless tobacco: Never  Substance Use Topics   Alcohol use: Yes    Alcohol/week: 6.0 standard drinks of alcohol    Types: 6 Cans of beer per week   FAMILY HX:  Family History  Problem Relation Age of Onset   Osteoporosis Mother    Prostate cancer Father    Hypertension Sister    Hypertension Brother    Kidney cancer Brother        possibly lung cancer as well   Diabetes Brother    Lung cancer Brother    Pancreatic cancer Brother        ???   Stomach cancer Maternal Grandmother    Heart attack Maternal Grandfather 62   Stroke Neg Hx       ALLERGIES:  Allergies  Allergen Reactions   Garlic Nausea Only   Niacin     REACTION: flushing     PERTINENT MEDICATIONS:  Outpatient Encounter Medications as of 02/06/2022  Medication Sig   carbidopa-levodopa (SINEMET CR) 50-200 MG tablet Take 1 tablet by mouth at bedtime.   carbidopa-levodopa (SINEMET IR) 25-100 MG tablet TAKE 2 TABLETS BY MOUTH AT 7AM 11AM 3PM AND 1 TABLET AT 7PM   Cholecalciferol 25 MCG (1000 UT) CHEW Chew 1 tablet (1,000 Units total) by mouth 2 (two) times daily.   furosemide (LASIX) 20 MG tablet Take 1 tablet by mouth once daily   levothyroxine (SYNTHROID) 100 MCG tablet TAKE 1 TABLET BY MOUTH ONCE DAILY BEFORE BREAKFAST   lisinopril (ZESTRIL) 10 MG tablet Take 1 tablet (10 mg total) by mouth daily.   mirtazapine (REMERON) 7.5 MG tablet TAKE 1 TABLET BY MOUTH AT BEDTIME   MYRBETRIQ 50 MG TB24 tablet Take 1 tablet by mouth once daily   NONFORMULARY OR COMPOUNDED ITEM Walker  #1   Dx parkinson disease   nystatin (NYSTATIN) powder Apply  topically 4 (four) times daily.   QUEtiapine (SEROQUEL) 25 MG tablet TAKE 1/2 (ONE-HALF) TABLET BY MOUTH AT BEDTIME   simvastatin (ZOCOR) 20 MG tablet TAKE 1 TABLET BY MOUTH ONCE DAILY IN THE EVENING   No facility-administered encounter medications on file as of 02/06/2022.   Thank you for the opportunity to participate in the care of Mr. Hawthorne.  The palliative care team will continue to follow. Please call our office at [phone] if we can be of additional assistance.   Ege Muckey Z Tammi Boulier, NP ,

## 2022-02-09 ENCOUNTER — Other Ambulatory Visit: Payer: Self-pay | Admitting: Family Medicine

## 2022-02-09 DIAGNOSIS — E785 Hyperlipidemia, unspecified: Secondary | ICD-10-CM

## 2022-02-09 DIAGNOSIS — E039 Hypothyroidism, unspecified: Secondary | ICD-10-CM

## 2022-02-14 ENCOUNTER — Telehealth: Payer: Self-pay | Admitting: Family

## 2022-02-14 ENCOUNTER — Ambulatory Visit (INDEPENDENT_AMBULATORY_CARE_PROVIDER_SITE_OTHER): Payer: PPO

## 2022-02-14 ENCOUNTER — Encounter: Payer: Self-pay | Admitting: Family Medicine

## 2022-02-14 ENCOUNTER — Telehealth: Payer: Self-pay | Admitting: *Deleted

## 2022-02-14 VITALS — Wt 163.0 lb

## 2022-02-14 DIAGNOSIS — Z638 Other specified problems related to primary support group: Secondary | ICD-10-CM | POA: Diagnosis not present

## 2022-02-14 DIAGNOSIS — Z0000 Encounter for general adult medical examination without abnormal findings: Secondary | ICD-10-CM

## 2022-02-14 NOTE — Progress Notes (Signed)
  Care Coordination   Note   02/14/2022 Name: AUDRIC VENN MRN: [id number] DOB: 1932/12/06  [name] is a 88 y.o. year old male who sees Carollee Herter, Alferd Apa, DO for primary care. I reached out to [name] by phone today to offer care coordination services.  Referral received   Mr. Tumblin was given information about Care Coordination services today including:   The Care Coordination services include support from the care team which includes your Nurse Coordinator, Clinical Social Worker, or Pharmacist.  The Care Coordination team is here to help remove barriers to the health concerns and goals most important to you. Care Coordination services are voluntary, and the patient may decline or stop services at any time by request to their care team member.   Care Coordination Consent Status: Patient agreed to services and verbal consent obtained.   Follow up plan:  Telephone appointment with care coordination team member scheduled for:  02/19/2022  Encounter Outcome:  Pt. Scheduled from referral   Julian Hy, [place] Direct Dial: [phone]

## 2022-02-14 NOTE — Patient Instructions (Signed)
Mr. William Williamson , Thank you for taking time to come for your Medicare Wellness Visit. I appreciate your ongoing commitment to your health goals. Please review the following plan we discussed and let me know if I can assist you in the future.   These are the goals we discussed:  Goals      Blood pressure goal less than 140/90     Check blood pressure 2-3 times per week     Chronic Care Management Pharmacy Care Plan     CARE PLAN ENTRY (see longitudinal plan of care for additional care plan information)  Current Barriers:  Chronic Disease Management support, education, and care coordination needs related to HTN, HLD, Hypothyroidism, Parkinson Disease, Vitamin D Deficiency, BPH, Lower Extremity Edema, Tinea Corporis, Glaucoma, Constipation   Hypertension BP Readings from Last 3 Encounters:  09/01/20 (!) 140/110  08/12/20 (!) 117/54  05/13/20 110/70  Pharmacist Clinical Goal(s): Over the next 90 days, patient will work with PharmD and providers to maintain BP goal <140/90 Current regimen:  Lisinopril '10mg'$  daily  Interventions: Requested patient to check his blood pressure once a week and if experiences symptoms of low blood pressure times per week Patient self care activities - Over the next 90 days, patient will: Check blood pressure weekly, document, and provide at future appointments Monitor for symptoms of low blood pressure (dizziness, weakness) and report to provider.  Ensure daily salt intake < 2300 mg/day  Hyperlipidemia Lab Results  Component Value Date/Time   LDLCALC 72 04/12/2020 07:14 AM  Pharmacist Clinical Goal(s): Over the next 90 days, patient will work with PharmD and providers to maintain LDL goal < 100 Current regimen:  Simvastatin '20mg'$  daily Patient self care activities - Over the next 90 days, patient will: Maintain cholesterol medication regimen.   Hypothyroidism Pharmacist Clinical Goal(s) Over the next 90 days, patient will work with PharmD and providers  to reduce symptoms associated with hypothyroidism Current regimen:  Levothyroxine 18mg daily Patient self care activities - Over the next 90 days, patient will: Take levothyroxine daily in morning Recheck thyroid panel with next labs  Parkinson's Disease:  Pharmacist Clinical Goal(s): Over the next 90 days, patient will work with PharmD and providers to decrease symptoms of Parkinson' disease while minimizing side effects.  Current regimen:  Carbidopa / Levodopa CR 50/'200mg'$  take 1 tablet at bedtime Carbidopa / levodopa IR 25/100 - take 2 tablets at 7am; 11am and 3pm; take 1 tablet at 7pm  Interventions: Discussed benefits of current therapy for Parkinson's  Discussed potential side effects to monitor for.  Patient self care activities - Over the next 90 days, patient will: Continue to follow up with Dr Tat.  Continue current regimen for treatment of Parkinson's Disease  Elevated PSA / BPH with urinary frequency at night: Pharmacist Clinical Goal(s): Over the next 90 days, patient will work with PharmD and providers to decreased frequency of night time bathroom trips and monitor PSA Current regimen:  Myrbetriq ER '50mg'$  daily Patient self care activities - Over the next 90 days, patient will: Continue current therapy for urinary frequency Check PSA yearly.   Health Maintenance  Pharmacist Clinical Goal(s) Over the next 90 days, patient will work with PharmD and providers to complete health maintenance screenings/vaccinations Interventions: Recommended patient receive covid vaccine booster Patient self care activities - Over the next 90 days, patient will: Receive covid vaccine booster    Medication management Pharmacist Clinical Goal(s): Over the next 90 days, patient will work with PharmD and providers to  maintain optimal medication adherence Current pharmacy: [facility] Interventions Comprehensive medication review performed. Continue current medication  management strategy - utilizing daily pill container and watch with alerts / reminder alarms Patient self care activities - Over the next 90 days, patient will: Focus on medication adherence by filling and taking medications appropriately  Take medications as prescribed Report any questions or concerns to PharmD and/or provider(s)  Please see past updates related to this goal by clicking on the "Past Updates" button in the selected goal      Complete TSH lab at next office visit     Complete Vitamin D lab at next office visit     Follow up with your neurologist around 09/2019 or sooner     Maintain current exercise regimen of walking 2 miles daily.     Patient Stated     Maintain current health     Start Lotrimin (butenafine) to help with rash     Take levothyroxine 1st thing in the morning before any other medications or food        This is a list of the screening recommended for you and due dates:  Health Maintenance  Topic Date Due   COVID-19 Vaccine (4 - Pfizer series) 04/27/2020   Tetanus Vaccine  03/17/2031   Pneumonia Vaccine  Completed   Flu Shot  Completed   Zoster (Shingles) Vaccine  Completed   HPV Vaccine  Aged Out    Advanced directives: Please bring a copy of your health care power of attorney and living will to the office at your convenience.   Conditions/risks identified: get palliative assistance   Next appointment: Follow up in one year for your annual wellness visit.   Preventive Care 86 Years and Older, Male  Preventive care refers to lifestyle choices and visits with your health care provider that can promote health and wellness. What does preventive care include? A yearly physical exam. This is also called an annual well check. Dental exams once or twice a year. Routine eye exams. Ask your health care provider how often you should have your eyes checked. Personal lifestyle choices, including: Daily care of your teeth and gums. Regular physical  activity. Eating a healthy diet. Avoiding tobacco and drug use. Limiting alcohol use. Practicing safe sex. Taking low doses of aspirin every day. Taking vitamin and mineral supplements as recommended by your health care provider. What happens during an annual well check? The services and screenings done by your health care provider during your annual well check will depend on your age, overall health, lifestyle risk factors, and family history of disease. Counseling  Your health care provider may ask you questions about your: Alcohol use. Tobacco use. Drug use. Emotional well-being. Home and relationship well-being. Sexual activity. Eating habits. History of falls. Memory and ability to understand (cognition). Work and work Statistician. Screening  You may have the following tests or measurements: Height, weight, and BMI. Blood pressure. Lipid and cholesterol levels. These may be checked every 5 years, or more frequently if you are over 34 years old. Skin check. Lung cancer screening. You may have this screening every year starting at age 34 if you have a 30-pack-year history of smoking and currently smoke or have quit within the past 15 years. Fecal occult blood test (FOBT) of the stool. You may have this test every year starting at age 17. Flexible sigmoidoscopy or colonoscopy. You may have a sigmoidoscopy every 5 years or a colonoscopy every 10 years starting at  age 27. Prostate cancer screening. Recommendations will vary depending on your family history and other risks. Hepatitis C blood test. Hepatitis B blood test. Sexually transmitted disease (STD) testing. Diabetes screening. This is done by checking your blood sugar (glucose) after you have not eaten for a while (fasting). You may have this done every 1-3 years. Abdominal aortic aneurysm (AAA) screening. You may need this if you are a current or former smoker. Osteoporosis. You may be screened starting at age 71 if you are  at high risk. Talk with your health care provider about your test results, treatment options, and if necessary, the need for more tests. Vaccines  Your health care provider may recommend certain vaccines, such as: Influenza vaccine. This is recommended every year. Tetanus, diphtheria, and acellular pertussis (Tdap, Td) vaccine. You may need a Td booster every 10 years. Zoster vaccine. You may need this after age 74. Pneumococcal 13-valent conjugate (PCV13) vaccine. One dose is recommended after age 56. Pneumococcal polysaccharide (PPSV23) vaccine. One dose is recommended after age 61. Talk to your health care provider about which screenings and vaccines you need and how often you need them. This information is not intended to replace advice given to you by your health care provider. Make sure you discuss any questions you have with your health care provider. Document Released: 04/15/2015 Document Revised: 12/07/2015 Document Reviewed: 01/18/2015 Elsevier Interactive Patient Education  2017 [place] Prevention in the Home Falls can cause injuries. They can happen to people of all ages. There are many things you can do to make your home safe and to help prevent falls. What can I do on the outside of my home? Regularly fix the edges of walkways and driveways and fix any cracks. Remove anything that might make you trip as you walk through a door, such as a raised step or threshold. Trim any bushes or trees on the path to your home. Use bright outdoor lighting. Clear any walking paths of anything that might make someone trip, such as rocks or tools. Regularly check to see if handrails are loose or broken. Make sure that both sides of any steps have handrails. Any raised decks and porches should have guardrails on the edges. Have any leaves, snow, or ice cleared regularly. Use sand or salt on walking paths during winter. Clean up any spills in your garage right away. This includes oil  or grease spills. What can I do in the bathroom? Use night lights. Install grab bars by the toilet and in the tub and shower. Do not use towel bars as grab bars. Use non-skid mats or decals in the tub or shower. If you need to sit down in the shower, use a plastic, non-slip stool. Keep the floor dry. Clean up any water that spills on the floor as soon as it happens. Remove soap buildup in the tub or shower regularly. Attach bath mats securely with double-sided non-slip rug tape. Do not have throw rugs and other things on the floor that can make you trip. What can I do in the bedroom? Use night lights. Make sure that you have a light by your bed that is easy to reach. Do not use any sheets or blankets that are too big for your bed. They should not hang down onto the floor. Have a firm chair that has side arms. You can use this for support while you get dressed. Do not have throw rugs and other things on the floor that can make  you trip. What can I do in the kitchen? Clean up any spills right away. Avoid walking on wet floors. Keep items that you use a lot in easy-to-reach places. If you need to reach something above you, use a strong step stool that has a grab bar. Keep electrical cords out of the way. Do not use floor polish or wax that makes floors slippery. If you must use wax, use non-skid floor wax. Do not have throw rugs and other things on the floor that can make you trip. What can I do with my stairs? Do not leave any items on the stairs. Make sure that there are handrails on both sides of the stairs and use them. Fix handrails that are broken or loose. Make sure that handrails are as long as the stairways. Check any carpeting to make sure that it is firmly attached to the stairs. Fix any carpet that is loose or worn. Avoid having throw rugs at the top or bottom of the stairs. If you do have throw rugs, attach them to the floor with carpet tape. Make sure that you have a light  switch at the top of the stairs and the bottom of the stairs. If you do not have them, ask someone to add them for you. What else can I do to help prevent falls? Wear shoes that: Do not have high heels. Have rubber bottoms. Are comfortable and fit you well. Are closed at the toe. Do not wear sandals. If you use a stepladder: Make sure that it is fully opened. Do not climb a closed stepladder. Make sure that both sides of the stepladder are locked into place. Ask someone to hold it for you, if possible. Clearly mark and make sure that you can see: Any grab bars or handrails. First and last steps. Where the edge of each step is. Use tools that help you move around (mobility aids) if they are needed. These include: Canes. Walkers. Scooters. Crutches. Turn on the lights when you go into a dark area. Replace any light bulbs as soon as they burn out. Set up your furniture so you have a clear path. Avoid moving your furniture around. If any of your floors are uneven, fix them. If there are any pets around you, be aware of where they are. Review your medicines with your doctor. Some medicines can make you feel dizzy. This can increase your chance of falling. Ask your doctor what other things that you can do to help prevent falls. This information is not intended to replace advice given to you by your health care provider. Make sure you discuss any questions you have with your health care provider. Document Released: 01/13/2009 Document Revised: 08/25/2015 Document Reviewed: 04/23/2014 Elsevier Interactive Patient Education  2017 [employer].

## 2022-02-14 NOTE — Progress Notes (Addendum)
I connected with  William Williamson on 02/14/22 by a audio enabled telemedicine application and verified that I am speaking with the correct person using two identifiers. With William Williamson   Patient Location: Home  Provider Location: Home Office  I discussed the limitations of evaluation and management by telemedicine. The patient expressed understanding and agreed to proceed.   Subjective:   William Williamson is a 88 y.o. male who presents for Medicare Annual/Subsequent preventive examination.  Review of Systems     Cardiac Risk Factors include: advanced age (>45mn, >>52women);dyslipidemia;male gender;hypertension;sedentary lifestyle     Objective:    Today's Vitals   02/14/22 0911  Weight: 163 lb (73.9 kg)   Body mass index is 21.51 kg/m.     02/14/2022    9:23 AM 10/24/2021    8:22 AM 02/10/2021    9:27 AM 01/23/2021   11:51 AM 11/18/2020    8:17 AM 05/11/2020    9:43 AM 10/14/2019    9:09 AM  Advanced Directives  Does Patient Have a Medical Advance Directive? Yes Yes Yes Yes No;Yes Yes Yes  Type of AParamedicof [place]thensLiving will [place] will;Out of facility DNR (pink MOST or yellow form) [place]Living [place];Out of facility DNR (pink MOST or yellow form) Living will;Healthcare Power of Attorney Living will [place]   Copy of HA[place]in Chart? No - copy requested   No - copy requested       Current Medications (verified) Outpatient Encounter Medications as of 02/14/2022  Medication Sig   carbidopa-levodopa (SINEMET CR) 50-200 MG tablet Take 1 tablet by mouth at bedtime.   carbidopa-levodopa (SINEMET IR) 25-100 MG tablet TAKE 2 TABLETS BY MOUTH AT 7AM 11AM 3PM AND 1 TABLET AT 7PM   Cholecalciferol 25 MCG (1000 UT) CHEW Chew 1 tablet (1,000 Units total) by mouth 2 (two) times daily.   furosemide (LASIX) 20 MG tablet Take 1 tablet by mouth once  daily   levothyroxine (SYNTHROID) 100 MCG tablet TAKE 1 TABLET BY MOUTH ONCE DAILY BEFORE BREAKFAST   mirtazapine (REMERON) 7.5 MG tablet TAKE 1 TABLET BY MOUTH AT BEDTIME   MYRBETRIQ 50 MG TB24 tablet Take 1 tablet by mouth once daily   NONFORMULARY OR COMPOUNDED ITEM Walker  #1   Dx parkinson disease   nystatin (NYSTATIN) powder Apply topically 4 (four) times daily.   QUEtiapine (SEROQUEL) 25 MG tablet TAKE 1/2 (ONE-HALF) TABLET BY MOUTH AT BEDTIME   simvastatin (ZOCOR) 20 MG tablet TAKE 1 TABLET BY MOUTH ONCE DAILY IN THE EVENING   lisinopril (ZESTRIL) 10 MG tablet Take 1 tablet (10 mg total) by mouth daily. (Patient not taking: Reported on 02/14/2022)   No facility-administered encounter medications on file as of 02/14/2022.    Allergies (verified) Garlic and Niacin   History: Past Medical History:  Diagnosis Date   Anemia 1952   post Oromycin for [place]   Anisocoria    post op   Arthritis    Autonomic dysfunction    GERD (gastroesophageal reflux disease)    Glaucoma    Dr DBing Plume  Hearing loss in right ear    Hypercholesteremia    Hyperthyroidism    s/p RAI   LVH (left ventricular hypertrophy)    Orthostatic hypotension    Parkinson's disease    Past Surgical History:  Procedure Laterality Date   CARDIAC CATHETERIZATION  07/2006   Dr.  BEustace Quail negative  CATARACT EXTRACTION, BILATERAL     OS retinal surgery for post op floaters   COLONOSCOPY     negative X 3   RAI ablation  02/2008   hyperthyroidism   SEPTOPLASTY  1970   VASECTOMY     Family History  Problem Relation Age of Onset   Osteoporosis Mother    Prostate cancer Father    Hypertension Sister    Hypertension Brother    Kidney cancer Brother        possibly lung cancer as well   Diabetes Brother    Lung cancer Brother    Pancreatic cancer Brother        ???   Stomach cancer Maternal Grandmother    Heart attack Maternal Grandfather 62   Stroke Neg Hx    Social History   Socioeconomic  History   Marital status: Widowed    Spouse name: Not on file   Number of children: Not on file   Years of education: Not on file   Highest education level: Not on file  Occupational History   Occupation: Retired    Comment: personnel  Tobacco Use   Smoking status: Former    Types: Cigarettes    Quit date: 04/02/1968    Years since quitting: 53.9   Smokeless tobacco: Never  Vaping Use   Vaping Use: Never used  Substance and Sexual Activity   Alcohol use: Yes    Alcohol/week: 6.0 standard drinks of alcohol    Types: 6 Cans of beer per week   Drug use: No   Sexual activity: Not on file  Other Topics Concern   Not on file  Social History Narrative   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   Social Determinants of Health   Financial Resource Strain: Low Risk  (02/14/2022)   Overall Financial Resource Strain (CARDIA)    Difficulty of Paying Living Expenses: Not hard at all  Food Insecurity: No Food Insecurity (02/14/2022)   Hunger Vital Sign    Worried About Running Out of Food in the Last Year: Never true    Ran Out of Food in the Last Year: Never true  Transportation Needs: No Transportation Needs (02/14/2022)   PRAPARE - Hydrologist (Medical): No    Lack of Transportation (Non-Medical): No  Physical Activity: Inactive (02/14/2022)   Exercise Vital Sign    Days of Exercise per Week: 0 days    Minutes of Exercise per Session: 0 min  Stress: No Stress Concern Present (02/14/2022)   [place]    Feeling of Stress : Not at all  Social Connections: Socially Isolated (02/14/2022)   Social Connection and Isolation Panel [NHANES]    Frequency of Communication with Friends and Family: Once a week    Frequency of Social Gatherings with Friends and Family: Never    Attends Religious Services: Never    Marine scientist or Organizations: No    Attends Archivist Meetings: Never     Marital Status: Widowed    Tobacco Counseling Counseling given: Not Answered   Clinical Intake:  Pre-visit preparation completed: Yes  Pain : No/denies pain     BMI - recorded: 21.51 Nutritional Status: BMI of 19-24  Normal Nutritional Risks: None Diabetes: No  How often do you need to have someone help you when you read instructions, pamphlets, or other written materials from your doctor or pharmacy?: 1 - Never  Diabetic?no  Interpreter  Needed?: No  Information entered by :: William Rakes, LPN   Activities of Daily Living    02/14/2022    9:24 AM  In your present state of health, do you have any difficulty performing the following activities:  Hearing? 1  Comment HOH wears a hearing aid  Vision? 0  Difficulty concentrating or making decisions? 0  Walking or climbing stairs? 1  Comment passing out spells  Dressing or bathing? 0  Doing errands, shopping? 0  Preparing Food and eating ? Y  Using the Toilet? Y  In the past six months, have you accidently leaked urine? N  Comment at times difficult  Do you have problems with loss of bowel control? N  Managing your Medications? N  Managing your Finances? N  Housekeeping or managing your Housekeeping? Y    Patient Care Team: Carollee Herter, Alferd Apa, DO as PCP - General (Family Medicine) Dorothy Spark, MD as PCP - Cardiology (Cardiology) Allyn Kenner, MD as Consulting Physician (Dermatology) Tat, Eustace Quail, DO as Consulting Physician (Neurology) Festus Aloe, MD as Consulting Physician (Urology) Dorothy Spark, MD as Consulting Physician (Cardiology) Daneen Schick, DDS as Consulting Physician (Dentistry) Cherre Robins, RPH-CPP (Pharmacist)  Indicate any recent Medical Services you may have received from other than [facility] providers in the past year (date may be approximate).     Assessment:   This is a routine wellness examination for William Williamson.  Hearing/Vision screen Hearing Screening - Comments::  Wears hearing aids HOH  Vision Screening - Comments:: Pt follows up with Dr Lu Duffel for annul eye exams   Dietary issues and exercise activities discussed: Current Exercise Habits: The patient does not participate in regular exercise at present   Goals Addressed             This Visit's Progress    Patient Stated       Pt wants to follow up with Palliative care      Patient Stated         Depression Screen    02/14/2022    9:18 AM 03/16/2021   11:31 AM 01/23/2021   12:00 PM 10/14/2020    8:37 AM 03/09/2020    2:35 PM 08/22/2017   10:07 AM 08/19/2017   10:31 AM  PHQ 2/9 Scores  PHQ - 2 Score 0 0 0 1 1 0 0  PHQ- 9 Score    4       Fall Risk    02/14/2022    9:24 AM 02/01/2022    9:18 AM 10/24/2021    8:22 AM 03/16/2021   11:30 AM 02/10/2021    9:27 AM  Fall Risk   Falls in the past year? '1 1 1 1 1  '$ Number falls in past yr: '1 1 1 1 1  '$ Injury with Fall? 0 1 0 1 1  Risk for fall due to : Impaired balance/gait;Impaired mobility;History of fall(s);Impaired vision Impaired balance/gait;Impaired mobility;History of fall(s);Impaired vision;Mental status change  History of fall(s);Impaired balance/gait   Follow up Falls prevention discussed Falls evaluation completed  Education provided;Falls evaluation completed     FALL RISK PREVENTION PERTAINING TO THE HOME:  Any stairs in or around the home? Yes  If so, are there any without handrails? No  Home free of loose throw rugs in walkways, pet beds, electrical cords, etc? Yes  Adequate lighting in your home to reduce risk of falls? Yes   ASSISTIVE DEVICES UTILIZED TO PREVENT FALLS:  Life alert? Yes  apple watch  Use of a cane, walker or w/c? Yes  Grab bars in the bathroom? No  Shower chair or bench in shower? Yes  Elevated toilet seat or a handicapped toilet? No   TIMED UP AND GO:  Was the test performed? No .   Cognitive Function:    08/19/2017   10:42 AM 08/16/2016   10:01 AM  MMSE - Mini Mental State Exam   Orientation to time 5 5  Orientation to Place 5 5  Registration 3 3  Attention/ Calculation 5 5  Recall 3 3  Language- name 2 objects 2 2  Language- repeat 1 1  Language- follow 3 step command 3 3  Language- read & follow direction 1 1  Write a sentence 1 1  Copy design 1 1  Total score 30 30        02/14/2022    9:27 AM  6CIT Screen  What Year? 0 points  What month? 3 points  What time? 0 points  Count back from 20 0 points  Months in reverse 0 points  Repeat phrase 6 points  Total Score 9 points    Immunizations Immunization History  Administered Date(s) Administered   Fluad Quad(high Dose 65+) 12/30/2018, 02/01/2022   Influenza Split 01/09/2011, 01/15/2012, 02/22/2021   Influenza Whole 03/11/2007, 01/09/2008, 01/26/2009, 01/23/2010   Influenza, High Dose Seasonal PF 01/22/2013, 12/23/2014, 01/17/2016   Influenza,inj,Quad PF,6+ Mos 12/21/2013   Influenza-Unspecified 12/31/2017, 03/02/2020   PFIZER(Purple Top)SARS-COV-2 Vaccination 04/16/2019, 05/17/2019, 03/02/2020   Pneumococcal Conjugate-13 06/21/2014   Pneumococcal Polysaccharide-23 06/19/2013   Td 07/07/2008, 03/16/2021   Zoster Recombinat (Shingrix) 12/26/2017, 04/09/2018   Zoster, Live 06/21/2014    TDAP status: Up to date  Flu Vaccine status: Up to date  Pneumococcal vaccine status: Up to date  Covid-19 vaccine status: Completed vaccines  Qualifies for Shingles Vaccine? Yes   Zostavax completed Yes   Shingrix Completed?: Yes  Screening Tests Health Maintenance  Topic Date Due   COVID-19 Vaccine (4 - Pfizer series) 04/27/2020   TETANUS/TDAP  03/17/2031   Pneumonia Vaccine 60+ Years old  Completed   INFLUENZA VACCINE  Completed   Zoster Vaccines- Shingrix  Completed   HPV VACCINES  Aged Out    Health Maintenance  Health Maintenance Due  Topic Date Due   COVID-19 Vaccine (4 - Pfizer series) 04/27/2020    Colorectal cancer screening: No longer required.    Additional  Screening:  Vision Screening: Recommended annual ophthalmology exams for early detection of glaucoma and other disorders of the eye. Is the patient up to date with their annual eye exam?  Yes  Who is the provider or what is the name of the office in which the patient attends annual eye exams? Dr Lu Duffel If pt is not established with a provider, would they like to be referred to a provider to establish care? No .   Dental Screening: Recommended annual dental exams for proper oral hygiene  Community Resource Referral / Chronic Care Management: CRR required this visit?  No   CCM required this visit?  No      Plan:     I have personally reviewed and noted the following in the patient's chart:   Medical and social history Use of alcohol, tobacco or illicit drugs  Current medications and supplements including opioid prescriptions. Patient is not currently taking opioid prescriptions. Functional ability and status Nutritional status Physical activity Advanced directives List of other physicians Hospitalizations, surgeries, and ER visits in previous 12 months Vitals Screenings  to include cognitive, depression, and falls Referrals and appointments  In addition, I have reviewed and discussed with patient certain preventive protocols, quality metrics, and best practice recommendations. A written personalized care plan for preventive services as well as general preventive health recommendations were provided to patient.     William Brace, LPN   01/64/2903   Nurse Notes: Pt Son William Williamson is very concerned stating he has not received any information from the palliative team setup to assist him with his Father. Pt stated that Mr Gaster passed out twice on yesterday. Pt stated he expressed that information at last visit.I also placed a referral for any community assistance to reach out to Pt and Family please advise

## 2022-02-14 NOTE — Telephone Encounter (Signed)
His medicare wellness visit came to me because you were out of the office today. Nurse made the following note, which I will defer to you since you know him best:  Nurse Notes: "Pt Son William Williamson is very concerned stating he has not received any information from the palliative team setup to assist him with his Father. Pt stated that [name] passed out twice on yesterday. Pt stated he expressed that information at last visit.I also placed a referral for any community assistance to reach out to Pt and Family please advise."

## 2022-02-15 NOTE — Telephone Encounter (Signed)
Attempted to reach pt's son [name].  No answer- left detailed message that we will look into the status of the Palliative care consultation. Also advised him that if he feels that his father's health status has declined (not clear if the passing out is new or chronic) that he should bring his dad to the ER.  Call our office with any additional questions.  In the meantime advised that Dr. Etter Sjogren is out of the office until Monday and we will forward her this information.  Heather, Please check status of palliative care referral and contact son to update.

## 2022-02-15 NOTE — Telephone Encounter (Signed)
Could you give me update? The referral looks closed

## 2022-02-19 ENCOUNTER — Ambulatory Visit: Payer: Self-pay | Admitting: Licensed Clinical Social Worker

## 2022-02-19 NOTE — Patient Outreach (Signed)
  Care Coordination   02/19/2022 Name: William Williamson MRN: [id number] DOB: 06/13/1932   Care Coordination Outreach Attempts:  An unsuccessful telephone outreach was attempted today to offer the patient information about available care coordination services as a benefit of their health plan.   Follow Up Plan:  Additional outreach attempts will be made to offer the patient care coordination information and services.   Encounter Outcome:  No Answer  Care Coordination Interventions Activated:  No   Care Coordination Interventions:  No, not indicated    Milus Height, BSW , MSW, [place] Social Worker [facility] Care Management  [phone]

## 2022-02-20 NOTE — Telephone Encounter (Signed)
Palliative care called. Spoke with a doctor that was going to have someone one call me back

## 2022-02-23 ENCOUNTER — Other Ambulatory Visit: Payer: Self-pay | Admitting: Family Medicine

## 2022-02-23 DIAGNOSIS — R32 Unspecified urinary incontinence: Secondary | ICD-10-CM

## 2022-02-26 ENCOUNTER — Encounter: Payer: Self-pay | Admitting: Family Medicine

## 2022-02-26 ENCOUNTER — Telehealth: Payer: Self-pay

## 2022-02-26 NOTE — Telephone Encounter (Signed)
(  11:25 pm) PC SW scheduled a follow-up visit with patient. RN/SW team to visit patient on 02/28/22 @ 11am at his home. Visit was scheduled with patient's son-Kris, who will also be present.

## 2022-02-27 ENCOUNTER — Telehealth: Payer: Self-pay | Admitting: *Deleted

## 2022-02-27 NOTE — Progress Notes (Signed)
  Care Coordination Note  02/27/2022 Name: BRITTON BERA MRN: [id number] DOB: 16-Oct-1932  [name] is a 88 y.o. year old male who is a primary care patient of [name], DO and is actively engaged with the care management team. I reached out to [name] by phone today to assist with re-scheduling an initial visit with the BSW  Follow up plan: Per Luvenia Redden, home health nurse and social worker are coming to home tomorrow - no reschedule needed at this time - son appreciative and contact info given if services needed in the future   Julian Hy, [place] Direct Dial: [phone]

## 2022-02-27 NOTE — Telephone Encounter (Signed)
Any updates from the Palliative Care team?  Have they made contact with the patient/family to start services?

## 2022-02-27 NOTE — Telephone Encounter (Signed)
His first palliative care appt was 02/06/22 and his next one is tomorrow

## 2022-02-28 ENCOUNTER — Other Ambulatory Visit: Payer: PPO

## 2022-02-28 ENCOUNTER — Other Ambulatory Visit: Payer: PPO | Admitting: *Deleted

## 2022-02-28 VITALS — BP 136/82 | HR 69 | Temp 97.9°F | Resp 18

## 2022-02-28 DIAGNOSIS — Z515 Encounter for palliative care: Secondary | ICD-10-CM

## 2022-03-01 IMAGING — DX DG ELBOW COMPLETE 3+V*L*
4 series · 4 of 4 positions shown · non-contrast
Comparison: None.

CLINICAL DATA: Left elbow pain status post fall in Tuesday July, 2019.

EXAM:
LEFT ELBOW - COMPLETE 3+ VIEW

[elbow ap]
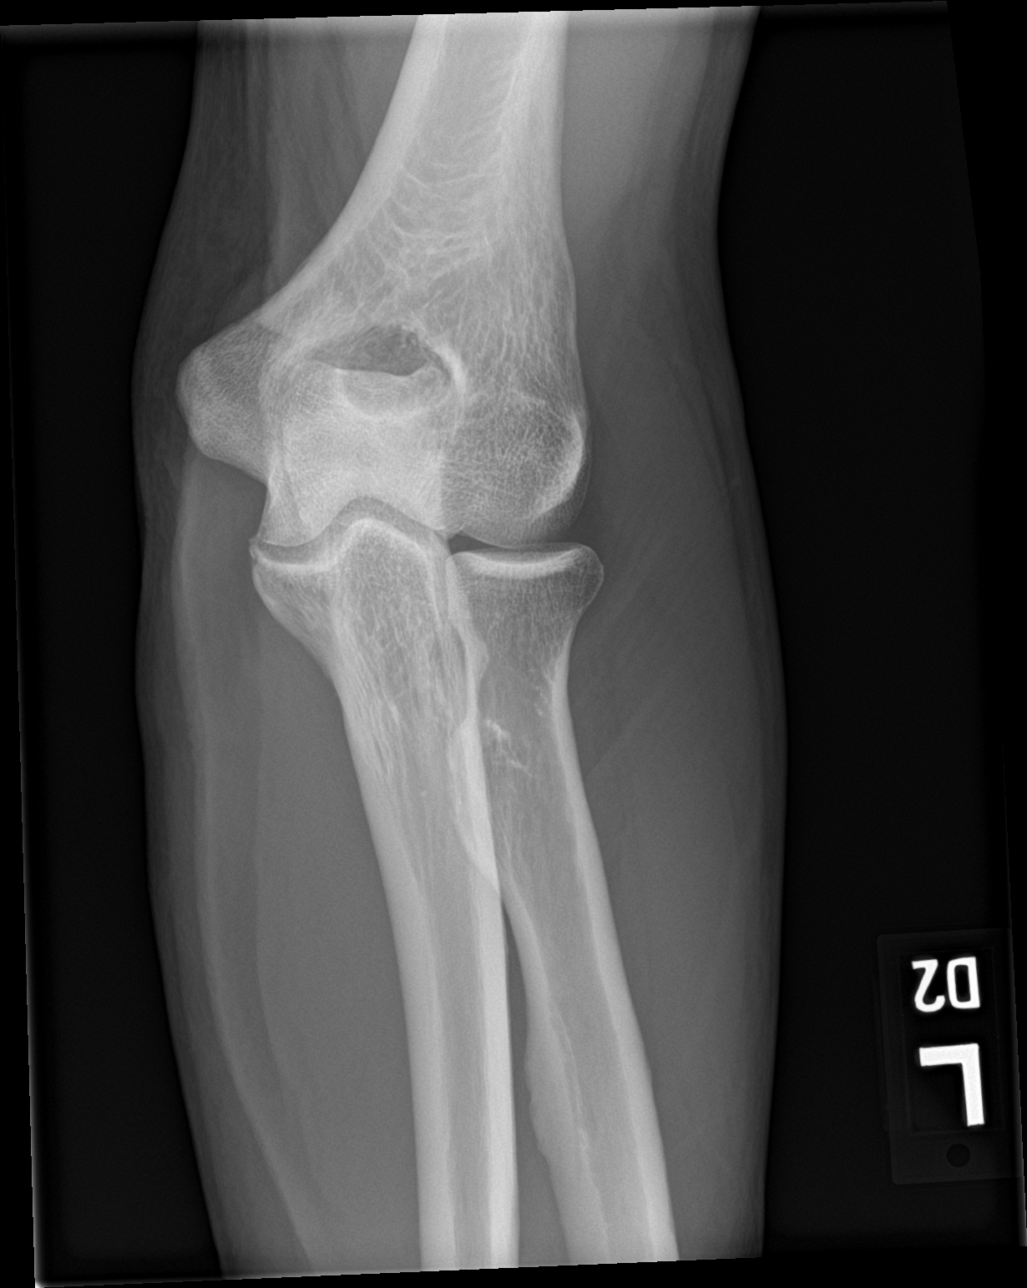

[elbow obl (1 of 2)]
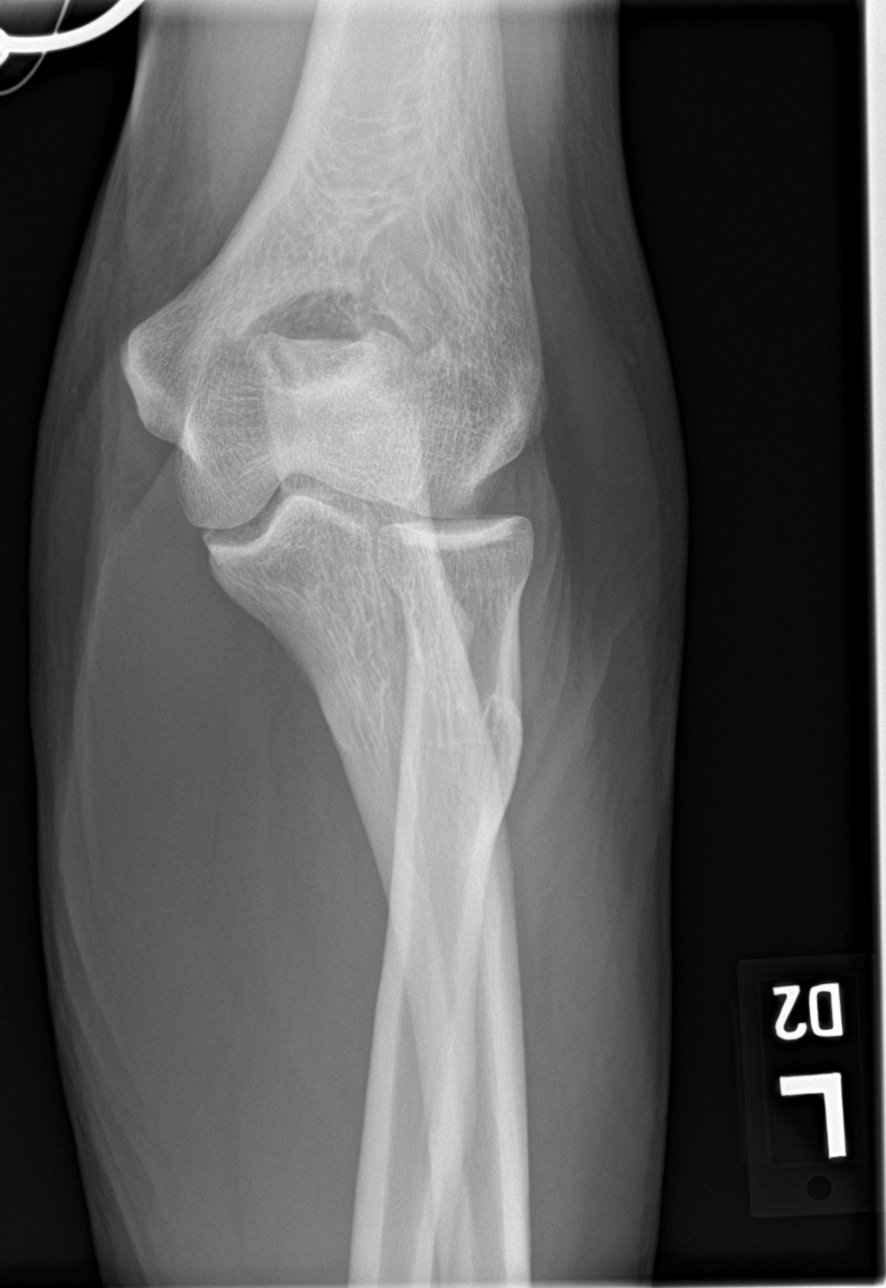

[elbow lat]
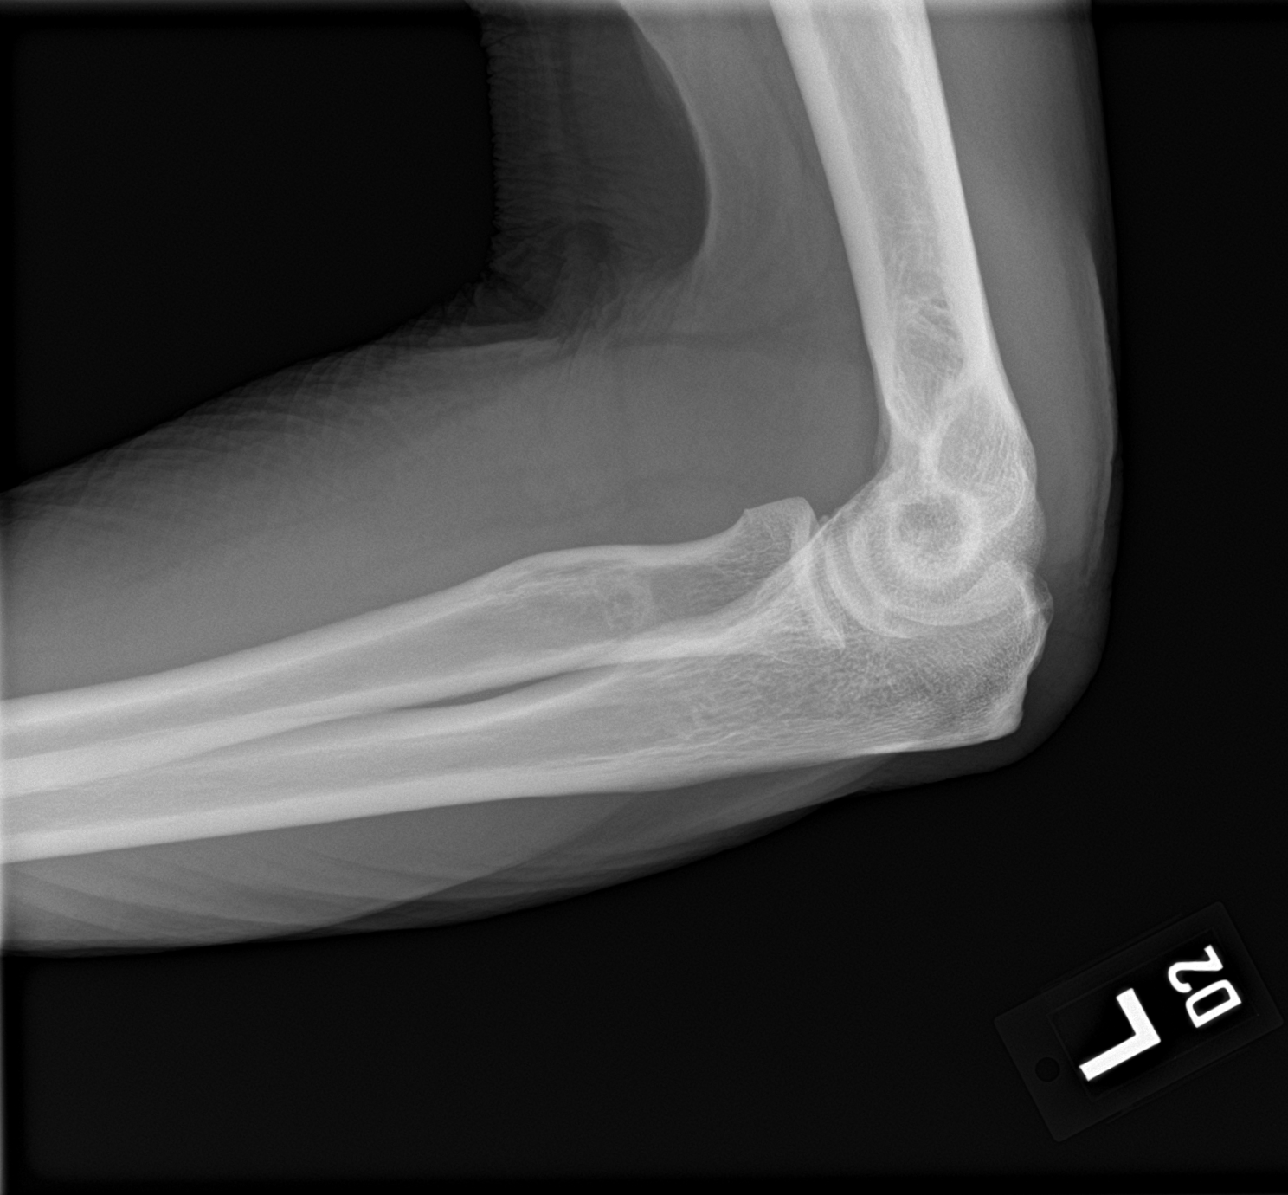

[elbow obl (2 of 2)]
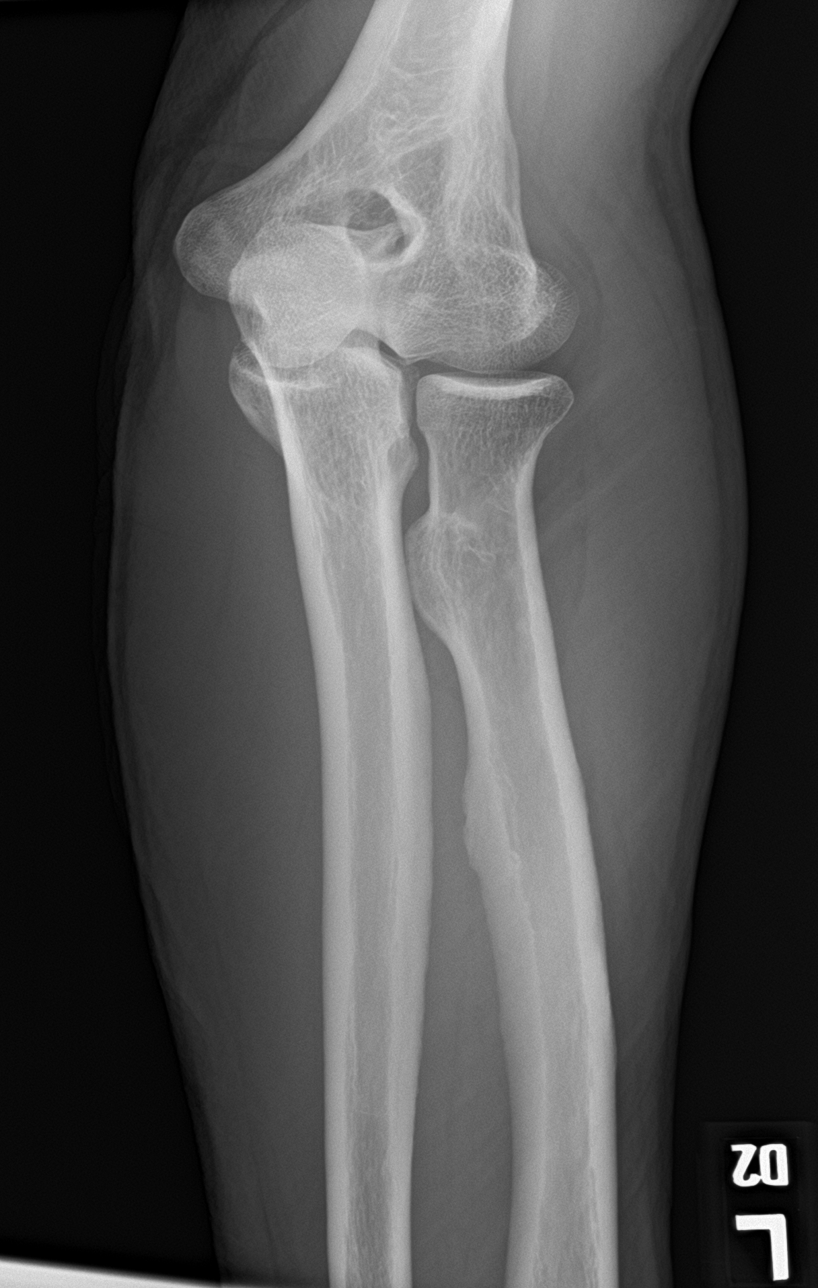

[4 of 4 positions shown; findings below may reference images not displayed]

FINDINGS: There is no evidence of fracture, dislocation, or joint effusion.
There is no evidence of arthropathy or other focal bone abnormality.
Soft tissues are unremarkable.
IMPRESSION: No significant abnormality of the left elbow.

## 2022-03-04 ENCOUNTER — Other Ambulatory Visit: Payer: Self-pay | Admitting: Family Medicine

## 2022-03-04 DIAGNOSIS — G4700 Insomnia, unspecified: Secondary | ICD-10-CM

## 2022-03-05 ENCOUNTER — Encounter: Payer: Self-pay | Admitting: Family Medicine

## 2022-03-05 ENCOUNTER — Ambulatory Visit (INDEPENDENT_AMBULATORY_CARE_PROVIDER_SITE_OTHER): Payer: PPO | Admitting: Family Medicine

## 2022-03-05 VITALS — BP 118/60 | HR 65 | Temp 97.5°F | Resp 16 | Ht 73.0 in

## 2022-03-05 DIAGNOSIS — G4700 Insomnia, unspecified: Secondary | ICD-10-CM | POA: Diagnosis not present

## 2022-03-05 DIAGNOSIS — G20B2 Parkinson's disease with dyskinesia, with fluctuations: Secondary | ICD-10-CM | POA: Diagnosis not present

## 2022-03-05 DIAGNOSIS — R296 Repeated falls: Secondary | ICD-10-CM | POA: Diagnosis not present

## 2022-03-05 DIAGNOSIS — E890 Postprocedural hypothyroidism: Secondary | ICD-10-CM

## 2022-03-05 MED ORDER — MIRTAZAPINE 7.5 MG PO TABS: 7.5000 mg | ORAL_TABLET | Freq: Every day | ORAL | 3 refills | Status: DC

## 2022-03-05 NOTE — Assessment & Plan Note (Signed)
Encouraged use of walker Pt refuses PT Fl2 filled out Hopefully they will be able to get full time help at home with his long term care ins but if not pt will be on the waiting list for NH placement

## 2022-03-05 NOTE — Assessment & Plan Note (Signed)
Fl 2 form filled out They are trying to get in home care first to see if that will be enough but in the meantime he will be on the waiting list for [facility] seeing pt at home and is helping with this

## 2022-03-05 NOTE — Progress Notes (Addendum)
Subjective:   By signing my name below, I, Carylon Perches, attest that this documentation has been prepared under the direction and in the presence of [name] DO 03/05/2022   Patient ID: William Williamson, male    DOB: 03/28/1933, 88 y.o.   MRN: [id number]  Chief Complaint  Patient presents with  . FL2 form    HPI Patient is in today for an office visit. He is accompanied by his son during today's visit.   He is requesting completion of his FL2 forms. He is planning to begin his care at [facility] in [place], [place]. His son states that he is able to feed himself but cannot prepare meals or cut his food. He has had bladder accidents in the past. He does not use the walker. He notes of an episode about two months ago in which he passed out while using the walker thus bruising himself. He was last seen by Dr.Tat on 10/24/2021 and has an upcoming appointment on 04/26/2022.  He states that his 50 mg of Myrebtriq is alleviating his urinary symptoms.  He went to an eye specialist and reports that he had a stroke and  he will have to consistently take eyedrops for his symptoms.   Past Medical History:  Diagnosis Date  . Anemia 1952   post Oromycin for [place]  . Anisocoria    post op  . Arthritis   . Autonomic dysfunction   . GERD (gastroesophageal reflux disease)   . Glaucoma    Dr Bing Plume  . Hearing loss in right ear   . Hypercholesteremia   . Hyperthyroidism    s/p RAI  . LVH (left ventricular hypertrophy)   . Orthostatic hypotension   . Parkinson's disease     Past Surgical History:  Procedure Laterality Date  . CARDIAC CATHETERIZATION  07/2006   Dr.  Eustace Quail, negative  . CATARACT EXTRACTION, BILATERAL     OS retinal surgery for post op floaters  . COLONOSCOPY     negative X 3  . RAI ablation  02/2008   hyperthyroidism  . SEPTOPLASTY  1970  . VASECTOMY      Family History  Problem Relation Age of Onset  . Osteoporosis Mother    . Prostate cancer Father   . Hypertension Sister   . Hypertension Brother   . Kidney cancer Brother        possibly lung cancer as well  . Diabetes Brother   . Lung cancer Brother   . Pancreatic cancer Brother        ???  . Stomach cancer Maternal Grandmother   . Heart attack Maternal Grandfather 62  . Stroke Neg Hx     Social History   Socioeconomic History  . Marital status: Widowed    Spouse name: Not on file  . Number of children: Not on file  . Years of education: Not on file  . Highest education level: Not on file  Occupational History  . Occupation: Retired    Comment: personnel  Tobacco Use  . Smoking status: Former    Types: Cigarettes    Quit date: 04/02/1968    Years since quitting: 53.9  . Smokeless tobacco: Never  Vaping Use  . Vaping Use: Never used  Substance and Sexual Activity  . Alcohol use: Yes    Alcohol/week: 6.0 standard drinks of alcohol    Types: 6 Cans of beer per week  . Drug use: No  .  Sexual activity: Not on file  Other Topics Concern  . 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 Difficulty of Paying Living Expenses: Not hard at all  Food Insecurity: No Food Insecurity (02/14/2022)   Hunger Vital Sign   . Worried About Charity fundraiser in the Last Year: Never true   . Ran Out of Food in the Last Year: Never true  Transportation Needs: No Transportation Needs (02/14/2022)   PRAPARE - Transportation   . Lack of Transportation (Medical): No   . Lack of Transportation (Non-Medical): No  Physical Activity: Inactive (02/14/2022)   Exercise Vital Sign   . Days of Exercise per Week: 0 days   . Minutes of Exercise per Session: 0 min  Stress: No Stress Concern Present (02/14/2022)   [place]   . Feeling of Stress  : Not at all  Social Connections: Socially Isolated (02/14/2022)   Social Connection and Isolation Panel [NHANES]   . Frequency of Communication with Friends and Family: Once a week   . Frequency of Social Gatherings with Friends and Family: Never   . Attends Religious Services: Never   . Active Member of Clubs or Organizations: No   . Attends Archivist Meetings: Never   . Marital Status: Widowed  Intimate Partner Violence: Not At Risk (02/14/2022)   Humiliation, Afraid, Rape, and Kick questionnaire   . Fear of Current or Ex-Partner: No   . Emotionally Abused: No   . Physically Abused: No   . Sexually Abused: No    Outpatient Medications Prior to Visit  Medication Sig Dispense Refill  . carbidopa-levodopa (SINEMET CR) 50-200 MG tablet Take 1 tablet by mouth at bedtime. 90 tablet 1  . carbidopa-levodopa (SINEMET IR) 25-100 MG tablet TAKE 2 TABLETS BY MOUTH AT 7AM 11AM 3PM AND 1 TABLET AT 7PM 630 tablet 1  . Cholecalciferol 25 MCG (1000 UT) CHEW Chew 1 tablet (1,000 Units total) by mouth 2 (two) times daily.    . furosemide (LASIX) 20 MG tablet Take 1 tablet by mouth once daily 90 tablet 0  . levothyroxine (SYNTHROID) 100 MCG tablet TAKE 1 TABLET BY MOUTH ONCE DAILY BEFORE BREAKFAST 90 tablet 0  . MYRBETRIQ 50 MG TB24 tablet Take 1 tablet by mouth once daily 90 tablet 0  . NONFORMULARY OR COMPOUNDED ITEM Walker  #1   Dx parkinson disease 1 each 0  . nystatin (NYSTATIN) powder Apply topically 4 (four) times daily. 15 g 0  . QUEtiapine (SEROQUEL) 25 MG tablet TAKE 1/2 (ONE-HALF) TABLET BY MOUTH AT BEDTIME 45 tablet 1  . simvastatin (ZOCOR) 20 MG tablet TAKE 1 TABLET BY MOUTH ONCE DAILY IN THE EVENING 90 tablet 0  . mirtazapine (REMERON) 7.5 MG tablet TAKE 1 TABLET BY MOUTH AT BEDTIME 90 tablet 0  . lisinopril (ZESTRIL) 10 MG tablet Take 1 tablet (10 mg total) by mouth daily. (Patient not taking: Reported on 02/14/2022) 90 tablet 3   No facility-administered medications prior to  visit.    Allergies  Allergen Reactions  . Garlic Nausea Only  . Niacin     REACTION: flushing    Review of Systems  Constitutional:  Positive for weight loss. Negative for fever and malaise/fatigue.  HENT:  Positive for hearing loss. Negative for congestion.  Eyes:  Negative for blurred vision.  Respiratory:  Negative for cough and shortness of breath.   Cardiovascular:  Negative for chest pain, palpitations and leg swelling.  Gastrointestinal:  Negative for vomiting.  Musculoskeletal:  Negative for back pain.  Skin:  Negative for rash.  Neurological:  Positive for tremors and weakness. Negative for loss of consciousness and headaches.  Psychiatric/Behavioral:  Positive for memory loss.        Objective:    Physical Exam Vitals and nursing note reviewed.  Constitutional:      General: He is not in acute distress.    Appearance: Normal appearance. He is not ill-appearing.  HENT:     Head: Normocephalic and atraumatic.     Right Ear: External ear normal.     Left Ear: External ear normal.  Eyes:     Extraocular Movements: Extraocular movements intact.     Pupils: Pupils are equal, round, and reactive to light.  Cardiovascular:     Rate and Rhythm: Normal rate and regular rhythm.     Heart sounds: Normal heart sounds. No murmur heard.    No gallop.  Pulmonary:     Effort: Pulmonary effort is normal. No respiratory distress.     Breath sounds: Normal breath sounds. No wheezing or rales.  Skin:    General: Skin is warm and dry.  Neurological:     Mental Status: He is alert.     Motor: Weakness and tremor present.     Gait: Gait abnormal.     Comments: Pt answered all questions appropriately Problem is at night ---wanders, freq falls if not using walker and 2 months ago passed out while using walker Truncal dyskinesia seems worse Tremor in hands worse as well  Pt AAO to person--- intermittently disoriented    Psychiatric:        Judgment: Judgment normal.    BP  118/60 (BP Location: Left Arm, Patient Position: Sitting, Cuff Size: Normal)   Pulse 65   Temp (!) 97.5 F (36.4 C) (Oral)   Resp 16   Ht '6\' 1"'$  (1.854 m)   SpO2 92%   BMI 21.51 kg/m  Wt Readings from Last 3 Encounters:  02/14/22 163 lb (73.9 kg)  10/24/21 163 lb (73.9 kg)  06/27/21 169 lb (76.7 kg)    Diabetic Foot Exam - Simple   No data filed    Lab Results  Component Value Date   WBC 6.6 02/01/2022   HGB 12.4 (L) 02/01/2022   HCT 37.1 (L) 02/01/2022   PLT 168.0 02/01/2022   GLUCOSE 86 02/01/2022   CHOL 108 02/01/2022   TRIG 60.0 02/01/2022   HDL 38.60 (L) 02/01/2022   LDLCALC 57 02/01/2022   ALT 5 02/01/2022   AST 19 02/01/2022   NA 141 02/01/2022   K 4.5 02/01/2022   CL 104 02/01/2022   CREATININE 1.21 02/01/2022   BUN 26 (H) 02/01/2022   CO2 31 02/01/2022   TSH 1.78 02/01/2022   PSA 4.61 (H) 03/16/2021   INR 0.9 RATIO 07/26/2006   HGBA1C 5.9 10/09/2006    Lab Results  Component Value Date   TSH 1.78 02/01/2022   Lab Results  Component Value Date   WBC 6.6 02/01/2022   HGB 12.4 (L) 02/01/2022   HCT 37.1 (L) 02/01/2022   MCV 94.2 02/01/2022   PLT 168.0 02/01/2022   Lab Results  Component Value Date   NA 141 02/01/2022   K 4.5 02/01/2022   CO2 31 02/01/2022  GLUCOSE 86 02/01/2022   BUN 26 (H) 02/01/2022   CREATININE 1.21 02/01/2022   BILITOT 0.5 02/01/2022   ALKPHOS 46 02/01/2022   AST 19 02/01/2022   ALT 5 02/01/2022   PROT 6.5 02/01/2022   ALBUMIN 4.2 02/01/2022   CALCIUM 9.0 02/01/2022   GFR 54.08 (L) 02/01/2022   Lab Results  Component Value Date   CHOL 108 02/01/2022   Lab Results  Component Value Date   HDL 38.60 (L) 02/01/2022   Lab Results  Component Value Date   LDLCALC 57 02/01/2022   Lab Results  Component Value Date   TRIG 60.0 02/01/2022   Lab Results  Component Value Date   CHOLHDL 3 02/01/2022   Lab Results  Component Value Date   HGBA1C 5.9 10/09/2006       Assessment & Plan:   Problem List Items  Addressed This Visit       Unprioritized   Parkinson disease    Fl 2 form filled out They are trying to get in home care first to see if that will be enough but in the meantime he will be on the waiting list for [facility] seeing pt at home and is helping with this       Insomnia   Relevant Medications   mirtazapine (REMERON) 7.5 MG tablet   Hypothyroidism following radioiodine therapy - Primary    Lab Results  Component Value Date   TSH 1.78 02/01/2022        Meds ordered this encounter  Medications  . mirtazapine (REMERON) 7.5 MG tablet    Sig: Take 1 tablet (7.5 mg total) by mouth at bedtime.    Dispense:  90 tablet    Refill:  3    I, [name], DO, personally preformed the services described in this documentation.  All medical record entries made by the scribe were at my direction and in my presence.  I have reviewed the chart and discharge instructions (if applicable) and agree that the record reflects my personal performance and is accurate and complete. 03/05/2022   I,Amber Collins,acting as a scribe for [name], DO.,have documented all relevant documentation on the behalf of [name], DO,as directed by  [name], DO while in the presence of [name], DO.    [name], DO

## 2022-03-05 NOTE — Assessment & Plan Note (Signed)
Lab Results  Component Value Date   TSH 1.78 02/01/2022

## 2022-03-05 NOTE — Patient Instructions (Signed)
Parkinson's Disease Parkinson's disease is a movement disorder. It is a long-term condition that gets worse over time. Each person with Parkinson's disease is affected differently. This condition limits a person's ability to control movements and move the body normally. The condition can range from mild to severe. Parkinson's disease tends to get worse slowly over several years. What are the causes? Parkinson's disease is caused by a loss of brain cells (neurons) that make a brain chemical called dopamine. Dopamine is needed to control movement. As the condition gets worse, more neurons that make dopamine die. This makes it hard to move or control your movements. The exact cause of the loss of neurons is not known. Genes and the environment may contribute to the cause of Parkinson's disease. What increases the risk? The following factors may make you more likely to develop this condition: Being male. Being age 57 or older. Having a family history of Parkinson's disease. Having had a traumatic brain injury. Having been exposed to toxins, such as pesticides. Having depression. What are the signs or symptoms? Symptoms of this condition can vary. The main symptoms are related to movement. These include: A tremor or shaking while you are resting. You cannot control the shaking. Stiffness in your arms and legs (rigidity). Slowing of movement. You may lose facial expressions and have trouble making small movements that are needed to button clothing or brush your teeth. An abnormal walk. You may walk with short, shuffling steps. Loss of balance and stability when standing. You may sway, fall backward, and have trouble making turns. Other symptoms include: Mental or cognitive changes, including: Depression or anxiety. Having false beliefs (delusions). Seeing, hearing, or feeling things that do not exist (hallucinations). Trouble speaking or swallowing. Changes in bowel or bladder functions,  including constipation, having to go urgently or frequently, or not being able to control your bowel or bladder. Changes in sleep habits, acting out dreams, or trouble sleeping. Depending on the severity of the symptoms, Parkinson's disease may be mild, moderate, or advanced. Parkinson's disease progression is different for everyone. Some people may not progress to the advanced stage. Mild Parkinson's disease involves: Movement problems that do not affect daily activities. Movement problems on one side of the body. Moderate Parkinson's disease involves: Movement problems on both sides of the body. Slowing of movement. Coordination and balance problems. Advanced Parkinson's disease involves: Extreme difficulty walking. Inability to live alone safely. Signs of dementia, such as having trouble remembering things, doing daily tasks such as getting dressed, and problem solving. How is this diagnosed? This condition is diagnosed by a specialist. A diagnosis may be made based on symptoms, your medical history, and a physical exam. You may also have brain imaging tests to check for loss of neurons in the brain. How is this treated? There is no cure for Parkinson's disease. Treatment focuses on managing your symptoms. Treatment may include: Medicines. Everyone responds to medicines differently. Your response may change over time. Work with your health care provider to find the best medicines for you. Speech, occupational, and physical therapy. Deep brain stimulation surgery to reduce tremors and other involuntary movements. Follow these instructions at home: Medicines Take over-the-counter and prescription medicines only as told by your health care provider. Avoid taking medicines that can affect thinking, such as pain or sleeping medicines. Eating and drinking Follow instructions from your health care provider about eating or drinking restrictions. Do not drink alcohol. Activity Ask your health  care provider if it is safe for you  to drive. Do exercises as told by your health care provider or physical therapist. Lifestyle  Install grab bars and railings in your home to prevent falls. Do not use any products that contain nicotine or tobacco. These products include cigarettes, chewing tobacco, and vaping devices, such as e-cigarettes. If you need help quitting, ask your health care provider. Consider joining a support group for people with Parkinson's disease. General instructions Work with your health care provider to know the kind of day-to-day help that you may need and what to do to stay safe. Keep all follow-up visits. This is important. Follow-up visits include any visits with a physical therapist, speech therapist, or occupational therapist. Where to find more information [employer] of Neurological Disorders and Stroke: [employer] [place]: www.parkinson.org Contact a health care provider if: Medicines do not help your symptoms. You are unsteady or have fallen at home. You need more support to function well at home. You have trouble swallowing. You have severe constipation. You are having problems with side effects from your medicines. You feel confused, anxious, depressed, or have hallucinations. Get help right away if you: Are injured after a fall. Cannot swallow without choking. Have chest pain or trouble breathing. Do not feel safe at home. Have thoughts about hurting yourself or others. These symptoms may represent a serious problem that is an emergency. Do not wait to see if the symptoms will go away. Get medical help right away. Call your local emergency services (911 in the [place] Do not drive yourself to the hospital. If you ever feel like you may hurt yourself or others, or have thoughts about taking your own life, get help right away. Go to your nearest emergency department or: Call your local emergency services (911 in the  [place] Call a suicide crisis helpline, such as the [place] at 857-624-9194 or 988 in the [place]. This is open 24 hours a day in the [place] Text the Crisis Text Line at 7373627224 (in the [place].). Summary Parkinson's disease is a long-term condition that gets worse over time. This condition limits your ability to control your movements and move your body normally. There is no cure for Parkinson's disease. Treatment focuses on managing your symptoms. Work with your health care provider to know the kind of day-to-day help that you may need and what to do to stay safe. Keep all follow-up visits, including any visits with a physical therapist, speech therapist, or occupational therapist. This is important. This information is not intended to replace advice given to you by your health care provider. Make sure you discuss any questions you have with your health care provider. Document Revised: 10/12/2020 Document Reviewed: 07/04/2020 Elsevier Patient Education  [place].

## 2022-03-06 ENCOUNTER — Telehealth: Payer: Self-pay | Admitting: Family Medicine

## 2022-03-06 DIAGNOSIS — L570 Actinic keratosis: Secondary | ICD-10-CM | POA: Diagnosis not present

## 2022-03-06 DIAGNOSIS — X32XXXD Exposure to sunlight, subsequent encounter: Secondary | ICD-10-CM | POA: Diagnosis not present

## 2022-03-06 NOTE — Telephone Encounter (Signed)
Patient's son [name] calling to confirm whether FL2 form was faxed. He said he spoke to [facility] and they haven't received anything today. Please call him back to advise status at [phone]

## 2022-03-07 NOTE — Telephone Encounter (Signed)
Notified son [name] that form has been faxed and confirmation has been received.

## 2022-03-09 ENCOUNTER — Telehealth: Payer: Self-pay

## 2022-03-09 NOTE — Telephone Encounter (Signed)
PC SW sent an email to [facility] senior resources requesting mobile meals for patient.

## 2022-03-09 NOTE — Telephone Encounter (Signed)
PC SW emailed patient several resources for in-home caregivers and a blank FL2 form as requested.

## 2022-03-09 NOTE — Progress Notes (Signed)
[facility] CARE RN VISIT  PATIENT NAME: William Williamson DOB: 1932/10/24 MRN: [id number]  PRIMARY CARE PROVIDER: Carollee Herter, Alferd Apa, DO  RESPONSIBLE PARTY: Mardene Sayer (son) Acct ID - Guarantor Home Phone Work Phone Relationship Acct Type  [id number] William Williamson,* [phone]  Self P/F     [place], [place], [place] [address]    RN follow up visit completed with patient and his son in their home.  Diagnosis: Parkinson's disease. Continues with visual hallucinations. Mainly sees people/objects. Thinks buildings are moving and thinking he is in the woods. Doesn't pay attention to surroundings. Will turn water on and it will run for 4-5 hours. Safety concerns. In October 2022, patient got outside and [place]. He hasn't been left alone since this incident. Doesn't recognize house. Increased disorientation at night. Sometimes can't find other rooms in his home such as the bathroom. Has bilateral hand/arm tremors. On Sinemet IR and CR. Needs constant reminders to take medications.  Mobility: Patient is ambulatory. He has a walker but does not utilize it as he should. He reports feeling weaker today. Observed patient walk, and he basically lunges towards furniture to hold onto to get through the home. He is at high risk for falls. He has had 2 falls over the past 2 weeks, however the son was able to catch him. He is able to bathe, dress and toilet himself but is very slow. Patient is also having increasing issues with fainting with standing.  Cardiovascular: BP 136/82 while sitting. Suspected that patient's BP may drop with standing. Standing BP 93/62.  Appetite: Intake poor-fair. Son prepares meals. Denies dysphagia.   Goals of care: Son is going to contact patient's long term care insurance to see if he can start utilizing this in the home. Wants patient to remain in the home for as long as possible. He did have a tour  at [facility] and wants to get patient on the waiting list. He is agreeable to future visits with palliative care.  Son requests information on in home caregiver agencies and also [employer] Wheels (doesn't know where patient is on the waiting list). I sent this request to our SW.  CODE STATUS: Full Code ADVANCED DIRECTIVES: N MOST FORM: no PPS: 50%   PHYSICAL EXAM:   VITALS: Today's Vitals   02/28/22 1132  BP: 136/82  Pulse: 69  Resp: 18  Temp: 97.9 F (36.6 C)  TempSrc: Temporal  SpO2: 96%  PainSc: 0-No pain    LUNGS: clear to auscultation  CARDIAC: Cor RRR EXTREMITIES: No edema SKIN:  Exposed skin dry and intact   NEURO:  Alert and oriented x 2, increased confusion and disorientation worsening at night, ambulatory, doesn't always use rollator walker, high risk for falls.   William Eastern, RN BSN

## 2022-03-13 ENCOUNTER — Other Ambulatory Visit: Payer: PPO

## 2022-03-13 DIAGNOSIS — Z515 Encounter for palliative care: Secondary | ICD-10-CM

## 2022-03-13 NOTE — Progress Notes (Signed)
[facility] SW NOTE  PATIENT NAME: William Williamson DOB: Dec 10, 1932 MRN: [id number]  PRIMARY CARE PROVIDER: [name], DO  RESPONSIBLE PARTY:  Acct ID - Guarantor Home Phone Work Phone Relationship Acct Type  [id number] Marcelene Butte,* [phone]  Self P/F     [place], [place], [place] [address]    SOCIAL WORK TELEPHONIC ENCOUNTER  PC SW completed a telephonic encounter with patient's son-Kris. [name] advised that the goal is to maintain patient in his home for as long as possible, but with in-home help and the long-term goal is to place patient. SW provided education to him regarding in-home caregiving agencies and provided resources of agencies. SW provided education regarding level of care within long-term care. SW also provided a list of memory care facilities in the [place] area. He advised that he will start to research these facilities. SW introduced him to [employer], which he will also research more. SW extended ongoing support to [place] as he explore care options for patient.  SW to follow-up with [name] in 2-4 weeks.   [address] [place], [place]

## 2022-04-24 NOTE — Progress Notes (Unsigned)
Assessment/Plan:   1.  Parkinsons Disease             -Continue carbidopa/levodopa 2 tablets at 7am/11am/3pm and 1 tablet at 7pm.  Discussed various pillboxes.             -Continue carbidopa/levodopa 50/200 CR at bedtime   -he reported didn't think carbidopa/levodopa helpful.  Discussed that I knew it was working as he has Parkinsons Disease dyskinesia.  However, he doesn't have off time, which is good.  He is very off balance but declines PT             -discussed compliance again, both with medication taking and f/u visits             -Declines therapies again today.  Discussed it would help falls.  Discussed walker at all times.  He is resistant to all of these ideas.  Discussed morbidity and mortality with falls   2.  Probable PDD             -They are looking into higher level of care.  Patient certainly needs 24 hour/day care.             -Patient has been found wandering previously.             -would recommend restart quetiapine, 12.5 mg at bedtime for sleep and hallucinations.  he was agreeable by end of visit after discussion with son.     3.  Insomnia             -On mirtazapine by primary care.      Subjective:   William Williamson was seen today in follow up for Parkinsons disease.  My previous records were reviewed prior to todays visit as well as outside records available to me.  Patient accompanied by his son who supplements the history.  Quetiapine restarted last visit at very low dose at bedtime.  ***Palliative care saw the patient November 29 and they reported that patient wanted to remain in the home and they were going to look into the patient's long-term care insurance and were on the waiting list at [place].  Palliative care saw the patient again December 12 and they also talked about [facility].   Current prescribed movement disorder medications: Carbidopa/levodopa 25/100, 2 tablets at 7am/11am/3pm and 1 tablet at 7pm  Carbidopa/levodopa 50/200 CR at  bedtime. Quetiapine, 12.5 mg at bedtime (started last visit but he isn't taking)   ALLERGIES:   Allergies  Allergen Reactions   Garlic Nausea Only   Niacin     REACTION: flushing    CURRENT MEDICATIONS:  Outpatient Encounter Medications as of 04/26/2022  Medication Sig   carbidopa-levodopa (SINEMET CR) 50-200 MG tablet Take 1 tablet by mouth at bedtime.   carbidopa-levodopa (SINEMET IR) 25-100 MG tablet TAKE 2 TABLETS BY MOUTH AT 7AM 11AM 3PM AND 1 TABLET AT 7PM   Cholecalciferol 25 MCG (1000 UT) CHEW Chew 1 tablet (1,000 Units total) by mouth 2 (two) times daily.   furosemide (LASIX) 20 MG tablet Take 1 tablet by mouth once daily   levothyroxine (SYNTHROID) 100 MCG tablet TAKE 1 TABLET BY MOUTH ONCE DAILY BEFORE BREAKFAST   lisinopril (ZESTRIL) 10 MG tablet Take 1 tablet (10 mg total) by mouth daily. (Patient not taking: Reported on 02/14/2022)   mirtazapine (REMERON) 7.5 MG tablet Take 1 tablet (7.5 mg total) by mouth at bedtime.   MYRBETRIQ 50 MG TB24 tablet Take 1 tablet by mouth once  daily   NONFORMULARY OR COMPOUNDED ITEM Walker  #1   Dx parkinson disease   nystatin (NYSTATIN) powder Apply topically 4 (four) times daily.   QUEtiapine (SEROQUEL) 25 MG tablet TAKE 1/2 (ONE-HALF) TABLET BY MOUTH AT BEDTIME   simvastatin (ZOCOR) 20 MG tablet TAKE 1 TABLET BY MOUTH ONCE DAILY IN THE EVENING   No facility-administered encounter medications on file as of 04/26/2022.    Objective:   PHYSICAL EXAMINATION:    VITALS:   There were no vitals filed for this visit.      GEN:  The patient appears stated age and is in NAD. HEENT:  Normocephalic, atraumatic.  The mucous membranes are moist. The superficial temporal arteries are without ropiness or tenderness. CV:  RRR Lungs:  CTAB Neck/HEME:  There are no carotid bruits bilaterally.  Neurological examination:  Orientation: The patient is alert and oriented to person and place today.  Intermittently confused. Cranial nerves:  There is good facial symmetry with significant facial hypomimia. The speech is fluent and hypophonic. Soft palate rises symmetrically and there is no tongue deviation. Hearing is intact to conversational tone. Sensation: Sensation is intact to light touch throughout Motor: Strength is at least antigravity x4.  Movement examination: Tone: There is normal tone in the UE/LE Abnormal movements:  he has mild truncal dyskinesia; there is rare R>LUE rest tremor Coordination:  There is decremation with RAM's, esp with toe taps on the L Gait and Station: he is forward flexed and off balance even with the walker  I have reviewed and interpreted the following labs independently    Chemistry      Component Value Date/Time   NA 141 02/01/2022 0935   K 4.5 02/01/2022 0935   CL 104 02/01/2022 0935   CO2 31 02/01/2022 0935   BUN 26 (H) 02/01/2022 0935   CREATININE 1.21 02/01/2022 0935      Component Value Date/Time   CALCIUM 9.0 02/01/2022 0935   ALKPHOS 46 02/01/2022 0935   AST 19 02/01/2022 0935   ALT 5 02/01/2022 0935   BILITOT 0.5 02/01/2022 0935       Lab Results  Component Value Date   WBC 6.6 02/01/2022   HGB 12.4 (L) 02/01/2022   HCT 37.1 (L) 02/01/2022   MCV 94.2 02/01/2022   PLT 168.0 02/01/2022    Lab Results  Component Value Date   TSH 1.78 02/01/2022     Total time spent on today's visit was *** minutes, including both face-to-face time and nonface-to-face time.  Time included that spent on review of records (prior notes available to me/labs/imaging if pertinent), discussing treatment and goals, answering patient's questions and coordinating care.  Cc:  [name], DO

## 2022-04-26 ENCOUNTER — Encounter: Payer: Self-pay | Admitting: Neurology

## 2022-04-26 ENCOUNTER — Ambulatory Visit (INDEPENDENT_AMBULATORY_CARE_PROVIDER_SITE_OTHER): Payer: PPO | Admitting: Neurology

## 2022-04-26 VITALS — BP 124/69 | HR 71 | Ht 73.0 in | Wt 170.0 lb

## 2022-04-26 DIAGNOSIS — G20A1 Parkinson's disease without dyskinesia, without mention of fluctuations: Secondary | ICD-10-CM

## 2022-04-26 DIAGNOSIS — G20B2 Parkinson's disease with dyskinesia, with fluctuations: Secondary | ICD-10-CM | POA: Diagnosis not present

## 2022-04-26 DIAGNOSIS — F02818 Dementia in other diseases classified elsewhere, unspecified severity, with other behavioral disturbance: Secondary | ICD-10-CM | POA: Diagnosis not present

## 2022-04-26 MED ORDER — QUETIAPINE FUMARATE 25 MG PO TABS: 12.5000 mg | ORAL_TABLET | Freq: Two times a day (BID) | ORAL | 1 refills | Status: DC

## 2022-05-03 ENCOUNTER — Emergency Department (HOSPITAL_BASED_OUTPATIENT_CLINIC_OR_DEPARTMENT_OTHER): Payer: HMO

## 2022-05-03 ENCOUNTER — Encounter: Payer: Self-pay | Admitting: Family

## 2022-05-03 ENCOUNTER — Ambulatory Visit (INDEPENDENT_AMBULATORY_CARE_PROVIDER_SITE_OTHER): Payer: HMO | Admitting: Family

## 2022-05-03 ENCOUNTER — Encounter (HOSPITAL_BASED_OUTPATIENT_CLINIC_OR_DEPARTMENT_OTHER): Payer: Self-pay

## 2022-05-03 ENCOUNTER — Other Ambulatory Visit: Payer: Self-pay

## 2022-05-03 ENCOUNTER — Inpatient Hospital Stay (HOSPITAL_BASED_OUTPATIENT_CLINIC_OR_DEPARTMENT_OTHER)
Admission: EM | Admit: 2022-05-03 | Discharge: 2022-05-11 | DRG: 194 | Disposition: A | Discharge status: Skilled Nursing Facility | Payer: HMO | Attending: Internal Medicine | Admitting: Internal Medicine

## 2022-05-03 VITALS — BP 142/86 | Ht 73.0 in

## 2022-05-03 DIAGNOSIS — R404 Transient alteration of awareness: Secondary | ICD-10-CM | POA: Diagnosis present

## 2022-05-03 DIAGNOSIS — Z8051 Family history of malignant neoplasm of kidney: Secondary | ICD-10-CM

## 2022-05-03 DIAGNOSIS — Z8042 Family history of malignant neoplasm of prostate: Secondary | ICD-10-CM | POA: Diagnosis not present

## 2022-05-03 DIAGNOSIS — G20A1 Parkinson's disease without dyskinesia, without mention of fluctuations: Secondary | ICD-10-CM | POA: Diagnosis present

## 2022-05-03 DIAGNOSIS — Z7401 Bed confinement status: Secondary | ICD-10-CM | POA: Diagnosis not present

## 2022-05-03 DIAGNOSIS — Z87891 Personal history of nicotine dependence: Secondary | ICD-10-CM | POA: Diagnosis not present

## 2022-05-03 DIAGNOSIS — R32 Unspecified urinary incontinence: Secondary | ICD-10-CM | POA: Diagnosis not present

## 2022-05-03 DIAGNOSIS — F0280 Dementia in other diseases classified elsewhere without behavioral disturbance: Secondary | ICD-10-CM | POA: Diagnosis present

## 2022-05-03 DIAGNOSIS — Z801 Family history of malignant neoplasm of trachea, bronchus and lung: Secondary | ICD-10-CM

## 2022-05-03 DIAGNOSIS — G4700 Insomnia, unspecified: Secondary | ICD-10-CM

## 2022-05-03 DIAGNOSIS — Z751 Person awaiting admission to adequate facility elsewhere: Secondary | ICD-10-CM

## 2022-05-03 DIAGNOSIS — H409 Unspecified glaucoma: Secondary | ICD-10-CM | POA: Diagnosis present

## 2022-05-03 DIAGNOSIS — Z8262 Family history of osteoporosis: Secondary | ICD-10-CM

## 2022-05-03 DIAGNOSIS — F02A18 Dementia in other diseases classified elsewhere, mild, with other behavioral disturbance: Secondary | ICD-10-CM | POA: Diagnosis not present

## 2022-05-03 DIAGNOSIS — G3183 Dementia with Lewy bodies: Secondary | ICD-10-CM | POA: Diagnosis not present

## 2022-05-03 DIAGNOSIS — Z9181 History of falling: Secondary | ICD-10-CM | POA: Diagnosis not present

## 2022-05-03 DIAGNOSIS — I10 Essential (primary) hypertension: Secondary | ICD-10-CM | POA: Diagnosis present

## 2022-05-03 DIAGNOSIS — R443 Hallucinations, unspecified: Secondary | ICD-10-CM

## 2022-05-03 DIAGNOSIS — Z888 Allergy status to other drugs, medicaments and biological substances status: Secondary | ICD-10-CM | POA: Diagnosis not present

## 2022-05-03 DIAGNOSIS — Z833 Family history of diabetes mellitus: Secondary | ICD-10-CM

## 2022-05-03 DIAGNOSIS — G20B2 Parkinson's disease with dyskinesia, with fluctuations: Secondary | ICD-10-CM | POA: Diagnosis not present

## 2022-05-03 DIAGNOSIS — Z79890 Hormone replacement therapy (postmenopausal): Secondary | ICD-10-CM

## 2022-05-03 DIAGNOSIS — Z91018 Allergy to other foods: Secondary | ICD-10-CM | POA: Diagnosis not present

## 2022-05-03 DIAGNOSIS — E890 Postprocedural hypothyroidism: Secondary | ICD-10-CM | POA: Diagnosis present

## 2022-05-03 DIAGNOSIS — J189 Pneumonia, unspecified organism: Secondary | ICD-10-CM | POA: Diagnosis present

## 2022-05-03 DIAGNOSIS — K219 Gastro-esophageal reflux disease without esophagitis: Secondary | ICD-10-CM | POA: Diagnosis present

## 2022-05-03 DIAGNOSIS — Z8249 Family history of ischemic heart disease and other diseases of the circulatory system: Secondary | ICD-10-CM

## 2022-05-03 DIAGNOSIS — H9191 Unspecified hearing loss, right ear: Secondary | ICD-10-CM | POA: Diagnosis present

## 2022-05-03 DIAGNOSIS — R1310 Dysphagia, unspecified: Secondary | ICD-10-CM | POA: Diagnosis present

## 2022-05-03 DIAGNOSIS — R9431 Abnormal electrocardiogram [ECG] [EKG]: Secondary | ICD-10-CM | POA: Diagnosis not present

## 2022-05-03 DIAGNOSIS — Z79899 Other long term (current) drug therapy: Secondary | ICD-10-CM | POA: Diagnosis not present

## 2022-05-03 DIAGNOSIS — F0282 Dementia in other diseases classified elsewhere, unspecified severity, with psychotic disturbance: Secondary | ICD-10-CM | POA: Diagnosis present

## 2022-05-03 DIAGNOSIS — Z1152 Encounter for screening for COVID-19: Secondary | ICD-10-CM

## 2022-05-03 DIAGNOSIS — Z800 Family history of malignant neoplasm of digestive organs: Secondary | ICD-10-CM | POA: Diagnosis not present

## 2022-05-03 DIAGNOSIS — R059 Cough, unspecified: Secondary | ICD-10-CM | POA: Diagnosis not present

## 2022-05-03 DIAGNOSIS — M6281 Muscle weakness (generalized): Secondary | ICD-10-CM | POA: Diagnosis not present

## 2022-05-03 DIAGNOSIS — R918 Other nonspecific abnormal finding of lung field: Secondary | ICD-10-CM | POA: Diagnosis not present

## 2022-05-03 DIAGNOSIS — E7800 Pure hypercholesterolemia, unspecified: Secondary | ICD-10-CM | POA: Diagnosis present

## 2022-05-03 DIAGNOSIS — G9341 Metabolic encephalopathy: Secondary | ICD-10-CM | POA: Diagnosis not present

## 2022-05-03 DIAGNOSIS — R4182 Altered mental status, unspecified: Secondary | ICD-10-CM | POA: Diagnosis not present

## 2022-05-03 LAB — COMPREHENSIVE METABOLIC PANEL
ALT: <5 U/L (ref 0–44)
AST: 26 U/L (ref 15–41)
Albumin: 3.5 g/dL (ref 3.5–5.0)
Alkaline Phosphatase: 51 U/L (ref 38–126)
Anion gap: 6 (ref 5–15)
BUN: 29 mg/dL — ABNORMAL HIGH (ref 8–23)
CO2: 28 mmol/L (ref 22–32)
Calcium: 8.6 mg/dL — ABNORMAL LOW (ref 8.9–10.3)
Chloride: 100 mmol/L (ref 98–111)
Creatinine, Ser: 1.32 mg/dL — ABNORMAL HIGH (ref 0.61–1.24)
GFR, Estimated: 52 mL/min — ABNORMAL LOW (ref >60)
Glucose, Bld: 99 mg/dL (ref 70–99)
Potassium: 4.1 mmol/L (ref 3.5–5.1)
Sodium: 134 mmol/L — ABNORMAL LOW (ref 135–145)
Total Bilirubin: 0.9 mg/dL (ref 0.3–1.2)
Total Protein: 7 g/dL (ref 6.5–8.1)

## 2022-05-03 LAB — URINALYSIS, W/ REFLEX TO CULTURE (INFECTION SUSPECTED)
Bilirubin Urine: NEGATIVE
Glucose, UA: NEGATIVE mg/dL
Hgb urine dipstick: NEGATIVE
Ketones, ur: NEGATIVE mg/dL
Leukocytes,Ua: NEGATIVE
Nitrite: NEGATIVE
Protein, ur: NEGATIVE mg/dL
Specific Gravity, Urine: 1.01 (ref 1.005–1.030)
pH: 5.5 (ref 5.0–8.0)

## 2022-05-03 LAB — POCT URINALYSIS DIPSTICK
Bilirubin, UA: NEGATIVE
Blood, UA: NEGATIVE
Glucose, UA: NEGATIVE
Ketones, UA: NEGATIVE
Leukocytes, UA: NEGATIVE
Nitrite, UA: NEGATIVE
Protein, UA: NEGATIVE
Spec Grav, UA: <=1.005 — AB (ref 1.010–1.025)
Urobilinogen, UA: 0.2 E.U./dL
pH, UA: 5 (ref 5.0–8.0)

## 2022-05-03 LAB — CBC
HCT: 34.2 % — ABNORMAL LOW (ref 39.0–52.0)
Hemoglobin: 11.4 g/dL — ABNORMAL LOW (ref 13.0–17.0)
MCH: 31.4 pg (ref 26.0–34.0)
MCHC: 33.3 g/dL (ref 30.0–36.0)
MCV: 94.2 fL (ref 80.0–100.0)
Platelets: 155 10*3/uL (ref 150–400)
RBC: 3.63 MIL/uL — ABNORMAL LOW (ref 4.22–5.81)
RDW: 12.3 % (ref 11.5–15.5)
WBC: 8.2 10*3/uL (ref 4.0–10.5)
nRBC: 0 % (ref 0.0–0.2)

## 2022-05-03 LAB — LACTIC ACID, PLASMA: Lactic Acid, Venous: 1.1 mmol/L (ref 0.5–1.9)

## 2022-05-03 LAB — RAPID URINE DRUG SCREEN, HOSP PERFORMED
Amphetamines: NOT DETECTED
Barbiturates: NOT DETECTED
Benzodiazepines: NOT DETECTED
Cocaine: NOT DETECTED
Opiates: NOT DETECTED
Tetrahydrocannabinol: NOT DETECTED

## 2022-05-03 LAB — RESP PANEL BY RT-PCR (RSV, FLU A&B, COVID)  RVPGX2
Influenza A by PCR: NEGATIVE
Influenza B by PCR: NEGATIVE
Resp Syncytial Virus by PCR: NEGATIVE
SARS Coronavirus 2 by RT PCR: NEGATIVE

## 2022-05-03 LAB — ETHANOL: Alcohol, Ethyl (B): <10 mg/dL (ref <10)

## 2022-05-03 LAB — CBG MONITORING, ED: Glucose-Capillary: 75 mg/dL (ref 70–99)

## 2022-05-03 MED ORDER — SODIUM CHLORIDE 0.9 % IV SOLN
2.0000 g | INTRAVENOUS | Status: AC
  Administered 2022-05-04 – 2022-05-07 (×4): 2 g via INTRAVENOUS
  Filled 2022-05-03 (×4): qty 20

## 2022-05-03 MED ORDER — SODIUM CHLORIDE 0.9 % IV SOLN: INTRAVENOUS | Status: DC | PRN

## 2022-05-03 MED ORDER — SODIUM CHLORIDE 0.9 % IV SOLN
1.0000 g | Freq: Once | INTRAVENOUS | Status: AC
  Administered 2022-05-03: 1 g via INTRAVENOUS
  Filled 2022-05-03: qty 10

## 2022-05-03 MED ORDER — SODIUM CHLORIDE 0.9 % IV SOLN
500.0000 mg | INTRAVENOUS | Status: DC
  Administered 2022-05-04 – 2022-05-05 (×2): 500 mg via INTRAVENOUS
  Filled 2022-05-03 (×2): qty 5

## 2022-05-03 MED ORDER — ENOXAPARIN SODIUM 40 MG/0.4ML IJ SOSY
40.0000 mg | PREFILLED_SYRINGE | INTRAMUSCULAR | Status: DC
  Administered 2022-05-04 – 2022-05-10 (×7): 40 mg via SUBCUTANEOUS
  Filled 2022-05-03 (×7): qty 0.4

## 2022-05-03 MED ORDER — SODIUM CHLORIDE 0.9 % IV SOLN
500.0000 mg | Freq: Once | INTRAVENOUS | Status: AC
  Administered 2022-05-03: 500 mg via INTRAVENOUS
  Filled 2022-05-03: qty 5

## 2022-05-03 MED ORDER — QUETIAPINE FUMARATE 25 MG PO TABS
12.5000 mg | ORAL_TABLET | Freq: Two times a day (BID) | ORAL | Status: DC
  Administered 2022-05-04 – 2022-05-06 (×6): 12.5 mg via ORAL
  Filled 2022-05-03 (×6): qty 1

## 2022-05-03 MED ORDER — MIRTAZAPINE 15 MG PO TABS
7.5000 mg | ORAL_TABLET | Freq: Every day | ORAL | Status: DC
  Administered 2022-05-04 – 2022-05-10 (×8): 7.5 mg via ORAL
  Filled 2022-05-03 (×8): qty 1

## 2022-05-03 MED ORDER — LEVOTHYROXINE SODIUM 100 MCG PO TABS
100.0000 ug | ORAL_TABLET | Freq: Every day | ORAL | Status: DC
  Administered 2022-05-04 – 2022-05-11 (×8): 100 ug via ORAL
  Filled 2022-05-03 (×8): qty 1

## 2022-05-03 MED ORDER — ACETAMINOPHEN 650 MG RE SUPP: 650.0000 mg | Freq: Four times a day (QID) | RECTAL | Status: DC | PRN

## 2022-05-03 MED ORDER — CARBIDOPA-LEVODOPA ER 50-200 MG PO TBCR
1.0000 | EXTENDED_RELEASE_TABLET | Freq: Every day | ORAL | Status: DC
  Administered 2022-05-04 – 2022-05-10 (×7): 1 via ORAL
  Filled 2022-05-03 (×9): qty 1

## 2022-05-03 MED ORDER — ONDANSETRON HCL 4 MG/2ML IJ SOLN: 4.0000 mg | Freq: Four times a day (QID) | INTRAMUSCULAR | Status: DC | PRN

## 2022-05-03 MED ORDER — SIMVASTATIN 20 MG PO TABS
20.0000 mg | ORAL_TABLET | Freq: Every evening | ORAL | Status: DC
  Administered 2022-05-04 – 2022-05-10 (×7): 20 mg via ORAL
  Filled 2022-05-03 (×7): qty 1

## 2022-05-03 MED ORDER — LACTATED RINGERS IV SOLN: INTRAVENOUS | Status: DC

## 2022-05-03 MED ORDER — ONDANSETRON HCL 4 MG PO TABS: 4.0000 mg | ORAL_TABLET | Freq: Four times a day (QID) | ORAL | Status: DC | PRN

## 2022-05-03 MED ORDER — IOHEXOL 300 MG/ML  SOLN
75.0000 mL | Freq: Once | INTRAMUSCULAR | Status: AC | PRN
  Administered 2022-05-03: 75 mL via INTRAVENOUS

## 2022-05-03 MED ORDER — CARBIDOPA-LEVODOPA 25-100 MG PO TABS
2.0000 | ORAL_TABLET | Freq: Three times a day (TID) | ORAL | Status: DC
  Administered 2022-05-04 – 2022-05-11 (×21): 2 via ORAL
  Filled 2022-05-03 (×22): qty 2

## 2022-05-03 MED ORDER — ACETAMINOPHEN 325 MG PO TABS
650.0000 mg | ORAL_TABLET | Freq: Four times a day (QID) | ORAL | Status: DC | PRN
  Administered 2022-05-04: 650 mg via ORAL
  Filled 2022-05-03: qty 2

## 2022-05-03 NOTE — Progress Notes (Signed)
William Williamson is a 89 y.o. male with the following history as recorded in [employer]:  Patient Active Problem List   Diagnosis Date Noted   Loose stools 03/16/2021   Low back pain radiating to left leg 09/01/2020   Frequent falls 08/12/2020   Depression 05/14/2020   Insomnia 04/12/2020   Hallucinations 04/12/2020   Left elbow pain 04/12/2020   Left wrist pain 04/12/2020   Urinary incontinence 04/12/2020   Benign prostatic hyperplasia with urinary frequency 03/03/2019   Tinea corporis 03/03/2019   Elevated BP without diagnosis of hypertension 12/30/2018   Lower extremity edema 09/09/2018   Otalgia 08/26/2018   Preventative health care 08/16/2016   Hypothyroidism 08/16/2016   Prostate enlargement 08/16/2016   Autonomic dysfunction 02/02/2016   Ingrown toenail 05/17/2015   Right thigh pain 12/24/2014   H. pylori infection 06/21/2014   Primary hypertension 02/08/2014   Hyperlipidemia 02/08/2014   Parkinson disease 12/21/2013   Hearing loss 10/06/2013   Paralysis agitans 07/28/2013   UTI (urinary tract infection) 05/27/2013   Nonspecific abnormal electrocardiogram (ECG) (EKG) 06/14/2011   Anemia, unspecified 06/13/2010   CONSTIPATION, CHRONIC 06/09/2009   Hypothyroidism following radioiodine therapy 07/07/2008   Vitamin D deficiency 01/09/2008   HYPERCHOLESTEROLEMIA 07/10/2007   Arthropathy 07/10/2007   G E R D 10/11/2006    Current Outpatient Medications  Medication Sig Dispense Refill   carbidopa-levodopa (SINEMET CR) 50-200 MG tablet Take 1 tablet by mouth at bedtime. 90 tablet 1   carbidopa-levodopa (SINEMET IR) 25-100 MG tablet TAKE 2 TABLETS BY MOUTH AT 7AM 11AM 3PM AND 1 TABLET AT 7PM 630 tablet 1   Cholecalciferol 25 MCG (1000 UT) CHEW Chew 1 tablet (1,000 Units total) by mouth 2 (two) times daily.     furosemide (LASIX) 20 MG tablet Take 1 tablet by mouth once daily 90 tablet 0   levothyroxine (SYNTHROID) 100 MCG tablet TAKE 1 TABLET BY MOUTH ONCE DAILY BEFORE  BREAKFAST 90 tablet 0   mirtazapine (REMERON) 7.5 MG tablet Take 1 tablet (7.5 mg total) by mouth at bedtime. 90 tablet 3   MYRBETRIQ 50 MG TB24 tablet Take 1 tablet by mouth once daily 90 tablet 0   NONFORMULARY OR COMPOUNDED ITEM Walker  #1   Dx parkinson disease 1 each 0   nystatin (NYSTATIN) powder Apply topically 4 (four) times daily. 15 g 0   QUEtiapine (SEROQUEL) 25 MG tablet Take 0.5 tablets (12.5 mg total) by mouth 2 (two) times daily. 90 tablet 1   simvastatin (ZOCOR) 20 MG tablet TAKE 1 TABLET BY MOUTH ONCE DAILY IN THE EVENING 90 tablet 0   No current facility-administered medications for this visit.    Allergies: Garlic and Niacin  Past Medical History:  Diagnosis Date   Anemia 1952   post Oromycin for [place]aremia   Anisocoria    post op   Arthritis    Autonomic dysfunction    GERD (gastroesophageal reflux disease)    Glaucoma    Dr Bing Plume   Hearing loss in right ear    Hypercholesteremia    Hyperthyroidism    s/p RAI   LVH (left ventricular hypertrophy)    Orthostatic hypotension    Parkinson's disease     Past Surgical History:  Procedure Laterality Date   CARDIAC CATHETERIZATION  07/2006   Dr.  Eustace Quail, negative   CATARACT EXTRACTION, BILATERAL     OS retinal surgery for post op floaters   COLONOSCOPY     negative X 3   RAI ablation  02/2008   hyperthyroidism   SEPTOPLASTY  1970   VASECTOMY      Family History  Problem Relation Age of Onset   Osteoporosis Mother    Prostate cancer Father    Hypertension Sister    Hypertension Brother    Kidney cancer Brother        possibly lung cancer as well   Diabetes Brother    Lung cancer Brother    Pancreatic cancer Brother        ???   Stomach cancer Maternal Grandmother    Heart attack Maternal Grandfather 62   Stroke Neg Hx     Social History   Tobacco Use   Smoking status: Former    Types: Cigarettes    Quit date: 04/02/1968    Years since quitting: 54.1   Smokeless tobacco: Never  Substance  Use Topics   Alcohol use: Yes    Alcohol/week: 6.0 standard drinks of alcohol    Types: 6 Cans of beer per week    Subjective:  Accompanied by son today who helps provide majority of history; 4 day history of incontinence/ worsening hallucinations; per son, his father was kneeling beside his bed this morning and peeing on the floor because "he thought he was in the woods." Son also notes that earlier in the week he caught his dad pulling medication out of pillpack and "preparing to go for hike in [place]."  Did fall recently at home as well;   Objective:  Vitals:   05/03/22 1123  BP: (!) 142/86  Height: '6\' 1"'$  (1.854 m)    General: Well developed, well nourished, in no acute distress  Skin : Warm and dry.  Head: Normocephalic and atraumatic  Eyes: Sclera and conjunctiva clear; pupils round and reactive to light; extraocular movements intact  Ears: External normal; canals clear; tympanic membranes normal  Oropharynx: Pink, supple. No suspicious lesions  Neck: Supple without thyromegaly, adenopathy  Lungs: Respirations unlabored; clear to auscultation bilaterally without wheeze, rales, rhonchi  CVS exam: normal rate and regular rhythm.  Abdomen: Soft; nontender; nondistended; normoactive bowel sounds; no masses or hepatosplenomegaly  Musculoskeletal: No deformities; no active joint inflammation  Extremities: No edema, cyanosis, clubbing  Vessels: Symmetric bilaterally  Neurologic: Alert and oriented; speech intact; face symmetrical; in wheelchair; known Parkinson's  Assessment:  1. Urinary incontinence, unspecified type   2. Hallucinations   3. History of recent fall     Plan:  Concerning for underlying infection; U/A is normal but urine is dark in color and am concerned for patient's safety; recommend ER and probable admission; son and patient are agreeable; spoke with MD at ER at [facility] and they are aware of symptoms/ concerns;   Time spent 30 minutes discussing symptoms and  coordinating care  No follow-ups on file.  Orders Placed This Encounter  Procedures   POCT Urinalysis Dipstick    Requested Prescriptions    No prescriptions requested or ordered in this encounter

## 2022-05-03 NOTE — H&P (Signed)
History and Physical    Patient: William Williamson [id number] DOB: April 01, 1933 DOA: 05/03/2022 DOS: the patient was seen and examined on 05/03/2022 PCP: [name], DO  Patient coming from: Home  Chief Complaint:  Chief Complaint  Patient presents with   Altered Mental Status   Urinary Incontinence   HPI: William Williamson is a 89 y.o. male with medical history significant of Parkinson's disease.  Pt with intermittent confusion from PD dementia at baseline.  But this has been worse over past few days.  Pt with cough, congestion.  Cough productive of mucus.  Cough symptoms for past ~10 days per son.  Respiratory / PNA symptoms have actually improved somewhat though still coughing up a bunch of "junk" per son.  However has had increased confusion, urinary incontinence, hallucinations over past couple of days.  No overt aspiration episodes per son, no cough after eating, no h/o aspiration previously, etc.   Review of Systems: As mentioned in the history of present illness. All other systems reviewed and are negative. Past Medical History:  Diagnosis Date   Anemia 1952   post Oromycin for [place]mia   Anisocoria    post op   Arthritis    Autonomic dysfunction    GERD (gastroesophageal reflux disease)    Glaucoma    Dr Bing Plume   Hearing loss in right ear    Hypercholesteremia    Hyperthyroidism    s/p RAI   LVH (left ventricular hypertrophy)    Orthostatic hypotension    Parkinson's disease    Past Surgical History:  Procedure Laterality Date   CARDIAC CATHETERIZATION  07/2006   Dr.  Eustace Quail, negative   CATARACT EXTRACTION, BILATERAL     OS retinal surgery for post op floaters   COLONOSCOPY     negative X 3   RAI ablation  02/2008   hyperthyroidism   SEPTOPLASTY  1970   VASECTOMY     Social History:  reports that he quit smoking about 54 years ago. His smoking use included cigarettes. He has never used smokeless tobacco. He reports current alcohol  use of about 6.0 standard drinks of alcohol per week. He reports that he does not use drugs.  Allergies  Allergen Reactions   Garlic Nausea Only   Niacin     REACTION: flushing    Family History  Problem Relation Age of Onset   Osteoporosis Mother    Prostate cancer Father    Hypertension Sister    Hypertension Brother    Kidney cancer Brother        possibly lung cancer as well   Diabetes Brother    Lung cancer Brother    Pancreatic cancer Brother        ???   Stomach cancer Maternal Grandmother    Heart attack Maternal Grandfather 62   Stroke Neg Hx     Prior to Admission medications   Medication Sig Start Date End Date Taking? Authorizing Provider  carbidopa-levodopa (SINEMET CR) 50-200 MG tablet Take 1 tablet by mouth at bedtime. 10/24/21  Yes Tat, Eustace Quail, DO  carbidopa-levodopa (SINEMET IR) 25-100 MG tablet TAKE 2 TABLETS BY MOUTH AT 7AM 11AM 3PM AND 1 TABLET AT 7PM 10/24/21  Yes Tat, Eustace Quail, DO  Cholecalciferol 25 MCG (1000 UT) CHEW Chew 1 tablet (1,000 Units total) by mouth 2 (two) times daily.    [provider]  furosemide (LASIX) 20 MG tablet Take 1 tablet by mouth once daily 09/14/21  Carollee Herter, Yvonne R, DO  levothyroxine (SYNTHROID) 100 MCG tablet TAKE 1 TABLET BY MOUTH ONCE DAILY BEFORE BREAKFAST 02/09/22   Carollee Herter, Alferd Apa, DO  mirtazapine (REMERON) 7.5 MG tablet Take 1 tablet (7.5 mg total) by mouth at bedtime. 03/05/22   [name], DO  MYRBETRIQ 50 MG TB24 tablet Take 1 tablet by mouth once daily 02/24/22   Carollee Herter, Alferd Apa, DO  NONFORMULARY OR COMPOUNDED Peter Congo Walker  #1   Dx parkinson disease 09/23/18   Carollee Herter, Alferd Apa, DO  nystatin (NYSTATIN) powder Apply topically 4 (four) times daily. 03/03/19   [name], DO  QUEtiapine (SEROQUEL) 25 MG tablet Take 0.5 tablets (12.5 mg total) by mouth 2 (two) times daily. 04/26/22   Tat, Eustace Quail, DO  simvastatin (ZOCOR) 20 MG tablet TAKE 1 TABLET BY MOUTH ONCE DAILY IN  THE EVENING 02/09/22   [name], DO    Physical Exam: Vitals:   05/03/22 1800 05/03/22 1830 05/03/22 1947 05/03/22 2059  BP: 123/68 131/70  (!) 156/79  Pulse: 79 73  70  Resp: '19 19  18  '$ Temp:   99.4 F (37.4 C) 98.6 F (37 C)  TempSrc:   Oral Oral  SpO2: 92% 93%  94%  Weight:    67 kg  Height:       Constitutional: NAD, calm, comfortable Respiratory: Rales throughout both lungs, especially over R middle and lower lung fields. Cardiovascular: Regular rate and rhythm, no murmurs / rubs / gallops. No extremity edema. 2+ pedal pulses. No carotid bruits.  Abdomen: no tenderness, no masses palpated. No hepatosplenomegaly. Bowel sounds positive. Neurologic: Grossly non-focal Psychiatric: Pleasantly confused  Data Reviewed:    CT CHEST: IMPRESSION: 1. Right middle lobe consolidation and numerous solid nodules and patchy consolidations which are most pronounced in the bilateral lower lobes and right middle lobe, findings are likely due to aspiration or infection. Recommend follow-up chest CT in 3 months to ensure resolution. 2. Mildly enlarged AP window lymph node which is likely reactive. Recommend attention on follow-up 3. Aortic Atherosclerosis (ICD10-I70.0).  COVID, FLU, RSV = negative.     Latest Ref Rng & Units 05/03/2022    1:07 PM 02/01/2022    9:35 AM 03/16/2021   12:03 PM  CMP  Glucose 70 - 99 mg/dL 99  86  79   BUN 8 - 23 mg/dL '29  26  27   '$ Creatinine 0.61 - 1.24 mg/dL 1.32  1.21  1.19   Sodium 135 - 145 mmol/L 134  141  139   Potassium 3.5 - 5.1 mmol/L 4.1  4.5  4.1   Chloride 98 - 111 mmol/L 100  104  102   CO2 22 - 32 mmol/L 28  31  32   Calcium 8.9 - 10.3 mg/dL 8.6  9.0  8.9   Total Protein 6.5 - 8.1 g/dL 7.0  6.5  6.5   Total Bilirubin 0.3 - 1.2 mg/dL 0.9  0.5  0.7   Alkaline Phos 38 - 126 U/L 51  46  51   AST 15 - 41 U/L '26  19  17   '$ ALT 0 - 44 U/L '5  5  6    '$ CBC    Component Value Date/Time   WBC 8.2 05/03/2022 1307   RBC 3.63 (L)  05/03/2022 1307   HGB 11.4 (L) 05/03/2022 1307   HCT 34.2 (L) 05/03/2022 1307   PLT 155 05/03/2022 1307  MCV 94.2 05/03/2022 1307   MCH 31.4 05/03/2022 1307   MCHC 33.3 05/03/2022 1307   RDW 12.3 05/03/2022 1307   LYMPHSABS 1.5 02/01/2022 0935   MONOABS 0.5 02/01/2022 0935   EOSABS 0.0 02/01/2022 0935   BASOSABS 0.0 02/01/2022 0935   CT Head: IMPRESSION: Atrophy and chronic small vessel ischemic changes. No acute intracranial process identified.  Assessment and Plan: * Acute metabolic encephalopathy Suspect delirium secondary to PNA. Delirium precautions Cont seroquel, looks like this started by neuro for the delirium as outpt a couple of days ago. Treating PNA If no improvement in mental status then son will be unable to care for pt at home. PT/OT SW  CAP (community acquired pneumonia) Multifocal CAP vs aspiration PNA from silent aspiration. PNA pathway Rocephin + azithro empirically COVID, FLU, RSV neg NPO except sips with meds for the moment Pending SLP evaluation in AM  Parkinson disease Cont sinemet NPO other than meds pending SLP evaluation for possible aspiration.  Hypothyroidism following radioiodine therapy Cont synthroid      Advance Care Planning:   Code Status: Full Code Confirmed with Son  Consults: None  Family Communication: Son at bedside  Severity of Illness: The appropriate patient status for this patient is OBSERVATION. Observation status is judged to be reasonable and necessary in order to provide the required intensity of service to ensure the patient's safety. The patient's presenting symptoms, physical exam findings, and initial radiographic and laboratory data in the context of their medical condition is felt to place them at decreased risk for further clinical deterioration. Furthermore, it is anticipated that the patient will be medically stable for discharge from the hospital within 2 midnights of admission.   Author: Etta Quill., DO 05/03/2022 11:30 PM  For on call review [email or web address].

## 2022-05-03 NOTE — ED Notes (Addendum)
[facility] arrived- son called by Network engineer and updated on departure and destination. Floor notified that pt otw.

## 2022-05-03 NOTE — Assessment & Plan Note (Signed)
Multifocal CAP vs aspiration PNA from silent aspiration. PNA pathway Rocephin + azithro empirically COVID, FLU, RSV neg NPO except sips with meds for the moment Pending SLP evaluation in AM

## 2022-05-03 NOTE — ED Provider Notes (Signed)
[facility] Provider Note   CSN: [id number] Arrival date & time: 05/03/22  1211     History  Chief Complaint  Patient presents with   Altered Mental Status   Urinary Incontinence    William Williamson is a 89 y.o. male with consent disease, hypothyroidism, hypercholesterolemia, GERD, autonomic dysfunction, HLD, arthropathy, chronic constipation, hearing loss who presents with altered mental status and urinary incontinence.   Here with son who states pt has had increased confusion, hallucinations & incontinence x 5 days. Sent by provider. Hx parkinsons.  Cough x 10 days, improving, productive of sputum. No hemoptysis. No f/c, CP, SOB, N/V/D. Constipation at baseline. Denies dysuria/hematuria. Has had accidents at home and today urinated in the bedroom because he thought he was in the woods. Son states this has never happened to this extent before. Son as helped him with his meds because he doesn't think his father was in his right mind to manage them by himself which is very abnormal for him.    Altered Mental Status      Home Medications Prior to Admission medications   Medication Sig Start Date End Date Taking? Authorizing Provider  carbidopa-levodopa (SINEMET CR) 50-200 MG tablet Take 1 tablet by mouth at bedtime. 10/24/21   Tat, Eustace Quail, DO  carbidopa-levodopa (SINEMET IR) 25-100 MG tablet TAKE 2 TABLETS BY MOUTH AT 7AM 11AM 3PM AND 1 TABLET AT 7PM 10/24/21   Tat, Eustace Quail, DO  Cholecalciferol 25 MCG (1000 UT) CHEW Chew 1 tablet (1,000 Units total) by mouth 2 (two) times daily.    [provider]  furosemide (LASIX) 20 MG tablet Take 1 tablet by mouth once daily 09/14/21   Carollee Herter, Alferd Apa, DO  levothyroxine (SYNTHROID) 100 MCG tablet TAKE 1 TABLET BY MOUTH ONCE DAILY BEFORE BREAKFAST 02/09/22   Roma Schanz R, DO  mirtazapine (REMERON) 7.5 MG tablet Take 1 tablet (7.5 mg total) by mouth at bedtime. 03/05/22    [name], DO  MYRBETRIQ 50 MG TB24 tablet Take 1 tablet by mouth once daily 02/24/22   Carollee Herter, Alferd Apa, DO  NONFORMULARY OR COMPOUNDED Peter [place] Walker  #1   Dx parkinson disease 09/23/18   Carollee Herter, Alferd Apa, DO  nystatin (NYSTATIN) powder Apply topically 4 (four) times daily. 03/03/19   [name], DO  QUEtiapine (SEROQUEL) 25 MG tablet Take 0.5 tablets (12.5 mg total) by mouth 2 (two) times daily. 04/26/22   Tat, Eustace Quail, DO  simvastatin (ZOCOR) 20 MG tablet TAKE 1 TABLET BY MOUTH ONCE DAILY IN THE EVENING 02/09/22   [name], DO      Allergies    Garlic and Niacin    Review of Systems   Review of Systems Review of systems Negative for f/c, positive for cough.  A 10 point review of systems was performed and is negative unless otherwise reported in HPI.  Physical Exam Updated Vital Signs BP (!) 161/79 (BP Location: Right Arm)   Pulse 62   Temp 97.7 F (36.5 C) (Oral)   Resp 18   Ht 6' 1"$  (1.854 m)   Wt 77.1 kg   SpO2 96%   BMI 22.43 kg/m  Physical Exam General: Normal appearing male, lying in bed.  HEENT: PERRLA, Sclera anicteric, MMM, trachea midline.  Cardiology: RRR, no murmurs/rubs/gallops. BL radial and DP pulses equal bilaterally.  Resp: Normal respiratory rate and effort. CTAB, no wheezes, rhonchi, crackles.  Abd: Soft, non-tender, non-distended. No rebound tenderness or guarding.  GU: Deferred. MSK: No peripheral edema or signs of trauma. Extremities without deformity or TTP. No cyanosis or clubbing. Skin: warm, dry. No rashes or lesions. Back: No CVA tenderness Neuro: A&Ox4, CNs II-XII grossly intact. MAEs. Sensation grossly intact.  Psych: Normal mood and affect.   ED Results / Procedures / Treatments   Labs (all labs ordered are listed, but only abnormal results are displayed) Labs Reviewed  COMPREHENSIVE METABOLIC PANEL  CBC  LACTIC ACID, PLASMA  LACTIC ACID, PLASMA    EKG None  Radiology No results  found.  Procedures Procedures    Medications Ordered in ED Medications - No data to display  ED Course/ Medical Decision Making/ A&P                          Medical Decision Making Amount and/or Complexity of Data Reviewed Labs: ordered. Decision-making details documented in ED Course. Radiology: ordered. Decision-making details documented in ED Course.  Risk Prescription drug management. Decision regarding hospitalization.    This patient presents to the ED for concern of AMS/confusion, urinary incontinence; this involves an extensive number of treatment options, and is a complaint that carries with it a high risk of complications and morbidity.  I considered the following differential and admission for this acute, potentially life threatening condition.   MDM:    Ddx of acute altered mental status or encephalopathy considered but not limited to: -Intracranial abnormalities such as ICH, hydrocephalus, especially NPH in s/o urinary incontinence/confusion, though no wobbly gait reported. Unclear if h/o head trauma though patient denies, but not good historian, will obtain CTH -Infection such as UTI, PNA - has had cough and urinary incontinence, could be source of increased confusion. -Pharmacy such as patient's carbidopa/levodopa - patient has had no recent med changes. D/w neurology who states likely not due to this, no reason levels should acutely increase w/ no dose changes. -Electrolyte abnormalities or hyper/hypoglycemia -Hypercarbia or hypoxia -Hepatic encephalopathy or uremia - no liver disease history -ACS or arrhythmia - no reported CP/SOB    Clinical Course as of 05/03/22 1543  Thu May 03, 2022  1400 Creatinine(!): 1.32 Mildly increased from 1.2 baseline [HN]  1400 Lactic Acid, Venous: 1.1 [HN]  1400 Glucose-Capillary: 75 [HN]  1400 Alcohol, Ethyl (B): <10 [HN]  1400 WBC: 8.2 No leukocytosis [HN]  1400 Hemoglobin(!): 11.4 Mildly decreased from 12 [HN]  1400  Rapid urine drug screen (hospital performed) Neg [HN]  1400 Urinalysis, w/ Reflex to Culture (Infection Suspected) -Urine, Clean Catch(!) Neg for UTI [HN]  1414 CT Head Wo Contrast IMPRESSION: Atrophy and chronic small vessel ischemic changes. No acute intracranial process identified.   [HN]  1501 No UTI, will d/w neurology [HN]  1505 Resp panel by RT-PCR (RSV, Flu A&B, Covid) Anterior Nasal Swab Neg [HN]  1521 D/w neurology who states that no reason his carbidopa/levodopa level should should increase suddenly or that he should acutely worsen from a parkinsonian dementia standpoint. Patient's imaging concerning for possible respiratory infection and patient is coughing. Patient is signed out to the oncoming ED physician Dr/.Zackowski who is made aware of his history, presentation, exam, workup, and plan. Will obtain CT chest and consider admission for encephalopathy.  [HN]    Clinical Course User Index [HN] Audley Hose, MD    Labs: I Ordered, and personally interpreted labs.  The pertinent results include:  those listed above  Imaging Studies ordered:  I ordered imaging studies including CXR, CTH I independently visualized and interpreted imaging. I agree with the radiologist interpretation  Additional history obtained from son at bedside,   Cardiac Monitoring: The patient was maintained on a cardiac monitor.  I personally viewed and interpreted the cardiac monitored which showed an underlying rhythm of: NSR  Reevaluation: After the interventions noted above, I reevaluated the patient and found that they have :stayed the same  Social Determinants of Health: Patient   Disposition:  TBD, likely admit pending CT chest  Co morbidities that complicate the patient evaluation  Past Medical History:  Diagnosis Date   Anemia 1952   post Oromycin for [place]mia   Anisocoria    post op   Arthritis    Autonomic dysfunction    GERD (gastroesophageal reflux disease)    Glaucoma     Dr Bing Plume   Hearing loss in right ear    Hypercholesteremia    Hyperthyroidism    s/p RAI   LVH (left ventricular hypertrophy)    Orthostatic hypotension    Parkinson's disease      Medicines No orders of the defined types were placed in this encounter.   I have reviewed the patients home medicines and have made adjustments as needed  Problem List / ED Course: Problem List Items Addressed This Visit       Other   Insomnia   Relevant Medications   mirtazapine (REMERON) 7.5 MG tablet   Other Visit Diagnoses     Transient alteration of awareness    -  Primary   Multifocal pneumonia       Relevant Medications   cefTRIAXone (ROCEPHIN) 1 g in sodium chloride 0.9 % 100 mL IVPB (Completed)   azithromycin (ZITHROMAX) 500 mg in sodium chloride 0.9 % 250 mL IVPB (Completed)   cefTRIAXone (ROCEPHIN) 2 g in sodium chloride 0.9 % 100 mL IVPB (Completed)   azithromycin (ZITHROMAX) tablet 500 mg (Completed)                   This note was created using dictation software, which may contain spelling or grammatical errors.    Audley Hose, MD 05/19/22 1754

## 2022-05-03 NOTE — Assessment & Plan Note (Signed)
Cont synthroid

## 2022-05-03 NOTE — ED Notes (Addendum)
Belongings: Two patient belonging bags being tx with client (bags labeled with patient sticker) pt has on [employer] and noted to have one yellow metal ring on left hand. Transport Team informed of pt belongings (son took pts own wheelchair home)

## 2022-05-03 NOTE — Assessment & Plan Note (Signed)
Cont sinemet NPO other than meds pending SLP evaluation for possible aspiration.

## 2022-05-03 NOTE — ED Notes (Signed)
Contact: Son [name] [phone]

## 2022-05-03 NOTE — ED Notes (Signed)
Presents with son to the ED for evaluation for change in his daily mentation. Pt was able to state his name and DOB without issues, follows commands without hesitation. Lab work obtained as per order

## 2022-05-03 NOTE — ED Notes (Signed)
HIGH FALL RISK CLIENT - son left , side rails x 2 up, bed in lowest position, remains on cont cardiac monitoring, nurse call bell within easy reach, explained how to use call bell again to client, explained to client the importance of calling staff if needing assistance and not to get up without staff in room. Door left open to room to provide constant observation of patient

## 2022-05-03 NOTE — Assessment & Plan Note (Addendum)
Suspect delirium secondary to PNA. Delirium precautions Cont seroquel, looks like this started by neuro for the delirium as outpt a couple of days ago. Treating PNA If no improvement in mental status then son will be unable to care for pt at home. PT/OT SW

## 2022-05-03 NOTE — ED Triage Notes (Signed)
Here with son who states pt has had increased confusion, hallucinations & incontinence x 5 days. Sent by provider. Hx parkinsons.

## 2022-05-03 NOTE — ED Provider Notes (Signed)
Patient with baseline Parkinson's and at times does have confusion.  Been much worse the last few days.  Chest x-ray raise concerns about possible pneumonia CT chest showed evidence of multifocal pneumonia.  I think this is the cause of his increased confusion.  Respiratory panel for COVID influenza and RSV is negative.     Fredia Sorrow, MD 05/03/22 [phone]

## 2022-05-04 ENCOUNTER — Observation Stay (HOSPITAL_COMMUNITY): Payer: HMO

## 2022-05-04 DIAGNOSIS — Z833 Family history of diabetes mellitus: Secondary | ICD-10-CM | POA: Diagnosis not present

## 2022-05-04 DIAGNOSIS — G9341 Metabolic encephalopathy: Secondary | ICD-10-CM | POA: Diagnosis not present

## 2022-05-04 DIAGNOSIS — Z8051 Family history of malignant neoplasm of kidney: Secondary | ICD-10-CM | POA: Diagnosis not present

## 2022-05-04 DIAGNOSIS — I10 Essential (primary) hypertension: Secondary | ICD-10-CM | POA: Diagnosis present

## 2022-05-04 DIAGNOSIS — G20A1 Parkinson's disease without dyskinesia, without mention of fluctuations: Secondary | ICD-10-CM | POA: Diagnosis present

## 2022-05-04 DIAGNOSIS — H9191 Unspecified hearing loss, right ear: Secondary | ICD-10-CM | POA: Diagnosis present

## 2022-05-04 DIAGNOSIS — Z801 Family history of malignant neoplasm of trachea, bronchus and lung: Secondary | ICD-10-CM | POA: Diagnosis not present

## 2022-05-04 DIAGNOSIS — Z1152 Encounter for screening for COVID-19: Secondary | ICD-10-CM | POA: Diagnosis not present

## 2022-05-04 DIAGNOSIS — R1310 Dysphagia, unspecified: Secondary | ICD-10-CM | POA: Diagnosis present

## 2022-05-04 DIAGNOSIS — R404 Transient alteration of awareness: Secondary | ICD-10-CM | POA: Diagnosis present

## 2022-05-04 DIAGNOSIS — E7800 Pure hypercholesterolemia, unspecified: Secondary | ICD-10-CM | POA: Diagnosis present

## 2022-05-04 DIAGNOSIS — F0282 Dementia in other diseases classified elsewhere, unspecified severity, with psychotic disturbance: Secondary | ICD-10-CM | POA: Diagnosis present

## 2022-05-04 DIAGNOSIS — Z888 Allergy status to other drugs, medicaments and biological substances status: Secondary | ICD-10-CM | POA: Diagnosis not present

## 2022-05-04 DIAGNOSIS — Z8249 Family history of ischemic heart disease and other diseases of the circulatory system: Secondary | ICD-10-CM | POA: Diagnosis not present

## 2022-05-04 DIAGNOSIS — E890 Postprocedural hypothyroidism: Secondary | ICD-10-CM | POA: Diagnosis present

## 2022-05-04 DIAGNOSIS — Z79890 Hormone replacement therapy (postmenopausal): Secondary | ICD-10-CM | POA: Diagnosis not present

## 2022-05-04 DIAGNOSIS — Z8262 Family history of osteoporosis: Secondary | ICD-10-CM | POA: Diagnosis not present

## 2022-05-04 DIAGNOSIS — Z91018 Allergy to other foods: Secondary | ICD-10-CM | POA: Diagnosis not present

## 2022-05-04 DIAGNOSIS — J189 Pneumonia, unspecified organism: Secondary | ICD-10-CM | POA: Diagnosis present

## 2022-05-04 DIAGNOSIS — Z79899 Other long term (current) drug therapy: Secondary | ICD-10-CM | POA: Diagnosis not present

## 2022-05-04 DIAGNOSIS — K219 Gastro-esophageal reflux disease without esophagitis: Secondary | ICD-10-CM | POA: Diagnosis present

## 2022-05-04 DIAGNOSIS — H409 Unspecified glaucoma: Secondary | ICD-10-CM | POA: Diagnosis present

## 2022-05-04 DIAGNOSIS — Z8042 Family history of malignant neoplasm of prostate: Secondary | ICD-10-CM | POA: Diagnosis not present

## 2022-05-04 DIAGNOSIS — Z800 Family history of malignant neoplasm of digestive organs: Secondary | ICD-10-CM | POA: Diagnosis not present

## 2022-05-04 DIAGNOSIS — F0280 Dementia in other diseases classified elsewhere without behavioral disturbance: Secondary | ICD-10-CM | POA: Diagnosis present

## 2022-05-04 DIAGNOSIS — Z87891 Personal history of nicotine dependence: Secondary | ICD-10-CM | POA: Diagnosis not present

## 2022-05-04 LAB — BASIC METABOLIC PANEL
Anion gap: 8 (ref 5–15)
BUN: 20 mg/dL (ref 8–23)
CO2: 26 mmol/L (ref 22–32)
Calcium: 8.3 mg/dL — ABNORMAL LOW (ref 8.9–10.3)
Chloride: 104 mmol/L (ref 98–111)
Creatinine, Ser: 1.17 mg/dL (ref 0.61–1.24)
GFR, Estimated: >60 mL/min (ref >60)
Glucose, Bld: 92 mg/dL (ref 70–99)
Potassium: 3.9 mmol/L (ref 3.5–5.1)
Sodium: 138 mmol/L (ref 135–145)

## 2022-05-04 LAB — HEPATITIS C ANTIBODY: HCV Ab: NONREACTIVE

## 2022-05-04 LAB — CBC
HCT: 32 % — ABNORMAL LOW (ref 39.0–52.0)
Hemoglobin: 10.4 g/dL — ABNORMAL LOW (ref 13.0–17.0)
MCH: 31 pg (ref 26.0–34.0)
MCHC: 32.5 g/dL (ref 30.0–36.0)
MCV: 95.2 fL (ref 80.0–100.0)
Platelets: 152 10*3/uL (ref 150–400)
RBC: 3.36 MIL/uL — ABNORMAL LOW (ref 4.22–5.81)
RDW: 12.3 % (ref 11.5–15.5)
WBC: 6.1 10*3/uL (ref 4.0–10.5)
nRBC: 0 % (ref 0.0–0.2)

## 2022-05-04 LAB — HEPATITIS B SURFACE ANTIGEN: Hepatitis B Surface Ag: NONREACTIVE

## 2022-05-04 LAB — HIV ANTIBODY (ROUTINE TESTING W REFLEX): HIV Screen 4th Generation wRfx: REACTIVE — AB

## 2022-05-04 MED ORDER — CARBIDOPA-LEVODOPA 25-100 MG PO TABS
1.0000 | ORAL_TABLET | Freq: Every day | ORAL | Status: DC
  Administered 2022-05-04 – 2022-05-10 (×7): 1 via ORAL
  Filled 2022-05-04 (×7): qty 1

## 2022-05-04 MED ORDER — GUAIFENESIN ER 600 MG PO TB12
600.0000 mg | ORAL_TABLET | Freq: Two times a day (BID) | ORAL | Status: DC
  Administered 2022-05-04 – 2022-05-11 (×15): 600 mg via ORAL
  Filled 2022-05-04 (×15): qty 1

## 2022-05-04 NOTE — Evaluation (Signed)
Occupational Therapy Evaluation Patient Details Name: William Williamson MRN: [id number] DOB: 27-Jun-1932 Today's Date: 05/04/2022   History of Present Illness 89 y.o. male adm 2/1 with medical history significant of Parkinson's disease.  Pt with cough, congestion, cough productive of mucus.  Increased confusion, urinary incontinence, hallucinations over past couple of days.   Clinical Impression   Patient admitted for the diagnosis above.  PTA he lives at home with his son, who is able to assist as needed.  Patient typically walks with a 4WRW, and states he was able to complete his own ADL.  Currently he is needing Supervision to [place] for ADL completion and in room mobility.  OT will follow in the acute setting to address deficits, and assist with eventual transition home.  [place] OT may not be needed, depending on family support and progress.        Recommendations for follow up therapy are one component of a multi-disciplinary discharge planning process, led by the attending physician.  Recommendations may be updated based on patient status, additional functional criteria and insurance authorization.   Follow Up Recommendations  Other (comment) ([place] OT depending on progress)     Assistance Recommended at Discharge Intermittent Supervision/Assistance  Patient can return home with the following Help with stairs or ramp for entrance;A little help with walking and/or transfers;A little help with bathing/dressing/bathroom;Assist for transportation;Assistance with cooking/housework    Functional Status Assessment  Patient has had a recent decline in their functional status and demonstrates the ability to make significant improvements in function in a reasonable and predictable amount of time.  Equipment Recommendations  None recommended by OT    Recommendations for Other Services       Precautions / Restrictions Precautions Precautions: Fall Restrictions Weight Bearing Restrictions: No       Mobility Bed Mobility Overal bed mobility: Needs Assistance Bed Mobility: Supine to Sit     Supine to sit: Supervision          Transfers Overall transfer level: Needs assistance Equipment used: Rolling walker (2 wheels) Transfers: Sit to/from Stand, Bed to chair/wheelchair/BSC Sit to Stand: Min guard     Step pivot transfers: Min guard, Min assist            Balance Overall balance assessment: Needs assistance Sitting-balance support: Feet supported Sitting balance-Leahy Scale: Fair     Standing balance support: Reliant on assistive device for balance Standing balance-Leahy Scale: Poor                             ADL either performed or assessed with clinical judgement   ADL       Grooming: Wash/dry hands;Sitting;Wash/dry face           Upper Body Dressing : Supervision/safety;Sitting   Lower Body Dressing: Minimal assistance;Sit to/from stand   Toilet Transfer: Minimal assistance;Rolling walker (2 wheels);Regular Toilet                   Vision Patient Visual Report: No change from baseline       Perception     Praxis      Pertinent Vitals/Pain Pain Assessment Pain Assessment: No/denies pain     Hand Dominance Right   Extremity/Trunk Assessment Upper Extremity Assessment Upper Extremity Assessment: Generalized weakness   Lower Extremity Assessment Lower Extremity Assessment: Defer to PT evaluation   Cervical / Trunk Assessment Cervical / Trunk Assessment: Kyphotic   Communication Communication Communication: No  difficulties   Cognition Arousal/Alertness: Awake/alert Behavior During Therapy: WFL for tasks assessed/performed Overall Cognitive Status: History of cognitive impairments - at baseline                                                        Home Living Family/patient expects to be discharged to:: Private residence Living Arrangements: Children Available Help at  Discharge: Family;Available 24 hours/day Type of Home: House Home Access: Stairs to enter [employer] of Steps: "a couple"   Home Layout: Multi-level;Able to live on main level with bedroom/bathroom     Bathroom Shower/Tub: Occupational psychologist: Handicapped height Bathroom Accessibility: Yes How Accessible: Accessible via walker Home Equipment: Rollator (4 wheels);Shower seat          Prior Functioning/Environment Prior Level of Function : Needs assist             Mobility Comments: Walks with a 4WRW ADLs Comments: Assist with iADL, community mobility and medications.  patient stating he generally cares for his own self care.        OT Problem List: Decreased strength;Decreased range of motion;Impaired balance (sitting and/or standing)      OT Treatment/Interventions: Self-care/ADL training;Therapeutic activities;Patient/family education;Balance training    OT Goals(Current goals can be found in the care plan section) Acute Rehab OT Goals Patient Stated Goal: Return home OT Goal Formulation: With patient Time For Goal Achievement: 05/18/22 Potential to Achieve Goals: Good ADL Goals Pt Will Perform Grooming: standing;with set-up Pt Will Perform Lower Body Dressing: with set-up;sit to/from stand Pt Will Transfer to Toilet: with modified independence;ambulating;regular height toilet  OT Frequency: Min 2X/week    Co-evaluation              AM-PAC OT "6 Clicks" Daily Activity     Outcome Measure Help from another person eating meals?: Total Help from another person taking care of personal grooming?: A Little Help from another person toileting, which includes using toliet, bedpan, or urinal?: A Little Help from another person bathing (including washing, rinsing, drying)?: A Little Help from another person to put on and taking off regular upper body clothing?: A Little Help from another person to put on and taking off regular lower body  clothing?: A Little 6 Click Score: 16   End of Session Equipment Utilized During Treatment: Rolling walker (2 wheels) Nurse Communication: Mobility status  Activity Tolerance: Patient tolerated treatment well Patient left: in chair;with call bell/phone within reach;with chair alarm set  OT Visit Diagnosis: Unsteadiness on feet (R26.81);History of falling (Z91.81);Muscle weakness (generalized) (M62.81)                Time: 5537-4827 OT Time Calculation (min): 19 min Charges:  OT General Charges $OT Visit: 1 Visit OT Evaluation $OT Eval Moderate Complexity: 1 Mod  05/04/2022  RP, OTR/L  Acute Rehabilitation Services  Office:  [phone]   Metta Clines 05/04/2022, 9:27 AM

## 2022-05-04 NOTE — Progress Notes (Signed)
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  Patient also during this timeframe has had a productive cough, congestion.  Per son, no overt aspiration episodes noted and no history of aspiration previously.  In the ED, temperature 97.7 F, HR 62, RR 18, BP 161/79, SpO2 96% on room air.  Sodium 134, potassium 4.1, chloride 100, CO2 28, glucose 99, BUN 29, creatinine 1.32.  WBC 8.2, hemoglobin 11.4, platelets 155.  Lactic acid 1.1.  Urinalysis unrevealing.  UDS negative.  EtOH level less than 10.  COVID/RSV/influenza PCR negative.  CT head without contrast with atrophy and chronic small vessel ischemic changes, no acute intracranial process identified.  CT chest without contrast with right middle lobe consolidation numerous solid nodules and patchy consolidations more pronounced in the bilateral lower lobes and right middle lobe likely due to aspiration versus infection, aortic atherosclerosis.  Assessment & Plan:   Acute metabolic encephalopathy Patient presenting to ED with increased confusion, hallucinations over the past several days.  UDS negative, EtOH level less than 10. Recent vitamin D and vitamin B12 level on 02/01/2022 within normal limits.  Was recently seen by his neurologist, Dr. Carles Collet on 1/25 with increase of Seroquel to 12.5 mg p.o. twice daily.  Concern for likely infectious etiology given CT chest concerning for pneumonia.  CT head with no acute findings.  COVID/RSV/influenza PCR negative.  Complicated by his history of underlying Parkinson's dementia. -- Continue treatment as below, supportive care  Community-acquired  pneumonia Patient presenting with confusion, productive cough.  CT chest without contrast with right middle lobe consolidation with numerous solid nodules and patchy consolidations concerning for aspiration versus infection. -- Azithromycin 500 mg IV q24h -- Ceftriaxone 2 g IV q24h -- Mucinex 600 mg PO BID -- Incentive spirometry/flutter valve -- SLP evaluation for concerns of possible aspiration, continue n.p.o. until evaluation -- LR at 75 mL/h -- Supportive care  Parkinson's disease/dementia Follows with neurology outpatient, Dr. Carles Collet; last seen in clinic on 1/25. -- Carbidopa/levodopa 2 tablets 7am/11am/3pm and 1 tablet at 7pm -- Carbidopa/levodopa 50/200 CR at bedtime -- Seroquel 12.5 mg p.o. twice daily -- Supportive care, fall precautions  Hypothyroidism TSH 1.78 on 02/01/2022. -- Levothyroxine 100 mcg p.o. daily  Hyperlipidemia -- Simvastatin 20 mg p.o. daily  Weakness/debility/gait disturbance: Per son, William Williamson; patient is currently on waiting list for [facility]. -- OT recommends home health -- PT evaluation: Pending -- TOC consult   DVT prophylaxis: enoxaparin (LOVENOX) injection 40 mg Start: 05/04/22 2000    Code Status: Full Code Family Communication: No family present at bedside this morning  Disposition Plan:  Level of care: Med-Surg .Status is: Inpatient Remains inpatient appropriate because: Continued confusion which is off from his typical baseline, IV antibiotics for commune acquired pneumonia, pending PT/OT/SLP evaluation   Consultants:  None  Procedures:  None  Antimicrobials:  Azithromycin 2/1>> Ceftriaxone 2/1>>   Subjective: Patient seen examined bedside, resting comfortably.  Lying in bed.  No family present.  Feels weak and fatigued.  Requesting Mucinex for continued cough.  Also with mild shortness of breath.  No other specific complaints or concerns at this time.  Denies headache, no visual changes,  no chest pain,  no palpitations, no abdominal pain, no fever/chills/night sweats, no nausea/vomiting/diarrhea, no focal weakness, no paresthesias.  No acute events overnight per nursing staff.  Objective: Vitals:   05/03/22 2059 05/04/22 0100 05/04/22 0530 05/04/22 0807  BP: (!) 156/79 (!) 166/74 (!) 147/76 (!) 157/76  Pulse: 70 66 72 63  Resp: '18 20 17 17  '$ Temp: 98.6 F (37 C) 98.3 F (36.8 C) 98.4 F (36.9 C) 98.4 F (36.9 C)  TempSrc: Oral Oral Oral Oral  SpO2: 94% 96% 94% (!) 82%  Weight: 67 kg     Height:        Intake/Output Summary (Last 24 hours) at 05/04/2022 1024 Last data filed at 05/04/2022 0400 Gross per 24 hour  Intake 162.96 ml  Output 575 ml  Net -412.04 ml   Filed Weights   05/03/22 1234 05/03/22 2059  Weight: 77.1 kg 67 kg    Examination:  Physical Exam: GEN: NAD, alert and oriented x 3, thin in appearance, slight resting tremor noted HEENT: NCAT, PERRL, EOMI, sclera clear, MMM PULM: Breath sounds slight diminished bilateral bases, coarse breath sounds right midlung to base, normal [place] effort without accessory muscle use, no wheezing, on room air with SpO2 94% at rest. CV: RRR w/o M/G/R GI: abd soft, NTND, NABS, no R/G/M MSK: no peripheral edema, moves extremities independently NEURO: Slight resting tremor noted from underlying Parkinson disease, otherwise no focal deficits appreciated PSYCH: Depressed mood, flat affect Integumentary: dry/intact, no rashes or wounds    Data Reviewed: I have personally reviewed following labs and imaging studies  CBC: Recent Labs  Lab 05/03/22 1307 05/04/22 0649  WBC 8.2 6.1  HGB 11.4* 10.4*  HCT 34.2* 32.0*  MCV 94.2 95.2  PLT 155 546   Basic Metabolic Panel: Recent Labs  Lab 05/03/22 1307 05/04/22 0649  NA 134* 138  K 4.1 3.9  CL 100 104  CO2 28 26  GLUCOSE 99 92  BUN 29* 20  CREATININE 1.32* 1.17  CALCIUM 8.6* 8.3*   GFR: Estimated Creatinine Clearance: 42.2 mL/min (by C-G formula based on SCr of 1.17  mg/dL). Liver Function Tests: Recent Labs  Lab 05/03/22 1307  AST 26  ALT <5  ALKPHOS 51  BILITOT 0.9  PROT 7.0  ALBUMIN 3.5   No results for input(s): "LIPASE", "AMYLASE" in the last 168 hours. No results for input(s): "AMMONIA" in the last 168 hours. Coagulation Profile: No results for input(s): "INR", "PROTIME" in the last 168 hours. Cardiac Enzymes: No results for input(s): "CKTOTAL", "CKMB", "CKMBINDEX", "TROPONINI" in the last 168 hours. BNP (last 3 results) No results for input(s): "PROBNP" in the last 8760 hours. HbA1C: No results for input(s): "HGBA1C" in the last 72 hours. CBG: Recent Labs  Lab 05/03/22 1339  GLUCAP 75   Lipid Profile: No results for input(s): "CHOL", "HDL", "LDLCALC", "TRIG", "CHOLHDL", "LDLDIRECT" in the last 72 hours. Thyroid Function Tests: No results for input(s): "TSH", "T4TOTAL", "FREET4", "T3FREE", "THYROIDAB" in the last 72 hours. Anemia Panel: No results for input(s): "VITAMINB12", "FOLATE", "FERRITIN", "TIBC", "IRON", "RETICCTPCT" in the last 72 hours. Sepsis Labs: Recent Labs  Lab 05/03/22 1307  LATICACIDVEN 1.1    Recent Results (from the past 240 hour(s))  Resp panel by RT-PCR (RSV, Flu A&B, Covid) Anterior Nasal Swab     Status: None   Collection Time: 05/03/22  2:15 PM   Specimen: Anterior Nasal Swab  Result Value Ref Range Status   SARS Coronavirus 2 by RT PCR NEGATIVE NEGATIVE Final  Comment: (NOTE) SARS-CoV-2 target nucleic acids are NOT DETECTED.  The SARS-CoV-2 RNA is generally detectable in upper respiratory specimens during the acute phase of infection. The lowest concentration of SARS-CoV-2 viral copies this assay can detect is 138 copies/mL. A negative result does not preclude SARS-Cov-2 infection and should not be used as the sole basis for treatment or other patient management decisions. A negative result may occur with  improper specimen collection/handling, submission of specimen other than  nasopharyngeal swab, presence of viral mutation(s) within the areas targeted by this assay, and inadequate number of viral copies(<138 copies/mL). A negative result must be combined with clinical observations, patient history, and epidemiological information. The expected result is Negative.  Fact Sheet for Patients:  EntrepreneurPulse.com.au  Fact Sheet for Healthcare Providers:  IncredibleEmployment.be  This test is no t yet approved or cleared by the [place] FDA and  has been authorized for detection and/or diagnosis of SARS-CoV-2 by FDA under an Emergency Use Authorization (EUA). This EUA will remain  in effect (meaning this test can be used) for the duration of the COVID-19 declaration under Section 564(b)(1) of the Act, 21 U.S.C.section 360bbb-3(b)(1), unless the authorization is terminated  or revoked sooner.       Influenza A by PCR NEGATIVE NEGATIVE Final   Influenza B by PCR NEGATIVE NEGATIVE Final    Comment: (NOTE) The Xpert Xpress SARS-CoV-2/FLU/RSV plus assay is intended as an aid in the diagnosis of influenza from Nasopharyngeal swab specimens and should not be used as a sole basis for treatment. Nasal washings and aspirates are unacceptable for Xpert Xpress SARS-CoV-2/FLU/RSV testing.  Fact Sheet for Patients: EntrepreneurPulse.com.au  Fact Sheet for Healthcare Providers: IncredibleEmployment.be  This test is not yet approved or cleared by the [place] FDA and has been authorized for detection and/or diagnosis of SARS-CoV-2 by FDA under an Emergency Use Authorization (EUA). This EUA will remain in effect (meaning this test can be used) for the duration of the COVID-19 declaration under Section 564(b)(1) of the Act, 21 U.S.C. section 360bbb-3(b)(1), unless the authorization is terminated or revoked.     Resp Syncytial Virus by PCR NEGATIVE NEGATIVE Final    Comment:  (NOTE) Fact Sheet for Patients: EntrepreneurPulse.com.au  Fact Sheet for Healthcare Providers: IncredibleEmployment.be  This test is not yet approved or cleared by the [place] FDA and has been authorized for detection and/or diagnosis of SARS-CoV-2 by FDA under an Emergency Use Authorization (EUA). This EUA will remain in effect (meaning this test can be used) for the duration of the COVID-19 declaration under Section 564(b)(1) of the Act, 21 U.S.C. section 360bbb-3(b)(1), unless the authorization is terminated or revoked.  Performed at [facility], [address] [place], [place] [address]          Radiology Studies: CT Chest W Contrast  Result Date: 05/03/2022 CLINICAL DATA:  Pneumonia suspected EXAM: CT CHEST WITH CONTRAST TECHNIQUE: Multidetector CT imaging of the chest was performed during intravenous contrast administration. RADIATION DOSE REDUCTION: This exam was performed according to the departmental dose-optimization program which includes automated exposure control, adjustment of the mA and/or kV according to patient size and/or use of iterative reconstruction technique. CONTRAST:  67m OMNIPAQUE IOHEXOL 300 MG/ML  SOLN COMPARISON:  Chest CT dated Aug 16, 2006 FINDINGS: Cardiovascular: Normal heart size. No pericardial effusion. Normal caliber thoracic aorta with moderate atherosclerotic disease. Severe coronary artery calcifications. Mediastinum/Nodes: Patulous esophagus. Thyroid is unremarkable. Mildly enlarged AP window lymph node measuring 10 mm in short axis  on series 7, image 72, likely reactive. No pathologically enlarged lymph nodes seen in the chest Lungs/Pleura: Central airways are patent. Right middle lobe consolidation and numerous solid nodules and patchy consolidations which are most pronounced pronounced in the bilateral lower lobe and right middle lobe. No pleural effusion pneumothorax. Upper Abdomen: No acute  abnormality. Musculoskeletal: No chest wall abnormality. No acute or significant osseous findings. IMPRESSION: 1. Right middle lobe consolidation and numerous solid nodules and patchy consolidations which are most pronounced in the bilateral lower lobes and right middle lobe, findings are likely due to aspiration or infection. Recommend follow-up chest CT in 3 months to ensure resolution. 2. Mildly enlarged AP window lymph node which is likely reactive. Recommend attention on follow-up 3. Aortic Atherosclerosis (ICD10-I70.0). Electronically Signed   By: Yetta Glassman M.D.   On: 05/03/2022 16:31   DG Chest 2 View  Result Date: 05/03/2022 CLINICAL DATA:  Cough, altered mental status. EXAM: CHEST - 2 VIEW COMPARISON:  None Available. FINDINGS: Well-expanded lungs. Patchy opacities in the lower lobes seen only on the lateral view may represent atelectasis, aspiration or scarring. Normal heart size. Atherosclerotic calcifications of the aortic arch and tortuosity of the descending thoracic aorta. No pleural effusion or pneumothorax. Visualized bones and upper abdomen are unremarkable. IMPRESSION: Patchy opacities in the lower lobes seen only on the lateral view may represent atelectasis, scarring or aspiration. Electronically Signed   By: Emmit Alexanders M.D.   On: 05/03/2022 14:49   CT Head Wo Contrast  Result Date: 05/03/2022 CLINICAL DATA:  Mental status change, unknown cause EXAM: CT HEAD WITHOUT CONTRAST TECHNIQUE: Contiguous axial images were obtained from the base of the skull through the vertex without intravenous contrast. RADIATION DOSE REDUCTION: This exam was performed according to the departmental dose-optimization program which includes automated exposure control, adjustment of the mA and/or kV according to patient size and/or use of iterative reconstruction technique. COMPARISON:  None Available. FINDINGS: Brain: There is periventricular white matter decreased attenuation consistent with small  vessel ischemic changes. Ventricles, sulci and cisterns are prominent consistent with age related involutional changes. No acute intracranial hemorrhage, mass effect or shift. No hydrocephalus. Vascular: No hyperdense vessel or unexpected calcification. Skull: Normal. Negative for fracture or focal lesion. Sinuses/Orbits: Mucoperiosteal thickening consistent with chronic maxillary, ethmoid and frontal sinusitis. No acute finding. IMPRESSION: Atrophy and chronic small vessel ischemic changes. No acute intracranial process identified. Electronically Signed   By: Sammie Bench M.D.   On: 05/03/2022 14:08        Scheduled Meds:  carbidopa-levodopa  1 tablet Oral QHS   carbidopa-levodopa  1 tablet Oral Daily   carbidopa-levodopa  2 tablet Oral TID   enoxaparin (LOVENOX) injection  40 mg Subcutaneous Q24H   guaiFENesin  600 mg Oral BID   levothyroxine  100 mcg Oral Q0600   mirtazapine  7.5 mg Oral QHS   QUEtiapine  12.5 mg Oral BID   simvastatin  20 mg Oral QPM   Continuous Infusions:  sodium chloride Stopped (05/03/22 1944)   azithromycin     cefTRIAXone (ROCEPHIN)  IV 2 g (05/04/22 0824)   lactated ringers 75 mL/hr at 05/04/22 0154     LOS: 0 days    Time spent: 52 minutes spent on chart review, discussion with nursing staff, consultants, updating family and interview/physical exam; more than 50% of that time was spent in counseling and/or coordination of care.    Brace Welte J [place] DO [facility] Available via Epic secure chat 7am-7pm After these  hours, please refer to coverage provider listed on [email or web address] 05/04/2022, 10:24 AM

## 2022-05-04 NOTE — Progress Notes (Signed)
Modified Barium Swallow Progress Note  Patient Details  Name: LINSEY HIROTA MRN: [id number] Date of Birth: 1932-08-15  Today's Date: 05/04/2022  Modified Barium Swallow completed.  Full report located under Chart Review in the Imaging Section.  Brief recommendations include the following:  Clinical Impression  Pt has what appears to be a mild progression of his dysphagia since last MBS in 2019, still with pharyngeal residuals across all consistencies but now aspirating on thin liquid residue. Premature spillage is noted with thin liquids, but overall there is some limited visibility of the oral phase throughout testing. Pharyngeally there is reduced base of tongue retraction, pharyngeal squeeze, and epiglottic inversion. Residue is noted througout the valleculae and lateral channels down through the pyriform sinuses. When he opens his airway back up after swallowing or in between boluses, thin liquid residue spills into his open aiway and results in silent aspiration. Despite increasing residuals, there is no other airway invasion with thicker consistencies. He performs multiple swallow to gradually reduce residue present. Recommend starting with Dys 2 diet and nectar thick liquids. Will f/u for trial of dysphagia therapy given suspicion for acute on chronic deficits.   Swallow Evaluation Recommendations       SLP Diet Recommendations: Dysphagia 2 (Fine chop) solids;Nectar thick liquid   Liquid Administration via: Cup;Straw   Medication Administration: Crushed with puree   Supervision: Intermittent supervision to cue for compensatory strategies;Comment (set-up assistance)   Compensations: Minimize environmental distractions;Slow rate;Small sips/bites   Postural Changes: Seated upright at 90 degrees;Remain semi-upright after after feeds/meals (Comment)   Oral Care Recommendations: Oral care BID   Other Recommendations: Order thickener from pharmacy;Prohibited food (jello, ice  cream, thin soups);Remove water pitcher    Osie Bond., M.A. [place] Office [phone]  Secure chat preferred  05/04/2022,2:32 PM

## 2022-05-04 NOTE — Progress Notes (Signed)
Physical Therapy Evaluation Patient Details Name: William Williamson MRN: [id number] DOB: 1932-09-06 Today's Date: 05/04/2022  History of Present Illness  89 y.o. male adm 2/1 with medical history significant of Parkinson's disease.  Pt with cough, congestion, cough productive of mucus.  Increased confusion, urinary incontinence, hallucinations over past couple of days. PMHx:  atherosclerosis, tremors, R ear hearing loss, hyperthyroidism, LVH, glaucoma, orthostatic hypotension, autonomic dysfunction  Clinical Impression  Pt was seen for mobility with focus on standing balance and safety with walker.  Has been home alone per pt with son at work, and able to self mobilize on rollator. However has history of mult falls, and will recommend him to go to SNF for strengthening and balance training to get home safely.  May need more help at home given his cognitive issues, esp of history and recent PLOF.  Follow acutely for goals of PT.       Recommendations for follow up therapy are one component of a multi-disciplinary discharge planning process, led by the attending physician.  Recommendations may be updated based on patient status, additional functional criteria and insurance authorization.  Follow Up Recommendations Skilled nursing-short term rehab (<3 hours/day) Can patient physically be transported by private vehicle: No    Assistance Recommended at Discharge Frequent or constant Supervision/Assistance  Patient can return home with the following  A lot of help with walking and/or transfers;A lot of help with bathing/dressing/bathroom;Assistance with cooking/housework;Direct supervision/assist for medications management;Direct supervision/assist for financial management;Assist for transportation;Help with stairs or ramp for entrance    Equipment Recommendations None recommended by PT  Recommendations for Other Services       Functional Status Assessment Patient has had a recent decline in  their functional status and demonstrates the ability to make significant improvements in function in a reasonable and predictable amount of time.     Precautions / Restrictions Precautions Precautions: Fall Precaution Comments: confusion Restrictions Weight Bearing Restrictions: No      Mobility  Bed Mobility               General bed mobility comments: in chair when PT arrived    Transfers Overall transfer level: Needs assistance Equipment used: Rolling walker (2 wheels) Transfers: Sit to/from Stand Sit to Stand: Mod assist (from recliner)           General transfer comment: pt is weak from lower surfaces, more capable per OT note from higher surfaces    Ambulation/Gait Ambulation/Gait assistance: Min guard, Min assist Gait Distance (Feet): 36 Feet (12 x 3) Assistive device: Rolling walker (2 wheels) Gait Pattern/deviations: Step-through pattern, Step-to pattern, Decreased stride length, Wide base of support, Trunk flexed Gait velocity: reduced Gait velocity interpretation: <1.31 ft/sec, indicative of household ambulator Pre-gait activities: standing balance ck General Gait Details: pt is more confident walking forward but backing up is more insecure per pt  Stairs            Wheelchair Mobility    Modified Rankin (Stroke Patients Only)       Balance Overall balance assessment: Needs assistance Sitting-balance support: Feet supported Sitting balance-Leahy Scale: Fair     Standing balance support: Bilateral upper extremity supported, During functional activity Standing balance-Leahy Scale: Poor                               Pertinent Vitals/Pain Pain Assessment Pain Assessment: Faces Faces Pain Scale: Hurts a little bit Pain Location: skin on back  and peri area Pain Descriptors / Indicators: Guarding Pain Intervention(s): Monitored during session, Repositioned    Home Living Family/patient expects to be discharged to::  Private residence Living Arrangements: Children Available Help at Discharge: Family;Available 24 hours/day Type of Home: House Home Access: Stairs to enter   [employer] of Steps: "a couple"   Home Layout: Multi-level;Able to live on main level with bedroom/bathroom Home Equipment: Rollator (4 wheels);Shower seat;Cane - single point Additional Comments: pt reports gait is typically on rollator but has mult recent falls    Prior Function Prior Level of Function : Needs assist       Physical Assist : Mobility (physical) Mobility (physical): Gait   Mobility Comments: Walks with a 4WRW ADLs Comments: does self care, home while son at work     Hand Dominance   Dominant Hand: Right    Extremity/Trunk Assessment   Upper Extremity Assessment Upper Extremity Assessment: Defer to OT evaluation    Lower Extremity Assessment Lower Extremity Assessment: Generalized weakness    Cervical / Trunk Assessment Cervical / Trunk Assessment: Kyphotic  Communication   Communication: HOH  Cognition Arousal/Alertness: Awake/alert Behavior During Therapy: WFL for tasks assessed/performed Overall Cognitive Status: History of cognitive impairments - at baseline                                 General Comments: history an issue with time frames of changes, unable to talk much about help at hoem        General Comments General comments (skin integrity, edema, etc.): pt is confused in chair when PT arrived, declared he did not want to return to bed yet but cannot give history and is unable to safety attend to gait including limits of distance    Exercises     Assessment/Plan    PT Assessment Patient needs continued PT services  PT Problem List Decreased strength;Decreased range of motion;Decreased activity tolerance;Decreased balance;Decreased mobility;Decreased coordination;Decreased cognition;Decreased knowledge of use of DME;Decreased safety awareness        PT Treatment Interventions DME instruction;Gait training;Stair training;Functional mobility training;Therapeutic activities;Therapeutic exercise;Balance training;Neuromuscular re-education;Patient/family education    PT Goals (Current goals can be found in the Care Plan section)  Acute Rehab PT Goals Patient Stated Goal: to walk again PT Goal Formulation: With patient Time For Goal Achievement: 05/18/22 Potential to Achieve Goals: Good    Frequency Min 3X/week     Co-evaluation               AM-PAC PT "6 Clicks" Mobility  Outcome Measure Help needed turning from your back to your side while in a flat bed without using bedrails?: A Lot Help needed moving from lying on your back to sitting on the side of a flat bed without using bedrails?: A Lot Help needed moving to and from a bed to a chair (including a wheelchair)?: A Lot Help needed standing up from a chair using your arms (e.g., wheelchair or bedside chair)?: A Lot Help needed to walk in hospital room?: A Little Help needed climbing 3-5 steps with a railing? : Total 6 Click Score: 12    End of Session Equipment Utilized During Treatment: Gait belt Activity Tolerance: Patient limited by fatigue;Treatment limited secondary to medical complications (Comment) Patient left: in chair;with call bell/phone within reach;with chair alarm set Nurse Communication: Mobility status PT Visit Diagnosis: Unsteadiness on feet (R26.81);Muscle weakness (generalized) (M62.81);Difficulty in walking, not elsewhere classified (R26.2);History of  falling (Z91.81);Repeated falls (R29.6)    Time: 1050-1110 PT Time Calculation (min) (ACUTE ONLY): 20 min   Charges:   PT Evaluation $PT Eval Moderate Complexity: 1 Mod         Ramond Dial 05/04/2022, 12:08 PM  Mee Hives, PT PhD Acute Rehab Dept. Number: [place] and [place]

## 2022-05-04 NOTE — Progress Notes (Signed)
New Admission Note:   Arrival Method: [facility] Mental Orientation: alertx1 Telemetry: none Assessment: Completed Skin: see flowsheet IV: left AC Pain: none Tubes: none Safety Measures: Safety Fall Prevention Plan has been discussed Admission: Completed [facility] Orientation: Patient has been orientated to the room, unit and staff.  Family: family at bedside  Orders have been reviewed and implemented. Will continue to monitor the patient. Call light has been placed within reach and bed alarm has been activated.   Rockie Neighbours BSN, RN Phone number: [phone]

## 2022-05-04 NOTE — Evaluation (Signed)
Clinical/Bedside Swallow Evaluation Patient Details  Name: William Williamson MRN: [id number] Date of Birth: April 08, 1932  Today's Date: 05/04/2022 Time: SLP Start Time (ACUTE ONLY): 0931 SLP Stop Time (ACUTE ONLY): 0957 SLP Time Calculation (min) (ACUTE ONLY): 26 min  Past Medical History:  Past Medical History:  Diagnosis Date   Anemia 1952   post Oromycin for [place]mia   Anisocoria    post op   Arthritis    Autonomic dysfunction    GERD (gastroesophageal reflux disease)    Glaucoma    Dr Bing Plume   Hearing loss in right ear    Hypercholesteremia    Hyperthyroidism    s/p RAI   LVH (left ventricular hypertrophy)    Orthostatic hypotension    Parkinson's disease    Past Surgical History:  Past Surgical History:  Procedure Laterality Date   CARDIAC CATHETERIZATION  07/2006   Dr.  Eustace Quail, negative   CATARACT EXTRACTION, BILATERAL     OS retinal surgery for post op floaters   COLONOSCOPY     negative X 3   RAI ablation  02/2008   hyperthyroidism   SEPTOPLASTY  1970   VASECTOMY     HPI:  89 y.o. male adm 2/1 with cough, congestion, cough productive of mucus. Increased confusion, urinary incontinence, hallucinations over past couple of days. CT showed R middle lobe consolidation and numerous solid nodules and patchy consolidations which are most pronounced in the bilateral lower lobes and R middle lobes. Findings likely due to aspiration or infection. Pt had an OP MBS in 2019 that showed mild oral and moderate pharyngeal dysphagia that included "impaired pharyngeal contraction, tongue base retraction and laryngeal elevation results in residuals across all consistencies." He did not aspirate or frankly penetrate but various postures did not reduce residue. He was recommended to have OP SLP but pt says he did not pursue this. PMH: Parkinson's disease, GERD, hearing loss R ear, glaucoma, hyperthyroidism    Assessment / Plan / Recommendation  Clinical Impression  Pt has a h/o  dysphagia per MBS in 2019, but denies any subjective symptoms of dysphagia recently. During PO trials he exhibits multiple subswallows, noted the most with thin liquids, which also elicit delayed throat clearing fairly consistently. Given known hx without completion of recommended dysphagia therapy, now in the setting of deconditioning and PNA, recommend proceeding with MBS to better evaluate oropharyngeal function before initiating full PO diet. Would allow small sips of water and bites of purees from the floor stock pending completion, as study is scheduled with radiology for today. SLP Visit Diagnosis: Dysphagia, unspecified (R13.10)    Aspiration Risk  Moderate aspiration risk    Diet Recommendation NPO except meds;Other (Comment) (can have small sips of water and snacks of purees from floor stock pending MBS)   Liquid Administration via: Cup;Straw Medication Administration: Crushed with puree Supervision: Staff to assist with self feeding Compensations: Minimize environmental distractions;Slow rate;Small sips/bites Postural Changes: Seated upright at 90 degrees;Remain upright for at least 30 minutes after po intake    Other  Recommendations Oral Care Recommendations: Oral care BID    Recommendations for follow up therapy are one component of a multi-disciplinary discharge planning process, led by the attending physician.  Recommendations may be updated based on patient status, additional functional criteria and insurance authorization.  Follow up Recommendations  (TBA)      Assistance Recommended at Discharge    Functional Status Assessment Patient has had a recent decline in their functional status and demonstrates the  ability to make significant improvements in function in a reasonable and predictable amount of time.  Frequency and Duration            Prognosis Prognosis for Safe Diet Advancement: Good      Swallow Study   General HPI: 89 y.o. male adm 2/1 with cough,  congestion, cough productive of mucus. Increased confusion, urinary incontinence, hallucinations over past couple of days. CT showed R middle lobe consolidation and numerous solid nodules and patchy consolidations which are most pronounced in the bilateral lower lobes and R middle lobes. Findings likely due to aspiration or infection. Pt had an OP MBS in 2019 that showed mild oral and moderate pharyngeal dysphagia that included "impaired pharyngeal contraction, tongue base retraction and laryngeal elevation results in residuals across all consistencies." He did not aspirate or frankly penetrate but various postures did not reduce residue. He was recommended to have OP SLP but pt says he did not pursue this. PMH: Parkinson's disease, GERD, hearing loss R ear, glaucoma, hyperthyroidism Type of Study: Bedside Swallow Evaluation Previous Swallow Assessment: see HPI Diet Prior to this Study: NPO Temperature Spikes Noted: No Respiratory Status: Room air History of Recent Intubation: No Behavior/Cognition: Alert;Cooperative;Pleasant mood Oral Cavity Assessment: Within Functional Limits Oral Care Completed by SLP: No Oral Cavity - Dentition: Adequate natural dentition Vision: Functional for self-feeding Self-Feeding Abilities: Needs assist Patient Positioning: Upright in chair Baseline Vocal Quality: Other (comment) (monotone) Volitional Cough: Weak;Congested Volitional Swallow: Able to elicit    Oral/Motor/Sensory Function Overall Oral Motor/Sensory Function: Within functional limits   Ice Chips Ice chips: Not tested   Thin Liquid Thin Liquid: Impaired Presentation: Cup;Self Fed;Straw Pharyngeal  Phase Impairments: Multiple swallows;Throat Clearing - Delayed    Nectar Thick Nectar Thick Liquid: Not tested   Honey Thick Honey Thick Liquid: Not tested   Puree Puree: Impaired Presentation: Spoon Pharyngeal Phase Impairments: Multiple swallows   Solid     Solid: Impaired Presentation: Self  Fed Pharyngeal Phase Impairments: Multiple swallows      Osie Bond., M.A. [place] Office [phone]  Secure chat preferred  05/04/2022,10:12 AM

## 2022-05-05 LAB — HEPATITIS A ANTIBODY, TOTAL: hep A Total Ab: REACTIVE — AB

## 2022-05-05 LAB — HIV-1 RNA QUANT-NO REFLEX-BLD
HIV 1 RNA Quant: <20 copies/mL
LOG10 HIV-1 RNA: UNDETERMINED log10copy/mL

## 2022-05-05 LAB — CBC
HCT: 31.3 % — ABNORMAL LOW (ref 39.0–52.0)
Hemoglobin: 10.6 g/dL — ABNORMAL LOW (ref 13.0–17.0)
MCH: 31.8 pg (ref 26.0–34.0)
MCHC: 33.9 g/dL (ref 30.0–36.0)
MCV: 94 fL (ref 80.0–100.0)
Platelets: 160 10*3/uL (ref 150–400)
RBC: 3.33 MIL/uL — ABNORMAL LOW (ref 4.22–5.81)
RDW: 12.2 % (ref 11.5–15.5)
WBC: 5.1 10*3/uL (ref 4.0–10.5)
nRBC: 0 % (ref 0.0–0.2)

## 2022-05-05 LAB — BASIC METABOLIC PANEL
Anion gap: 8 (ref 5–15)
BUN: 14 mg/dL (ref 8–23)
CO2: 25 mmol/L (ref 22–32)
Calcium: 8.2 mg/dL — ABNORMAL LOW (ref 8.9–10.3)
Chloride: 107 mmol/L (ref 98–111)
Creatinine, Ser: 1.06 mg/dL (ref 0.61–1.24)
GFR, Estimated: >60 mL/min (ref >60)
Glucose, Bld: 95 mg/dL (ref 70–99)
Potassium: 3.5 mmol/L (ref 3.5–5.1)
Sodium: 140 mmol/L (ref 135–145)

## 2022-05-05 LAB — HEPATITIS B SURFACE ANTIBODY, QUANTITATIVE: Hep B S AB Quant (Post): <3.1 m[IU]/mL — ABNORMAL LOW (ref >9.9)

## 2022-05-05 LAB — MAGNESIUM: Magnesium: 2 mg/dL (ref 1.7–2.4)

## 2022-05-05 MED ORDER — HYDRALAZINE HCL 10 MG PO TABS
10.0000 mg | ORAL_TABLET | Freq: Three times a day (TID) | ORAL | Status: DC | PRN
  Administered 2022-05-07: 10 mg via ORAL
  Filled 2022-05-05: qty 1

## 2022-05-05 MED ORDER — MELATONIN 3 MG PO TABS
3.0000 mg | ORAL_TABLET | Freq: Every day | ORAL | Status: DC
  Administered 2022-05-05 – 2022-05-10 (×6): 3 mg via ORAL
  Filled 2022-05-05 (×6): qty 1

## 2022-05-05 MED ORDER — POTASSIUM CHLORIDE CRYS ER 20 MEQ PO TBCR
40.0000 meq | EXTENDED_RELEASE_TABLET | Freq: Once | ORAL | Status: AC
  Administered 2022-05-05: 40 meq via ORAL
  Filled 2022-05-05: qty 2

## 2022-05-05 NOTE — Progress Notes (Signed)
PROGRESS NOTE    William Williamson  HUT:[id number] DOB: 1932-06-25 DOA: 05/03/2022 PCP: [name], DO    Brief Narrative:   William Williamson is a 89 y.o. male [employer] veteran with past medical history significant for Parkinson's disease/dementia, hypothyroidism, hyperlipidemia who presented to [facility] ED on 2/1 with increased confusion, hallucinations over the past 5 days.  Patient also during this timeframe has had a productive cough, congestion.  Per son, no overt aspiration episodes noted and no history of aspiration previously.  In the ED, temperature 97.7 F, HR 62, RR 18, BP 161/79, SpO2 96% on room air.  Sodium 134, potassium 4.1, chloride 100, CO2 28, glucose 99, BUN 29, creatinine 1.32.  WBC 8.2, hemoglobin 11.4, platelets 155.  Lactic acid 1.1.  Urinalysis unrevealing.  UDS negative.  EtOH level less than 10.  COVID/RSV/influenza PCR negative.  CT head without contrast with atrophy and chronic small vessel ischemic changes, no acute intracranial process identified.  CT chest without contrast with right middle lobe consolidation numerous solid nodules and patchy consolidations more pronounced in the bilateral lower lobes and right middle lobe likely due to aspiration versus infection, aortic atherosclerosis.  Assessment & Plan:   Acute metabolic encephalopathy Patient presenting to ED with increased confusion, hallucinations over the past several days.  UDS negative, EtOH level less than 10. Recent vitamin D and vitamin B12 level on 02/01/2022 within normal limits.  Was recently seen by his neurologist, Dr. Carles Collet on 1/25 with increase of Seroquel to 12.5 mg p.o. twice daily.  Concern for likely infectious etiology given CT chest concerning for pneumonia.  CT head with no acute findings.  COVID/RSV/influenza PCR negative.  Complicated by his history of underlying Parkinson's dementia. -- Continue treatment as below, supportive care  Community-acquired  pneumonia Patient presenting with confusion, productive cough.  CT chest without contrast with right middle lobe consolidation with numerous solid nodules and patchy consolidations concerning for aspiration versus infection. -- Azithromycin 500 mg IV q24h -- Ceftriaxone 2 g IV q24h -- Mucinex 600 mg PO BID -- Incentive spirometry/flutter valve -- SLP evaluation for concerns of possible aspiration, continue n.p.o. until evaluation -- LR at 75 mL/h -- Supportive care  Parkinson's disease/dementia Follows with neurology outpatient, Dr. Carles Collet; last seen in clinic on 1/25. -- Carbidopa/levodopa 2 tablets 7am/11am/3pm and 1 tablet at 7pm -- Carbidopa/levodopa 50/200 CR at bedtime -- Seroquel 12.5 mg p.o. twice daily -- Melatonin 3 mg p.o. nightly -- Supportive care, fall precautions  Hypothyroidism TSH 1.78 on 02/01/2022. -- Levothyroxine 100 mcg p.o. daily  Hyperlipidemia -- Simvastatin 20 mg p.o. daily  Weakness/debility/gait disturbance: Per son, William Williamson; patient is currently [facility] for [facility]. -- OT recommends home health -- PT evaluation: Recommends SNF placement -- TOC consult   DVT prophylaxis: enoxaparin (LOVENOX) injection 40 mg Start: 05/04/22 2000    Code Status: Full Code Family Communication: Updated patient's son William Williamson at bedside this morning  Disposition Plan:  Level of care: Med-Surg .Status is: Inpatient Remains inpatient appropriate because: IV antibiotics, anticipate likely need of SNF placement, TOC consulted   Consultants:  None  Procedures:  None  Antimicrobials:  Azithromycin 2/1>> Ceftriaxone 2/1>>   Subjective: Patient seen examined bedside, resting comfortably.  Lying in bed.  Son present at bedside.  Had some confusion overnight, now resolved.  Continues to oxygenate well on room air, but continues with mild shortness of breath.  Son concerned about being safe for return home.  Continues on IV antibiotics.  No  other questions or concerns at this time.  Denies headache, no visual changes, no chest pain, no palpitations, no abdominal pain, no fever/chills/night sweats, no nausea/vomiting/diarrhea, no focal weakness, no paresthesias.  No acute events overnight per nursing staff.  Objective: Vitals:   05/04/22 1636 05/04/22 2204 05/05/22 0554 05/05/22 1015  BP: (!) 142/80 (!) 150/87 (!) 183/90 (!) 160/76  Pulse: (!) 103 65 67 63  Resp: '18 17 15 20  '$ Temp: 97.8 F (36.6 C) 98.7 F (37.1 C) (!) 97.3 F (36.3 C) 98.3 F (36.8 C)  TempSrc: Oral Oral Oral Oral  SpO2: 97% 92% 97% 93%  Weight:      Height:        Intake/Output Summary (Last 24 hours) at 05/05/2022 1114 Last data filed at 05/05/2022 0554 Gross per 24 hour  Intake 1427.85 ml  Output 1375 ml  Net 52.85 ml   Filed Weights   05/03/22 1234 05/03/22 2059  Weight: 77.1 kg 67 kg    Examination:  Physical Exam: GEN: NAD, alert and oriented x 3 to Place [facility]), Time (2024), and person, thin in appearance, slight resting tremor noted HEENT: NCAT, PERRL, EOMI, sclera clear, MMM PULM: Breath sounds slight diminished bilateral bases, coarse breath sounds right midlung to base, normal [place]paire effort without accessory muscle use, no wheezing, on room air with SpO2 94% at rest. CV: RRR w/o M/G/R GI: abd soft, NTND, NABS, no R/G/M MSK: no peripheral edema, moves extremities independently NEURO: Slight resting tremor noted from underlying Parkinson disease, otherwise no focal deficits appreciated PSYCH: Depressed mood, flat affect Integumentary: dry/intact, no rashes or wounds    Data Reviewed: I have personally reviewed following labs and imaging studies  CBC: Recent Labs  Lab 05/03/22 1307 05/04/22 0649 05/05/22 0440  WBC 8.2 6.1 5.1  HGB 11.4* 10.4* 10.6*  HCT 34.2* 32.0* 31.3*  MCV 94.2 95.2 94.0  PLT 155 152 466   Basic Metabolic Panel: Recent Labs  Lab 05/03/22 1307 05/04/22 0649 05/05/22 0440  NA 134* 138 140  K 4.1  3.9 3.5  CL 100 104 107  CO2 '28 26 25  '$ GLUCOSE 99 92 95  BUN 29* 20 14  CREATININE 1.32* 1.17 1.06  CALCIUM 8.6* 8.3* 8.2*  MG  --   --  2.0   GFR: Estimated Creatinine Clearance: 46.5 mL/min (by C-G formula based on SCr of 1.06 mg/dL). Liver Function Tests: Recent Labs  Lab 05/03/22 1307  AST 26  ALT <5  ALKPHOS 51  BILITOT 0.9  PROT 7.0  ALBUMIN 3.5   No results for input(s): "LIPASE", "AMYLASE" in the last 168 hours. No results for input(s): "AMMONIA" in the last 168 hours. Coagulation Profile: No results for input(s): "INR", "PROTIME" in the last 168 hours. Cardiac Enzymes: No results for input(s): "CKTOTAL", "CKMB", "CKMBINDEX", "TROPONINI" in the last 168 hours. BNP (last 3 results) No results for input(s): "PROBNP" in the last 8760 hours. HbA1C: No results for input(s): "HGBA1C" in the last 72 hours. CBG: Recent Labs  Lab 05/03/22 1339  GLUCAP 75   Lipid Profile: No results for input(s): "CHOL", "HDL", "LDLCALC", "TRIG", "CHOLHDL", "LDLDIRECT" in the last 72 hours. Thyroid Function Tests: No results for input(s): "TSH", "T4TOTAL", "FREET4", "T3FREE", "THYROIDAB" in the last 72 hours. Anemia Panel: No results for input(s): "VITAMINB12", "FOLATE", "FERRITIN", "TIBC", "IRON", "RETICCTPCT" in the last 72 hours. Sepsis Labs: Recent Labs  Lab 05/03/22 1307  LATICACIDVEN 1.1    Recent Results (from the  past 240 hour(s))  Resp panel by RT-PCR (RSV, Flu A&B, Covid) Anterior Nasal Swab     Status: None   Collection Time: 05/03/22  2:15 PM   Specimen: Anterior Nasal Swab  Result Value Ref Range Status   SARS Coronavirus 2 by RT PCR NEGATIVE NEGATIVE Final    Comment: (NOTE) SARS-CoV-2 target nucleic acids are NOT DETECTED.  The SARS-CoV-2 RNA is generally detectable in upper respiratory specimens during the acute phase of infection. The lowest concentration of SARS-CoV-2 viral copies this assay can detect is 138 copies/mL. A negative result does not preclude  SARS-Cov-2 infection and should not be used as the sole basis for treatment or other patient management decisions. A negative result may occur with  improper specimen collection/handling, submission of specimen other than nasopharyngeal swab, presence of viral mutation(s) within the areas targeted by this assay, and inadequate number of viral copies(<138 copies/mL). A negative result must be combined with clinical observations, patient history, and epidemiological information. The expected result is Negative.  Fact Sheet for Patients:  EntrepreneurPulse.com.au  Fact Sheet for Healthcare Providers:  IncredibleEmployment.be  This test is no t yet approved or cleared by the [place] FDA and  has been authorized for detection and/or diagnosis of SARS-CoV-2 by FDA under an Emergency Use Authorization (EUA). This EUA will remain  in effect (meaning this test can be used) for the duration of the COVID-19 declaration under Section 564(b)(1) of the Act, 21 U.S.C.section 360bbb-3(b)(1), unless the authorization is terminated  or revoked sooner.       Influenza A by PCR NEGATIVE NEGATIVE Final   Influenza B by PCR NEGATIVE NEGATIVE Final    Comment: (NOTE) The Xpert Xpress SARS-CoV-2/FLU/RSV plus assay is intended as an aid in the diagnosis of influenza from Nasopharyngeal swab specimens and should not be used as a sole basis for treatment. Nasal washings and aspirates are unacceptable for Xpert Xpress SARS-CoV-2/FLU/RSV testing.  Fact Sheet for Patients: EntrepreneurPulse.com.au  Fact Sheet for Healthcare Providers: IncredibleEmployment.be  This test is not yet approved or cleared by the [place] FDA and has been authorized for detection and/or diagnosis of SARS-CoV-2 by FDA under an Emergency Use Authorization (EUA). This EUA will remain in effect (meaning this test can be used) for the duration of  the COVID-19 declaration under Section 564(b)(1) of the Act, 21 U.S.C. section 360bbb-3(b)(1), unless the authorization is terminated or revoked.     Resp Syncytial Virus by PCR NEGATIVE NEGATIVE Final    Comment: (NOTE) Fact Sheet for Patients: EntrepreneurPulse.com.au  Fact Sheet for Healthcare Providers: IncredibleEmployment.be  This test is not yet approved or cleared by the [place] FDA and has been authorized for detection and/or diagnosis of SARS-CoV-2 by FDA under an Emergency Use Authorization (EUA). This EUA will remain in effect (meaning this test can be used) for the duration of the COVID-19 declaration under Section 564(b)(1) of the Act, 21 U.S.C. section 360bbb-3(b)(1), unless the authorization is terminated or revoked.  Performed at [facility], [address], [place], [place] [address]          Radiology Studies: DG Swallowing [facility] Pathology  Result Date: 05/04/2022 Table formatting from the original result was not included. Objective Swallowing Evaluation: Type of Study: MBS-Modified Barium Swallow Study  Patient Details Name: DAMARI SUASTEGUI MRN: [id number] Date of Birth: 1932/12/21 Today's Date: 05/04/2022 Time: SLP Start Time (ACUTE ONLY): 1310 -SLP Stop Time (ACUTE ONLY): 1331 SLP Time Calculation (min) (ACUTE ONLY): 21 min Past  Medical History: Past Medical History: Diagnosis Date  Anemia 1952  post Oromycin for [place]mia  Anisocoria   post op  Arthritis   Autonomic dysfunction   GERD (gastroesophageal reflux disease)   Glaucoma   Dr Bing Plume  Hearing loss in right ear   Hypercholesteremia   Hyperthyroidism   s/p RAI  LVH (left ventricular hypertrophy)   Orthostatic hypotension   Parkinson's disease  Past Surgical History: Past Surgical History: Procedure Laterality Date  CARDIAC CATHETERIZATION  07/2006  Dr.  Eustace Quail, negative  CATARACT EXTRACTION, BILATERAL    OS retinal surgery for post op floaters   COLONOSCOPY    negative X 3  RAI ablation  02/2008  hyperthyroidism  SEPTOPLASTY  1970  VASECTOMY   HPI: 89 y.o. male adm 2/1 with cough, congestion, cough productive of mucus. Increased confusion, urinary incontinence, hallucinations over past couple of days. CT showed R middle lobe consolidation and numerous solid nodules and patchy consolidations which are most pronounced in the bilateral lower lobes and R middle lobes. Findings likely due to aspiration or infection. Pt had an OP MBS in 2019 that showed mild oral and moderate pharyngeal dysphagia that included "impaired pharyngeal contraction, tongue base retraction and laryngeal elevation results in residuals across all consistencies." He did not aspirate or frankly penetrate but various postures did not reduce residue. He was recommended to have OP SLP but pt says he did not pursue this. PMH: Parkinson's disease, GERD, hearing loss R ear, glaucoma, hyperthyroidism  Subjective: denies trouble swallowing  Recommendations for follow up therapy are one component of a multi-disciplinary discharge planning process, led by the attending physician.  Recommendations may be updated based on patient status, additional functional criteria and insurance authorization. Assessment / Plan / Recommendation   05/04/2022   2:00 PM Clinical Impressions Clinical Impression Pt has what appears to be a mild progression of his dysphagia since last MBS in 2019, still with pharyngeal residuals across all consistencies but not aspirating on thin liquid residue. Premature spillage is noted with thin liquids, but overall there is some limited visibility of the oral phase throughout testing. Pharyngeally there is reduced base of tongue retraction, pharyngeal squeeze, and epiglottic inversion. Residue is noted througout the valleculae and lateral channels down through the pyriform sinuses. When he opens his airway back up after swallowing or in between boluses, thin liquid residue spills into  his open aiway and results in silent aspiration. Despite increasing residuals, there is no other airway invasion with thicker consistencies. He performs multiple swallow to gradually reduce residue present. Recommend starting with Dys 2 diet and nectar thick liquids. Will f/u for trial of dysphagia therapy given suspicion for acute on chronic deficits. SLP Visit Diagnosis Dysphagia, pharyngeal phase (R13.13) Impact on safety and function Moderate aspiration risk;Risk for inadequate nutrition/hydration     05/04/2022   2:00 PM Treatment Recommendations Treatment Recommendations Therapy as outlined in treatment plan below     05/04/2022   2:00 PM Prognosis Prognosis for Safe Diet Advancement Good Barriers to Reach Goals Cognitive deficits;Time post onset   05/04/2022   2:00 PM Diet Recommendations SLP Diet Recommendations Dysphagia 2 (Fine chop) solids;Nectar thick liquid Liquid Administration via Cup;Straw Medication Administration Crushed with puree Compensations Minimize environmental distractions;Slow rate;Small sips/bites Postural Changes Seated upright at 90 degrees;Remain semi-upright after after feeds/meals (Comment)     05/04/2022   2:00 PM Other Recommendations Oral Care Recommendations Oral care BID Other Recommendations Order thickener from pharmacy;Prohibited food (jello, ice cream, thin soups);Remove water pitcher  Follow Up Recommendations Skilled nursing-short term rehab (<3 hours/day) Functional Status Assessment Patient has had a recent decline in their functional status and demonstrates the ability to make significant improvements in function in a reasonable and predictable amount of time.   05/04/2022   2:00 PM Frequency and Duration  Speech Therapy Frequency (ACUTE ONLY) min 2x/week Treatment Duration 2 weeks     05/04/2022   2:00 PM Oral Phase Oral Phase Impaired Oral - Thin Teaspoon Premature spillage Oral - Thin Cup Premature spillage    05/04/2022   2:00 PM Pharyngeal Phase Pharyngeal Phase Impaired  Pharyngeal- Honey Teaspoon Reduced epiglottic inversion;Reduced tongue base retraction;Reduced pharyngeal peristalsis;Pharyngeal residue - valleculae;Pharyngeal residue - pyriform;Lateral channel residue Pharyngeal- Nectar Teaspoon Reduced epiglottic inversion;Reduced tongue base retraction;Reduced pharyngeal peristalsis;Pharyngeal residue - valleculae;Pharyngeal residue - pyriform;Lateral channel residue Pharyngeal- Nectar Cup Reduced epiglottic inversion;Reduced tongue base retraction;Reduced pharyngeal peristalsis;Pharyngeal residue - valleculae;Pharyngeal residue - pyriform;Lateral channel residue Pharyngeal- Thin Teaspoon Reduced epiglottic inversion;Reduced tongue base retraction;Reduced pharyngeal peristalsis;Pharyngeal residue - valleculae;Pharyngeal residue - pyriform;Lateral channel residue Pharyngeal- Thin Cup Reduced epiglottic inversion;Reduced tongue base retraction;Reduced pharyngeal peristalsis;Pharyngeal residue - valleculae;Pharyngeal residue - pyriform;Lateral channel residue;Penetration/Apiration after swallow Pharyngeal Material enters airway, passes BELOW cords without attempt by patient to eject out (silent aspiration) Pharyngeal- Puree Reduced epiglottic inversion;Reduced tongue base retraction;Reduced pharyngeal peristalsis;Pharyngeal residue - valleculae;Pharyngeal residue - pyriform;Lateral channel residue Pharyngeal- Regular Reduced epiglottic inversion;Reduced tongue base retraction;Reduced pharyngeal peristalsis;Pharyngeal residue - valleculae;Pharyngeal residue - pyriform;Lateral channel residue    03/05/2018   3:00 PM Cervical Esophageal Phase  Cervical Esophageal Phase Impaired Cervical Esophageal Comment decreased clearance into esophagus resulting in pyriform sinus residuals Osie Bond., M.A. CCC-SLP Acute Rehabilitation Services Office [phone] Secure chat preferred 05/04/2022, 2:32 PM                     CT Chest W Contrast  Result Date: 05/03/2022 CLINICAL DATA:  Pneumonia  suspected EXAM: CT CHEST WITH CONTRAST TECHNIQUE: Multidetector CT imaging of the chest was performed during intravenous contrast administration. RADIATION DOSE REDUCTION: This exam was performed according to the departmental dose-optimization program which includes automated exposure control, adjustment of the mA and/or kV according to patient size and/or use of iterative reconstruction technique. CONTRAST:  58m OMNIPAQUE IOHEXOL 300 MG/ML  SOLN COMPARISON:  Chest CT dated Aug 16, 2006 FINDINGS: Cardiovascular: Normal heart size. No pericardial effusion. Normal caliber thoracic aorta with moderate atherosclerotic disease. Severe coronary artery calcifications. Mediastinum/Nodes: Patulous esophagus. Thyroid is unremarkable. Mildly enlarged AP window lymph node measuring 10 mm in short axis on series 7, image 72, likely reactive. No pathologically enlarged lymph nodes seen in the chest Lungs/Pleura: Central airways are patent. Right middle lobe consolidation and numerous solid nodules and patchy consolidations which are most pronounced pronounced in the bilateral lower lobe and right middle lobe. No pleural effusion pneumothorax. Upper Abdomen: No acute abnormality. Musculoskeletal: No chest wall abnormality. No acute or significant osseous findings. IMPRESSION: 1. Right middle lobe consolidation and numerous solid nodules and patchy consolidations which are most pronounced in the bilateral lower lobes and right middle lobe, findings are likely due to aspiration or infection. Recommend follow-up chest CT in 3 months to ensure resolution. 2. Mildly enlarged AP window lymph node which is likely reactive. Recommend attention on follow-up 3. Aortic Atherosclerosis (ICD10-I70.0). Electronically Signed   By: LYetta GlassmanM.D.   On: 05/03/2022 16:31   DG Chest 2 View  Result Date: 05/03/2022 CLINICAL DATA:  Cough, altered mental status. EXAM: CHEST - 2 VIEW  COMPARISON:  None Available. FINDINGS: Well-expanded lungs.  Patchy opacities in the lower lobes seen only on the lateral view may represent atelectasis, aspiration or scarring. Normal heart size. Atherosclerotic calcifications of the aortic arch and tortuosity of the descending thoracic aorta. No pleural effusion or pneumothorax. Visualized bones and upper abdomen are unremarkable. IMPRESSION: Patchy opacities in the lower lobes seen only on the lateral view may represent atelectasis, scarring or aspiration. Electronically Signed   By: Emmit Alexanders M.D.   On: 05/03/2022 14:49   CT Head Wo Contrast  Result Date: 05/03/2022 CLINICAL DATA:  Mental status change, unknown cause EXAM: CT HEAD WITHOUT CONTRAST TECHNIQUE: Contiguous axial images were obtained from the base of the skull through the vertex without intravenous contrast. RADIATION DOSE REDUCTION: This exam was performed according to the departmental dose-optimization program which includes automated exposure control, adjustment of the mA and/or kV according to patient size and/or use of iterative reconstruction technique. COMPARISON:  None Available. FINDINGS: Brain: There is periventricular white matter decreased attenuation consistent with small vessel ischemic changes. Ventricles, sulci and cisterns are prominent consistent with age related involutional changes. No acute intracranial hemorrhage, mass effect or shift. No hydrocephalus. Vascular: No hyperdense vessel or unexpected calcification. Skull: Normal. Negative for fracture or focal lesion. Sinuses/Orbits: Mucoperiosteal thickening consistent with chronic maxillary, ethmoid and frontal sinusitis. No acute finding. IMPRESSION: Atrophy and chronic small vessel ischemic changes. No acute intracranial process identified. Electronically Signed   By: Sammie Bench M.D.   On: 05/03/2022 14:08        Scheduled Meds:  carbidopa-levodopa  1 tablet Oral QHS   carbidopa-levodopa  1 tablet Oral Daily   carbidopa-levodopa  2 tablet Oral TID   enoxaparin  (LOVENOX) injection  40 mg Subcutaneous Q24H   guaiFENesin  600 mg Oral BID   levothyroxine  100 mcg Oral Q0600   melatonin  3 mg Oral QHS   mirtazapine  7.5 mg Oral QHS   QUEtiapine  12.5 mg Oral BID   simvastatin  20 mg Oral QPM   Continuous Infusions:  sodium chloride Stopped (05/03/22 1944)   azithromycin 500 mg (05/04/22 1820)   cefTRIAXone (ROCEPHIN)  IV 2 g (05/05/22 0920)   lactated ringers Stopped (05/04/22 1820)     LOS: 1 day    Time spent: 52 minutes spent on chart review, discussion with nursing staff, consultants, updating family and interview/physical exam; more than 50% of that time was spent in counseling and/or coordination of care.    Zabria Liss J [place] DO [facility] Available via Epic secure chat 7am-7pm After these hours, please refer to coverage provider listed on [email or web address] 05/05/2022, 11:14 AM

## 2022-05-06 MED ORDER — AMLODIPINE BESYLATE 5 MG PO TABS
5.0000 mg | ORAL_TABLET | Freq: Every day | ORAL | Status: DC
  Administered 2022-05-06 – 2022-05-07 (×2): 5 mg via ORAL
  Filled 2022-05-06 (×2): qty 1

## 2022-05-06 MED ORDER — QUETIAPINE FUMARATE 25 MG PO TABS: 12.5000 mg | ORAL_TABLET | Freq: Every day | ORAL | Status: DC

## 2022-05-06 MED ORDER — AZITHROMYCIN 500 MG PO TABS
500.0000 mg | ORAL_TABLET | ORAL | Status: AC
  Administered 2022-05-07 – 2022-05-08 (×2): 500 mg via ORAL
  Filled 2022-05-06 (×2): qty 1

## 2022-05-06 NOTE — Progress Notes (Signed)
PROGRESS NOTE    William Williamson  NWG:[id number] DOB: 1932-04-21 DOA: 05/03/2022 PCP: [name], DO    Brief Narrative:   William Williamson is a 89 y.o. male [employer] veteran with past medical history significant for Parkinson's disease/dementia, hypothyroidism, hyperlipidemia who presented to [facility] ED on 2/1 with increased confusion, hallucinations over the past 5 days.  Patient also during this timeframe has had a productive cough, congestion.  Per son, no overt aspiration episodes noted and no history of aspiration previously.  In the ED, temperature 97.7 F, HR 62, RR 18, BP 161/79, SpO2 96% on room air.  Sodium 134, potassium 4.1, chloride 100, CO2 28, glucose 99, BUN 29, creatinine 1.32.  WBC 8.2, hemoglobin 11.4, platelets 155.  Lactic acid 1.1.  Urinalysis unrevealing.  UDS negative.  EtOH level less than 10.  COVID/RSV/influenza PCR negative.  CT head without contrast with atrophy and chronic small vessel ischemic changes, no acute intracranial process identified.  CT chest without contrast with right middle lobe consolidation numerous solid nodules and patchy consolidations more pronounced in the bilateral lower lobes and right middle lobe likely due to aspiration versus infection, aortic atherosclerosis.  Assessment & Plan:   Acute metabolic encephalopathy Patient presenting to ED with increased confusion, hallucinations over the past several days.  UDS negative, EtOH level less than 10. Recent vitamin D and vitamin B12 level on 02/01/2022 within normal limits.  Was recently seen by his neurologist, Dr. Carles Collet on 1/25 with increase of Seroquel to 12.5 mg p.o. twice daily.  Concern for likely infectious etiology given CT chest concerning for pneumonia.  CT head with no acute findings.  COVID/RSV/influenza PCR negative.  Complicated by his history of underlying Parkinson's dementia. -- Continue treatment as below, supportive care  Community-acquired  pneumonia Patient presenting with confusion, productive cough.  CT chest without contrast with right middle lobe consolidation with numerous solid nodules and patchy consolidations concerning for aspiration versus infection. -- Azithromycin 500 mg IV q24h -- Ceftriaxone 2 g IV q24h -- Mucinex 600 mg PO BID -- Incentive spirometry/flutter valve -- Supportive care  Parkinson's disease/dementia Follows with neurology outpatient, Dr. Carles Collet; last seen in clinic on 1/25. -- Carbidopa/levodopa 2 tablets 7am/11am/3pm and 1 tablet at 7pm -- Carbidopa/levodopa 50/200 CR at bedtime -- Seroquel 12.5 mg p.o. twice daily -- Melatonin 3 mg p.o. nightly -- Supportive care, fall precautions  Dysphagia Evaluated by speech therapy. Dysphagia 2 (Fine chop) solids;Nectar thick liquid Liquid Administration via: Cup;Straw Medication Administration: Crushed with puree Supervision: Intermittent supervision to cue for compensatory strategies;Comment (set-up assistance) Compensations: Minimize environmental distractions;Slow rate;Small sips/bites Postural Changes: Seated upright at 90 degrees;Remain semi-upright after after feeds/meals (Comment) Oral Care Recommendations: Oral care BID Other Recommendations: Order thickener from pharmacy;Prohibited food (jello, ice cream, thin soups);Remove water pitcher  Hypothyroidism TSH 1.78 on 02/01/2022. -- Levothyroxine 100 mcg p.o. daily  Hyperlipidemia -- Simvastatin 20 mg p.o. daily  Weakness/debility/gait disturbance: Per son, William Williamson; patient is currently [facility] for [facility]. -- PT evaluation: Recommends SNF placement -- TOC consult   DVT prophylaxis: enoxaparin (LOVENOX) injection 40 mg Start: 05/04/22 2000    Code Status: Full Code Family Communication: No family present at bedside this morning, updated patient's son William Williamson at bedside yesterday afternoon  Disposition Plan:  Level of care: Med-Surg .Status is:  Inpatient Remains inpatient appropriate because: IV antibiotics, SNF placement  Consultants:  None  Procedures:  None  Antimicrobials:  Azithromycin 2/1>> Ceftriaxone 2/1>>  Subjective: Patient seen examined bedside, resting comfortably.  Lying in bed.  No family present.  No specific complaints this morning.  Continues on IV antibiotics.  Denies headache, no visual changes, no chest pain, no palpitations, no abdominal pain, no fever/chills/night sweats, no nausea/vomiting/diarrhea, no focal weakness, no paresthesias.  No acute events overnight per nursing staff.  Pending SNF placement.  Objective: Vitals:   05/05/22 1658 05/05/22 2037 05/06/22 0546 05/06/22 0829  BP: (!) 183/88 (!) 183/86 (!) 192/90 (!) 169/89  Pulse: 67 77 66 70  Resp: '17  20 17  '$ Temp: 98.7 F (37.1 C)  (!) 97.4 F (36.3 C) 98.4 F (36.9 C)  TempSrc:   Oral Oral  SpO2: 97%  95% 94%  Weight:   66.6 kg   Height:        Intake/Output Summary (Last 24 hours) at 05/06/2022 1027 Last data filed at 05/06/2022 0800 Gross per 24 hour  Intake 400.06 ml  Output 1900 ml  Net -1499.94 ml   Filed Weights   05/03/22 1234 05/03/22 2059 05/06/22 0546  Weight: 77.1 kg 67 kg 66.6 kg    Examination:  Physical Exam: GEN: NAD, alert and oriented x 3 to Place [facility]), Time (2024), and person, thin in appearance, slight resting tremor noted HEENT: NCAT, PERRL, EOMI, sclera clear, MMM PULM: Breath sounds slight diminished bilateral bases, coarse breath sounds right midlung to base, normal [place]paire effort without accessory muscle use, no wheezing, on room air with SpO2 94% at rest. CV: RRR w/o M/G/R GI: abd soft, NTND, NABS, no R/G/M MSK: no peripheral edema, moves extremities independently NEURO: Slight resting tremor noted from underlying Parkinson disease, otherwise no focal deficits appreciated PSYCH: Depressed mood, flat affect Integumentary: dry/intact, no rashes or wounds    Data Reviewed: I have personally  reviewed following labs and imaging studies  CBC: Recent Labs  Lab 05/03/22 1307 05/04/22 0649 05/05/22 0440  WBC 8.2 6.1 5.1  HGB 11.4* 10.4* 10.6*  HCT 34.2* 32.0* 31.3*  MCV 94.2 95.2 94.0  PLT 155 152 149   Basic Metabolic Panel: Recent Labs  Lab 05/03/22 1307 05/04/22 0649 05/05/22 0440  NA 134* 138 140  K 4.1 3.9 3.5  CL 100 104 107  CO2 '28 26 25  '$ GLUCOSE 99 92 95  BUN 29* 20 14  CREATININE 1.32* 1.17 1.06  CALCIUM 8.6* 8.3* 8.2*  MG  --   --  2.0   GFR: Estimated Creatinine Clearance: 46.3 mL/min (by C-G formula based on SCr of 1.06 mg/dL). Liver Function Tests: Recent Labs  Lab 05/03/22 1307  AST 26  ALT <5  ALKPHOS 51  BILITOT 0.9  PROT 7.0  ALBUMIN 3.5   No results for input(s): "LIPASE", "AMYLASE" in the last 168 hours. No results for input(s): "AMMONIA" in the last 168 hours. Coagulation Profile: No results for input(s): "INR", "PROTIME" in the last 168 hours. Cardiac Enzymes: No results for input(s): "CKTOTAL", "CKMB", "CKMBINDEX", "TROPONINI" in the last 168 hours. BNP (last 3 results) No results for input(s): "PROBNP" in the last 8760 hours. HbA1C: No results for input(s): "HGBA1C" in the last 72 hours. CBG: Recent Labs  Lab 05/03/22 1339  GLUCAP 75   Lipid Profile: No results for input(s): "CHOL", "HDL", "LDLCALC", "TRIG", "CHOLHDL", "LDLDIRECT" in the last 72 hours. Thyroid Function Tests: No results for input(s): "TSH", "T4TOTAL", "FREET4", "T3FREE", "THYROIDAB" in the last 72 hours. Anemia Panel: No results for input(s): "VITAMINB12", "FOLATE", "FERRITIN", "TIBC", "IRON", "RETICCTPCT" in the last 72 hours. Sepsis  Labs: Recent Labs  Lab 05/03/22 1307  LATICACIDVEN 1.1    Recent Results (from the past 240 hour(s))  Resp panel by RT-PCR (RSV, Flu A&B, Covid) Anterior Nasal Swab     Status: None   Collection Time: 05/03/22  2:15 PM   Specimen: Anterior Nasal Swab  Result Value Ref Range Status   SARS Coronavirus 2 by RT PCR  NEGATIVE NEGATIVE Final    Comment: (NOTE) SARS-CoV-2 target nucleic acids are NOT DETECTED.  The SARS-CoV-2 RNA is generally detectable in upper respiratory specimens during the acute phase of infection. The lowest concentration of SARS-CoV-2 viral copies this assay can detect is 138 copies/mL. A negative result does not preclude SARS-Cov-2 infection and should not be used as the sole basis for treatment or other patient management decisions. A negative result may occur with  improper specimen collection/handling, submission of specimen other than nasopharyngeal swab, presence of viral mutation(s) within the areas targeted by this assay, and inadequate number of viral copies(<138 copies/mL). A negative result must be combined with clinical observations, patient history, and epidemiological information. The expected result is Negative.  Fact Sheet for Patients:  EntrepreneurPulse.com.au  Fact Sheet for Healthcare Providers:  IncredibleEmployment.be  This test is no t yet approved or cleared by the [place] FDA and  has been authorized for detection and/or diagnosis of SARS-CoV-2 by FDA under an Emergency Use Authorization (EUA). This EUA will remain  in effect (meaning this test can be used) for the duration of the COVID-19 declaration under Section 564(b)(1) of the Act, 21 U.S.C.section 360bbb-3(b)(1), unless the authorization is terminated  or revoked sooner.       Influenza A by PCR NEGATIVE NEGATIVE Final   Influenza B by PCR NEGATIVE NEGATIVE Final    Comment: (NOTE) The Xpert Xpress SARS-CoV-2/FLU/RSV plus assay is intended as an aid in the diagnosis of influenza from Nasopharyngeal swab specimens and should not be used as a sole basis for treatment. Nasal washings and aspirates are unacceptable for Xpert Xpress SARS-CoV-2/FLU/RSV testing.  Fact Sheet for Patients: EntrepreneurPulse.com.au  Fact Sheet for  Healthcare Providers: IncredibleEmployment.be  This test is not yet approved or cleared by the [place] FDA and has been authorized for detection and/or diagnosis of SARS-CoV-2 by FDA under an Emergency Use Authorization (EUA). This EUA will remain in effect (meaning this test can be used) for the duration of the COVID-19 declaration under Section 564(b)(1) of the Act, 21 U.S.C. section 360bbb-3(b)(1), unless the authorization is terminated or revoked.     Resp Syncytial Virus by PCR NEGATIVE NEGATIVE Final    Comment: (NOTE) Fact Sheet for Patients: EntrepreneurPulse.com.au  Fact Sheet for Healthcare Providers: IncredibleEmployment.be  This test is not yet approved or cleared by the [place] FDA and has been authorized for detection and/or diagnosis of SARS-CoV-2 by FDA under an Emergency Use Authorization (EUA). This EUA will remain in effect (meaning this test can be used) for the duration of the COVID-19 declaration under Section 564(b)(1) of the Act, 21 U.S.C. section 360bbb-3(b)(1), unless the authorization is terminated or revoked.  Performed at [facility], [address] [place], [place] [address]          Radiology Studies: DG Swallowing [facility]  Result Date: 05/04/2022 Table formatting from the original result was not included. Objective Swallowing Evaluation: Type of Study: MBS-Modified Barium Swallow Study  Patient Details Name: REIS GOGA MRN: [id number] Date of Birth: 03-Jun-1932 Today's Date: 05/04/2022 Time: SLP Start Time (ACUTE  ONLY): 1310 -SLP Stop Time (ACUTE ONLY): 1331 SLP Time Calculation (min) (ACUTE ONLY): 21 min Past Medical History: Past Medical History: Diagnosis Date  Anemia 1952  post Oromycin for [place]mia  Anisocoria   post op  Arthritis   Autonomic dysfunction   GERD (gastroesophageal reflux disease)   Glaucoma   Dr Bing Plume  Hearing loss in right ear    Hypercholesteremia   Hyperthyroidism   s/p RAI  LVH (left ventricular hypertrophy)   Orthostatic hypotension   Parkinson's disease  Past Surgical History: Past Surgical History: Procedure Laterality Date  CARDIAC CATHETERIZATION  07/2006  Dr.  Eustace Quail, negative  CATARACT EXTRACTION, BILATERAL    OS retinal surgery for post op floaters  COLONOSCOPY    negative X 3  RAI ablation  02/2008  hyperthyroidism  SEPTOPLASTY  1970  VASECTOMY   HPI: 89 y.o. male adm 2/1 with cough, congestion, cough productive of mucus. Increased confusion, urinary incontinence, hallucinations over past couple of days. CT showed R middle lobe consolidation and numerous solid nodules and patchy consolidations which are most pronounced in the bilateral lower lobes and R middle lobes. Findings likely due to aspiration or infection. Pt had an OP MBS in 2019 that showed mild oral and moderate pharyngeal dysphagia that included "impaired pharyngeal contraction, tongue base retraction and laryngeal elevation results in residuals across all consistencies." He did not aspirate or frankly penetrate but various postures did not reduce residue. He was recommended to have OP SLP but pt says he did not pursue this. PMH: Parkinson's disease, GERD, hearing loss R ear, glaucoma, hyperthyroidism  Subjective: denies trouble swallowing  Recommendations for follow up therapy are one component of a multi-disciplinary discharge planning process, led by the attending physician.  Recommendations may be updated based on patient status, additional functional criteria and insurance authorization. Assessment / Plan / Recommendation   05/04/2022   2:00 PM Clinical Impressions Clinical Impression Pt has what appears to be a mild progression of his dysphagia since last MBS in 2019, still with pharyngeal residuals across all consistencies but not aspirating on thin liquid residue. Premature spillage is noted with thin liquids, but overall there is some limited visibility of  the oral phase throughout testing. Pharyngeally there is reduced base of tongue retraction, pharyngeal squeeze, and epiglottic inversion. Residue is noted througout the valleculae and lateral channels down through the pyriform sinuses. When he opens his airway back up after swallowing or in between boluses, thin liquid residue spills into his open aiway and results in silent aspiration. Despite increasing residuals, there is no other airway invasion with thicker consistencies. He performs multiple swallow to gradually reduce residue present. Recommend starting with Dys 2 diet and nectar thick liquids. Will f/u for trial of dysphagia therapy given suspicion for acute on chronic deficits. SLP Visit Diagnosis Dysphagia, pharyngeal phase (R13.13) Impact on safety and function Moderate aspiration risk;Risk for inadequate nutrition/hydration     05/04/2022   2:00 PM Treatment Recommendations Treatment Recommendations Therapy as outlined in treatment plan below     05/04/2022   2:00 PM Prognosis Prognosis for Safe Diet Advancement Good Barriers to Reach Goals Cognitive deficits;Time post onset   05/04/2022   2:00 PM Diet Recommendations SLP Diet Recommendations Dysphagia 2 (Fine chop) solids;Nectar thick liquid Liquid Administration via Cup;Straw Medication Administration Crushed with puree Compensations Minimize environmental distractions;Slow rate;Small sips/bites Postural Changes Seated upright at 90 degrees;Remain semi-upright after after feeds/meals (Comment)     05/04/2022   2:00 PM Other Recommendations Oral Care Recommendations  Oral care BID Other Recommendations Order thickener from pharmacy;Prohibited food (jello, ice cream, thin soups);Remove water pitcher Follow Up Recommendations Skilled nursing-short term rehab (<3 hours/day) Functional Status Assessment Patient has had a recent decline in their functional status and demonstrates the ability to make significant improvements in function in a reasonable and predictable  amount of time.   05/04/2022   2:00 PM Frequency and Duration  Speech Therapy Frequency (ACUTE ONLY) min 2x/week Treatment Duration 2 weeks     05/04/2022   2:00 PM Oral Phase Oral Phase Impaired Oral - Thin Teaspoon Premature spillage Oral - Thin Cup Premature spillage    05/04/2022   2:00 PM Pharyngeal Phase Pharyngeal Phase Impaired Pharyngeal- Honey Teaspoon Reduced epiglottic inversion;Reduced tongue base retraction;Reduced pharyngeal peristalsis;Pharyngeal residue - valleculae;Pharyngeal residue - pyriform;Lateral channel residue Pharyngeal- Nectar Teaspoon Reduced epiglottic inversion;Reduced tongue base retraction;Reduced pharyngeal peristalsis;Pharyngeal residue - valleculae;Pharyngeal residue - pyriform;Lateral channel residue Pharyngeal- Nectar Cup Reduced epiglottic inversion;Reduced tongue base retraction;Reduced pharyngeal peristalsis;Pharyngeal residue - valleculae;Pharyngeal residue - pyriform;Lateral channel residue Pharyngeal- Thin Teaspoon Reduced epiglottic inversion;Reduced tongue base retraction;Reduced pharyngeal peristalsis;Pharyngeal residue - valleculae;Pharyngeal residue - pyriform;Lateral channel residue Pharyngeal- Thin Cup Reduced epiglottic inversion;Reduced tongue base retraction;Reduced pharyngeal peristalsis;Pharyngeal residue - valleculae;Pharyngeal residue - pyriform;Lateral channel residue;Penetration/Apiration after swallow Pharyngeal Material enters airway, passes BELOW cords without attempt by patient to eject out (silent aspiration) Pharyngeal- Puree Reduced epiglottic inversion;Reduced tongue base retraction;Reduced pharyngeal peristalsis;Pharyngeal residue - valleculae;Pharyngeal residue - pyriform;Lateral channel residue Pharyngeal- Regular Reduced epiglottic inversion;Reduced tongue base retraction;Reduced pharyngeal peristalsis;Pharyngeal residue - valleculae;Pharyngeal residue - pyriform;Lateral channel residue    03/05/2018   3:00 PM Cervical Esophageal Phase  Cervical  Esophageal Phase Impaired Cervical Esophageal Comment decreased clearance into esophagus resulting in pyriform sinus residuals Osie Bond., M.A. CCC-SLP Acute Rehabilitation Services Office [phone] Secure chat preferred 05/04/2022, 2:32 PM                          Scheduled Meds:  amLODipine  5 mg Oral Daily   [START ON 05/07/2022] azithromycin  500 mg Oral Q24H   carbidopa-levodopa  1 tablet Oral QHS   carbidopa-levodopa  1 tablet Oral Daily   carbidopa-levodopa  2 tablet Oral TID   enoxaparin (LOVENOX) injection  40 mg Subcutaneous Q24H   guaiFENesin  600 mg Oral BID   levothyroxine  100 mcg Oral Q0600   melatonin  3 mg Oral QHS   mirtazapine  7.5 mg Oral QHS   QUEtiapine  12.5 mg Oral BID   simvastatin  20 mg Oral QPM   Continuous Infusions:  sodium chloride Stopped (05/03/22 1944)   cefTRIAXone (ROCEPHIN)  IV 2 g (05/06/22 0825)     LOS: 2 days    Time spent: 52 minutes spent on chart review, discussion with nursing staff, consultants, updating family and interview/physical exam; more than 50% of that time was spent in counseling and/or coordination of care.    Michelle Vanhise J [place] DO [facility] Available via Epic secure chat 7am-7pm After these hours, please refer to coverage provider listed on [email or web address] 05/06/2022, 10:27 AM

## 2022-05-07 LAB — BASIC METABOLIC PANEL
Anion gap: 8 (ref 5–15)
BUN: 10 mg/dL (ref 8–23)
CO2: 27 mmol/L (ref 22–32)
Calcium: 8.9 mg/dL (ref 8.9–10.3)
Chloride: 103 mmol/L (ref 98–111)
Creatinine, Ser: 1.03 mg/dL (ref 0.61–1.24)
GFR, Estimated: >60 mL/min (ref >60)
Glucose, Bld: 98 mg/dL (ref 70–99)
Potassium: 3.8 mmol/L (ref 3.5–5.1)
Sodium: 138 mmol/L (ref 135–145)

## 2022-05-07 LAB — MAGNESIUM: Magnesium: 1.9 mg/dL (ref 1.7–2.4)

## 2022-05-07 LAB — GLUCOSE, CAPILLARY: Glucose-Capillary: 99 mg/dL (ref 70–99)

## 2022-05-07 MED ORDER — SODIUM CHLORIDE 0.9 % IV SOLN
2.0000 g | INTRAVENOUS | Status: DC
  Administered 2022-05-08 – 2022-05-09 (×2): 2 g via INTRAVENOUS
  Filled 2022-05-07 (×3): qty 20

## 2022-05-07 MED ORDER — HALOPERIDOL LACTATE 5 MG/ML IJ SOLN: 1.0000 mg | Freq: Four times a day (QID) | INTRAMUSCULAR | Status: DC | PRN

## 2022-05-07 MED ORDER — QUETIAPINE FUMARATE 25 MG PO TABS
25.0000 mg | ORAL_TABLET | Freq: Every day | ORAL | Status: DC
  Administered 2022-05-07 – 2022-05-10 (×4): 25 mg via ORAL
  Filled 2022-05-07 (×4): qty 1

## 2022-05-07 MED ORDER — HALOPERIDOL 1 MG PO TABS
1.0000 mg | ORAL_TABLET | Freq: Four times a day (QID) | ORAL | Status: DC | PRN
  Administered 2022-05-07 – 2022-05-10 (×2): 1 mg via ORAL
  Filled 2022-05-07 (×4): qty 1

## 2022-05-07 NOTE — Progress Notes (Signed)
Mobility Specialist Progress Note   05/07/22 1227  Mobility  Activity Transferred from chair to bed  Level of Assistance Moderate assist, patient does 50-74%  Assistive Device Front wheel walker  Distance Ambulated (ft) 4 ft  Activity Response Tolerated fair  Mobility Referral Yes  $Mobility charge 1 Mobility   pt requesting assistance to get from chair to bed d/t fatigue. Required ModA to stand and steady pt + mod cues on sequencing throughout. Pt able to stand and pivot but remaining in this flexed trunk position regardless of the cues given. Pt placed in bed with all needs met and w/o fault, call bell in reach and bed alarm on.  [place] Mobility Specialist Please contact via SecureChat or  Rehab office at [phone]

## 2022-05-07 NOTE — [place] FL2 (Signed)
[place] MEDICAID FL2 LEVEL OF CARE FORM     IDENTIFICATION  Patient Name: William Williamson Birthdate: Sep 18, 1932 Sex: male Admission Date (Current Location): 05/03/2022  [facility] and [place] Number:  Herbalist and Address:  [facility] [place], [place] [address] [place], [place] [address]      Provider Number: [id number]  Attending Physician Name and Address:  [place] Eric J, DO  Relative Name and Phone Number:  Dillan, Candela [phone]) [phone]    Current Level of Care: Hospital Recommended Level of Care: [place] Prior Approval Number:    Date Approved/Denied:   PASRR Number: pending  Discharge Plan: SNF    Current Diagnoses: Patient Active Problem List   Diagnosis Date Noted   Acute metabolic encephalopathy 99/83/3825   CAP (community acquired pneumonia) 05/03/2022   Loose stools 03/16/2021   Low back pain radiating to left leg 09/01/2020   Frequent falls 08/12/2020   Depression 05/14/2020   Insomnia 04/12/2020   Hallucinations 04/12/2020   Left elbow pain 04/12/2020   Left wrist pain 04/12/2020   Urinary incontinence 04/12/2020   Benign prostatic hyperplasia with urinary frequency 03/03/2019   Tinea corporis 03/03/2019   Elevated BP without diagnosis of hypertension 12/30/2018   Lower extremity edema 09/09/2018   Otalgia 08/26/2018   Preventative health care 08/16/2016   Hypothyroidism 08/16/2016   Prostate enlargement 08/16/2016   Autonomic dysfunction 02/02/2016   Ingrown toenail 05/17/2015   Right thigh pain 12/24/2014   H. pylori infection 06/21/2014   Primary hypertension 02/08/2014   Hyperlipidemia 02/08/2014   Parkinson disease 12/21/2013   Hearing loss 10/06/2013   Paralysis agitans 07/28/2013   UTI (urinary tract infection) 05/27/2013   Nonspecific abnormal electrocardiogram (ECG) (EKG) 06/14/2011   Anemia, unspecified 06/13/2010   CONSTIPATION, CHRONIC 06/09/2009   Hypothyroidism following  radioiodine therapy 07/07/2008   Vitamin D deficiency 01/09/2008   HYPERCHOLESTEROLEMIA 07/10/2007   Arthropathy 07/10/2007   G E R D 10/11/2006    Orientation RESPIRATION BLADDER Height & Weight     Self  Normal Incontinent Weight: 146 lb 13.2 oz (66.6 kg) Height:  '6\' 1"'$  (185.4 cm)  BEHAVIORAL SYMPTOMS/MOOD NEUROLOGICAL BOWEL NUTRITION STATUS      Continent Diet (see d/c summary)  AMBULATORY STATUS COMMUNICATION OF NEEDS Skin   Extensive Assist Verbally Normal                       Personal Care Assistance Level of Assistance  Bathing, Feeding, Dressing Bathing Assistance: Limited assistance Feeding assistance: Limited assistance Dressing Assistance: Limited assistance     Functional Limitations Info  Speech, Hearing, Sight Sight Info: Adequate Hearing Info: Adequate Speech Info: Adequate    SPECIAL CARE FACTORS FREQUENCY  OT (By licensed OT), PT (By licensed PT)     PT Frequency: 5x/ week OT Frequency: 5x/ week            Contractures Contractures Info: Not present    Additional Factors Info  Allergies, Code Status, Psychotropic Code Status Info: Full Allergies Info: Garlic  Niacin Psychotropic Info: seroquel 25 mg         Current Medications (05/07/2022):  This is the current hospital active medication list Current Facility-Administered Medications  Medication Dose Route Frequency Provider Last Rate Last Admin   0.9 %  sodium chloride infusion   Intravenous PRN Fredia Sorrow, MD   Stopped at 05/03/22 1944   acetaminophen (TYLENOL) tablet 650 mg  650 mg Oral Q6H PRN Alcario Drought,  Toy Care, DO   650 mg at 05/04/22 2300   Or   acetaminophen (TYLENOL) suppository 650 mg  650 mg Rectal Q6H PRN Etta Quill, DO       amLODipine (NORVASC) tablet 5 mg  5 mg Oral Daily [place] Donnamarie Poag, DO   5 mg at 05/07/22 0845   azithromycin (ZITHROMAX) tablet 500 mg  500 mg Oral Q24H [place] Donnamarie Poag, DO       carbidopa-levodopa (SINEMET CR) 50-200 MG per tablet  controlled release 1 tablet  1 tablet Oral QHS Etta Quill, DO   1 tablet at 05/06/22 2009   carbidopa-levodopa (SINEMET IR) 25-100 MG per tablet immediate release 1 tablet  1 tablet Oral Daily Etta Quill, DO   1 tablet at 05/06/22 2008   carbidopa-levodopa (SINEMET IR) 25-100 MG per tablet immediate release 2 tablet  2 tablet Oral TID Etta Quill, DO   2 tablet at 05/07/22 0845   cefTRIAXone (ROCEPHIN) 2 g in sodium chloride 0.9 % 100 mL IVPB  2 g Intravenous Q24H [place] Eric J, DO 200 mL/hr at 05/07/22 0857 2 g at 05/07/22 0857   [START ON 05/08/2022] cefTRIAXone (ROCEPHIN) 2 g in sodium chloride 0.9 % 100 mL IVPB  2 g Intravenous Q24H [place] Donnamarie Poag, DO       enoxaparin (LOVENOX) injection 40 mg  40 mg Subcutaneous Q24H Alcario Drought, Jared M, DO   40 mg at 05/06/22 2010   guaiFENesin (MUCINEX) 12 hr tablet 600 mg  600 mg Oral BID [place] Eric J, DO   600 mg at 05/07/22 0845   hydrALAZINE (APRESOLINE) tablet 10 mg  10 mg Oral Q8H PRN [place] Eric J, DO   10 mg at 05/07/22 6606   levothyroxine (SYNTHROID) tablet 100 mcg  100 mcg Oral Q0600 Etta Quill, DO   100 mcg at 05/07/22 0600   melatonin tablet 3 mg  3 mg Oral QHS [place] Donnamarie Poag, DO   3 mg at 05/06/22 2009   mirtazapine (REMERON) tablet 7.5 mg  7.5 mg Oral QHS Etta Quill, DO   7.5 mg at 05/06/22 2004   ondansetron (ZOFRAN) tablet 4 mg  4 mg Oral Q6H PRN Etta Quill, DO       Or   ondansetron [facility]) injection 4 mg  4 mg Intravenous Q6H PRN Etta Quill, DO       QUEtiapine (SEROQUEL) tablet 25 mg  25 mg Oral QHS [place] Eric J, DO       simvastatin (ZOCOR) tablet 20 mg  20 mg Oral QPM Jennette Kettle M, DO   20 mg at 05/06/22 1709     Discharge Medications: Please see discharge summary for a list of discharge medications.  Relevant Imaging Results:  Relevant Lab Results:   Additional Information SSN: [id number]  Tahtiana Rozier F Tedi Hughson, LCSWA

## 2022-05-07 NOTE — Progress Notes (Cosign Needed Addendum)
Occupational Therapy Treatment Patient Details Name: William Williamson MRN: [id number] DOB: 09-Nov-1932 Today's Date: 05/07/2022   History of present illness 89 y.o. male adm 2/1 with medical history significant of Parkinson's disease.  Pt with cough, congestion, cough productive of mucus.  Increased confusion, urinary incontinence, hallucinations over past couple of days. PMHx:  atherosclerosis, tremors, R ear hearing loss, hyperthyroidism, LVH, glaucoma, orthostatic hypotension, autonomic dysfunction   OT comments  Patient up in recliner upon entry and agreeable to OT session. Patient able to ambulate from recliner to sink and performed grooming standing with min assist and increased time. Patient required seated rest break following in [facility]. Patient able to perform LB dressing with donning socks requiring assistance to initiate tasks due to difficulty reaching feet. Patient could benefit from [place] if son is able to assist with care.  Acute OT to continue to follow.    Recommendations for follow up therapy are one component of a multi-disciplinary discharge planning process, led by the attending physician.  Recommendations may be updated based on patient status, additional functional criteria and insurance authorization.    Follow Up Recommendations  Home health OT     Assistance Recommended at Discharge Intermittent Supervision/Assistance  Patient can return home with the following  Help with stairs or ramp for entrance;A little help with walking and/or transfers;A little help with bathing/dressing/bathroom;Assist for transportation;Assistance with cooking/housework   Equipment Recommendations  None recommended by OT    Recommendations for Other Services      Precautions / Restrictions Precautions Precautions: Fall Precaution Comments: confusion Restrictions Weight Bearing Restrictions: No       Mobility Bed Mobility Overal bed mobility: Needs Assistance              General bed mobility comments: OOB in recliner    Transfers Overall transfer level: Needs assistance Equipment used: Rolling walker (2 wheels) Transfers: Sit to/from Stand Sit to Stand: Mod assist           General transfer comment: verbal cues for hand placement from recliner and BSC. Occasional assist with managing RW     Balance Overall balance assessment: Needs assistance Sitting-balance support: Feet supported Sitting balance-Leahy Scale: Fair     Standing balance support: Single extremity supported, Bilateral upper extremity supported, During functional activity Standing balance-Leahy Scale: Poor Standing balance comment: stood at sink with one extremity support for balance while performing grooming tasks                           ADL either performed or assessed with clinical judgement   ADL Overall ADL's : Needs assistance/impaired Eating/Feeding: Set up;Sitting   Grooming: Wash/dry hands;Wash/dry face;Oral care;Minimal assistance;Standing Grooming Details (indicate cue type and reason): min assist with applying toothpaste and standing balance             Lower Body Dressing: Moderate assistance;Sitting/lateral leans Lower Body Dressing Details (indicate cue type and reason): required assistance to initiate donning socks able to pull up once over toes Toilet Transfer: Minimal assistance;Rolling walker (2 wheels);BSC/3in1 Toilet Transfer Details (indicate cue type and reason): verbal cues for hand placement           General ADL Comments: cues for safety    Extremity/Trunk Assessment              Vision       Perception     Praxis      Cognition Arousal/Alertness: Awake/alert Behavior During Therapy:  WFL for tasks assessed/performed Overall Cognitive Status: History of cognitive impairments - at baseline                                 General Comments: increased time to process        Exercises       Shoulder Instructions       General Comments Oriented today, a little slow with recall but remembers events leading up to admission, tranfer to [facility], and current situation.    Pertinent Vitals/ Pain       Pain Assessment Pain Assessment: 0-10 Pain Intervention(s): Monitored during session  Home Living                                          Prior Functioning/Environment              Frequency  Min 2X/week        Progress Toward Goals  OT Goals(current goals can now be found in the care plan section)  Progress towards OT goals: Progressing toward goals  Acute Rehab OT Goals Patient Stated Goal: get better OT Goal Formulation: With patient Time For Goal Achievement: 05/18/22 Potential to Achieve Goals: Good ADL Goals Pt Will Perform Grooming: standing;with set-up Pt Will Perform Lower Body Dressing: with set-up;sit to/from stand Pt Will Transfer to Toilet: with modified independence;ambulating;regular height toilet  Plan Discharge plan remains appropriate    Co-evaluation                 AM-PAC OT "6 Clicks" Daily Activity     Outcome Measure   Help from another person eating meals?: A Little Help from another person taking care of personal grooming?: A Little Help from another person toileting, which includes using toliet, bedpan, or urinal?: A Little Help from another person bathing (including washing, rinsing, drying)?: A Little Help from another person to put on and taking off regular upper body clothing?: A Little Help from another person to put on and taking off regular lower body clothing?: A Lot 6 Click Score: 17    End of Session Equipment Utilized During Treatment: Gait belt;Rolling walker (2 wheels)  OT Visit Diagnosis: Unsteadiness on feet (R26.81);History of falling (Z91.81);Muscle weakness (generalized) (M62.81)   Activity Tolerance Patient tolerated treatment well   Patient Left in chair;with call bell/phone  within reach;with chair alarm set   Nurse Communication Mobility status        Time: 4098-1191 OT Time Calculation (min): 31 min  Charges: OT General Charges $OT Visit: 1 Visit OT Treatments $Self Care/Home Management : 23-37 mins  Lodema Hong, [place]  Office [place] 05/07/2022, 1:34 PM

## 2022-05-07 NOTE — Progress Notes (Signed)
Physical Therapy Treatment Patient Details Name: William Williamson MRN: [id number] DOB: 1933-03-28 Today's Date: 05/07/2022   History of Present Illness 89 y.o. male adm 2/1 with medical history significant of Parkinson's disease.  Pt with cough, congestion, cough productive of mucus.  Increased confusion, urinary incontinence, hallucinations over past couple of days. PMHx:  atherosclerosis, tremors, R ear hearing loss, hyperthyroidism, LVH, glaucoma, orthostatic hypotension, autonomic dysfunction    PT Comments    Pt much more oriented today compared to prior PT session. Does still lack some insight into deficits and safety stating "I can fall a few times at home, I'll be fine". Up to intermittent mod assist with transfer and gait throughout room today. He is interested in CIR program and I do feel he would tolerate this well. He and his son live together, however son works from home. If son can provide supervision, I anticipate pt could progress to a supervision level with assistive devices if he undergoes aggressive rehab. Will ask for screen. Patient will continue to benefit from skilled physical therapy services to further improve independence with functional mobility.    Recommendations for follow up therapy are one component of a multi-disciplinary discharge planning process, led by the attending physician.  Recommendations may be updated based on patient status, additional functional criteria and insurance authorization.  Follow Up Recommendations  Acute inpatient rehab (3hours/day) (If not a candidate or not enough care at home from son upon d/c, will need SNF) Can patient physically be transported by private vehicle: Yes   Assistance Recommended at Discharge Intermittent Supervision/Assistance  Patient can return home with the following A lot of help with walking and/or transfers;A lot of help with bathing/dressing/bathroom;Assistance with cooking/housework;Direct supervision/assist  for medications management;Direct supervision/assist for financial management;Assist for transportation;Help with stairs or ramp for entrance   Equipment Recommendations  None recommended by PT    Recommendations for Other Services       Precautions / Restrictions Precautions Precautions: Fall Precaution Comments: confusion Restrictions Weight Bearing Restrictions: No     Mobility  Bed Mobility Overal bed mobility: Needs Assistance Bed Mobility: Supine to Sit     Supine to sit: Supervision     General bed mobility comments: supervision for safety. Requires extra time, some cues to facilitate.    Transfers Overall transfer level: Needs assistance Equipment used: Rolling walker (2 wheels) Transfers: Sit to/from Stand Sit to Stand: Mod assist (from recliner)           General transfer comment: Mod assist for boost to stand from bed with posterior lean noted, mod assist from toilet. Attempts to sit prematurely, needs cues to align with seat surface prior to sitting.    Ambulation/Gait Ambulation/Gait assistance: Mod assist Gait Distance (Feet): 15 Feet (+20) Assistive device: Rolling walker (2 wheels) Gait Pattern/deviations: Step-through pattern, Step-to pattern, Decreased stride length, Trunk flexed, Shuffle Gait velocity: reduced Gait velocity interpretation: <1.31 ft/sec, indicative of household ambulator   General Gait Details: Up to mod assist at times due to leaning and erratic control of RW. Knees flexed throughout gait cycle, slow shuffled steps, some LOB towards Rt side. Needs VC for awareness and physical assist for balance and RW control. First bout to bathroom mod A, second bout to recliner through room, mostly min assist but several episodes of freezing.   Stairs             Wheelchair Mobility    Modified Rankin (Stroke Patients Only)       Balance Overall balance  assessment: Needs assistance Sitting-balance support: Feet  supported Sitting balance-Leahy Scale: Fair     Standing balance support: Bilateral upper extremity supported, During functional activity Standing balance-Leahy Scale: Poor                              Cognition Arousal/Alertness: Awake/alert Behavior During Therapy: WFL for tasks assessed/performed Overall Cognitive Status: History of cognitive impairments - at baseline                                 General Comments: Some decreased safety and deficit awareness, slower processing at times.        Exercises General Exercises - Lower Extremity Ankle Circles/Pumps: AROM, Both, 15 reps, Seated Quad Sets: Strengthening, Both, 15 reps, Seated Gluteal Sets: Strengthening, Both, 10 reps, Seated    General Comments General comments (skin integrity, edema, etc.): Oriented today, a little slow with recall but remembers events leading up to admission, tranfer to [facility], and current situation.      Pertinent Vitals/Pain Pain Assessment Pain Assessment: No/denies pain    Home Living                          Prior Function            PT Goals (current goals can now be found in the care plan section) Acute Rehab PT Goals Patient Stated Goal: to walk again PT Goal Formulation: With patient Time For Goal Achievement: 05/18/22 Potential to Achieve Goals: Good Progress towards PT goals: Progressing toward goals    Frequency    Min 3X/week      PT Plan Discharge plan needs to be updated    Co-evaluation              AM-PAC PT "6 Clicks" Mobility   Outcome Measure  Help needed turning from your back to your side while in a flat bed without using bedrails?: A Little Help needed moving from lying on your back to sitting on the side of a flat bed without using bedrails?: A Little Help needed moving to and from a bed to a chair (including a wheelchair)?: A Lot Help needed standing up from a chair using your arms (e.g., wheelchair or  bedside chair)?: A Lot Help needed to walk in hospital room?: A Lot Help needed climbing 3-5 steps with a railing? : Total 6 Click Score: 13    End of Session Equipment Utilized During Treatment: Gait belt Activity Tolerance: Patient tolerated treatment well Patient left: in chair;with call bell/phone within reach;with chair alarm set Nurse Communication: Mobility status PT Visit Diagnosis: Unsteadiness on feet (R26.81);Muscle weakness (generalized) (M62.81);Difficulty in walking, not elsewhere classified (R26.2);History of falling (Z91.81);Repeated falls (R29.6)     Time: 0301-3143 PT Time Calculation (min) (ACUTE ONLY): 33 min  Charges:  $Gait Training: 8-22 mins $Therapeutic Activity: 8-22 mins                     Candie Mile, PT, DPT Physical Therapist Acute Rehabilitation Services [place]    Ellouise Newer 05/07/2022, 11:48 AM

## 2022-05-07 NOTE — Progress Notes (Signed)
Inpatient Rehab Admissions Coordinator:   Per therapy recommendations, patient was screened for CIR candidacy by Mileydi Milsap, MS, CCC-SLP. At this time, Pt. does not appear to demonstrate medical necessity to justify in hospital rehabilitation/CIR. will not pursue a rehab consult for this Pt.   Recommend other rehab venues to be pursued.  Please contact me with any questions.  Keiasha Diep, MS, CCC-SLP Rehab Admissions Coordinator  [phone] (celll) [phone] (office)   

## 2022-05-07 NOTE — TOC Progression Note (Signed)
Transition of Care [facility]) - Initial/Assessment Note    Patient Details  Name: William Williamson MRN: [id number] Date of Birth: 1932-07-16  [facility]) CM/SW Contact:    Milinda Antis, [place] Phone Number: 05/07/2022, 9:15 AM  Clinical Narrative:                 RE: William Williamson Date of Birth: Jun 16, 1932 Date: 05/07/2022  Please be advised that the above-named patient will require a short-term nursing home stay - anticipated 30 days or less for rehabilitation and strengthening.  The plan is for return home.        Patient Goals and CMS Choice            Expected Discharge Plan and Services                                              Prior Living Arrangements/Services                       Activities of Daily Living Home Assistive Devices/Equipment: Environmental consultant (specify type), Wheelchair, Cane (specify quad or straight) ADL Screening (condition at time of admission) Patient's cognitive ability adequate to safely complete daily activities?: No Is the patient deaf or have difficulty hearing?: No Does the patient have difficulty seeing, even when wearing glasses/contacts?: No Does the patient have difficulty concentrating, remembering, or making decisions?: Yes Patient able to express need for assistance with ADLs?: Yes Does the patient have difficulty dressing or bathing?: Yes Independently performs ADLs?: No Communication: Independent Dressing (OT): Needs assistance Is this a change from baseline?: Pre-admission baseline Grooming: Independent Feeding: Needs assistance Is this a change from baseline?: Pre-admission baseline Bathing: Needs assistance Is this a change from baseline?: Pre-admission baseline Toileting: Needs assistance Is this a change from baseline?: Pre-admission baseline In/Out Bed: Needs assistance Is this a change from baseline?: Pre-admission baseline Walks in Home: Needs assistance Is this a change from  baseline?: Pre-admission baseline Does the patient have difficulty walking or climbing stairs?: Yes Weakness of Legs: Both Weakness of Arms/Hands: None  Permission Sought/Granted                  Emotional Assessment              Admission diagnosis:  Transient alteration of awareness [D14.9] Acute metabolic encephalopathy [F02.63] Multifocal pneumonia [J18.9] Patient Active Problem List   Diagnosis Date Noted   Acute metabolic encephalopathy 78/58/8502   CAP (community acquired pneumonia) 05/03/2022   Loose stools 03/16/2021   Low back pain radiating to left leg 09/01/2020   Frequent falls 08/12/2020   Depression 05/14/2020   Insomnia 04/12/2020   Hallucinations 04/12/2020   Left elbow pain 04/12/2020   Left wrist pain 04/12/2020   Urinary incontinence 04/12/2020   Benign prostatic hyperplasia with urinary frequency 03/03/2019   Tinea corporis 03/03/2019   Elevated BP without diagnosis of hypertension 12/30/2018   Lower extremity edema 09/09/2018   Otalgia 08/26/2018   Preventative health care 08/16/2016   Hypothyroidism 08/16/2016   Prostate enlargement 08/16/2016   Autonomic dysfunction 02/02/2016   Ingrown toenail 05/17/2015   Right thigh pain 12/24/2014   H. pylori infection 06/21/2014   Primary hypertension 02/08/2014   Hyperlipidemia 02/08/2014   Parkinson disease 12/21/2013   Hearing loss 10/06/2013   Paralysis agitans 07/28/2013   UTI (urinary tract  infection) 05/27/2013   Nonspecific abnormal electrocardiogram (ECG) (EKG) 06/14/2011   Anemia, unspecified 06/13/2010   CONSTIPATION, CHRONIC 06/09/2009   Hypothyroidism following radioiodine therapy 07/07/2008   Vitamin D deficiency 01/09/2008   HYPERCHOLESTEROLEMIA 07/10/2007   Arthropathy 07/10/2007   G E R D 10/11/2006   PCP:  [name], DO Pharmacy:   [facility] [place] [place] [facility] [address] [place] [address] Phone:  [phone] Fax: [phone]     Social Determinants of Health ([place]) Social History: SDOH Screenings   Food Insecurity: Unknown (05/03/2022)  Housing: Low Risk  (05/03/2022)  Transportation Needs: No Transportation Needs (05/03/2022)  Utilities: Not At Risk (05/03/2022)  Alcohol Screen: Low Risk  (01/23/2021)  Depression (PHQ2-9): Low Risk  (02/14/2022)  Financial Resource Strain: Low Risk  (02/14/2022)  Physical Activity: Inactive (02/14/2022)  Social Connections: Socially Isolated (02/14/2022)  Stress: No Stress Concern Present (02/14/2022)  Tobacco Use: Medium Risk (05/03/2022)   SDOH Interventions:     Readmission Risk Interventions     No data to display

## 2022-05-07 NOTE — Care Management Important Message (Signed)
Important Message  Patient Details  Name: William Williamson MRN: [id number] Date of Birth: 1932-05-29   Medicare Important Message Given:  Yes     [name] Montine Circle 05/07/2022, 2:31 PM

## 2022-05-07 NOTE — Progress Notes (Signed)
PROGRESS NOTE    William Williamson  WGN:[id number] DOB: May 11, 1932 DOA: 05/03/2022 PCP: [name], DO    Brief Narrative:   William Williamson is a 89 y.o. male [employer] veteran with past medical history significant for Parkinson's disease/dementia, hypothyroidism, hyperlipidemia who presented to [facility] ED on 2/1 with increased confusion, hallucinations over the past 5 days.  Patient also during this timeframe has had a productive cough, congestion.  Per son, no overt aspiration episodes noted and no history of aspiration previously.  In the ED, temperature 97.7 F, HR 62, RR 18, BP 161/79, SpO2 96% on room air.  Sodium 134, potassium 4.1, chloride 100, CO2 28, glucose 99, BUN 29, creatinine 1.32.  WBC 8.2, hemoglobin 11.4, platelets 155.  Lactic acid 1.1.  Urinalysis unrevealing.  UDS negative.  EtOH level less than 10.  COVID/RSV/influenza PCR negative.  CT head without contrast with atrophy and chronic small vessel ischemic changes, no acute intracranial process identified.  CT chest without contrast with right middle lobe consolidation numerous solid nodules and patchy consolidations more pronounced in the bilateral lower lobes and right middle lobe likely due to aspiration versus infection, aortic atherosclerosis.  Assessment & Plan:   Acute metabolic encephalopathy Patient presenting to ED with increased confusion, hallucinations over the past several days.  UDS negative, EtOH level less than 10. Recent vitamin D and vitamin B12 level on 02/01/2022 within normal limits.  Was recently seen by his neurologist, Dr. Carles Collet on 1/25 with increase of Seroquel to 12.5 mg p.o. twice daily.  Concern for likely infectious etiology given CT chest concerning for pneumonia.  CT head with no acute findings.  COVID/RSV/influenza PCR negative.  Complicated by his history of underlying Parkinson's dementia. -- Continue treatment as below, supportive care  Community-acquired  pneumonia Patient presenting with confusion, productive cough.  CT chest without contrast with right middle lobe consolidation with numerous solid nodules and patchy consolidations concerning for aspiration versus infection. -- Azithromycin 500 mg PO q24h, plan 5-day course -- Ceftriaxone 2 g IV q24h, plan 7-day course -- Mucinex 600 mg PO BID -- Incentive spirometry/flutter valve -- Supportive care  Parkinson's disease/dementia Follows with neurology outpatient, Dr. Carles Collet; last seen in clinic on 1/25. -- Carbidopa/levodopa 2 tablets 7am/11am/3pm and 1 tablet at 7pm -- Carbidopa/levodopa 50/200 CR at bedtime -- Seroquel '25mg'$  PO qHS -- Melatonin 3 mg p.o. nightly -- Supportive care, fall precautions  Dysphagia Evaluated by speech therapy. Dysphagia 2 (Fine chop) solids;Nectar thick liquid Liquid Administration via: Cup;Straw Medication Administration: Crushed with puree Supervision: Intermittent supervision to cue for compensatory strategies;Comment (set-up assistance) Compensations: Minimize environmental distractions;Slow rate;Small sips/bites Postural Changes: Seated upright at 90 degrees;Remain semi-upright after after feeds/meals (Comment) Oral Care Recommendations: Oral care BID Other Recommendations: Order thickener from pharmacy;Prohibited food (jello, ice cream, thin soups);Remove water pitcher  Hypothyroidism TSH 1.78 on 02/01/2022. -- Levothyroxine 100 mcg p.o. daily  Hyperlipidemia -- Simvastatin 20 mg p.o. daily  HTN Previously was on lisinopril but was discontinued due to fluctuating blood pressures.  Blood pressure elevated up to 192/90 on 2/4. -- Start amlodipine 5 mg p.o. daily -- Continue monitor BP closely  Weakness/debility/gait disturbance: Per son, William Williamson; patient is currently on waiting list for [facility] facility. -- PT evaluation: Recommends SNF placement -- TOC consulted   DVT prophylaxis: enoxaparin (LOVENOX) injection 40 mg Start:  05/04/22 2000    Code Status: Full Code Family Communication: No family present at bedside this morning, updated patient's son William Williamson  on FaceTime call with his father in the room this morning  Disposition Plan:  Level of care: Med-Surg .Status is: Inpatient Remains inpatient appropriate because: IV antibiotics, SNF placement  Consultants:  None  Procedures:  None  Antimicrobials:  Azithromycin 2/1>> Ceftriaxone 2/1>>   Subjective: Patient seen examined bedside, resting comfortably.  Lying in bed.  No family present, but son William Williamson currently on FaceTime call on his father's telephone.  No specific complaints this morning other than wanting to make sure he does not get put back in restraints/mitts.  Continues on IV antibiotics.  Denies headache, no visual changes, no chest pain, no palpitations, no abdominal pain, no fever/chills/night sweats, no nausea/vomiting/diarrhea, no focal weakness, no paresthesias.  No acute events overnight per nursing staff.  Pending SNF placement.  Objective: Vitals:   05/06/22 1623 05/06/22 2147 05/07/22 0613 05/07/22 0847  BP: (!) 98/47 (!) 151/82 (!) 148/75 (!) 181/96  Pulse: 63 66 71 72  Resp: '18 15 15 18  '$ Temp: 99.3 F (37.4 C) 97.9 F (36.6 C) 98.3 F (36.8 C) 98.1 F (36.7 C)  TempSrc: Oral  Oral Oral  SpO2: 100% 93% 92% 96%  Weight:      Height:        Intake/Output Summary (Last 24 hours) at 05/07/2022 0848 Last data filed at 05/07/2022 5361 Gross per 24 hour  Intake 240 ml  Output 1700 ml  Net -1460 ml   Filed Weights   05/03/22 1234 05/03/22 2059 05/06/22 0546  Weight: 77.1 kg 67 kg 66.6 kg    Examination:  Physical Exam: GEN: NAD, alert and oriented x 3 to Place [facility]), Time (2024), and person (President: Biden), thin in appearance, slight resting tremor noted HEENT: NCAT, PERRL, EOMI, sclera clear, MMM PULM: Breath sounds slight diminished bilateral bases, coarse breath sounds right midlung to base, normal respiratory effort  without accessory muscle use, no wheezing, on room air with SpO2 96% at rest. CV: RRR w/o M/G/R GI: abd soft, NTND, NABS, no R/G/M MSK: no peripheral edema, moves extremities independently NEURO: Slight resting tremor noted from underlying Parkinson disease, otherwise no focal deficits appreciated PSYCH: Depressed mood, flat affect Integumentary: dry/intact, no rashes or wounds    Data Reviewed: I have personally reviewed following labs and imaging studies  CBC: Recent Labs  Lab 05/03/22 1307 05/04/22 0649 05/05/22 0440  WBC 8.2 6.1 5.1  HGB 11.4* 10.4* 10.6*  HCT 34.2* 32.0* 31.3*  MCV 94.2 95.2 94.0  PLT 155 152 443   Basic Metabolic Panel: Recent Labs  Lab 05/03/22 1307 05/04/22 0649 05/05/22 0440 05/07/22 0201  NA 134* 138 140 138  K 4.1 3.9 3.5 3.8  CL 100 104 107 103  CO2 '28 26 25 27  '$ GLUCOSE 99 92 95 98  BUN 29* '20 14 10  '$ CREATININE 1.32* 1.17 1.06 1.03  CALCIUM 8.6* 8.3* 8.2* 8.9  MG  --   --  2.0 1.9   GFR: Estimated Creatinine Clearance: 47.6 mL/min (by C-G formula based on SCr of 1.03 mg/dL). Liver Function Tests: Recent Labs  Lab 05/03/22 1307  AST 26  ALT <5  ALKPHOS 51  BILITOT 0.9  PROT 7.0  ALBUMIN 3.5   No results for input(s): "LIPASE", "AMYLASE" in the last 168 hours. No results for input(s): "AMMONIA" in the last 168 hours. Coagulation Profile: No results for input(s): "INR", "PROTIME" in the last 168 hours. Cardiac Enzymes: No results for input(s): "CKTOTAL", "CKMB", "CKMBINDEX", "TROPONINI" in the last 168 hours. BNP (last 3  results) No results for input(s): "PROBNP" in the last 8760 hours. HbA1C: No results for input(s): "HGBA1C" in the last 72 hours. CBG: Recent Labs  Lab 05/03/22 1339  GLUCAP 75   Lipid Profile: No results for input(s): "CHOL", "HDL", "LDLCALC", "TRIG", "CHOLHDL", "LDLDIRECT" in the last 72 hours. Thyroid Function Tests: No results for input(s): "TSH", "T4TOTAL", "FREET4", "T3FREE", "THYROIDAB" in the  last 72 hours. Anemia Panel: No results for input(s): "VITAMINB12", "FOLATE", "FERRITIN", "TIBC", "IRON", "RETICCTPCT" in the last 72 hours. Sepsis Labs: Recent Labs  Lab 05/03/22 1307  LATICACIDVEN 1.1    Recent Results (from the past 240 hour(s))  Resp panel by RT-PCR (RSV, Flu A&B, Covid) Anterior Nasal Swab     Status: None   Collection Time: 05/03/22  2:15 PM   Specimen: Anterior Nasal Swab  Result Value Ref Range Status   SARS Coronavirus 2 by RT PCR NEGATIVE NEGATIVE Final    Comment: (NOTE) SARS-CoV-2 target nucleic acids are NOT DETECTED.  The SARS-CoV-2 RNA is generally detectable in upper respiratory specimens during the acute phase of infection. The lowest concentration of SARS-CoV-2 viral copies this assay can detect is 138 copies/mL. A negative result does not preclude SARS-Cov-2 infection and should not be used as the sole basis for treatment or other patient management decisions. A negative result may occur with  improper specimen collection/handling, submission of specimen other than nasopharyngeal swab, presence of viral mutation(s) within the areas targeted by this assay, and inadequate number of viral copies(<138 copies/mL). A negative result must be combined with clinical observations, patient history, and epidemiological information. The expected result is Negative.  Fact Sheet for Patients:  EntrepreneurPulse.com.au  Fact Sheet for Healthcare Providers:  IncredibleEmployment.be  This test is no t yet approved or cleared by the [place] FDA and  has been authorized for detection and/or diagnosis of SARS-CoV-2 by FDA under an Emergency Use Authorization (EUA). This EUA will remain  in effect (meaning this test can be used) for the duration of the COVID-19 declaration under Section 564(b)(1) of the Act, 21 U.S.C.section 360bbb-3(b)(1), unless the authorization is terminated  or revoked sooner.       Influenza  A by PCR NEGATIVE NEGATIVE Final   Influenza B by PCR NEGATIVE NEGATIVE Final    Comment: (NOTE) The Xpert Xpress SARS-CoV-2/FLU/RSV plus assay is intended as an aid in the diagnosis of influenza from Nasopharyngeal swab specimens and should not be used as a sole basis for treatment. Nasal washings and aspirates are unacceptable for Xpert Xpress SARS-CoV-2/FLU/RSV testing.  Fact Sheet for Patients: EntrepreneurPulse.com.au  Fact Sheet for Healthcare Providers: IncredibleEmployment.be  This test is not yet approved or cleared by the [place] FDA and has been authorized for detection and/or diagnosis of SARS-CoV-2 by FDA under an Emergency Use Authorization (EUA). This EUA will remain in effect (meaning this test can be used) for the duration of the COVID-19 declaration under Section 564(b)(1) of the Act, 21 U.S.C. section 360bbb-3(b)(1), unless the authorization is terminated or revoked.     Resp Syncytial Virus by PCR NEGATIVE NEGATIVE Final    Comment: (NOTE) Fact Sheet for Patients: EntrepreneurPulse.com.au  Fact Sheet for Healthcare Providers: IncredibleEmployment.be  This test is not yet approved or cleared by the [place] FDA and has been authorized for detection and/or diagnosis of SARS-CoV-2 by FDA under an Emergency Use Authorization (EUA). This EUA will remain in effect (meaning this test can be used) for the duration of the COVID-19 declaration under Section 564(b)(1) of the  Act, 21 U.S.C. section 360bbb-3(b)(1), unless the authorization is terminated or revoked.  Performed at [facility], [address], [place],  [address]          Radiology Studies: No results found.      Scheduled Meds:  amLODipine  5 mg Oral Daily   azithromycin  500 mg Oral Q24H   carbidopa-levodopa  1 tablet Oral QHS   carbidopa-levodopa  1 tablet Oral Daily    carbidopa-levodopa  2 tablet Oral TID   enoxaparin (LOVENOX) injection  40 mg Subcutaneous Q24H   guaiFENesin  600 mg Oral BID   levothyroxine  100 mcg Oral Q0600   melatonin  3 mg Oral QHS   mirtazapine  7.5 mg Oral QHS   QUEtiapine  25 mg Oral QHS   simvastatin  20 mg Oral QPM   Continuous Infusions:  sodium chloride Stopped (05/03/22 1944)   cefTRIAXone (ROCEPHIN)  IV 2 g (05/06/22 0825)   cefTRIAXone (ROCEPHIN)  IV       LOS: 3 days    Time spent: 52 minutes spent on chart review, discussion with nursing staff, consultants, updating family and interview/physical exam; more than 50% of that time was spent in counseling and/or coordination of care.    Lennox Dolberry J [place] DO [facility] Available via Epic secure chat 7am-7pm After these hours, please refer to coverage provider listed on [email or web address] 05/07/2022, 8:48 AM

## 2022-05-08 LAB — HIV-1/HIV-2 QUALITATIVE RNA
Final Interpretation: NEGATIVE
HIV-1 RNA, Qualitative: NONREACTIVE
HIV-2 RNA, Qualitative: NONREACTIVE

## 2022-05-08 LAB — HIV-1/2 AB - DIFFERENTIATION
HIV 1 Ab: NONREACTIVE
HIV 2 Ab: NONREACTIVE
Note: NEGATIVE

## 2022-05-08 MED ORDER — SENNOSIDES-DOCUSATE SODIUM 8.6-50 MG PO TABS
1.0000 | ORAL_TABLET | Freq: Two times a day (BID) | ORAL | Status: DC
  Administered 2022-05-08 – 2022-05-11 (×7): 1 via ORAL
  Filled 2022-05-08 (×7): qty 1

## 2022-05-08 MED ORDER — POLYETHYLENE GLYCOL 3350 17 G PO PACK
17.0000 g | PACK | Freq: Every day | ORAL | Status: DC | PRN
  Administered 2022-05-08: 17 g via ORAL
  Filled 2022-05-08: qty 1

## 2022-05-08 MED ORDER — DOCUSATE SODIUM 50 MG/5ML PO LIQD
100.0000 mg | Freq: Every day | ORAL | Status: DC | PRN
  Administered 2022-05-08: 100 mg via ORAL
  Filled 2022-05-08 (×2): qty 10

## 2022-05-08 MED ORDER — DOCUSATE SODIUM 100 MG PO CAPS: 100.0000 mg | ORAL_CAPSULE | Freq: Every day | ORAL | Status: DC | PRN

## 2022-05-08 MED ORDER — AMLODIPINE BESYLATE 10 MG PO TABS
10.0000 mg | ORAL_TABLET | Freq: Every day | ORAL | Status: DC
  Administered 2022-05-08 – 2022-05-11 (×4): 10 mg via ORAL
  Filled 2022-05-08 (×4): qty 1

## 2022-05-08 NOTE — Progress Notes (Signed)
Speech Language Pathology Treatment: Dysphagia  Patient Details Name: William Williamson MRN: [id number] DOB: [birth year] Today's Date: 05/08/2022 Time: 1062-6948 SLP Time Calculation (min) (ACUTE ONLY): 13 min  Assessment / Plan / Recommendation Clinical Impression  Pt seen this morning and asking very eagerly to have ice water. He says he does not want thickener, but can verbalize that thickener was recommended because thin liquids went down the "wrong tube." SLP did offer him a few ice chips, which were followed by intermittent coughing. Although this has the potential to be indicative of aspiration, note that his aspiration was silent on MBS, and he had coughing at times throughout trials today regardless of consistency. Education was offered to pt about silent nature of aspiration, making clinical assessment for improvement very limited. Cueing was provided for smaller sips throughout intake, but would consider a trial of rehabilitative exercises, such as EMST, to see if he can make improvement given what appears to be an acute on chronic dysphagia.   HPI HPI: 89 y.o. male adm 2/1 with cough, congestion, cough productive of mucus. Increased confusion, urinary incontinence, hallucinations over past couple of days. CT showed R middle lobe consolidation and numerous solid nodules and patchy consolidations which are most pronounced in the bilateral lower lobes and R middle lobes. Findings likely due to aspiration or infection. Pt had an OP MBS in 2019 that showed mild oral and moderate pharyngeal dysphagia that included "impaired pharyngeal contraction, tongue base retraction and laryngeal elevation results in residuals across all consistencies." He did not aspirate or frankly penetrate but various postures did not reduce residue. He was recommended to have OP SLP but pt says he did not pursue this. PMH: Parkinson's disease, GERD, hearing loss R ear, glaucoma, hyperthyroidism      SLP Plan  Continue  with current plan of care      Recommendations for follow up therapy are one component of a multi-disciplinary discharge planning process, led by the attending physician.  Recommendations may be updated based on patient status, additional functional criteria and insurance authorization.    Recommendations  Diet recommendations: Dysphagia 2 (fine chop);Nectar-thick liquid Liquids provided via: Cup;Straw Medication Administration: Crushed with puree Supervision: Patient able to self feed;Intermittent supervision to cue for compensatory strategies Compensations: Minimize environmental distractions;Slow rate;Small sips/bites Postural Changes and/or Swallow Maneuvers: Seated upright 90 degrees;Upright 30-60 min after meal                Oral Care Recommendations: Oral care BID Follow Up Recommendations: Skilled nursing-short term rehab (<3 hours/day) Assistance recommended at discharge: Intermittent Supervision/Assistance SLP Visit Diagnosis: Dysphagia, pharyngeal phase (R13.13) Plan: Continue with current plan of care           William Williamson., M.A. [place] Office [phone]  Secure chat preferred   05/08/2022, 11:23 AM

## 2022-05-08 NOTE — Progress Notes (Signed)
PROGRESS NOTE    William Williamson  QPR:[id number] DOB: May 15, 1932 DOA: 05/03/2022 PCP: [name], DO    Brief Narrative:   William Williamson is a 89 y.o. male [employer] veteran with past medical history significant for Parkinson's disease/dementia, hypothyroidism, hyperlipidemia who presented to [facility] ED on 2/1 with increased confusion, hallucinations over the past 5 days.  Patient also during this timeframe has had a productive cough, congestion.  Per son, no overt aspiration episodes noted and no history of aspiration previously.  In the ED, temperature 97.7 F, HR 62, RR 18, BP 161/79, SpO2 96% on room air.  Sodium 134, potassium 4.1, chloride 100, CO2 28, glucose 99, BUN 29, creatinine 1.32.  WBC 8.2, hemoglobin 11.4, platelets 155.  Lactic acid 1.1.  Urinalysis unrevealing.  UDS negative.  EtOH level less than 10.  COVID/RSV/influenza PCR negative.  CT head without contrast with atrophy and chronic small vessel ischemic changes, no acute intracranial process identified.  CT chest without contrast with right middle lobe consolidation numerous solid nodules and patchy consolidations more pronounced in the bilateral lower lobes and right middle lobe likely due to aspiration versus infection, aortic atherosclerosis.  Assessment & Plan:   Acute metabolic encephalopathy Patient presenting to ED with increased confusion, hallucinations over the past several days.  UDS negative, EtOH level less than 10. Recent vitamin D and vitamin B12 level on 02/01/2022 within normal limits.  Was recently seen by his neurologist, Dr. Carles Collet on 1/25 with increase of Seroquel to 12.5 mg p.o. twice daily.  Concern for likely infectious etiology given CT chest concerning for pneumonia.  CT head with no acute findings.  COVID/RSV/influenza PCR negative.  Complicated by his history of underlying Parkinson's dementia. -- Continue treatment as below, supportive care  Community-acquired  pneumonia Patient presenting with confusion, productive cough.  CT chest without contrast with right middle lobe consolidation with numerous solid nodules and patchy consolidations concerning for aspiration versus infection. -- Azithromycin 500 mg PO q24h, plan 5-day course -- Ceftriaxone 2 g IV q24h, plan 7-day course -- Mucinex 600 mg PO BID -- Incentive spirometry/flutter valve -- Supportive care  Parkinson's disease/dementia Follows with neurology outpatient, Dr. Carles Collet; last seen in clinic on 1/25. -- Carbidopa/levodopa 2 tablets 7am/11am/3pm and 1 tablet at 7pm -- Carbidopa/levodopa 50/200 CR at bedtime -- Seroquel '25mg'$  PO qHS -- Melatonin 3 mg p.o. nightly -- Supportive care, fall precautions  Dysphagia Evaluated by speech therapy. Dysphagia 2 (Fine chop) solids;Nectar thick liquid Liquid Administration via: Cup;Straw Medication Administration: Crushed with puree Supervision: Intermittent supervision to cue for compensatory strategies;Comment (set-up assistance) Compensations: Minimize environmental distractions;Slow rate;Small sips/bites Postural Changes: Seated upright at 90 degrees;Remain semi-upright after after feeds/meals (Comment) Oral Care Recommendations: Oral care BID Other Recommendations: Order thickener from pharmacy;Prohibited food (jello, ice cream, thin soups);Remove water pitcher  Hypothyroidism TSH 1.78 on 02/01/2022. -- Levothyroxine 100 mcg p.o. daily  Hyperlipidemia -- Simvastatin 20 mg p.o. daily  HTN Previously was on lisinopril but was discontinued due to fluctuating blood pressures.  Blood pressure elevated up to 192/90 on 2/4. -- Start amlodipine 5 mg p.o. daily -- Continue monitor BP closely  Weakness/debility/gait disturbance: Per son, [name]; patient is currently [facility]. -- PT evaluation: Recommends SNF placement -- TOC consulted   DVT prophylaxis: enoxaparin (LOVENOX) injection 40 mg Start:  05/04/22 2000    Code Status: Full Code Family Communication: No family present at bedside this morning  Disposition Plan:  Level of care: Med-Surg .Status is: Inpatient Remains inpatient appropriate because: IV antibiotics, SNF placement  Consultants:  None  Procedures:  None  Antimicrobials:  Azithromycin 2/1>> Ceftriaxone 2/1>>   Subjective: Patient seen examined bedside, resting comfortably.  Sitting in bedside chair this morning.  Just finished working with physical therapy.  No family present.  No specific complaints at this time.  Still continues with weakness, deconditioning and gait disturbance with recommendations of SNF placement.  No other questions or concerns at this time.  Denies headache, no visual changes, no chest pain, no palpitations, no abdominal pain, no fever/chills/night sweats, no nausea/vomiting/diarrhea, no focal weakness, no paresthesias.  No acute events overnight per nursing staff.  Pending SNF placement.  Objective: Vitals:   05/07/22 1649 05/07/22 2041 05/08/22 0539 05/08/22 0922  BP: (!) 162/83 (!) 150/76 (!) 140/78 (!) 103/51  Pulse: 68 73 66 73  Resp:  '18 18 16  '$ Temp: 98.1 F (36.7 C) 98.1 F (36.7 C) 98.5 F (36.9 C) (!) 97.3 F (36.3 C)  TempSrc: Oral Oral Oral Oral  SpO2: 93% 93% 94% 95%  Weight:      Height:        Intake/Output Summary (Last 24 hours) at 05/08/2022 1015 Last data filed at 05/08/2022 0900 Gross per 24 hour  Intake 800 ml  Output 750 ml  Net 50 ml   Filed Weights   05/03/22 1234 05/03/22 2059 05/06/22 0546  Weight: 77.1 kg 67 kg 66.6 kg    Examination:  Physical Exam: GEN: NAD, alert and oriented x 3 to Place [facility]), Time (2024), and person (President: Biden), thin in appearance, slight resting tremor noted HEENT: NCAT, PERRL, EOMI, sclera clear, MMM PULM: Breath sounds slight diminished bilateral bases, coarse breath sounds right midlung to base, normal respiratory effort without accessory muscle use, no  wheezing, on room air with SpO2 96% at rest. CV: RRR w/o M/G/R GI: abd soft, NTND, NABS, no R/G/M MSK: no peripheral edema, moves extremities independently NEURO: Slight resting tremor noted from underlying Parkinson disease, otherwise no focal deficits appreciated PSYCH: Depressed mood, flat affect Integumentary: dry/intact, no rashes or wounds    Data Reviewed: I have personally reviewed following labs and imaging studies  CBC: Recent Labs  Lab 05/03/22 1307 05/04/22 0649 05/05/22 0440  WBC 8.2 6.1 5.1  HGB 11.4* 10.4* 10.6*  HCT 34.2* 32.0* 31.3*  MCV 94.2 95.2 94.0  PLT 155 152 939   Basic Metabolic Panel: Recent Labs  Lab 05/03/22 1307 05/04/22 0649 05/05/22 0440 05/07/22 0201  NA 134* 138 140 138  K 4.1 3.9 3.5 3.8  CL 100 104 107 103  CO2 '28 26 25 27  '$ GLUCOSE 99 92 95 98  BUN 29* '20 14 10  '$ CREATININE 1.32* 1.17 1.06 1.03  CALCIUM 8.6* 8.3* 8.2* 8.9  MG  --   --  2.0 1.9   GFR: Estimated Creatinine Clearance: 47.6 mL/min (by C-G formula based on SCr of 1.03 mg/dL). Liver Function Tests: Recent Labs  Lab 05/03/22 1307  AST 26  ALT <5  ALKPHOS 51  BILITOT 0.9  PROT 7.0  ALBUMIN 3.5   No results for input(s): "LIPASE", "AMYLASE" in the last 168 hours. No results for input(s): "AMMONIA" in the last 168 hours. Coagulation Profile: No results for input(s): "INR", "PROTIME" in the last 168 hours. Cardiac Enzymes: No results for input(s): "CKTOTAL", "CKMB", "CKMBINDEX", "TROPONINI" in the last 168 hours. BNP (last 3 results) No results for input(s): "PROBNP" in the last 8760  hours. HbA1C: No results for input(s): "HGBA1C" in the last 72 hours. CBG: Recent Labs  Lab 05/03/22 1339 05/07/22 1649  GLUCAP 75 99   Lipid Profile: No results for input(s): "CHOL", "HDL", "LDLCALC", "TRIG", "CHOLHDL", "LDLDIRECT" in the last 72 hours. Thyroid Function Tests: No results for input(s): "TSH", "T4TOTAL", "FREET4", "T3FREE", "THYROIDAB" in the last 72  hours. Anemia Panel: No results for input(s): "VITAMINB12", "FOLATE", "FERRITIN", "TIBC", "IRON", "RETICCTPCT" in the last 72 hours. Sepsis Labs: Recent Labs  Lab 05/03/22 1307  LATICACIDVEN 1.1    Recent Results (from the past 240 hour(s))  Resp panel by RT-PCR (RSV, Flu A&B, Covid) Anterior Nasal Swab     Status: None   Collection Time: 05/03/22  2:15 PM   Specimen: Anterior Nasal Swab  Result Value Ref Range Status   SARS Coronavirus 2 by RT PCR NEGATIVE NEGATIVE Final    Comment: (NOTE) SARS-CoV-2 target nucleic acids are NOT DETECTED.  The SARS-CoV-2 RNA is generally detectable in upper respiratory specimens during the acute phase of infection. The lowest concentration of SARS-CoV-2 viral copies this assay can detect is 138 copies/mL. A negative result does not preclude SARS-Cov-2 infection and should not be used as the sole basis for treatment or other patient management decisions. A negative result may occur with  improper specimen collection/handling, submission of specimen other than nasopharyngeal swab, presence of viral mutation(s) within the areas targeted by this assay, and inadequate number of viral copies(<138 copies/mL). A negative result must be combined with clinical observations, patient history, and epidemiological information. The expected result is Negative.  Fact Sheet for Patients:  EntrepreneurPulse.com.au  Fact Sheet for Healthcare Providers:  IncredibleEmployment.be  This test is no t yet approved or cleared by the [place] FDA and  has been authorized for detection and/or diagnosis of SARS-CoV-2 by FDA under an Emergency Use Authorization (EUA). This EUA will remain  in effect (meaning this test can be used) for the duration of the COVID-19 declaration under Section 564(b)(1) of the Act, 21 U.S.C.section 360bbb-3(b)(1), unless the authorization is terminated  or revoked sooner.       Influenza A by PCR  NEGATIVE NEGATIVE Final   Influenza B by PCR NEGATIVE NEGATIVE Final    Comment: (NOTE) The Xpert Xpress SARS-CoV-2/FLU/RSV plus assay is intended as an aid in the diagnosis of influenza from Nasopharyngeal swab specimens and should not be used as a sole basis for treatment. Nasal washings and aspirates are unacceptable for Xpert Xpress SARS-CoV-2/FLU/RSV testing.  Fact Sheet for Patients: EntrepreneurPulse.com.au  Fact Sheet for Healthcare Providers: IncredibleEmployment.be  This test is not yet approved or cleared by the [place] FDA and has been authorized for detection and/or diagnosis of SARS-CoV-2 by FDA under an Emergency Use Authorization (EUA). This EUA will remain in effect (meaning this test can be used) for the duration of the COVID-19 declaration under Section 564(b)(1) of the Act, 21 U.S.C. section 360bbb-3(b)(1), unless the authorization is terminated or revoked.     Resp Syncytial Virus by PCR NEGATIVE NEGATIVE Final    Comment: (NOTE) Fact Sheet for Patients: EntrepreneurPulse.com.au  Fact Sheet for Healthcare Providers: IncredibleEmployment.be  This test is not yet approved or cleared by the [place] FDA and has been authorized for detection and/or diagnosis of SARS-CoV-2 by FDA under an Emergency Use Authorization (EUA). This EUA will remain in effect (meaning this test can be used) for the duration of the COVID-19 declaration under Section 564(b)(1) of the Act, 21 U.S.C. section 360bbb-3(b)(1), unless the  authorization is terminated or revoked.  Performed at [facility], [address], [place], [place] [address]          Radiology Studies: No results found.      Scheduled Meds:  amLODipine  10 mg Oral Daily   azithromycin  500 mg Oral Q24H   carbidopa-levodopa  1 tablet Oral QHS   carbidopa-levodopa  1 tablet Oral Daily   carbidopa-levodopa  2  tablet Oral TID   enoxaparin (LOVENOX) injection  40 mg Subcutaneous Q24H   guaiFENesin  600 mg Oral BID   levothyroxine  100 mcg Oral Q0600   melatonin  3 mg Oral QHS   mirtazapine  7.5 mg Oral QHS   QUEtiapine  25 mg Oral QHS   senna-docusate  1 tablet Oral BID   simvastatin  20 mg Oral QPM   Continuous Infusions:  sodium chloride Stopped (05/03/22 1944)   cefTRIAXone (ROCEPHIN)  IV       LOS: 4 days    Time spent: 48 minutes spent on chart review, discussion with nursing staff, consultants, updating family and interview/physical exam; more than 50% of that time was spent in counseling and/or coordination of care.    Skylier Kretschmer J [place] DO [facility] Available via Epic secure chat 7am-7pm After these hours, please refer to coverage provider listed on [email or web address] 05/08/2022, 10:15 AM

## 2022-05-08 NOTE — TOC Progression Note (Addendum)
Transition of Care [facility]) - Initial/Assessment Note    Patient Details  Name: William Williamson MRN: [id number] Date of Birth: 06-04-1932  [facility] CM/SW Contact:    Milinda Antis, LCSWA Phone Number: 05/08/2022, 10:32 AM  Clinical Narrative:                 LCSW contacted the patient's son, Favio Moder, to present bed offers.  The son is reviewing the facilities and will informed LCSW of decision.  12:30- The family's facility of choice is [facility].  LCSW contacted the facility and solidified the bed offer.  The facility can accept once insurance authorization is approved.  A voicemail was left for Tammy at [facility] requesting insurance authorization.  TOC following.         Patient Goals and CMS Choice            Expected Discharge Plan and Services                                              Prior Living Arrangements/Services                       Activities of Daily Living Home Assistive Devices/Equipment: Environmental consultant (specify type), Wheelchair, Cane (specify quad or straight) ADL Screening (condition at time of admission) Patient's cognitive ability adequate to safely complete daily activities?: No Is the patient deaf or have difficulty hearing?: No Does the patient have difficulty seeing, even when wearing glasses/contacts?: No Does the patient have difficulty concentrating, remembering, or making decisions?: Yes Patient able to express need for assistance with ADLs?: Yes Does the patient have difficulty dressing or bathing?: Yes Independently performs ADLs?: No Communication: Independent Dressing (OT): Needs assistance Is this a change from baseline?: Pre-admission baseline Grooming: Independent Feeding: Needs assistance Is this a change from baseline?: Pre-admission baseline Bathing: Needs assistance Is this a change from baseline?: Pre-admission baseline Toileting: Needs assistance Is this a change  from baseline?: Pre-admission baseline In/Out Bed: Needs assistance Is this a change from baseline?: Pre-admission baseline Walks in Home: Needs assistance Is this a change from baseline?: Pre-admission baseline Does the patient have difficulty walking or climbing stairs?: Yes Weakness of Legs: Both Weakness of Arms/Hands: None  Permission Sought/Granted                  Emotional Assessment              Admission diagnosis:  Transient alteration of awareness [J82.5] Acute metabolic encephalopathy [K53.97] Multifocal pneumonia [J18.9] Patient Active Problem List   Diagnosis Date Noted   Acute metabolic encephalopathy 67/34/1937   CAP (community acquired pneumonia) 05/03/2022   Loose stools 03/16/2021   Low back pain radiating to left leg 09/01/2020   Frequent falls 08/12/2020   Depression 05/14/2020   Insomnia 04/12/2020   Hallucinations 04/12/2020   Left elbow pain 04/12/2020   Left wrist pain 04/12/2020   Urinary incontinence 04/12/2020   Benign prostatic hyperplasia with urinary frequency 03/03/2019   Tinea corporis 03/03/2019   Elevated BP without diagnosis of hypertension 12/30/2018   Lower extremity edema 09/09/2018   Otalgia 08/26/2018   Preventative health care 08/16/2016   Hypothyroidism 08/16/2016   Prostate enlargement 08/16/2016   Autonomic dysfunction 02/02/2016   Ingrown toenail 05/17/2015   Right thigh pain 12/24/2014   H. pylori infection  06/21/2014   Primary hypertension 02/08/2014   Hyperlipidemia 02/08/2014   Parkinson disease 12/21/2013   Hearing loss 10/06/2013   Paralysis agitans 07/28/2013   UTI (urinary tract infection) 05/27/2013   Nonspecific abnormal electrocardiogram (ECG) (EKG) 06/14/2011   Anemia, unspecified 06/13/2010   CONSTIPATION, CHRONIC 06/09/2009   Hypothyroidism following radioiodine therapy 07/07/2008   Vitamin D deficiency 01/09/2008   HYPERCHOLESTEROLEMIA 07/10/2007   Arthropathy 07/10/2007   G E R D  10/11/2006   PCP:  [name], DO Pharmacy:   [place], [place] - [address] [place] [address] Phone: [phone] Fax: [phone]     Social Determinants of Health ([place]) Social History: SDOH Screenings   Food Insecurity: Unknown (05/03/2022)  Housing: Low Risk  (05/03/2022)  Transportation Needs: No Transportation Needs (05/03/2022)  Utilities: Not At Risk (05/03/2022)  Alcohol Screen: Low Risk  (01/23/2021)  Depression (PHQ2-9): Low Risk  (02/14/2022)  Financial Resource Strain: Low Risk  (02/14/2022)  Physical Activity: Inactive (02/14/2022)  Social Connections: Socially Isolated (02/14/2022)  Stress: No Stress Concern Present (02/14/2022)  Tobacco Use: Medium Risk (05/03/2022)   SDOH Interventions:     Readmission Risk Interventions     No data to display

## 2022-05-08 NOTE — Progress Notes (Signed)
Patient rested all night. Patient did request for a laxative this morning.

## 2022-05-08 NOTE — Progress Notes (Signed)
Occupational Therapy Treatment Patient Details Name: William Williamson MRN: [id number] DOB: 08/08/1932 Today's Date: 05/08/2022   History of present illness 89 y.o. male adm 2/1 with medical history significant of Parkinson's disease.  Pt with cough, congestion, cough productive of mucus.  Increased confusion, urinary incontinence, hallucinations over past couple of days. PMHx:  atherosclerosis, tremors, R ear hearing loss, hyperthyroidism, LVH, glaucoma, orthostatic hypotension, autonomic dysfunction   OT comments  Patient not progressing as anticipated with ADL completion and in room mobility/toileting.  Patient needing more assist compared to evaluation, goals downgraded, and discharge recommendation changed to SNF for continued post acute rehab prior to returning home.  OT to continue efforts in the acute setting to address deficits, and assist with the transition to next level of care.      Recommendations for follow up therapy are one component of a multi-disciplinary discharge planning process, led by the attending physician.  Recommendations may be updated based on patient status, additional functional criteria and insurance authorization.    Follow Up Recommendations  Skilled nursing-short term rehab (<3 hours/day)     Assistance Recommended at Discharge Frequent or constant Supervision/Assistance  Patient can return home with the following  A lot of help with bathing/dressing/bathroom;A lot of help with walking and/or transfers;Assist for transportation;Assistance with cooking/housework;Direct supervision/assist for medications management;Direct supervision/assist for financial management   Equipment Recommendations  None recommended by OT    Recommendations for Other Services      Precautions / Restrictions Precautions Precautions: Fall Precaution Comments: confusion at times, more pronounced in the afternoon. Restrictions Weight Bearing Restrictions: No       Mobility  Bed Mobility                    Transfers Overall transfer level: Needs assistance Equipment used: Rolling walker (2 wheels) Transfers: Sit to/from Stand Sit to Stand: Mod assist                 Balance           Standing balance support: Reliant on assistive device for balance, Bilateral upper extremity supported Standing balance-Leahy Scale: Poor                             ADL either performed or assessed with clinical judgement   ADL Overall ADL's : Needs assistance/impaired Eating/Feeding: Minimal assistance;Sitting   Grooming: Wash/dry hands;Wash/dry face;Minimal assistance;Sitting           Upper Body Dressing : Minimal assistance;Moderate assistance;Sitting   Lower Body Dressing: Moderate assistance;Sitting/lateral leans;Maximal assistance   Toilet Transfer: Minimal assistance;Moderate assistance;Stand-pivot;Rolling walker (2 wheels)                  Extremity/Trunk Assessment Upper Extremity Assessment Upper Extremity Assessment: Generalized weakness;RUE deficits/detail;LUE deficits/detail RUE Deficits / Details: tremors RUE Coordination: decreased fine motor;decreased gross motor LUE Deficits / Details: tremors LUE Coordination: decreased fine motor;decreased gross motor   Lower Extremity Assessment Lower Extremity Assessment: Defer to PT evaluation   Cervical / Trunk Assessment Cervical / Trunk Assessment: Kyphotic    Vision       Perception     Praxis      Cognition Arousal/Alertness: Awake/alert Behavior During Therapy: WFL for tasks assessed/performed Overall Cognitive Status: History of cognitive impairments - at baseline  Pertinent Vitals/ Pain       Pain Assessment Pain Assessment: No/denies pain Pain Intervention(s): Monitored during session                                                           Frequency  Min 2X/week        Progress Toward Goals  OT Goals(current goals can now be found in the care plan section)  Progress towards OT goals: Goals drowngraded-see care plan  Acute Rehab OT Goals OT Goal Formulation: With patient Time For Goal Achievement: 05/18/22 Potential to Achieve Goals: Good ADL Goals Pt Will Perform Lower Body Dressing: with min guard assist;sit to/from stand Pt Will Transfer to Toilet: with min guard assist;regular height toilet;ambulating  Plan Discharge plan needs to be updated    Co-evaluation                 AM-PAC OT "6 Clicks" Daily Activity     Outcome Measure   Help from another person eating meals?: A Little Help from another person taking care of personal grooming?: A Little Help from another person toileting, which includes using toliet, bedpan, or urinal?: A Lot Help from another person bathing (including washing, rinsing, drying)?: A Lot Help from another person to put on and taking off regular upper body clothing?: A Lot Help from another person to put on and taking off regular lower body clothing?: A Lot 6 Click Score: 14    End of Session Equipment Utilized During Treatment: Gait belt;Rolling walker (2 wheels)  OT Visit Diagnosis: Unsteadiness on feet (R26.81);History of falling (Z91.81);Muscle weakness (generalized) (M62.81);Other symptoms and signs involving cognitive function   Activity Tolerance Patient tolerated treatment well   Patient Left in chair;with call bell/phone within reach;with chair alarm set   Nurse Communication Mobility status        Time: 3762-8315 OT Time Calculation (min): 17 min  Charges: OT General Charges $OT Visit: 1 Visit OT Treatments $Self Care/Home Management : 8-22 mins  05/08/2022  RP, OTR/L  Acute Rehabilitation Services  Office:  [phone]   Metta Clines 05/08/2022, 12:45 PM

## 2022-05-08 NOTE — Progress Notes (Signed)
Physical Therapy Treatment Patient Details Name: William Williamson MRN: [id number] DOB: 11/19/1932 Today's Date: 05/08/2022   History of Present Illness 89 y.o. male adm 2/1 with medical history significant of Parkinson's disease.  Pt with cough, congestion, cough productive of mucus.  Increased confusion, urinary incontinence, hallucinations over past couple of days. PMHx:  atherosclerosis, tremors, R ear hearing loss, hyperthyroidism, LVH, glaucoma, orthostatic hypotension, autonomic dysfunction    PT Comments    Remains very motivated although frustrated by rapid decline in function from illness. Up to mod assist with transfers and gait, however progressing to min assist with practice and repetition. Further training with sit<>stand techniques, gait through room with verbal tempo cues for sequencing due to intermittent freezing. Although patient would likely do incredibly well with AIR, it was reported that he did not meet medical complexity for admission. He will require SNF placement for rehab to regain his independence prior to returning home. Patient will continue to benefit from skilled physical therapy services to further improve independence with functional mobility.    Recommendations for follow up therapy are one component of a multi-disciplinary discharge planning process, led by the attending physician.  Recommendations may be updated based on patient status, additional functional criteria and insurance authorization.  Follow Up Recommendations  Acute inpatient rehab (3hours/day) ((Pt unfortunately did not qualify for CIR - Will need SNF)) Can patient physically be transported by private vehicle: Yes   Assistance Recommended at Discharge Intermittent Supervision/Assistance  Patient can return home with the following A lot of help with walking and/or transfers;A lot of help with bathing/dressing/bathroom;Assistance with cooking/housework;Direct supervision/assist for medications  management;Direct supervision/assist for financial management;Assist for transportation;Help with stairs or ramp for entrance   Equipment Recommendations  None recommended by PT    Recommendations for Other Services       Precautions / Restrictions Precautions Precautions: Fall Precaution Comments: confusion Restrictions Weight Bearing Restrictions: No     Mobility  Bed Mobility Overal bed mobility: Needs Assistance Bed Mobility: Supine to Sit     Supine to sit: Supervision     General bed mobility comments: Requires VC and extra time.    Transfers Overall transfer level: Needs assistance Equipment used: Rolling walker (2 wheels) Transfers: Sit to/from Stand Sit to Stand: Mod assist           General transfer comment: Mod assist for boost to stand, progressed to min assist after several practice trials. Cues for technique, performed from bed.    Ambulation/Gait Ambulation/Gait assistance: Mod assist Gait Distance (Feet): 20 Feet Assistive device: Rolling walker (2 wheels) Gait Pattern/deviations: Step-through pattern, Step-to pattern, Decreased stride length, Trunk flexed, Shuffle Gait velocity: reduced Gait velocity interpretation: <1.31 ft/sec, indicative of household ambulator   General Gait Details: Still required up to mod assist today for balance and RW control. Intermittent freezing. Verbal tempo cues to assist with sequencing. No buckling however LEs flexed throughout gait cycle, shuffled steps, chorea type movements of head and trunk primarily.   Stairs             Wheelchair Mobility    Modified Rankin (Stroke Patients Only)       Balance Overall balance assessment: Needs assistance Sitting-balance support: Feet supported Sitting balance-Leahy Scale: Fair     Standing balance support: Single extremity supported, Bilateral upper extremity supported, During functional activity Standing balance-Leahy Scale: Poor Standing balance  comment: Requires support  Cognition Arousal/Alertness: Awake/alert Behavior During Therapy: WFL for tasks assessed/performed Overall Cognitive Status: History of cognitive impairments - at baseline                                 General Comments: increased time to process        Exercises General Exercises - Lower Extremity Ankle Circles/Pumps: AROM, Both, 15 reps, Seated Quad Sets: Strengthening, Both, 15 reps, Seated Gluteal Sets: Strengthening, Both, 10 reps, Seated    General Comments        Pertinent Vitals/Pain Pain Assessment Pain Assessment: No/denies pain Pain Intervention(s): Monitored during session    Home Living                          Prior Function            PT Goals (current goals can now be found in the care plan section) Acute Rehab PT Goals Patient Stated Goal: to walk again PT Goal Formulation: With patient Time For Goal Achievement: 05/18/22 Potential to Achieve Goals: Good Progress towards PT goals: Progressing toward goals    Frequency    Min 3X/week      PT Plan Current plan remains appropriate    Co-evaluation              AM-PAC PT "6 Clicks" Mobility   Outcome Measure  Help needed turning from your back to your side while in a flat bed without using bedrails?: A Little Help needed moving from lying on your back to sitting on the side of a flat bed without using bedrails?: A Little Help needed moving to and from a bed to a chair (including a wheelchair)?: A Lot Help needed standing up from a chair using your arms (e.g., wheelchair or bedside chair)?: A Lot Help needed to walk in hospital room?: A Lot Help needed climbing 3-5 steps with a railing? : Total 6 Click Score: 13    End of Session Equipment Utilized During Treatment: Gait belt Activity Tolerance: Patient tolerated treatment well Patient left: in chair;with call bell/phone within reach;with  chair alarm set Nurse Communication: Mobility status PT Visit Diagnosis: Unsteadiness on feet (R26.81);Muscle weakness (generalized) (M62.81);Difficulty in walking, not elsewhere classified (R26.2);History of falling (Z91.81);Repeated falls (R29.6)     Time: 1324-4010 PT Time Calculation (min) (ACUTE ONLY): 23 min  Charges:  $Gait Training: 8-22 mins $Therapeutic Activity: 8-22 mins                     Candie Mile, PT, DPT Physical Therapist Acute Rehabilitation Services [place]    Ellouise Newer 05/08/2022, 11:22 AM

## 2022-05-09 DIAGNOSIS — G9341 Metabolic encephalopathy: Secondary | ICD-10-CM | POA: Diagnosis not present

## 2022-05-09 MED ORDER — POLYETHYLENE GLYCOL 3350 17 G PO PACK: 17.0000 g | PACK | Freq: Every day | ORAL | 0 refills | Status: DC | PRN

## 2022-05-09 MED ORDER — MIRTAZAPINE 7.5 MG PO TABS: 7.5000 mg | ORAL_TABLET | Freq: Every day | ORAL | 0 refills | Status: DC

## 2022-05-09 MED ORDER — SENNOSIDES-DOCUSATE SODIUM 8.6-50 MG PO TABS: 1.0000 | ORAL_TABLET | Freq: Two times a day (BID) | ORAL | Status: DC

## 2022-05-09 MED ORDER — AMLODIPINE BESYLATE 10 MG PO TABS: 10.0000 mg | ORAL_TABLET | Freq: Every day | ORAL | Status: DC

## 2022-05-09 MED ORDER — QUETIAPINE FUMARATE 25 MG PO TABS: 25.0000 mg | ORAL_TABLET | Freq: Every day | ORAL | 0 refills | Status: DC

## 2022-05-09 MED ORDER — MELATONIN 3 MG PO TABS: 3.0000 mg | ORAL_TABLET | Freq: Every day | ORAL | 0 refills | Status: DC

## 2022-05-09 NOTE — TOC Progression Note (Signed)
Transition of Care [facility]) - Initial/Assessment Note    Patient Details  Name: William Williamson MRN: [id number] Date of Birth: [birth year]  [facility]) CM/SW Contact:    Milinda Antis, LCSWA Phone Number: 05/09/2022, 10:37 AM  Clinical Narrative:                Insurance authorization has been approved.   SNF authorization number- [id number]; Ambulance authorization number- [id number]  MD notified.         Patient Goals and CMS Choice            Expected Discharge Plan and Services                                              Prior Living Arrangements/Services                       Activities of Daily Living Home Assistive Devices/Equipment: Environmental consultant (specify type), Wheelchair, Cane (specify quad or straight) ADL Screening (condition at time of admission) Patient's cognitive ability adequate to safely complete daily activities?: No Is the patient deaf or have difficulty hearing?: No Does the patient have difficulty seeing, even when wearing glasses/contacts?: No Does the patient have difficulty concentrating, remembering, or making decisions?: Yes Patient able to express need for assistance with ADLs?: Yes Does the patient have difficulty dressing or bathing?: Yes Independently performs ADLs?: No Communication: Independent Dressing (OT): Needs assistance Is this a change from baseline?: Pre-admission baseline Grooming: Independent Feeding: Needs assistance Is this a change from baseline?: Pre-admission baseline Bathing: Needs assistance Is this a change from baseline?: Pre-admission baseline Toileting: Needs assistance Is this a change from baseline?: Pre-admission baseline In/Out Bed: Needs assistance Is this a change from baseline?: Pre-admission baseline Walks in Home: Needs assistance Is this a change from baseline?: Pre-admission baseline Does the patient have difficulty walking or climbing stairs?: Yes Weakness of Legs:  Both Weakness of Arms/Hands: None  Permission Sought/Granted                  Emotional Assessment              Admission diagnosis:  Transient alteration of awareness [H82.9] Acute metabolic encephalopathy [H37.16] Multifocal pneumonia [J18.9] Patient Active Problem List   Diagnosis Date Noted   Acute metabolic encephalopathy 96/78/9381   CAP (community acquired pneumonia) 05/03/2022   Loose stools 03/16/2021   Low back pain radiating to left leg 09/01/2020   Frequent falls 08/12/2020   Depression 05/14/2020   Insomnia 04/12/2020   Hallucinations 04/12/2020   Left elbow pain 04/12/2020   Left wrist pain 04/12/2020   Urinary incontinence 04/12/2020   Benign prostatic hyperplasia with urinary frequency 03/03/2019   Tinea corporis 03/03/2019   Elevated BP without diagnosis of hypertension 12/30/2018   Lower extremity edema 09/09/2018   Otalgia 08/26/2018   Preventative health care 08/16/2016   Hypothyroidism 08/16/2016   Prostate enlargement 08/16/2016   Autonomic dysfunction 02/02/2016   Ingrown toenail 05/17/2015   Right thigh pain 12/24/2014   H. pylori infection 06/21/2014   Primary hypertension 02/08/2014   Hyperlipidemia 02/08/2014   Parkinson disease 12/21/2013   Hearing loss 10/06/2013   Paralysis agitans 07/28/2013   UTI (urinary tract infection) 05/27/2013   Nonspecific abnormal electrocardiogram (ECG) (EKG) 06/14/2011   Anemia, unspecified 06/13/2010   CONSTIPATION, CHRONIC 06/09/2009   Hypothyroidism  following radioiodine therapy 07/07/2008   Vitamin D deficiency 01/09/2008   HYPERCHOLESTEROLEMIA 07/10/2007   Arthropathy 07/10/2007   G E R D 10/11/2006   PCP:  [name], DO Pharmacy:   [id number] [place], [place] - [address] [place] [address] Phone: [phone] Fax: [phone]     Social Determinants of Health ([place]) Social History: SDOH Screenings   Food  Insecurity: Unknown (05/03/2022)  Housing: Low Risk  (05/03/2022)  Transportation Needs: No Transportation Needs (05/03/2022)  Utilities: Not At Risk (05/03/2022)  Alcohol Screen: Low Risk  (01/23/2021)  Depression (PHQ2-9): Low Risk  (02/14/2022)  Financial Resource Strain: Low Risk  (02/14/2022)  Physical Activity: Inactive (02/14/2022)  Social Connections: Socially Isolated (02/14/2022)  Stress: No Stress Concern Present (02/14/2022)  Tobacco Use: Medium Risk (05/03/2022)   SDOH Interventions:     Readmission Risk Interventions     No data to display

## 2022-05-09 NOTE — TOC Transition Note (Signed)
Transition of Care [facility]) - CM/SW Discharge Note   Patient Details  Name: William Williamson MRN: [id number] Date of Birth: [birth year]  Transition of Care [facility]) CM/SW Contact:  Milinda Antis, [place] Phone Number: 05/09/2022, 12:10 PM   Clinical Narrative:    Patient will DC to: [facility] Anticipated DC date: 05/09/2022 Family notified: yes, son Sales promotion account executive by: Corey Harold   Per MD patient ready for DC to SNF. RN to call report prior to discharge [phone] room 905A). RN, patient's family, and facility notified of DC. Discharge Summary sent to facility. DC packet on chart. Ambulance transport will be requested for patient.   CSW will sign off for now as social work intervention is no longer needed. Please consult [place] again if new needs arise.    Final next level of care: [place] Barriers to Discharge: Barriers Resolved   Patient Goals and CMS Choice      Discharge Placement PASRR number recieved:  ([id number]) PASRR number recieved:  ([id number])            Patient [facility] bed at: [facility] Patient to be transferred to facility by: [place] Name of family member notified: Aksel, Bencomo (Son) [phone] Patient and family notified of of transfer: 05/09/22  Discharge Plan and Services Additional resources added to the After Visit Summary for                                       Social Determinants of Health ([place]) Interventions SDOH Screenings   Food Insecurity: Unknown (05/03/2022)  Housing: Low Risk  (05/03/2022)  Transportation Needs: No Transportation Needs (05/03/2022)  Utilities: Not At Risk (05/03/2022)  Alcohol Screen: Low Risk  (01/23/2021)  Depression (PHQ2-9): Low Risk  (02/14/2022)  Financial Resource Strain: Low Risk  (02/14/2022)  Physical Activity: Inactive (02/14/2022)  Social Connections: Socially Isolated (02/14/2022)  Stress: No Stress Concern Present (02/14/2022)  Tobacco Use: Medium Risk (05/03/2022)      Readmission Risk Interventions     No data to display

## 2022-05-09 NOTE — Progress Notes (Signed)
PROGRESS NOTE    William Williamson  JJO:[id number] DOB: [birth year] DOA: 05/03/2022 PCP: [name], DO    Brief Narrative:   William Williamson is a 89 y.o. male [employer] veteran with past medical history significant for Parkinson's disease/dementia, hypothyroidism, hyperlipidemia who presented to [facility] ED on 2/1 with increased confusion, hallucinations over the past 5 days.  Patient also during this timeframe has had a productive cough, congestion.  Per son, no overt aspiration episodes noted and no history of aspiration previously.  In the ED, temperature 97.7 F, HR 62, RR 18, BP 161/79, SpO2 96% on room air.  Sodium 134, potassium 4.1, chloride 100, CO2 28, glucose 99, BUN 29, creatinine 1.32.  WBC 8.2, hemoglobin 11.4, platelets 155.  Lactic acid 1.1.  Urinalysis unrevealing.  UDS negative.  EtOH level less than 10.  COVID/RSV/influenza PCR negative.  CT head without contrast with atrophy and chronic small vessel ischemic changes, no acute intracranial process identified.  CT chest without contrast with right middle lobe consolidation numerous solid nodules and patchy consolidations more pronounced in the bilateral lower lobes and right middle lobe likely due to aspiration versus infection, aortic atherosclerosis.  Assessment & Plan:   Acute metabolic encephalopathy Patient presenting to ED with increased confusion, hallucinations over the past several days.  UDS negative, EtOH level less than 10. Recent vitamin D and vitamin B12 level on 02/01/2022 within normal limits.  Was recently seen by his neurologist, Dr. Carles Collet on 1/25 with increase of Seroquel to 12.5 mg p.o. twice daily.  Concern for likely infectious etiology given CT chest concerning for pneumonia.  CT head with no acute findings.  COVID/RSV/influenza PCR negative.  Complicated by his history of underlying Parkinson's dementia. -- Continue treatment as below, supportive care  Community-acquired  pneumonia Patient presenting with confusion, productive cough.  CT chest without contrast with right middle lobe consolidation with numerous solid nodules and patchy consolidations concerning for aspiration versus infection.  Completed 5-day course of azithromycin and 7-day course of ceftriaxone. -- Mucinex 600 mg PO BID -- Incentive spirometry/flutter valve -- Supportive care  Parkinson's disease/dementia Follows with neurology outpatient, Dr. Carles Collet; last seen in clinic on 1/25. -- Carbidopa/levodopa 2 tablets 7am/11am/3pm and 1 tablet at 7pm -- Carbidopa/levodopa 50/200 CR at bedtime -- Seroquel '25mg'$  PO qHS -- Melatonin 3 mg p.o. nightly -- Supportive care, fall precautions  Dysphagia Evaluated by speech therapy. Dysphagia 2 (Fine chop) solids;Nectar thick liquid Liquid Administration via: Cup;Straw Medication Administration: Crushed with puree Supervision: Intermittent supervision to cue for compensatory strategies;Comment (set-up assistance) Compensations: Minimize environmental distractions;Slow rate;Small sips/bites Postural Changes: Seated upright at 90 degrees;Remain semi-upright after after feeds/meals (Comment) Oral Care Recommendations: Oral care BID Other Recommendations: Order thickener from pharmacy;Prohibited food (jello, ice cream, thin soups);Remove water pitcher  Hypothyroidism TSH 1.78 on 02/01/2022. -- Levothyroxine 100 mcg p.o. daily  Hyperlipidemia -- Simvastatin 20 mg p.o. daily  HTN Previously was on lisinopril but was discontinued due to fluctuating blood pressures.  Blood pressure elevated up to 192/90 on 2/4. -- Start amlodipine 5 mg p.o. daily -- Continue monitor BP closely  Weakness/debility/gait disturbance: Per son, [name]; patient is currently on waiting list for [facility] facility. -- PT evaluation: Recommends SNF placement -- TOC consulted   DVT prophylaxis: enoxaparin (LOVENOX) injection 40 mg Start: 05/04/22 2000    Code Status: Full  Code Family Communication: No family present at bedside this morning  Disposition Plan:  Level of care: Med-Surg .Status is: Inpatient Remains inpatient  appropriate because: IV antibiotics, SNF placement  Consultants:  None  Procedures:  None  Antimicrobials:  Azithromycin 2/1>> Ceftriaxone 2/1>>   Subjective: Patient seen examined bedside, resting comfortably.  Lying in bed.  No specific complaints this morning.  [facility] rescinded bed offer today.  No other questions or concerns at this time.  Denies headache, no visual changes, no chest pain, no palpitations, no abdominal pain, no fever/chills/night sweats, no nausea/vomiting/diarrhea, no focal weakness, no paresthesias.  No acute events overnight per nursing staff.  Pending SNF placement.  Objective: Vitals:   05/08/22 1626 05/08/22 2128 05/09/22 0541 05/09/22 0933  BP: (!) 174/92 122/73 (!) 153/82 (!) 154/76  Pulse: 73 72 64 68  Resp: '18 18 18 16  '$ Temp: 98 F (36.7 C) 97.7 F (36.5 C) 98.5 F (36.9 C) 98.4 F (36.9 C)  TempSrc:  Oral Oral Oral  SpO2: 97% 92% 96% 96%  Weight:      Height:        Intake/Output Summary (Last 24 hours) at 05/09/2022 1446 Last data filed at 05/09/2022 0858 Gross per 24 hour  Intake 440 ml  Output 1300 ml  Net -860 ml   Filed Weights   05/03/22 1234 05/03/22 2059 05/06/22 0546  Weight: 77.1 kg 67 kg 66.6 kg    Examination:  Physical Exam: GEN: NAD, alert and oriented x 3 to [facility]), Time (2024), and person (President: Biden), thin in appearance, slight resting tremor noted HEENT: NCAT, PERRL, EOMI, sclera clear, MMM PULM: Breath sounds slight diminished bilateral bases, coarse breath sounds right midlung to base, normal respiratory effort without accessory muscle use, no wheezing, on room air with SpO2 96% at rest. CV: RRR w/o M/G/R GI: abd soft, NTND, NABS, no R/G/M MSK: no peripheral edema, moves extremities independently NEURO: Slight resting tremor noted from  underlying Parkinson disease, otherwise no focal deficits appreciated PSYCH: Depressed mood, flat affect Integumentary: dry/intact, no rashes or wounds    Data Reviewed: I have personally reviewed following labs and imaging studies  CBC: Recent Labs  Lab 05/03/22 1307 05/04/22 0649 05/05/22 0440  WBC 8.2 6.1 5.1  HGB 11.4* 10.4* 10.6*  HCT 34.2* 32.0* 31.3*  MCV 94.2 95.2 94.0  PLT 155 152 892   Basic Metabolic Panel: Recent Labs  Lab 05/03/22 1307 05/04/22 0649 05/05/22 0440 05/07/22 0201  NA 134* 138 140 138  K 4.1 3.9 3.5 3.8  CL 100 104 107 103  CO2 '28 26 25 27  '$ GLUCOSE 99 92 95 98  BUN 29* '20 14 10  '$ CREATININE 1.32* 1.17 1.06 1.03  CALCIUM 8.6* 8.3* 8.2* 8.9  MG  --   --  2.0 1.9   GFR: Estimated Creatinine Clearance: 47.6 mL/min (by C-G formula based on SCr of 1.03 mg/dL). Liver Function Tests: Recent Labs  Lab 05/03/22 1307  AST 26  ALT <5  ALKPHOS 51  BILITOT 0.9  PROT 7.0  ALBUMIN 3.5   No results for input(s): "LIPASE", "AMYLASE" in the last 168 hours. No results for input(s): "AMMONIA" in the last 168 hours. Coagulation Profile: No results for input(s): "INR", "PROTIME" in the last 168 hours. Cardiac Enzymes: No results for input(s): "CKTOTAL", "CKMB", "CKMBINDEX", "TROPONINI" in the last 168 hours. BNP (last 3 results) No results for input(s): "PROBNP" in the last 8760 hours. HbA1C: No results for input(s): "HGBA1C" in the last 72 hours. CBG: Recent Labs  Lab 05/03/22 1339 05/07/22 1649  GLUCAP 75 99   Lipid Profile: No results for  input(s): "CHOL", "HDL", "LDLCALC", "TRIG", "CHOLHDL", "LDLDIRECT" in the last 72 hours. Thyroid Function Tests: No results for input(s): "TSH", "T4TOTAL", "FREET4", "T3FREE", "THYROIDAB" in the last 72 hours. Anemia Panel: No results for input(s): "VITAMINB12", "FOLATE", "FERRITIN", "TIBC", "IRON", "RETICCTPCT" in the last 72 hours. Sepsis Labs: Recent Labs  Lab 05/03/22 1307  LATICACIDVEN 1.1     Recent Results (from the past 240 hour(s))  Resp panel by RT-PCR (RSV, Flu A&B, Covid) Anterior Nasal Swab     Status: None   Collection Time: 05/03/22  2:15 PM   Specimen: Anterior Nasal Swab  Result Value Ref Range Status   SARS Coronavirus 2 by RT PCR NEGATIVE NEGATIVE Final    Comment: (NOTE) SARS-CoV-2 target nucleic acids are NOT DETECTED.  The SARS-CoV-2 RNA is generally detectable in upper respiratory specimens during the acute phase of infection. The lowest concentration of SARS-CoV-2 viral copies this assay can detect is 138 copies/mL. A negative result does not preclude SARS-Cov-2 infection and should not be used as the sole basis for treatment or other patient management decisions. A negative result may occur with  improper specimen collection/handling, submission of specimen other than nasopharyngeal swab, presence of viral mutation(s) within the areas targeted by this assay, and inadequate number of viral copies(<138 copies/mL). A negative result must be combined with clinical observations, patient history, and epidemiological information. The expected result is Negative.  Fact Sheet for Patients:  EntrepreneurPulse.com.au  Fact Sheet for Healthcare Providers:  IncredibleEmployment.be  This test is no t yet approved or cleared by the [place] FDA and  has been authorized for detection and/or diagnosis of SARS-CoV-2 by FDA under an Emergency Use Authorization (EUA). This EUA will remain  in effect (meaning this test can be used) for the duration of the COVID-19 declaration under Section 564(b)(1) of the Act, 21 U.S.C.section 360bbb-3(b)(1), unless the authorization is terminated  or revoked sooner.       Influenza A by PCR NEGATIVE NEGATIVE Final   Influenza B by PCR NEGATIVE NEGATIVE Final    Comment: (NOTE) The Xpert Xpress SARS-CoV-2/FLU/RSV plus assay is intended as an aid in the diagnosis of influenza from  Nasopharyngeal swab specimens and should not be used as a sole basis for treatment. Nasal washings and aspirates are unacceptable for Xpert Xpress SARS-CoV-2/FLU/RSV testing.  Fact Sheet for Patients: EntrepreneurPulse.com.au  Fact Sheet for Healthcare Providers: IncredibleEmployment.be  This test is not yet approved or cleared by the [place] FDA and has been authorized for detection and/or diagnosis of SARS-CoV-2 by FDA under an Emergency Use Authorization (EUA). This EUA will remain in effect (meaning this test can be used) for the duration of the COVID-19 declaration under Section 564(b)(1) of the Act, 21 U.S.C. section 360bbb-3(b)(1), unless the authorization is terminated or revoked.     Resp Syncytial Virus by PCR NEGATIVE NEGATIVE Final    Comment: (NOTE) Fact Sheet for Patients: EntrepreneurPulse.com.au  Fact Sheet for Healthcare Providers: IncredibleEmployment.be  This test is not yet approved or cleared by the [place] FDA and has been authorized for detection and/or diagnosis of SARS-CoV-2 by FDA under an Emergency Use Authorization (EUA). This EUA will remain in effect (meaning this test can be used) for the duration of the COVID-19 declaration under Section 564(b)(1) of the Act, 21 U.S.C. section 360bbb-3(b)(1), unless the authorization is terminated or revoked.  Performed at [facility], [address] [place], [place] [address]          Radiology Studies: No  results found.      Scheduled Meds:  amLODipine  10 mg Oral Daily   carbidopa-levodopa  1 tablet Oral QHS   carbidopa-levodopa  1 tablet Oral Daily   carbidopa-levodopa  2 tablet Oral TID   enoxaparin (LOVENOX) injection  40 mg Subcutaneous Q24H   guaiFENesin  600 mg Oral BID   levothyroxine  100 mcg Oral Q0600   melatonin  3 mg Oral QHS   mirtazapine  7.5 mg Oral QHS   QUEtiapine  25 mg Oral  QHS   senna-docusate  1 tablet Oral BID   simvastatin  20 mg Oral QPM   Continuous Infusions:  sodium chloride Stopped (05/03/22 1944)   cefTRIAXone (ROCEPHIN)  IV 2 g (05/09/22 0854)     LOS: 5 days    Time spent: 46 minutes spent on chart review, discussion with nursing staff, consultants, updating family and interview/physical exam; more than 50% of that time was spent in counseling and/or coordination of care.    Isha Seefeld J [place] DO [facility] Available via Epic secure chat 7am-7pm After these hours, please refer to coverage provider listed on [email or web address] 05/09/2022, 2:46 PM

## 2022-05-09 NOTE — Progress Notes (Signed)
Mobility Specialist Progress Note   05/09/22 1050  Mobility  Activity Ambulated with assistance in room;Transferred from bed to chair  Level of Assistance Moderate assist, patient does 50-74%  Assistive Device Front wheel walker  Distance Ambulated (ft) 32 ft  Activity Response Tolerated well  Mobility Referral Yes  $Mobility charge 1 Mobility   Pt pleasant and agreeable this morning. ModA to stand and modA throughout ambulation d/t R lateral lean. Following directional cues well w/ increased time but having poor spatial awareness. X1 seated rest break d/t fatigue. Placed in chair w/o fault, call bell in reach and chair alarm on.   [place]land Falling Mobility Specialist Please contact via SecureChat or  Rehab office at [phone]

## 2022-05-09 NOTE — Discharge Summary (Addendum)
Physician Discharge Summary  William Williamson [id number] DOB: [birth year] DOA: 05/03/2022  PCP: [name], DO  Admit date: 05/03/2022 Discharge date: 05/11/2022  Admitted From: Home Disposition: [facility]  Recommendations for Outpatient Follow-up:  Follow up with PCP in 1-2 weeks   Discharge Condition: Stable CODE STATUS: Full Code   Diet recommendation:  Dysphagia 2 (fine chop);Nectar-thick liquid Liquids provided via: Cup;Straw Medication Administration: Crushed with puree Supervision: Patient able to self feed;Intermittent supervision to cue for compensatory strategies Compensations: Minimize environmental distractions;Slow rate;Small sips/bites Postural Changes and/or Swallow Maneuvers: Seated upright 90 degrees;Upright 30-60 min after meal  History of present illness: William Williamson is a 89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  Patient also during this timeframe has had a productive cough, congestion.  Per son, no overt aspiration episodes noted and no history of aspiration previously.   In the ED, temperature 97.7 F, HR 62, RR 18, BP 161/79, SpO2 96% on room air.  Sodium 134, potassium 4.1, chloride 100, CO2 28, glucose 99, BUN 29, creatinine 1.32.  WBC 8.2, hemoglobin 11.4, platelets 155.  Lactic acid 1.1.  Urinalysis unrevealing.  UDS negative.  EtOH level less than 10.  COVID/RSV/influenza PCR negative.  CT head without contrast with atrophy and chronic small vessel ischemic changes, no acute intracranial process identified.  CT chest without contrast with right middle lobe consolidation numerous solid nodules and patchy consolidations more pronounced in the bilateral lower lobes and right middle lobe likely due to aspiration versus infection, aortic  atherosclerosis.  Hospital course:  Acute metabolic encephalopathy Patient presenting to ED with increased confusion, hallucinations over the past several days.  UDS negative, EtOH level less than 10. Recent vitamin D and vitamin B12 level on 02/01/2022 within normal limits.  Was recently seen by his neurologist, Dr. Carles Collet on 1/25 with increase of Seroquel to 12.5 mg p.o. twice daily.  Concern for likely infectious etiology given CT chest concerning for pneumonia.  CT head with no acute findings.  COVID/RSV/influenza PCR negative.  Complicated by his history of underlying Parkinson's dementia.  Confusion now appears to be resolved and cognition back at his typical baseline.   Community-acquired pneumonia Patient presenting with confusion, productive cough.  CT chest without contrast with right middle lobe consolidation with numerous solid nodules and patchy consolidations concerning for aspiration versus infection.  Completed antibiotic course with azithromycin for 5 days and ceftriaxone for 7 days.  Continue use of incentive spirometer, flutter valve.  Supportive care.   Parkinson's disease/dementia Follows with neurology outpatient, Dr. Carles Collet; last seen in clinic on 1/25.  Continue home Carbidopa/levodopa 2 tablets 7am/11am/3pm and 1 tablet at 7pm, Carbidopa/levodopa 50/200 CR at bedtime. Started on Seroquel 60m PO qHS andMelatonin 3 mg p.o. nightly   Dysphagia Evaluated by speech therapy. Dysphagia 2 (Fine chop) solids;Nectar thick liquid Liquid Administration via: Cup;Straw Medication Administration: Crushed with puree Supervision: Intermittent supervision to cue for compensatory strategies;Comment (set-up assistance) Compensations: Minimize environmental distractions;Slow rate;Small sips/bites Postural Changes: Seated upright at 90 degrees;Remain semi-upright after after feeds/meals (Comment) Oral Care Recommendations: Oral care BID Other Recommendations: Order thickener from  pharmacy;Prohibited food (jello, ice cream, thin soups);Remove water pitcher   Hypothyroidism TSH 1.78 on 02/01/2022. Levothyroxine 100 mcg p.o. daily   Hyperlipidemia Simvastatin 20 mg p.o. daily   HTN Previously was on lisinopril but was discontinued due  to fluctuating blood pressures.  Blood pressure elevated up to 192/90 on 2/4.  Started on amlodipine and uptitrated to 10 mg p.o. daily at time of discharge.  Recommend close follow-up of blood pressure and may need additional therapy if needed.   Weakness/debility/gait disturbance: Per son, William Williamson; patient is currently on waiting list for [facility] facility.  Patient was seen by PT and OT with recommendations of SNF placement.  Discharging to [facility].  Stable for discharge today to SNF level of care .  Discharge Diagnoses:  Principal Problem:   Acute metabolic encephalopathy Active Problems:   CAP (community acquired pneumonia)   Hypothyroidism following radioiodine therapy   Parkinson disease    Discharge Instructions  Discharge Instructions     Call MD for:  extreme fatigue   Complete by: As directed    Call MD for:  persistant dizziness or light-headedness   Complete by: As directed    Call MD for:  persistant nausea and vomiting   Complete by: As directed    Call MD for:  redness, tenderness, or signs of infection (pain, swelling, redness, odor or green/yellow discharge around incision site)   Complete by: As directed    Call MD for:  temperature >100.4   Complete by: As directed    Diet general   Complete by: As directed    DIET DYS 2; Fluid consistency: Nectar Thick   Increase activity slowly   Complete by: As directed       Allergies as of 05/11/2022       Reactions   Garlic Nausea Only   Niacin    REACTION: flushing        Medication List     STOP taking these medications    furosemide 20 MG tablet Commonly known as: LASIX   Myrbetriq 50 MG Tb24 tablet Generic drug: mirabegron  ER       TAKE these medications    amLODipine 10 MG tablet Commonly known as: NORVASC Take 1 tablet (10 mg total) by mouth daily.   carbidopa-levodopa 50-200 MG tablet Commonly known as: SINEMET CR Take 1 tablet by mouth at bedtime.   carbidopa-levodopa 25-100 MG tablet Commonly known as: SINEMET IR TAKE 2 TABLETS BY MOUTH AT 7AM 11AM 3PM AND 1 TABLET AT 7PM   Cholecalciferol 25 MCG (1000 UT) Chew Chew 1 tablet (1,000 Units total) by mouth 2 (two) times daily.   levothyroxine 100 MCG tablet Commonly known as: SYNTHROID TAKE 1 TABLET BY MOUTH ONCE DAILY BEFORE BREAKFAST   losartan 25 MG tablet Commonly known as: COZAAR Take 1 tablet (25 mg total) by mouth daily.   melatonin 3 MG Tabs tablet Take 1 tablet (3 mg total) by mouth at bedtime.   mirtazapine 7.5 MG tablet Commonly known as: REMERON Take 1 tablet (7.5 mg total) by mouth at bedtime.   NONFORMULARY OR COMPOUNDED ITEM Walker  #1   Dx parkinson disease   polyethylene glycol 17 g packet Commonly known as: MIRALAX / GLYCOLAX Take 17 g by mouth daily as needed for mild constipation.   QUEtiapine 25 MG tablet Commonly known as: SEROQUEL Take 1 tablet (25 mg total) by mouth at bedtime. What changed:  how much to take when to take this   senna-docusate 8.6-50 MG tablet Commonly known as: Senokot-S Take 1 tablet by mouth 2 (two) times daily.   simvastatin 20 MG tablet Commonly known as: ZOCOR TAKE 1 TABLET BY MOUTH ONCE DAILY IN THE EVENING  Follow-up Information     [name], DO. Schedule an appointment as soon as possible for a visit in 1 week(s).   Specialty: Family Medicine Contact information: [phone] [place] [address] [phone]                Allergies  Allergen Reactions   Garlic Nausea Only   Niacin     REACTION: flushing    Consultations: None   Procedures/Studies: DG Swallowing Func-Speech Pathology  Result Date: 05/04/2022 Table  formatting from the original result was not included. Objective Swallowing Evaluation: Type of Study: MBS-Modified Barium Swallow Study  Patient Details Name: William Williamson MRN: [id number] Date of Birth: 02/02/1933 Today's Date: 05/04/2022 Time: SLP Start Time (ACUTE ONLY): 22 -SLP Stop Time (ACUTE ONLY): 1331 SLP Time Calculation (min) (ACUTE ONLY): 21 min Past Medical History: Past Medical History: Diagnosis Date  Anemia 1952  post Oromycin for [place]mia  Anisocoria   post op  Arthritis   Autonomic dysfunction   GERD (gastroesophageal reflux disease)   Glaucoma   Dr Bing Plume  Hearing loss in right ear   Hypercholesteremia   Hyperthyroidism   s/p RAI  LVH (left ventricular hypertrophy)   Orthostatic hypotension   Parkinson's disease  Past Surgical History: Past Surgical History: Procedure Laterality Date  CARDIAC CATHETERIZATION  07/2006  Dr.  Eustace Quail, negative  CATARACT EXTRACTION, BILATERAL    OS retinal surgery for post op floaters  COLONOSCOPY    negative X 3  RAI ablation  02/2008  hyperthyroidism  SEPTOPLASTY  1970  VASECTOMY   HPI: 89 y.o. male adm 2/1 with cough, congestion, cough productive of mucus. Increased confusion, urinary incontinence, hallucinations over past couple of days. CT showed R middle lobe consolidation and numerous solid nodules and patchy consolidations which are most pronounced in the bilateral lower lobes and R middle lobes. Findings likely due to aspiration or infection. Pt had an OP MBS in 2019 that showed mild oral and moderate pharyngeal dysphagia that included "impaired pharyngeal contraction, tongue base retraction and laryngeal elevation results in residuals across all consistencies." He did not aspirate or frankly penetrate but various postures did not reduce residue. He was recommended to have OP SLP but pt says he did not pursue this. PMH: Parkinson's disease, GERD, hearing loss R ear, glaucoma, hyperthyroidism  Subjective: denies trouble swallowing  Recommendations for  follow up therapy are one component of a multi-disciplinary discharge planning process, led by the attending physician.  Recommendations may be updated based on patient status, additional functional criteria and insurance authorization. Assessment / Plan / Recommendation   05/04/2022   2:00 PM Clinical Impressions Clinical Impression Pt has what appears to be a mild progression of his dysphagia since last MBS in 2019, still with pharyngeal residuals across all consistencies but not aspirating on thin liquid residue. Premature spillage is noted with thin liquids, but overall there is some limited visibility of the oral phase throughout testing. Pharyngeally there is reduced base of tongue retraction, pharyngeal squeeze, and epiglottic inversion. Residue is noted througout the valleculae and lateral channels down through the pyriform sinuses. When he opens his airway back up after swallowing or in between boluses, thin liquid residue spills into his open aiway and results in silent aspiration. Despite increasing residuals, there is no other airway invasion with thicker consistencies. He performs multiple swallow to gradually reduce residue present. Recommend starting with Dys 2 diet and nectar thick liquids. Will f/u for trial of dysphagia therapy given  suspicion for acute on chronic deficits. SLP Visit Diagnosis Dysphagia, pharyngeal phase (R13.13) Impact on safety and function Moderate aspiration risk;Risk for inadequate nutrition/hydration     05/04/2022   2:00 PM Treatment Recommendations Treatment Recommendations Therapy as outlined in treatment plan below     05/04/2022   2:00 PM Prognosis Prognosis for Safe Diet Advancement Good Barriers to Reach Goals Cognitive deficits;Time post onset   05/04/2022   2:00 PM Diet Recommendations SLP Diet Recommendations Dysphagia 2 (Fine chop) solids;Nectar thick liquid Liquid Administration via Cup;Straw Medication Administration Crushed with puree Compensations Minimize  environmental distractions;Slow rate;Small sips/bites Postural Changes Seated upright at 90 degrees;Remain semi-upright after after feeds/meals (Comment)     05/04/2022   2:00 PM Other Recommendations Oral Care Recommendations Oral care BID Other Recommendations Order thickener from pharmacy;Prohibited food (jello, ice cream, thin soups);Remove water pitcher Follow Up Recommendations Skilled nursing-short term rehab (<3 hours/day) Functional Status Assessment Patient has had a recent decline in their functional status and demonstrates the ability to make significant improvements in function in a reasonable and predictable amount of time.   05/04/2022   2:00 PM Frequency and Duration  Speech Therapy Frequency (ACUTE ONLY) min 2x/week Treatment Duration 2 weeks     05/04/2022   2:00 PM Oral Phase Oral Phase Impaired Oral - Thin Teaspoon Premature spillage Oral - Thin Cup Premature spillage    05/04/2022   2:00 PM Pharyngeal Phase Pharyngeal Phase Impaired Pharyngeal- Honey Teaspoon Reduced epiglottic inversion;Reduced tongue base retraction;Reduced pharyngeal peristalsis;Pharyngeal residue - valleculae;Pharyngeal residue - pyriform;Lateral channel residue Pharyngeal- Nectar Teaspoon Reduced epiglottic inversion;Reduced tongue base retraction;Reduced pharyngeal peristalsis;Pharyngeal residue - valleculae;Pharyngeal residue - pyriform;Lateral channel residue Pharyngeal- Nectar Cup Reduced epiglottic inversion;Reduced tongue base retraction;Reduced pharyngeal peristalsis;Pharyngeal residue - valleculae;Pharyngeal residue - pyriform;Lateral channel residue Pharyngeal- Thin Teaspoon Reduced epiglottic inversion;Reduced tongue base retraction;Reduced pharyngeal peristalsis;Pharyngeal residue - valleculae;Pharyngeal residue - pyriform;Lateral channel residue Pharyngeal- Thin Cup Reduced epiglottic inversion;Reduced tongue base retraction;Reduced pharyngeal peristalsis;Pharyngeal residue - valleculae;Pharyngeal residue -  pyriform;Lateral channel residue;Penetration/Apiration after swallow Pharyngeal Material enters airway, passes BELOW cords without attempt by patient to eject out (silent aspiration) Pharyngeal- Puree Reduced epiglottic inversion;Reduced tongue base retraction;Reduced pharyngeal peristalsis;Pharyngeal residue - valleculae;Pharyngeal residue - pyriform;Lateral channel residue Pharyngeal- Regular Reduced epiglottic inversion;Reduced tongue base retraction;Reduced pharyngeal peristalsis;Pharyngeal residue - valleculae;Pharyngeal residue - pyriform;Lateral channel residue    03/05/2018   3:00 PM Cervical Esophageal Phase  Cervical Esophageal Phase Impaired Cervical Esophageal Comment decreased clearance into esophagus resulting in pyriform sinus residuals Osie Bond., M.A. CCC-SLP Acute Rehabilitation Services Office [phone] Secure chat preferred 05/04/2022, 2:32 PM                     CT Chest W Contrast  Result Date: 05/03/2022 CLINICAL DATA:  Pneumonia suspected EXAM: CT CHEST WITH CONTRAST TECHNIQUE: Multidetector CT imaging of the chest was performed during intravenous contrast administration. RADIATION DOSE REDUCTION: This exam was performed according to the departmental dose-optimization program which includes automated exposure control, adjustment of the mA and/or kV according to patient size and/or use of iterative reconstruction technique. CONTRAST:  61m OMNIPAQUE IOHEXOL 300 MG/ML  SOLN COMPARISON:  Chest CT dated Aug 16, 2006 FINDINGS: Cardiovascular: Normal heart size. No pericardial effusion. Normal caliber thoracic aorta with moderate atherosclerotic disease. Severe coronary artery calcifications. Mediastinum/Nodes: Patulous esophagus. Thyroid is unremarkable. Mildly enlarged AP window lymph node measuring 10 mm in short axis on series 7, image 72, likely reactive. No pathologically enlarged lymph nodes seen in the chest Lungs/Pleura: Central airways are  patent. Right middle lobe consolidation and  numerous solid nodules and patchy consolidations which are most pronounced pronounced in the bilateral lower lobe and right middle lobe. No pleural effusion pneumothorax. Upper Abdomen: No acute abnormality. Musculoskeletal: No chest wall abnormality. No acute or significant osseous findings. IMPRESSION: 1. Right middle lobe consolidation and numerous solid nodules and patchy consolidations which are most pronounced in the bilateral lower lobes and right middle lobe, findings are likely due to aspiration or infection. Recommend follow-up chest CT in 3 months to ensure resolution. 2. Mildly enlarged AP window lymph node which is likely reactive. Recommend attention on follow-up 3. Aortic Atherosclerosis (ICD10-I70.0). Electronically Signed   By: Yetta Glassman M.D.   On: 05/03/2022 16:31   DG Chest 2 View  Result Date: 05/03/2022 CLINICAL DATA:  Cough, altered mental status. EXAM: CHEST - 2 VIEW COMPARISON:  None Available. FINDINGS: Well-expanded lungs. Patchy opacities in the lower lobes seen only on the lateral view may represent atelectasis, aspiration or scarring. Normal heart size. Atherosclerotic calcifications of the aortic arch and tortuosity of the descending thoracic aorta. No pleural effusion or pneumothorax. Visualized bones and upper abdomen are unremarkable. IMPRESSION: Patchy opacities in the lower lobes seen only on the lateral view may represent atelectasis, scarring or aspiration. Electronically Signed   By: Emmit Alexanders M.D.   On: 05/03/2022 14:49   CT Head Wo Contrast  Result Date: 05/03/2022 CLINICAL DATA:  Mental status change, unknown cause EXAM: CT HEAD WITHOUT CONTRAST TECHNIQUE: Contiguous axial images were obtained from the base of the skull through the vertex without intravenous contrast. RADIATION DOSE REDUCTION: This exam was performed according to the departmental dose-optimization program which includes automated exposure control, adjustment of the mA and/or kV according to  patient size and/or use of iterative reconstruction technique. COMPARISON:  None Available. FINDINGS: Brain: There is periventricular white matter decreased attenuation consistent with small vessel ischemic changes. Ventricles, sulci and cisterns are prominent consistent with age related involutional changes. No acute intracranial hemorrhage, mass effect or shift. No hydrocephalus. Vascular: No hyperdense vessel or unexpected calcification. Skull: Normal. Negative for fracture or focal lesion. Sinuses/Orbits: Mucoperiosteal thickening consistent with chronic maxillary, ethmoid and frontal sinusitis. No acute finding. IMPRESSION: Atrophy and chronic small vessel ischemic changes. No acute intracranial process identified. Electronically Signed   By: Sammie Bench M.D.   On: 05/03/2022 14:08     Subjective: Seen and examined. No specific complaints . He is comfortable with plans for discharge today . Ultimate plan is to go to ALF  Discharge Exam: Vitals:   05/11/22 0427 05/11/22 0925  BP: (!) 150/74 123/60  Pulse: 99 70  Resp: 18 18  Temp: 98.1 F (36.7 C) 98 F (36.7 C)  SpO2: 93%    Vitals:   05/10/22 1634 05/10/22 2036 05/11/22 0427 05/11/22 0925  BP: (!) 141/82 (!) 163/133 (!) 150/74 123/60  Pulse: 66 (!) 103 99 70  Resp: 18 18 18 18  $ Temp: 98.4 F (36.9 C) 98 F (36.7 C) 98.1 F (36.7 C) 98 F (36.7 C)  TempSrc: Oral Oral Oral Oral  SpO2: 92% 92% 93%   Weight:      Height:        Physical Exam: Looks comfortable.  Thin and frail, debilitated. On room air . Slight resting tremors  Slow to respond, quiet and calm.   The results of significant diagnostics from this hospitalization (including imaging, microbiology, ancillary and laboratory) are listed below for reference.     Microbiology: Recent  Results (from the past 240 hour(s))  Resp panel by RT-PCR (RSV, Flu A&B, Covid) Anterior Nasal Swab     Status: None   Collection Time: 05/03/22  2:15 PM   Specimen: Anterior  Nasal Swab  Result Value Ref Range Status   SARS Coronavirus 2 by RT PCR NEGATIVE NEGATIVE Final    Comment: (NOTE) SARS-CoV-2 target nucleic acids are NOT DETECTED.  The SARS-CoV-2 RNA is generally detectable in upper respiratory specimens during the acute phase of infection. The lowest concentration of SARS-CoV-2 viral copies this assay can detect is 138 copies/mL. A negative result does not preclude SARS-Cov-2 infection and should not be used as the sole basis for treatment or other patient management decisions. A negative result may occur with  improper specimen collection/handling, submission of specimen other than nasopharyngeal swab, presence of viral mutation(s) within the areas targeted by this assay, and inadequate number of viral copies(<138 copies/mL). A negative result must be combined with clinical observations, patient history, and epidemiological information. The expected result is Negative.  Fact Sheet for Patients:  EntrepreneurPulse.com.au  Fact Sheet for Healthcare Providers:  IncredibleEmployment.be  This test is no t yet approved or cleared by the [place] FDA and  has been authorized for detection and/or diagnosis of SARS-CoV-2 by FDA under an Emergency Use Authorization (EUA). This EUA will remain  in effect (meaning this test can be used) for the duration of the COVID-19 declaration under Section 564(b)(1) of the Act, 21 U.S.C.section 360bbb-3(b)(1), unless the authorization is terminated  or revoked sooner.       Influenza A by PCR NEGATIVE NEGATIVE Final   Influenza B by PCR NEGATIVE NEGATIVE Final    Comment: (NOTE) The Xpert Xpress SARS-CoV-2/FLU/RSV plus assay is intended as an aid in the diagnosis of influenza from Nasopharyngeal swab specimens and should not be used as a sole basis for treatment. Nasal washings and aspirates are unacceptable for Xpert Xpress SARS-CoV-2/FLU/RSV testing.  Fact Sheet for  Patients: EntrepreneurPulse.com.au  Fact Sheet for Healthcare Providers: IncredibleEmployment.be  This test is not yet approved or cleared by the [place] FDA and has been authorized for detection and/or diagnosis of SARS-CoV-2 by FDA under an Emergency Use Authorization (EUA). This EUA will remain in effect (meaning this test can be used) for the duration of the COVID-19 declaration under Section 564(b)(1) of the Act, 21 U.S.C. section 360bbb-3(b)(1), unless the authorization is terminated or revoked.     Resp Syncytial Virus by PCR NEGATIVE NEGATIVE Final    Comment: (NOTE) Fact Sheet for Patients: EntrepreneurPulse.com.au  Fact Sheet for Healthcare Providers: IncredibleEmployment.be  This test is not yet approved or cleared by the [place] FDA and has been authorized for detection and/or diagnosis of SARS-CoV-2 by FDA under an Emergency Use Authorization (EUA). This EUA will remain in effect (meaning this test can be used) for the duration of the COVID-19 declaration under Section 564(b)(1) of the Act, 21 U.S.C. section 360bbb-3(b)(1), unless the authorization is terminated or revoked.  Performed at [facility], [address] [place], [place] [address]      Labs: BNP (last 3 results) No results for input(s): "BNP" in the last 8760 hours. Basic Metabolic Panel: Recent Labs  Lab 05/05/22 0440 05/07/22 0201 05/11/22 0116  NA 140 138 136  K 3.5 3.8 3.6  CL 107 103 100  CO2 25 27 26  $ GLUCOSE 95 98 92  BUN 14 10 16  $ CREATININE 1.06 1.03 0.88  CALCIUM 8.2* 8.9 8.7*  MG 2.0 1.9 2.0   Liver Function Tests: No results for input(s): "AST", "ALT", "ALKPHOS", "BILITOT", "PROT", "ALBUMIN" in the last 168 hours.  No results for input(s): "LIPASE", "AMYLASE" in the last 168 hours. No results for input(s): "AMMONIA" in the last 168 hours. CBC: Recent Labs  Lab 05/05/22 0440  05/11/22 0116  WBC 5.1 6.3  HGB 10.6* 11.1*  HCT 31.3* 33.3*  MCV 94.0 93.8  PLT 160 222   Cardiac Enzymes: No results for input(s): "CKTOTAL", "CKMB", "CKMBINDEX", "TROPONINI" in the last 168 hours. BNP: Invalid input(s): "POCBNP" CBG: Recent Labs  Lab 05/07/22 1649  GLUCAP 99   D-Dimer No results for input(s): "DDIMER" in the last 72 hours. Hgb A1c No results for input(s): "HGBA1C" in the last 72 hours. Lipid Profile No results for input(s): "CHOL", "HDL", "LDLCALC", "TRIG", "CHOLHDL", "LDLDIRECT" in the last 72 hours. Thyroid function studies No results for input(s): "TSH", "T4TOTAL", "T3FREE", "THYROIDAB" in the last 72 hours.  Invalid input(s): "FREET3" Anemia work up No results for input(s): "VITAMINB12", "FOLATE", "FERRITIN", "TIBC", "IRON", "RETICCTPCT" in the last 72 hours. Urinalysis    Component Value Date/Time   COLORURINE YELLOW 05/03/2022 [place] 05/03/2022 1222   LABSPEC 1.010 05/03/2022 1222   PHURINE 5.5 05/03/2022 1222   GLUCOSEU NEGATIVE 05/03/2022 1222   HGBUR NEGATIVE 05/03/2022 1222   HGBUR negative 06/02/2009 0912   BILIRUBINUR NEGATIVE 05/03/2022 1222   BILIRUBINUR negative 05/03/2022 1219   KETONESUR NEGATIVE 05/03/2022 1222   PROTEINUR NEGATIVE 05/03/2022 1222   PROTEINUR Negative 05/03/2022 1219   UROBILINOGEN 0.2 05/03/2022 1219   UROBILINOGEN 0.2 06/02/2009 0912   NITRITE NEGATIVE 05/03/2022 1222   NITRITE negative 05/03/2022 1219   LEUKOCYTESUR NEGATIVE 05/03/2022 1222   LEUKOCYTESUR Negative 05/03/2022 1219   Sepsis Labs Recent Labs  Lab 05/05/22 0440 05/11/22 0116  WBC 5.1 6.3   Microbiology Recent Results (from the past 240 hour(s))  Resp panel by RT-PCR (RSV, Flu A&B, Covid) Anterior Nasal Swab     Status: None   Collection Time: 05/03/22  2:15 PM   Specimen: Anterior Nasal Swab  Result Value Ref Range Status   SARS Coronavirus 2 by RT PCR NEGATIVE NEGATIVE Final    Comment: (NOTE) SARS-CoV-2 target  nucleic acids are NOT DETECTED.  The SARS-CoV-2 RNA is generally detectable in upper respiratory specimens during the acute phase of infection. The lowest concentration of SARS-CoV-2 viral copies this assay can detect is 138 copies/mL. A negative result does not preclude SARS-Cov-2 infection and should not be used as the sole basis for treatment or other patient management decisions. A negative result may occur with  improper specimen collection/handling, submission of specimen other than nasopharyngeal swab, presence of viral mutation(s) within the areas targeted by this assay, and inadequate number of viral copies(<138 copies/mL). A negative result must be combined with clinical observations, patient history, and epidemiological information. The expected result is Negative.  Fact Sheet for Patients:  EntrepreneurPulse.com.au  Fact Sheet for Healthcare Providers:  IncredibleEmployment.be  This test is no t yet approved or cleared by the [place] FDA and  has been authorized for detection and/or diagnosis of SARS-CoV-2 by FDA under an Emergency Use Authorization (EUA). This EUA will remain  in effect (meaning this test can be used) for the duration of the COVID-19 declaration under Section 564(b)(1) of the Act, 21 U.S.C.section 360bbb-3(b)(1), unless the authorization is terminated  or revoked sooner.       Influenza A by PCR NEGATIVE NEGATIVE Final  Influenza B by PCR NEGATIVE NEGATIVE Final    Comment: (NOTE) The Xpert Xpress SARS-CoV-2/FLU/RSV plus assay is intended as an aid in the diagnosis of influenza from Nasopharyngeal swab specimens and should not be used as a sole basis for treatment. Nasal washings and aspirates are unacceptable for Xpert Xpress SARS-CoV-2/FLU/RSV testing.  Fact Sheet for Patients: EntrepreneurPulse.com.au  Fact Sheet for Healthcare  Providers: IncredibleEmployment.be  This test is not yet approved or cleared by the [place] FDA and has been authorized for detection and/or diagnosis of SARS-CoV-2 by FDA under an Emergency Use Authorization (EUA). This EUA will remain in effect (meaning this test can be used) for the duration of the COVID-19 declaration under Section 564(b)(1) of the Act, 21 U.S.C. section 360bbb-3(b)(1), unless the authorization is terminated or revoked.     Resp Syncytial Virus by PCR NEGATIVE NEGATIVE Final    Comment: (NOTE) Fact Sheet for Patients: EntrepreneurPulse.com.au  Fact Sheet for Healthcare Providers: IncredibleEmployment.be  This test is not yet approved or cleared by the [place] FDA and has been authorized for detection and/or diagnosis of SARS-CoV-2 by FDA under an Emergency Use Authorization (EUA). This EUA will remain in effect (meaning this test can be used) for the duration of the COVID-19 declaration under Section 564(b)(1) of the Act, 21 U.S.C. section 360bbb-3(b)(1), unless the authorization is terminated or revoked.  Performed at [facility], [address] [place], [place] [address]      Time coordinating discharge: 28 minutes   SIGNED:   Barb Merino, MD [facility] 05/11/2022, 10:15 AM

## 2022-05-09 NOTE — Progress Notes (Signed)
Patients guardian was called and informed that his dad is leaving for a SNF.

## 2022-05-09 NOTE — TOC Progression Note (Addendum)
Transition of Care [facility]) - Initial/Assessment Note    Patient Details  Name: William Williamson MRN: [id number] Date of Birth: 1932-06-09  [facility]) CM/SW Contact:    Milinda Antis, LCSWA Phone Number: 05/09/2022, 1:52 PM  Clinical Narrative:                 [place] rescinded bed offer.  Patient does not have any bed offers at this time.  LCSW contacted patient's son and informed of the above information.  Son is agreeable to [facility] faxing the referral out to facilities in [place] and [place].  LCSW contacted Christine with [facility].  The facility is reviewing and will inform LCSW of the response.     LCSW contacted admissions at [facility] in [place], [place].  The facility is reviewing the referral and will inform LCSW of the determination.    TOC following.   Patient Goals and CMS Choice            Expected Discharge Plan and Services         Expected Discharge Date: 05/09/22                                    Prior Living Arrangements/Services                       Activities of Daily Living Home Assistive Devices/Equipment: Gilford Rile (specify type), Wheelchair, Cane (specify quad or straight) ADL Screening (condition at time of admission) Patient's cognitive ability adequate to safely complete daily activities?: No Is the patient deaf or have difficulty hearing?: No Does the patient have difficulty seeing, even when wearing glasses/contacts?: No Does the patient have difficulty concentrating, remembering, or making decisions?: Yes Patient able to express need for assistance with ADLs?: Yes Does the patient have difficulty dressing or bathing?: Yes Independently performs ADLs?: No Communication: Independent Dressing (OT): Needs assistance Is this a change from baseline?: Pre-admission baseline Grooming: Independent Feeding: Needs assistance Is this a change from baseline?:  Pre-admission baseline Bathing: Needs assistance Is this a change from baseline?: Pre-admission baseline Toileting: Needs assistance Is this a change from baseline?: Pre-admission baseline In/Out Bed: Needs assistance Is this a change from baseline?: Pre-admission baseline Walks in Home: Needs assistance Is this a change from baseline?: Pre-admission baseline Does the patient have difficulty walking or climbing stairs?: Yes Weakness of Legs: Both Weakness of Arms/Hands: None  Permission Sought/Granted                  Emotional Assessment              Admission diagnosis:  Transient alteration of awareness [X10.6] Acute metabolic encephalopathy [Y69.48] Multifocal pneumonia [J18.9] Patient Active Problem List   Diagnosis Date Noted   Acute metabolic encephalopathy 54/62/7035   CAP (community acquired pneumonia) 05/03/2022   Loose stools 03/16/2021   Low back pain radiating to left leg 09/01/2020   Frequent falls 08/12/2020   Depression 05/14/2020   Insomnia 04/12/2020   Hallucinations 04/12/2020   Left elbow pain 04/12/2020   Left wrist pain 04/12/2020   Urinary incontinence 04/12/2020   Benign prostatic hyperplasia with urinary frequency 03/03/2019   Tinea corporis 03/03/2019   Elevated BP without diagnosis of hypertension 12/30/2018   Lower extremity edema 09/09/2018   Otalgia 08/26/2018   Preventative health care 08/16/2016   Hypothyroidism  08/16/2016   Prostate enlargement 08/16/2016   Autonomic dysfunction 02/02/2016   Ingrown toenail 05/17/2015   Right thigh pain 12/24/2014   H. pylori infection 06/21/2014   Primary hypertension 02/08/2014   Hyperlipidemia 02/08/2014   Parkinson disease 12/21/2013   Hearing loss 10/06/2013   Paralysis agitans 07/28/2013   UTI (urinary tract infection) 05/27/2013   Nonspecific abnormal electrocardiogram (ECG) (EKG) 06/14/2011   Anemia, unspecified 06/13/2010   CONSTIPATION, CHRONIC 06/09/2009   Hypothyroidism  following radioiodine therapy 07/07/2008   Vitamin D deficiency 01/09/2008   HYPERCHOLESTEROLEMIA 07/10/2007   Arthropathy 07/10/2007   G E R D 10/11/2006   PCP:  [name], DO Pharmacy:   [place], [place] - [place] [address] [place] [address] Phone: [phone] Fax: [phone]     Social Determinants of Health ([place]) Social History: SDOH Screenings   Food Insecurity: Unknown (05/03/2022)  Housing: Low Risk  (05/03/2022)  Transportation Needs: No Transportation Needs (05/03/2022)  Utilities: Not At Risk (05/03/2022)  Alcohol Screen: Low Risk  (01/23/2021)  Depression (PHQ2-9): Low Risk  (02/14/2022)  Financial Resource Strain: Low Risk  (02/14/2022)  Physical Activity: Inactive (02/14/2022)  Social Connections: Socially Isolated (02/14/2022)  Stress: No Stress Concern Present (02/14/2022)  Tobacco Use: Medium Risk (05/03/2022)   SDOH Interventions:     Readmission Risk Interventions     No data to display

## 2022-05-10 DIAGNOSIS — G9341 Metabolic encephalopathy: Secondary | ICD-10-CM | POA: Diagnosis not present

## 2022-05-10 MED ORDER — LOSARTAN POTASSIUM 25 MG PO TABS
25.0000 mg | ORAL_TABLET | Freq: Every day | ORAL | Status: DC
  Administered 2022-05-10 – 2022-05-11 (×2): 25 mg via ORAL
  Filled 2022-05-10 (×2): qty 1

## 2022-05-10 MED ORDER — LOSARTAN POTASSIUM 25 MG PO TABS: 25.0000 mg | ORAL_TABLET | Freq: Every day | ORAL | Status: DC

## 2022-05-10 NOTE — TOC Progression Note (Addendum)
Transition of Care [facility]) - Initial/Assessment Note    Patient Details  Name: William Williamson MRN: [id number] Date of Birth: 07-21-1932  Transition of Care [facility]) CM/SW Contact:    Milinda Antis, LCSWA Phone Number: 05/10/2022, 8:42 AM  Clinical Narrative:                 LCSW contacted Christine with [facility] to inquire about the facility extending a bed offer.  There was no answer.  LCSW left a secure VM.   LCSW contacted admissions at [facility] in [place], [place].  There was no answer.  LCSW left a secure voicemail inquiring about the status of the SNF referral.   1400-  LCSW contacted [facility] again to receive determination.  The facility will extend a bed offer and can accept the patient tomorrow.    LCSW contacted [facility] [employer] and changed the facility name.  Insurance Josem Kaufmann remains approved.    LCSW contacted the patient's son and provided update.  TOC following.      Barriers to Discharge: Barriers Resolved   Patient Goals and CMS Choice            Expected Discharge Plan and Services         Expected Discharge Date: 05/09/22                                    Prior Living Arrangements/Services                       Activities of Daily Living Home Assistive Devices/Equipment: Gilford Rile (specify type), Wheelchair, Radio producer (specify quad or straight) ADL Screening (condition at time of admission) Patient's cognitive ability adequate to safely complete daily activities?: No Is the patient deaf or have difficulty hearing?: No Does the patient have difficulty seeing, even when wearing glasses/contacts?: No Does the patient have difficulty concentrating, remembering, or making decisions?: Yes Patient able to express need for assistance with ADLs?: Yes Does the patient have difficulty dressing or bathing?: Yes Independently performs ADLs?: No Communication: Independent Dressing (OT): Needs assistance Is this a change  from baseline?: Pre-admission baseline Grooming: Independent Feeding: Needs assistance Is this a change from baseline?: Pre-admission baseline Bathing: Needs assistance Is this a change from baseline?: Pre-admission baseline Toileting: Needs assistance Is this a change from baseline?: Pre-admission baseline In/Out Bed: Needs assistance Is this a change from baseline?: Pre-admission baseline Walks in Home: Needs assistance Is this a change from baseline?: Pre-admission baseline Does the patient have difficulty walking or climbing stairs?: Yes Weakness of Legs: Both Weakness of Arms/Hands: None  Permission Sought/Granted                  Emotional Assessment              Admission diagnosis:  Transient alteration of awareness [Y50.3] Acute metabolic encephalopathy [T46.56] Multifocal pneumonia [J18.9] Patient Active Problem List   Diagnosis Date Noted   Acute metabolic encephalopathy 81/27/5170   CAP (community acquired pneumonia) 05/03/2022   Loose stools 03/16/2021   Low back pain radiating to left leg 09/01/2020   Frequent falls 08/12/2020   Depression 05/14/2020   Insomnia 04/12/2020   Hallucinations 04/12/2020   Left elbow pain 04/12/2020   Left wrist pain 04/12/2020   Urinary incontinence 04/12/2020   Benign prostatic hyperplasia with urinary frequency 03/03/2019   Tinea corporis 03/03/2019   Elevated  BP without diagnosis of hypertension 12/30/2018   Lower extremity edema 09/09/2018   Otalgia 08/26/2018   Preventative health care 08/16/2016   Hypothyroidism 08/16/2016   Prostate enlargement 08/16/2016   Autonomic dysfunction 02/02/2016   Ingrown toenail 05/17/2015   Right thigh pain 12/24/2014   H. pylori infection 06/21/2014   Primary hypertension 02/08/2014   Hyperlipidemia 02/08/2014   Parkinson disease 12/21/2013   Hearing loss 10/06/2013   Paralysis agitans 07/28/2013   UTI (urinary tract infection) 05/27/2013   Nonspecific abnormal  electrocardiogram (ECG) (EKG) 06/14/2011   Anemia, unspecified 06/13/2010   CONSTIPATION, CHRONIC 06/09/2009   Hypothyroidism following radioiodine therapy 07/07/2008   Vitamin D deficiency 01/09/2008   HYPERCHOLESTEROLEMIA 07/10/2007   Arthropathy 07/10/2007   G E R D 10/11/2006   PCP:  [name], DO Pharmacy:   [place], [place] - [place] [address] Phone: [phone] Fax: [phone]     Social Determinants of Health ([place]) Social History: SDOH Screenings   Food Insecurity: Unknown (05/03/2022)  Housing: Low Risk  (05/03/2022)  Transportation Needs: No Transportation Needs (05/03/2022)  Utilities: Not At Risk (05/03/2022)  Alcohol Screen: Low Risk  (01/23/2021)  Depression (PHQ2-9): Low Risk  (02/14/2022)  Financial Resource Strain: Low Risk  (02/14/2022)  Physical Activity: Inactive (02/14/2022)  Social Connections: Socially Isolated (02/14/2022)  Stress: No Stress Concern Present (02/14/2022)  Tobacco Use: Medium Risk (05/03/2022)   SDOH Interventions:     Readmission Risk Interventions     No data to display

## 2022-05-10 NOTE — Progress Notes (Addendum)
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  Patient also during this timeframe has had a productive cough, congestion.  Per son, no overt aspiration episodes noted and no history of aspiration previously.  In the ED, temperature 97.7 F, HR 62, RR 18, BP 161/79, SpO2 96% on room air.  Sodium 134, potassium 4.1, chloride 100, CO2 28, glucose 99, BUN 29, creatinine 1.32.  WBC 8.2, hemoglobin 11.4, platelets 155.  Lactic acid 1.1.  Urinalysis unrevealing.  UDS negative.  EtOH level less than 10.  COVID/RSV/influenza PCR negative.  CT head without contrast with atrophy and chronic small vessel ischemic changes, no acute intracranial process identified.  CT chest without contrast with right middle lobe consolidation numerous solid nodules and patchy consolidations more pronounced in the bilateral lower lobes and right middle lobe likely due to aspiration versus infection, aortic atherosclerosis.  Assessment & Plan:   Acute metabolic encephalopathy Patient presenting to ED with increased confusion, hallucinations over the past several days.  UDS negative, EtOH level less than 10. Recent vitamin D and vitamin B12 level on 02/01/2022 within normal limits.  Was recently seen by his neurologist, Dr. Carles Collet on 1/25 with increase of Seroquel to 12.5 mg p.o. twice daily.  Concern for likely infectious etiology given CT chest concerning for pneumonia.  CT head with no acute findings.  COVID/RSV/influenza PCR negative.  Complicated by his history of underlying Parkinson's dementia. -- Continue treatment as below, supportive care  Community-acquired  pneumonia Patient presenting with confusion, productive cough.  CT chest without contrast with right middle lobe consolidation with numerous solid nodules and patchy consolidations concerning for aspiration versus infection.  Completed 5-day course of azithromycin and 7-day course of ceftriaxone. -- Mucinex 600 mg PO BID -- Incentive spirometry/flutter valve -- Supportive care  Parkinson's disease/dementia Follows with neurology outpatient, Dr. Carles Collet; last seen in clinic on 1/25. -- Carbidopa/levodopa 2 tablets 7am/11am/3pm and 1 tablet at 7pm -- Carbidopa/levodopa 50/200 CR at bedtime -- Seroquel '25mg'$  PO qHS -- Melatonin 3 mg p.o. nightly -- Supportive care, fall precautions  Dysphagia Evaluated by speech therapy. Dysphagia 2 (Fine chop) solids;Nectar thick liquid Liquid Administration via: Cup;Straw Medication Administration: Crushed with puree Supervision: Intermittent supervision to cue for compensatory strategies;Comment (set-up assistance) Compensations: Minimize environmental distractions;Slow rate;Small sips/bites Postural Changes: Seated upright at 90 degrees;Remain semi-upright after after feeds/meals (Comment) Oral Care Recommendations: Oral care BID Other Recommendations: Order thickener from pharmacy;Prohibited food (jello, ice cream, thin soups);Remove water pitcher  Hypothyroidism TSH 1.78 on 02/01/2022. -- Levothyroxine 100 mcg p.o. daily  Hyperlipidemia -- Simvastatin 20 mg p.o. daily  HTN Previously was on lisinopril but was discontinued due to fluctuating blood pressures.  Blood pressure elevated up to 192/90 on 2/4. -- Amlodipine 10 mg p.o. daily -- Started losartan 25 mg p.o. daily today -- Continue monitor BP closely  Weakness/debility/gait disturbance: Per son, [name]; patient is currently on waiting list for [facility] facility. -- PT evaluation: Recommends SNF placement -- TOC consulted; continues with difficult placement   DVT prophylaxis:  enoxaparin (LOVENOX) injection 40 mg Start: 05/04/22 2000    Code Status: Full Code Family Communication: No family present at bedside this morning  Disposition  Plan:  Level of care: Med-Surg .Status is: Inpatient Remains inpatient appropriate because: Medically stable for discharge once SNF bed available  Consultants:  None  Procedures:  None  Antimicrobials:  Azithromycin 2/1 - 2/6 Ceftriaxone 2/1 - 2/7   Subjective: Patient seen examined bedside, resting comfortably.  Lying in bed.  States "I will do anything if you get me out of here now".  Discussed with patient still awaiting SNF bed given his significant generalized weakness which is a acute decline in his typical baseline.  No family present at bedside this morning.  Discussed with social work this a.m.  S[facility] bed from [facility] was rescinded yesterday by their facility.  No other questions or concerns at this time.  Denies headache, no visual changes, no chest pain, no palpitations, no abdominal pain, no fever/chills/night sweats, no nausea/vomiting/diarrhea, no focal weakness, no paresthesias.  No acute events overnight per nursing staff.    Addendum: 12 Social work informs that we will be able to discharge tomorrow to [facility].  Objective: Vitals:   05/09/22 1710 05/09/22 2031 05/10/22 0533 05/10/22 0859  BP: (!) 153/79 (!) 176/80 (!) 172/83 (!) 168/80  Pulse: 76 72 73 71  Resp: '16 18 17 18  '$ Temp: 98.4 F (36.9 C) 98.8 F (37.1 C) 98 F (36.7 C) 98.2 F (36.8 C)  TempSrc: Oral Oral Oral Oral  SpO2: 93% 96% 96% 98%  Weight:      Height:        Intake/Output Summary (Last 24 hours) at 05/10/2022 1221 Last data filed at 05/10/2022 0900 Gross per 24 hour  Intake 600 ml  Output 2900 ml  Net -2300 ml   Filed Weights   05/03/22 1234 05/03/22 2059 05/06/22 0546  Weight: 77.1 kg 67 kg 66.6 kg    Examination:  Physical Exam: GEN: NAD, alert and oriented x 3 to Place [facility]), Time (2024), and person  (President: Biden), thin in appearance, slight resting tremor noted HEENT: NCAT, PERRL, EOMI, sclera clear, MMM PULM: Breath sounds slight diminished bilateral bases, coarse breath sounds right midlung to base, normal respiratory effort without accessory muscle use, no wheezing, on room air with SpO2 96% at rest. CV: RRR w/o M/G/R GI: abd soft, NTND, NABS, no R/G/M MSK: no peripheral edema, moves extremities independently NEURO: Slight resting tremor noted from underlying Parkinson disease, otherwise no focal deficits appreciated PSYCH: Depressed mood, flat affect Integumentary: dry/intact, no rashes or wounds    Data Reviewed: I have personally reviewed following labs and imaging studies  CBC: Recent Labs  Lab 05/03/22 1307 05/04/22 0649 05/05/22 0440  WBC 8.2 6.1 5.1  HGB 11.4* 10.4* 10.6*  HCT 34.2* 32.0* 31.3*  MCV 94.2 95.2 94.0  PLT 155 152 102   Basic Metabolic Panel: Recent Labs  Lab 05/03/22 1307 05/04/22 0649 05/05/22 0440 05/07/22 0201  NA 134* 138 140 138  K 4.1 3.9 3.5 3.8  CL 100 104 107 103  CO2 '28 26 25 27  '$ GLUCOSE 99 92 95 98  BUN 29* '20 14 10  '$ CREATININE 1.32* 1.17 1.06 1.03  CALCIUM 8.6* 8.3* 8.2* 8.9  MG  --   --  2.0 1.9   GFR: Estimated Creatinine Clearance: 47.6 mL/min (by C-G formula based on SCr of 1.03 mg/dL). Liver Function Tests: Recent Labs  Lab 05/03/22 1307  AST 26  ALT <5  ALKPHOS 51  BILITOT 0.9  PROT 7.0  ALBUMIN 3.5   No results for input(s): "LIPASE", "AMYLASE" in the  last 168 hours. No results for input(s): "AMMONIA" in the last 168 hours. Coagulation Profile: No results for input(s): "INR", "PROTIME" in the last 168 hours. Cardiac Enzymes: No results for input(s): "CKTOTAL", "CKMB", "CKMBINDEX", "TROPONINI" in the last 168 hours. BNP (last 3 results) No results for input(s): "PROBNP" in the last 8760 hours. HbA1C: No results for input(s): "HGBA1C" in the last 72 hours. CBG: Recent Labs  Lab 05/03/22 1339  05/07/22 1649  GLUCAP 75 99   Lipid Profile: No results for input(s): "CHOL", "HDL", "LDLCALC", "TRIG", "CHOLHDL", "LDLDIRECT" in the last 72 hours. Thyroid Function Tests: No results for input(s): "TSH", "T4TOTAL", "FREET4", "T3FREE", "THYROIDAB" in the last 72 hours. Anemia Panel: No results for input(s): "VITAMINB12", "FOLATE", "FERRITIN", "TIBC", "IRON", "RETICCTPCT" in the last 72 hours. Sepsis Labs: Recent Labs  Lab 05/03/22 1307  LATICACIDVEN 1.1    Recent Results (from the past 240 hour(s))  Resp panel by RT-PCR (RSV, Flu A&B, Covid) Anterior Nasal Swab     Status: None   Collection Time: 05/03/22  2:15 PM   Specimen: Anterior Nasal Swab  Result Value Ref Range Status   SARS Coronavirus 2 by RT PCR NEGATIVE NEGATIVE Final    Comment: (NOTE) SARS-CoV-2 target nucleic acids are NOT DETECTED.  The SARS-CoV-2 RNA is generally detectable in upper respiratory specimens during the acute phase of infection. The lowest concentration of SARS-CoV-2 viral copies this assay can detect is 138 copies/mL. A negative result does not preclude SARS-Cov-2 infection and should not be used as the sole basis for treatment or other patient management decisions. A negative result may occur with  improper specimen collection/handling, submission of specimen other than nasopharyngeal swab, presence of viral mutation(s) within the areas targeted by this assay, and inadequate number of viral copies(<138 copies/mL). A negative result must be combined with clinical observations, patient history, and epidemiological information. The expected result is Negative.  Fact Sheet for Patients:  EntrepreneurPulse.com.au  Fact Sheet for Healthcare Providers:  IncredibleEmployment.be  This test is no t yet approved or cleared by the [place] FDA and  has been authorized for detection and/or diagnosis of SARS-CoV-2 by FDA under an Emergency Use Authorization (EUA).  This EUA will remain  in effect (meaning this test can be used) for the duration of the COVID-19 declaration under Section 564(b)(1) of the Act, 21 U.S.C.section 360bbb-3(b)(1), unless the authorization is terminated  or revoked sooner.       Influenza A by PCR NEGATIVE NEGATIVE Final   Influenza B by PCR NEGATIVE NEGATIVE Final    Comment: (NOTE) The Xpert Xpress SARS-CoV-2/FLU/RSV plus assay is intended as an aid in the diagnosis of influenza from Nasopharyngeal swab specimens and should not be used as a sole basis for treatment. Nasal washings and aspirates are unacceptable for Xpert Xpress SARS-CoV-2/FLU/RSV testing.  Fact Sheet for Patients: EntrepreneurPulse.com.au  Fact Sheet for Healthcare Providers: IncredibleEmployment.be  This test is not yet approved or cleared by the [place] FDA and has been authorized for detection and/or diagnosis of SARS-CoV-2 by FDA under an Emergency Use Authorization (EUA). This EUA will remain in effect (meaning this test can be used) for the duration of the COVID-19 declaration under Section 564(b)(1) of the Act, 21 U.S.C. section 360bbb-3(b)(1), unless the authorization is terminated or revoked.     Resp Syncytial Virus by PCR NEGATIVE NEGATIVE Final    Comment: (NOTE) Fact Sheet for Patients: EntrepreneurPulse.com.au  Fact Sheet for Healthcare Providers: IncredibleEmployment.be  This test is not yet approved or cleared  by the [place] and has been authorized for detection and/or diagnosis of SARS-CoV-2 by FDA under an Emergency Use Authorization (EUA). This EUA will remain in effect (meaning this test can be used) for the duration of the COVID-19 declaration under Section 564(b)(1) of the Act, 21 U.S.C. section 360bbb-3(b)(1), unless the authorization is terminated or revoked.  Performed at [facility], [address]  [place], [place] [address]          Radiology Studies: No results found.      Scheduled Meds:  amLODipine  10 mg Oral Daily   carbidopa-levodopa  1 tablet Oral QHS   carbidopa-levodopa  1 tablet Oral Daily   carbidopa-levodopa  2 tablet Oral TID   enoxaparin (LOVENOX) injection  40 mg Subcutaneous Q24H   guaiFENesin  600 mg Oral BID   levothyroxine  100 mcg Oral Q0600   losartan  25 mg Oral Daily   melatonin  3 mg Oral QHS   mirtazapine  7.5 mg Oral QHS   QUEtiapine  25 mg Oral QHS   senna-docusate  1 tablet Oral BID   simvastatin  20 mg Oral QPM   Continuous Infusions:  sodium chloride Stopped (05/03/22 1944)     LOS: 6 days    Time spent: 46 minutes spent on chart review, discussion with nursing staff, consultants, updating family and interview/physical exam; more than 50% of that time was spent in counseling and/or coordination of care.    Margarett Viti J [place] DO [facility] Available via Epic secure chat 7am-7pm After these hours, please refer to coverage provider listed on [email or web address] 05/10/2022, 12:21 PM

## 2022-05-10 NOTE — Progress Notes (Signed)
Mobility Specialist: Progress Note   05/10/22 1550  Mobility  Activity Ambulated with assistance in hallway  Level of Assistance Moderate assist, patient does 50-74%  Assistive Device Front wheel walker  Distance Ambulated (ft) 40 ft  Activity Response Tolerated well  Mobility Referral Yes  $Mobility charge 1 Mobility   Pt received in the bed and agreeable to mobility. MinA with bed mobility to sit EOB and modA to stand d/t flexed trunk and posterior bias. Able to correct when ambulating with cues but with fatigue returned to flexed posture and posterior lean. No c/o pain or SOB. Pt back to bed after session with call bell and phone at his side. Bed alarm is on.   [facility] Nevena Rozenberg Mobility Specialist Please contact via SecureChat or Rehab office at [phone]

## 2022-05-11 DIAGNOSIS — Z743 Need for continuous supervision: Secondary | ICD-10-CM | POA: Diagnosis not present

## 2022-05-11 DIAGNOSIS — G20A1 Parkinson's disease without dyskinesia, without mention of fluctuations: Secondary | ICD-10-CM | POA: Diagnosis not present

## 2022-05-11 DIAGNOSIS — Z79899 Other long term (current) drug therapy: Secondary | ICD-10-CM | POA: Diagnosis not present

## 2022-05-11 DIAGNOSIS — E559 Vitamin D deficiency, unspecified: Secondary | ICD-10-CM | POA: Diagnosis not present

## 2022-05-11 DIAGNOSIS — J189 Pneumonia, unspecified organism: Secondary | ICD-10-CM | POA: Diagnosis not present

## 2022-05-11 DIAGNOSIS — E039 Hypothyroidism, unspecified: Secondary | ICD-10-CM | POA: Diagnosis not present

## 2022-05-11 DIAGNOSIS — E119 Type 2 diabetes mellitus without complications: Secondary | ICD-10-CM | POA: Diagnosis not present

## 2022-05-11 DIAGNOSIS — R0902 Hypoxemia: Secondary | ICD-10-CM | POA: Diagnosis not present

## 2022-05-11 DIAGNOSIS — Z7401 Bed confinement status: Secondary | ICD-10-CM | POA: Diagnosis not present

## 2022-05-11 DIAGNOSIS — R4182 Altered mental status, unspecified: Secondary | ICD-10-CM | POA: Diagnosis not present

## 2022-05-11 DIAGNOSIS — I959 Hypotension, unspecified: Secondary | ICD-10-CM | POA: Diagnosis not present

## 2022-05-11 DIAGNOSIS — I10 Essential (primary) hypertension: Secondary | ICD-10-CM | POA: Diagnosis not present

## 2022-05-11 DIAGNOSIS — R55 Syncope and collapse: Secondary | ICD-10-CM | POA: Diagnosis present

## 2022-05-11 DIAGNOSIS — F02A18 Dementia in other diseases classified elsewhere, mild, with other behavioral disturbance: Secondary | ICD-10-CM | POA: Diagnosis not present

## 2022-05-11 DIAGNOSIS — G3183 Dementia with Lewy bodies: Secondary | ICD-10-CM | POA: Diagnosis not present

## 2022-05-11 DIAGNOSIS — M6281 Muscle weakness (generalized): Secondary | ICD-10-CM | POA: Diagnosis not present

## 2022-05-11 DIAGNOSIS — Z1152 Encounter for screening for COVID-19: Secondary | ICD-10-CM | POA: Diagnosis not present

## 2022-05-11 DIAGNOSIS — G20C Parkinsonism, unspecified: Secondary | ICD-10-CM | POA: Diagnosis not present

## 2022-05-11 LAB — CBC
HCT: 33.3 % — ABNORMAL LOW (ref 39.0–52.0)
Hemoglobin: 11.1 g/dL — ABNORMAL LOW (ref 13.0–17.0)
MCH: 31.3 pg (ref 26.0–34.0)
MCHC: 33.3 g/dL (ref 30.0–36.0)
MCV: 93.8 fL (ref 80.0–100.0)
Platelets: 222 10*3/uL (ref 150–400)
RBC: 3.55 MIL/uL — ABNORMAL LOW (ref 4.22–5.81)
RDW: 11.9 % (ref 11.5–15.5)
WBC: 6.3 10*3/uL (ref 4.0–10.5)
nRBC: 0 % (ref 0.0–0.2)

## 2022-05-11 LAB — BASIC METABOLIC PANEL
Anion gap: 10 (ref 5–15)
BUN: 16 mg/dL (ref 8–23)
CO2: 26 mmol/L (ref 22–32)
Calcium: 8.7 mg/dL — ABNORMAL LOW (ref 8.9–10.3)
Chloride: 100 mmol/L (ref 98–111)
Creatinine, Ser: 0.88 mg/dL (ref 0.61–1.24)
GFR, Estimated: >60 mL/min (ref >60)
Glucose, Bld: 92 mg/dL (ref 70–99)
Potassium: 3.6 mmol/L (ref 3.5–5.1)
Sodium: 136 mmol/L (ref 135–145)

## 2022-05-11 LAB — MAGNESIUM: Magnesium: 2 mg/dL (ref 1.7–2.4)

## 2022-05-11 NOTE — TOC Transition Note (Signed)
Transition of Care [facility]) - CM/SW Discharge Note   Patient Details  Name: William Williamson MRN: [id number] Date of Birth: 1932/09/30  [facility] CM/SW Contact:  Milinda Antis, [place] Phone Number: 05/11/2022, 10:17 AM   Clinical Narrative:    Patient will DC to: [facility]  Anticipated DC date:  05/11/2022 Family notified: Yes; son Sales promotion account executive by: Corey Harold   Per MD patient ready for DC to SNF. RN to call report prior to discharge [phone] room 215B). RN, patient, patient's family, and facility notified of DC. Discharge Summary will be sent to facility.  Ambulance transport will be requested for patient.   CSW will sign off for now as social work intervention is no longer needed. Please consult [place] again if new needs arise.    Final next level of care: [place] Barriers to Discharge: Barriers Resolved   Patient Goals and CMS Choice      Discharge Placement PASRR number recieved:  ([id number]) PASRR number recieved:  ([id number])            Patient chooses bed at: [facility] Patient to be transferred to facility by: [facility] Name of family member notified: Watts, [place] (Son) [phone] Patient and family notified of of transfer: 05/09/22  Discharge Plan and Services Additional resources added to the After Visit Summary for                                       Social Determinants of Health ([place]) Interventions SDOH Screenings   Food Insecurity: Unknown (05/03/2022)  Housing: Low Risk  (05/03/2022)  Transportation Needs: No Transportation Needs (05/03/2022)  Utilities: Not At Risk (05/03/2022)  Alcohol Screen: Low Risk  (01/23/2021)  Depression (PHQ2-9): Low Risk  (02/14/2022)  Financial Resource Strain: Low Risk  (02/14/2022)  Physical Activity: Inactive (02/14/2022)  Social Connections: Socially Isolated (02/14/2022)  Stress: No Stress Concern Present (02/14/2022)  Tobacco Use: Medium Risk (05/03/2022)      Readmission Risk Interventions     No data to display

## 2022-05-11 NOTE — Progress Notes (Signed)
William Williamson to be discharged [place] per MD order. Patient reinforced about discharging to the facility.  Given report by the primary nurse to the facility nurse Crystal.  Patient free of lines, drains, and wounds.   Discharge packet assembled. An After Visit Summary (AVS) was printed and given to the EMS personnel. Patient escorted via stretcher and discharged to [facility] via ambulance.  Amaryllis Dyke, RN

## 2022-05-11 NOTE — Progress Notes (Signed)
Report called to facility and given to [facility]

## 2022-05-17 ENCOUNTER — Emergency Department: Payer: HMO

## 2022-05-17 ENCOUNTER — Emergency Department
Admission: EM | Admit: 2022-05-17 | Discharge: 2022-05-18 | Disposition: A | Discharge status: Home / Self Care | Payer: HMO | Attending: Emergency Medicine | Admitting: Emergency Medicine

## 2022-05-17 ENCOUNTER — Ambulatory Visit: Payer: PPO | Admitting: Family Medicine

## 2022-05-17 DIAGNOSIS — Z79899 Other long term (current) drug therapy: Secondary | ICD-10-CM | POA: Insufficient documentation

## 2022-05-17 DIAGNOSIS — G20C Parkinsonism, unspecified: Secondary | ICD-10-CM | POA: Insufficient documentation

## 2022-05-17 DIAGNOSIS — Z1152 Encounter for screening for COVID-19: Secondary | ICD-10-CM | POA: Insufficient documentation

## 2022-05-17 DIAGNOSIS — E039 Hypothyroidism, unspecified: Secondary | ICD-10-CM | POA: Insufficient documentation

## 2022-05-17 DIAGNOSIS — R4182 Altered mental status, unspecified: Secondary | ICD-10-CM | POA: Diagnosis not present

## 2022-05-17 DIAGNOSIS — R55 Syncope and collapse: Secondary | ICD-10-CM | POA: Diagnosis not present

## 2022-05-17 LAB — URINALYSIS, W/ REFLEX TO CULTURE (INFECTION SUSPECTED)
Bacteria, UA: NONE SEEN
Bilirubin Urine: NEGATIVE
Glucose, UA: NEGATIVE mg/dL
Hgb urine dipstick: NEGATIVE
Ketones, ur: 5 mg/dL — AB
Leukocytes,Ua: NEGATIVE
Nitrite: NEGATIVE
Protein, ur: NEGATIVE mg/dL
Specific Gravity, Urine: 1.019 (ref 1.005–1.030)
pH: 5 (ref 5.0–8.0)

## 2022-05-17 LAB — CBC WITH DIFFERENTIAL/PLATELET
Abs Immature Granulocytes: 0.02 10*3/uL (ref 0.00–0.07)
Basophils Absolute: 0 10*3/uL (ref 0.0–0.1)
Basophils Relative: 0 %
Eosinophils Absolute: 0 10*3/uL (ref 0.0–0.5)
Eosinophils Relative: 0 %
HCT: 33.1 % — ABNORMAL LOW (ref 39.0–52.0)
Hemoglobin: 10.7 g/dL — ABNORMAL LOW (ref 13.0–17.0)
Immature Granulocytes: 0 %
Lymphocytes Relative: 38 %
Lymphs Abs: 1.8 10*3/uL (ref 0.7–4.0)
MCH: 31 pg (ref 26.0–34.0)
MCHC: 32.3 g/dL (ref 30.0–36.0)
MCV: 95.9 fL (ref 80.0–100.0)
Monocytes Absolute: 0.5 10*3/uL (ref 0.1–1.0)
Monocytes Relative: 11 %
Neutro Abs: 2.5 10*3/uL (ref 1.7–7.7)
Neutrophils Relative %: 51 %
Platelets: 195 10*3/uL (ref 150–400)
RBC: 3.45 MIL/uL — ABNORMAL LOW (ref 4.22–5.81)
RDW: 12.4 % (ref 11.5–15.5)
WBC: 4.9 10*3/uL (ref 4.0–10.5)
nRBC: 0 % (ref 0.0–0.2)

## 2022-05-17 LAB — COMPREHENSIVE METABOLIC PANEL
ALT: 5 U/L (ref 0–44)
AST: 24 U/L (ref 15–41)
Albumin: 3.6 g/dL (ref 3.5–5.0)
Alkaline Phosphatase: 55 U/L (ref 38–126)
Anion gap: 7 (ref 5–15)
BUN: 35 mg/dL — ABNORMAL HIGH (ref 8–23)
CO2: 25 mmol/L (ref 22–32)
Calcium: 8.2 mg/dL — ABNORMAL LOW (ref 8.9–10.3)
Chloride: 103 mmol/L (ref 98–111)
Creatinine, Ser: 1 mg/dL (ref 0.61–1.24)
GFR, Estimated: >60 mL/min (ref >60)
Glucose, Bld: 130 mg/dL — ABNORMAL HIGH (ref 70–99)
Potassium: 4.1 mmol/L (ref 3.5–5.1)
Sodium: 135 mmol/L (ref 135–145)
Total Bilirubin: 0.7 mg/dL (ref 0.3–1.2)
Total Protein: 6.5 g/dL (ref 6.5–8.1)

## 2022-05-17 LAB — RESP PANEL BY RT-PCR (RSV, FLU A&B, COVID)  RVPGX2
Influenza A by PCR: NEGATIVE
Influenza B by PCR: NEGATIVE
Resp Syncytial Virus by PCR: NEGATIVE
SARS Coronavirus 2 by RT PCR: NEGATIVE

## 2022-05-17 LAB — APTT: aPTT: 33 seconds (ref 24–36)

## 2022-05-17 LAB — LACTIC ACID, PLASMA: Lactic Acid, Venous: 0.8 mmol/L (ref 0.5–1.9)

## 2022-05-17 LAB — TROPONIN I (HIGH SENSITIVITY)
Troponin I (High Sensitivity): 5 ng/L (ref <18)
Troponin I (High Sensitivity): 5 ng/L (ref <18)

## 2022-05-17 LAB — TSH: TSH: 4.452 u[IU]/mL (ref 0.350–4.500)

## 2022-05-17 LAB — PROTIME-INR
INR: 1.1 (ref 0.8–1.2)
Prothrombin Time: 14.1 seconds (ref 11.4–15.2)

## 2022-05-17 LAB — T4, FREE: Free T4: 0.99 ng/dL (ref 0.61–1.12)

## 2022-05-17 MED ORDER — CARBIDOPA-LEVODOPA ER 50-200 MG PO TBCR
1.0000 | EXTENDED_RELEASE_TABLET | Freq: Two times a day (BID) | ORAL | Status: DC
  Administered 2022-05-17: 1 via ORAL
  Filled 2022-05-17 (×2): qty 1

## 2022-05-17 NOTE — ED Provider Notes (Addendum)
[facility] Provider Note    None    (approximate)   History   Loss of Consciousness   HPI  William Williamson is a 89 y.o. male   Past medical history of Parkinson's and autonomic dysfunction, hypercholesterolemia, hypothyroidism who presents from nursing facility with syncopal episode.  He was seated when he syncopized no significant trauma, hypotensive per EMS initially but rapidly improving.  Anisocoria, history of the same and medical chart review.  Usually on clear liquid diet but poorly noncompliant with his diet, aspiration risk.  He is awake alert following commands moving all extremities at this time when I evaluate him with normal vital signs.  He denies any pain or discomfort or recent illnesses.  Lungs are clear abdomen soft nontender.  No obvious signs of trauma.  Independent Historian contributed to assessment above: EMS  External Medical Documents Reviewed: Discharge summary dated 05/09/2022 when he was admitted for altered mental status and found to have acute metabolic encephalopathy in the setting of community-acquired pneumonia thought to be aspiration related      Physical Exam   Triage Vital Signs: ED Triage Vitals  Enc Vitals Group     BP      Pulse      Resp      Temp      Temp src      SpO2      Weight      Height      Head Circumference      Peak Flow      Pain Score      Pain Loc      Pain Edu?      Excl. in [place]?     Most recent vital signs: Vitals:   05/17/22 1931 05/17/22 2015  BP: (!) 141/98   Pulse: 60 65  Resp: (!) 23 (!) 26  Temp: 97.6 F (36.4 C)   SpO2: 92% 100%    General: Awake, no distress.  CV:  Good peripheral perfusion.  Resp:  Normal effort.  Abd:  No distention.  Other:  Wake alert comfortable appearing with normal vital signs, following commands, moving all extremities, no dysarthria, extraocular movements intact, mild anisocoria right greater than left, noted in his medical chart  history from prior surgery.  Lungs are clear without obvious wheezing focalities abdomen is soft and nontender   ED Results / Procedures / Treatments   Labs (all labs ordered are listed, but only abnormal results are displayed) Labs Reviewed  COMPREHENSIVE METABOLIC PANEL - Abnormal; Notable for the following components:      Result Value   Glucose, Bld 130 (*)    BUN 35 (*)    Calcium 8.2 (*)    All other components within normal limits  CBC WITH DIFFERENTIAL/PLATELET - Abnormal; Notable for the following components:   RBC 3.45 (*)    Hemoglobin 10.7 (*)    HCT 33.1 (*)    All other components within normal limits  URINALYSIS, W/ REFLEX TO CULTURE (INFECTION SUSPECTED) - Abnormal; Notable for the following components:   Color, Urine YELLOW (*)    APPearance CLEAR (*)    Ketones, ur 5 (*)    All other components within normal limits  RESP PANEL BY RT-PCR (RSV, FLU A&B, COVID)  [id number]  CULTURE, BLOOD (ROUTINE X 2) W REFLEX TO ID PANEL  CULTURE, BLOOD (ROUTINE X 2) W REFLEX TO ID PANEL  LACTIC ACID, PLASMA  PROTIME-INR  APTT  TSH  T4, FREE  LACTIC ACID, PLASMA  TROPONIN I (HIGH SENSITIVITY)  TROPONIN I (HIGH SENSITIVITY)     I ordered and reviewed the above labs they are notable for normal electrolytes, baseline H&H, negative initial troponin  EKG  ED ECG REPORT I, Lucillie Garfinkel, the attending physician, personally viewed and interpreted this ECG.   Date: 05/17/2022  EKG Time: 1931  Rate: 60s  Rhythm: NSR  Axis: nl  Intervals:none  ST&T Change: no ischemic changes -- tremors distort baseline but appears NSR     RADIOLOGY I independently reviewed and interpreted CT of the head and see no obvious bleeding or midline shift   PROCEDURES:  Critical Care performed: No  Procedures   MEDICATIONS ORDERED IN ED: Medications  carbidopa-levodopa (SINEMET CR) 50-200 MG per tablet controlled release 1 tablet (1 tablet Oral Given 05/17/22 2248)   IMPRESSION / MDM /  ASSESSMENT AND PLAN / ED COURSE  I reviewed the triage vital signs and the nursing notes.                                Patient's presentation is most consistent with acute presentation with potential threat to life or bodily function.  Differential diagnosis includes, but is not limited to, syncope in the setting of autonomic dysfunction from Parkinson's disease, ACS, dysrhythmia, CVA, seizure, metabolic derangement/hypoglycemia, sepsis or infection, consider aspiration pneumonia   The patient is on the cardiac monitor to evaluate for evidence of arrhythmia and/or significant heart rate changes.  MDM: This is a patient with syncopal episode while seated rapidly improving.  No seizure-like activity no history of seizures no history of trauma during this event.  Autonomic instability from Parkinson's disease noted on medical chart review, most likely this is the reason.  He also has aspiration risk, check chest x-ray.  Check basic labs for electrolyte derangements, CT of the head for intracranial bleeding.  Given no focal neurologic deficits and rapid resolution I doubt he is having a stroke.  Patient has been stable, awake, alert with no other syncopal events.  Workup thus far has been unremarkable.  He is pending only a repeat troponin.    Considered admission/observation on telemetry monitoring but since he has a negative workup as above and history of autonomic instability with Parkinson's likely syncopal event in the setting of that, so outpatient monitoring and follow-up is most appropriate at this time.       FINAL CLINICAL IMPRESSION(S) / ED DIAGNOSES   Final diagnoses:  Syncope and collapse     Rx / DC Orders   ED Discharge Orders     None        Note:  This document was prepared using Dragon voice recognition software and may include unintentional dictation errors.    Lucillie Garfinkel, MD 05/17/22 Janus Molder    Lucillie Garfinkel, MD 05/17/22 [phone]

## 2022-05-17 NOTE — ED Notes (Signed)
Report received from [place], [place]

## 2022-05-17 NOTE — ED Notes (Signed)
To CT

## 2022-05-17 NOTE — Discharge Instructions (Addendum)
Thank you for choosing [place] for your health care today!  You were seen in the emergency department for a fainting episode.  Your blood test and your heart tests were normal.  A CT scan of your brain appeared normal.  All of your vital signs remained normal while you are in the emergency department.  Please see your primary doctor this week for a follow up appointment.   Sometimes, in the early stages of certain disease courses it is difficult to detect in the emergency department evaluation -- so, it is important that you continue to monitor your symptoms and call your doctor right away or return to the emergency department if you develop any new or worsening symptoms.  Please go to the following website to schedule new (and existing) patient appointments:   [email or web address]  If you do not have a primary doctor try calling the following clinics to establish care:  If you have insurance:  [facility] [phone] [place] [address]   [facility]  [phone] [place] [place] [address]   If you do not have insurance:  [facility]  [phone] [address], [place] [address]   The following is another list of primary care offices in the area who are accepting new patients at this time.  Please reach out to one of them directly and let them know you would like to schedule an appointment to follow up on an Emergency Department visit, and/or to establish a new primary care provider (PCP).  There are likely other primary care clinics in the are who are accepting new patients, but this is an excellent place to start:  [place] physician: Dr Lavon Paganini [address] [place], [place] [address] [phone]  [facility] Lead Physician: Dr Steele Sizer [address], [place], [place] [address] [phone]  [facility] Physician: Dr Park Liter [address] [place], [place] [address] [phone]  [facility] Lead Physician: Dr Dewaine Oats [employer], [place], [place] [address] [phone]  [facility] Physician: Dr Halina Maidens [address] [place], [place] [address] [phone]   It was my pleasure to care for you today.   Hoover Brunette Jacelyn Grip, MD

## 2022-05-17 NOTE — ED Triage Notes (Addendum)
Patient had witnessed syncopal episode and was unresponsive for approx 2 minutes. In route, EMS states that patient's pupils were equal upon arrival, but left pupil began to dilate while the right pupil remained the same. Initial blood pressure per EMS was 76/30.

## 2022-05-18 NOTE — ED Notes (Signed)
[facility] called for transport to [facility]

## 2022-05-18 NOTE — ED Notes (Signed)
Awaiting EMS for transport

## 2022-05-18 NOTE — ED Notes (Signed)
Spoke with [name], son regarding patient getting DC

## 2022-05-18 NOTE — ED Notes (Signed)
Report given to [facility] at [facility]

## 2022-05-22 LAB — CULTURE, BLOOD (ROUTINE X 2)
Culture: NO GROWTH
Culture: NO GROWTH
Special Requests: ADEQUATE

## 2022-05-23 ENCOUNTER — Telehealth: Payer: Self-pay

## 2022-05-23 NOTE — Telephone Encounter (Signed)
     Patient  visit on 2/16  at [facility]   Have you been able to follow up with your primary care physician? Yes   The patient was or was not able to obtain any needed medicine or equipment. Yes   Are there diet recommendations that you are having difficulty following? Na   Patient expresses understanding of discharge instructions and education provided has no other needs at this time.  Yes     [facility] [phone] [address], [place], [place] [address] Phone: [phone] Email: [email or web address]

## 2022-06-18 DIAGNOSIS — R279 Unspecified lack of coordination: Secondary | ICD-10-CM | POA: Diagnosis not present

## 2022-06-18 DIAGNOSIS — R262 Difficulty in walking, not elsewhere classified: Secondary | ICD-10-CM | POA: Diagnosis not present

## 2022-06-18 DIAGNOSIS — M6281 Muscle weakness (generalized): Secondary | ICD-10-CM | POA: Diagnosis not present

## 2022-06-28 DIAGNOSIS — E039 Hypothyroidism, unspecified: Secondary | ICD-10-CM | POA: Diagnosis not present

## 2022-06-28 DIAGNOSIS — E785 Hyperlipidemia, unspecified: Secondary | ICD-10-CM | POA: Diagnosis not present

## 2022-06-28 DIAGNOSIS — I10 Essential (primary) hypertension: Secondary | ICD-10-CM | POA: Diagnosis not present

## 2022-06-28 DIAGNOSIS — G20A1 Parkinson's disease without dyskinesia, without mention of fluctuations: Secondary | ICD-10-CM | POA: Diagnosis not present

## 2022-06-28 DIAGNOSIS — F0390 Unspecified dementia without behavioral disturbance: Secondary | ICD-10-CM | POA: Diagnosis not present

## 2022-06-28 DIAGNOSIS — G4700 Insomnia, unspecified: Secondary | ICD-10-CM | POA: Diagnosis not present

## 2022-06-28 DIAGNOSIS — R630 Anorexia: Secondary | ICD-10-CM | POA: Diagnosis not present

## 2022-06-28 DIAGNOSIS — E559 Vitamin D deficiency, unspecified: Secondary | ICD-10-CM | POA: Diagnosis not present

## 2022-07-02 DIAGNOSIS — I10 Essential (primary) hypertension: Secondary | ICD-10-CM | POA: Diagnosis not present

## 2022-07-02 DIAGNOSIS — H6123 Impacted cerumen, bilateral: Secondary | ICD-10-CM | POA: Diagnosis not present

## 2022-07-02 DIAGNOSIS — Z6820 Body mass index (BMI) 20.0-20.9, adult: Secondary | ICD-10-CM | POA: Diagnosis not present

## 2022-07-05 DIAGNOSIS — E039 Hypothyroidism, unspecified: Secondary | ICD-10-CM | POA: Diagnosis not present

## 2022-07-05 DIAGNOSIS — Z8701 Personal history of pneumonia (recurrent): Secondary | ICD-10-CM | POA: Diagnosis not present

## 2022-07-05 DIAGNOSIS — Z9181 History of falling: Secondary | ICD-10-CM | POA: Diagnosis not present

## 2022-07-05 DIAGNOSIS — I10 Essential (primary) hypertension: Secondary | ICD-10-CM | POA: Diagnosis not present

## 2022-07-05 DIAGNOSIS — R1312 Dysphagia, oropharyngeal phase: Secondary | ICD-10-CM | POA: Diagnosis not present

## 2022-07-05 DIAGNOSIS — R1310 Dysphagia, unspecified: Secondary | ICD-10-CM | POA: Diagnosis not present

## 2022-07-05 DIAGNOSIS — G4700 Insomnia, unspecified: Secondary | ICD-10-CM | POA: Diagnosis not present

## 2022-07-05 DIAGNOSIS — E785 Hyperlipidemia, unspecified: Secondary | ICD-10-CM | POA: Diagnosis not present

## 2022-07-05 DIAGNOSIS — F02818 Dementia in other diseases classified elsewhere, unspecified severity, with other behavioral disturbance: Secondary | ICD-10-CM | POA: Diagnosis not present

## 2022-07-05 DIAGNOSIS — E559 Vitamin D deficiency, unspecified: Secondary | ICD-10-CM | POA: Diagnosis not present

## 2022-07-05 DIAGNOSIS — G20A1 Parkinson's disease without dyskinesia, without mention of fluctuations: Secondary | ICD-10-CM | POA: Diagnosis not present

## 2022-07-05 DIAGNOSIS — I959 Hypotension, unspecified: Secondary | ICD-10-CM | POA: Diagnosis not present

## 2022-07-05 DIAGNOSIS — G9341 Metabolic encephalopathy: Secondary | ICD-10-CM | POA: Diagnosis not present

## 2022-07-09 ENCOUNTER — Telehealth: Payer: Self-pay | Admitting: Family Medicine

## 2022-07-09 DIAGNOSIS — K5900 Constipation, unspecified: Secondary | ICD-10-CM | POA: Diagnosis not present

## 2022-07-09 DIAGNOSIS — G20C Parkinsonism, unspecified: Secondary | ICD-10-CM | POA: Diagnosis not present

## 2022-07-09 DIAGNOSIS — E039 Hypothyroidism, unspecified: Secondary | ICD-10-CM | POA: Diagnosis not present

## 2022-07-09 DIAGNOSIS — E559 Vitamin D deficiency, unspecified: Secondary | ICD-10-CM | POA: Diagnosis not present

## 2022-07-09 DIAGNOSIS — F5101 Primary insomnia: Secondary | ICD-10-CM | POA: Diagnosis not present

## 2022-07-09 DIAGNOSIS — E785 Hyperlipidemia, unspecified: Secondary | ICD-10-CM | POA: Diagnosis not present

## 2022-07-09 DIAGNOSIS — I10 Essential (primary) hypertension: Secondary | ICD-10-CM | POA: Diagnosis not present

## 2022-07-09 NOTE — Telephone Encounter (Signed)
Caller/Agency: Ladona Ridgel [facility]) Callback Number: [phone] Requesting OT/PT/Skilled Nursing/Social Work/Speech Therapy: Speech Therapy Frequency: 1 w 2

## 2022-07-10 NOTE — Telephone Encounter (Signed)
VM left with verbal °

## 2022-07-12 DIAGNOSIS — R1312 Dysphagia, oropharyngeal phase: Secondary | ICD-10-CM | POA: Diagnosis not present

## 2022-07-12 DIAGNOSIS — E559 Vitamin D deficiency, unspecified: Secondary | ICD-10-CM | POA: Diagnosis not present

## 2022-07-12 DIAGNOSIS — Z8701 Personal history of pneumonia (recurrent): Secondary | ICD-10-CM | POA: Diagnosis not present

## 2022-07-12 DIAGNOSIS — R1310 Dysphagia, unspecified: Secondary | ICD-10-CM | POA: Diagnosis not present

## 2022-07-12 DIAGNOSIS — F02818 Dementia in other diseases classified elsewhere, unspecified severity, with other behavioral disturbance: Secondary | ICD-10-CM | POA: Diagnosis not present

## 2022-07-12 DIAGNOSIS — G9341 Metabolic encephalopathy: Secondary | ICD-10-CM | POA: Diagnosis not present

## 2022-07-12 DIAGNOSIS — E039 Hypothyroidism, unspecified: Secondary | ICD-10-CM | POA: Diagnosis not present

## 2022-07-12 DIAGNOSIS — Z9181 History of falling: Secondary | ICD-10-CM | POA: Diagnosis not present

## 2022-07-12 DIAGNOSIS — I959 Hypotension, unspecified: Secondary | ICD-10-CM | POA: Diagnosis not present

## 2022-07-12 DIAGNOSIS — G20A1 Parkinson's disease without dyskinesia, without mention of fluctuations: Secondary | ICD-10-CM | POA: Diagnosis not present

## 2022-07-12 DIAGNOSIS — E785 Hyperlipidemia, unspecified: Secondary | ICD-10-CM | POA: Diagnosis not present

## 2022-07-12 DIAGNOSIS — I10 Essential (primary) hypertension: Secondary | ICD-10-CM | POA: Diagnosis not present

## 2022-07-12 DIAGNOSIS — G4700 Insomnia, unspecified: Secondary | ICD-10-CM | POA: Diagnosis not present

## 2022-07-17 DIAGNOSIS — E785 Hyperlipidemia, unspecified: Secondary | ICD-10-CM | POA: Diagnosis not present

## 2022-07-17 DIAGNOSIS — I10 Essential (primary) hypertension: Secondary | ICD-10-CM | POA: Diagnosis not present

## 2022-07-17 DIAGNOSIS — F02818 Dementia in other diseases classified elsewhere, unspecified severity, with other behavioral disturbance: Secondary | ICD-10-CM | POA: Diagnosis not present

## 2022-07-17 DIAGNOSIS — R1312 Dysphagia, oropharyngeal phase: Secondary | ICD-10-CM | POA: Diagnosis not present

## 2022-07-17 DIAGNOSIS — Z9181 History of falling: Secondary | ICD-10-CM | POA: Diagnosis not present

## 2022-07-17 DIAGNOSIS — F331 Major depressive disorder, recurrent, moderate: Secondary | ICD-10-CM | POA: Diagnosis not present

## 2022-07-17 DIAGNOSIS — I959 Hypotension, unspecified: Secondary | ICD-10-CM | POA: Diagnosis not present

## 2022-07-17 DIAGNOSIS — E039 Hypothyroidism, unspecified: Secondary | ICD-10-CM | POA: Diagnosis not present

## 2022-07-17 DIAGNOSIS — F411 Generalized anxiety disorder: Secondary | ICD-10-CM | POA: Diagnosis not present

## 2022-07-17 DIAGNOSIS — G20A1 Parkinson's disease without dyskinesia, without mention of fluctuations: Secondary | ICD-10-CM | POA: Diagnosis not present

## 2022-07-17 DIAGNOSIS — Z8701 Personal history of pneumonia (recurrent): Secondary | ICD-10-CM | POA: Diagnosis not present

## 2022-07-17 DIAGNOSIS — G9341 Metabolic encephalopathy: Secondary | ICD-10-CM | POA: Diagnosis not present

## 2022-07-17 DIAGNOSIS — G20C Parkinsonism, unspecified: Secondary | ICD-10-CM | POA: Diagnosis not present

## 2022-07-17 DIAGNOSIS — E559 Vitamin D deficiency, unspecified: Secondary | ICD-10-CM | POA: Diagnosis not present

## 2022-07-17 DIAGNOSIS — F5105 Insomnia due to other mental disorder: Secondary | ICD-10-CM | POA: Diagnosis not present

## 2022-07-17 DIAGNOSIS — G4700 Insomnia, unspecified: Secondary | ICD-10-CM | POA: Diagnosis not present

## 2022-07-17 DIAGNOSIS — R1310 Dysphagia, unspecified: Secondary | ICD-10-CM | POA: Diagnosis not present

## 2022-07-18 DIAGNOSIS — E785 Hyperlipidemia, unspecified: Secondary | ICD-10-CM | POA: Diagnosis not present

## 2022-07-18 DIAGNOSIS — Z8701 Personal history of pneumonia (recurrent): Secondary | ICD-10-CM | POA: Diagnosis not present

## 2022-07-18 DIAGNOSIS — E559 Vitamin D deficiency, unspecified: Secondary | ICD-10-CM | POA: Diagnosis not present

## 2022-07-18 DIAGNOSIS — G9341 Metabolic encephalopathy: Secondary | ICD-10-CM | POA: Diagnosis not present

## 2022-07-18 DIAGNOSIS — G4700 Insomnia, unspecified: Secondary | ICD-10-CM | POA: Diagnosis not present

## 2022-07-18 DIAGNOSIS — I10 Essential (primary) hypertension: Secondary | ICD-10-CM | POA: Diagnosis not present

## 2022-07-18 DIAGNOSIS — R1310 Dysphagia, unspecified: Secondary | ICD-10-CM | POA: Diagnosis not present

## 2022-07-18 DIAGNOSIS — E039 Hypothyroidism, unspecified: Secondary | ICD-10-CM | POA: Diagnosis not present

## 2022-07-18 DIAGNOSIS — F02818 Dementia in other diseases classified elsewhere, unspecified severity, with other behavioral disturbance: Secondary | ICD-10-CM | POA: Diagnosis not present

## 2022-07-18 DIAGNOSIS — R1312 Dysphagia, oropharyngeal phase: Secondary | ICD-10-CM | POA: Diagnosis not present

## 2022-07-18 DIAGNOSIS — Z9181 History of falling: Secondary | ICD-10-CM | POA: Diagnosis not present

## 2022-07-18 DIAGNOSIS — I959 Hypotension, unspecified: Secondary | ICD-10-CM | POA: Diagnosis not present

## 2022-07-18 DIAGNOSIS — G20A1 Parkinson's disease without dyskinesia, without mention of fluctuations: Secondary | ICD-10-CM | POA: Diagnosis not present

## 2022-07-23 DIAGNOSIS — F0390 Unspecified dementia without behavioral disturbance: Secondary | ICD-10-CM | POA: Diagnosis not present

## 2022-07-23 DIAGNOSIS — R296 Repeated falls: Secondary | ICD-10-CM | POA: Diagnosis not present

## 2022-07-23 DIAGNOSIS — G20C Parkinsonism, unspecified: Secondary | ICD-10-CM | POA: Diagnosis not present

## 2022-07-31 DIAGNOSIS — E782 Mixed hyperlipidemia: Secondary | ICD-10-CM | POA: Diagnosis not present

## 2022-07-31 DIAGNOSIS — F339 Major depressive disorder, recurrent, unspecified: Secondary | ICD-10-CM | POA: Diagnosis not present

## 2022-08-01 DIAGNOSIS — E782 Mixed hyperlipidemia: Secondary | ICD-10-CM | POA: Diagnosis not present

## 2022-08-01 DIAGNOSIS — F331 Major depressive disorder, recurrent, moderate: Secondary | ICD-10-CM | POA: Diagnosis not present

## 2022-08-01 DIAGNOSIS — I10 Essential (primary) hypertension: Secondary | ICD-10-CM | POA: Diagnosis not present

## 2022-08-01 DIAGNOSIS — E039 Hypothyroidism, unspecified: Secondary | ICD-10-CM | POA: Diagnosis not present

## 2022-08-02 DIAGNOSIS — E039 Hypothyroidism, unspecified: Secondary | ICD-10-CM | POA: Diagnosis not present

## 2022-08-02 DIAGNOSIS — G4700 Insomnia, unspecified: Secondary | ICD-10-CM | POA: Diagnosis not present

## 2022-08-02 DIAGNOSIS — Z8701 Personal history of pneumonia (recurrent): Secondary | ICD-10-CM | POA: Diagnosis not present

## 2022-08-02 DIAGNOSIS — R1310 Dysphagia, unspecified: Secondary | ICD-10-CM | POA: Diagnosis not present

## 2022-08-02 DIAGNOSIS — E785 Hyperlipidemia, unspecified: Secondary | ICD-10-CM | POA: Diagnosis not present

## 2022-08-02 DIAGNOSIS — I959 Hypotension, unspecified: Secondary | ICD-10-CM | POA: Diagnosis not present

## 2022-08-02 DIAGNOSIS — G20A1 Parkinson's disease without dyskinesia, without mention of fluctuations: Secondary | ICD-10-CM | POA: Diagnosis not present

## 2022-08-02 DIAGNOSIS — F02818 Dementia in other diseases classified elsewhere, unspecified severity, with other behavioral disturbance: Secondary | ICD-10-CM | POA: Diagnosis not present

## 2022-08-02 DIAGNOSIS — R1312 Dysphagia, oropharyngeal phase: Secondary | ICD-10-CM | POA: Diagnosis not present

## 2022-08-02 DIAGNOSIS — Z9181 History of falling: Secondary | ICD-10-CM | POA: Diagnosis not present

## 2022-08-02 DIAGNOSIS — G9341 Metabolic encephalopathy: Secondary | ICD-10-CM | POA: Diagnosis not present

## 2022-08-02 DIAGNOSIS — I10 Essential (primary) hypertension: Secondary | ICD-10-CM | POA: Diagnosis not present

## 2022-08-02 DIAGNOSIS — E559 Vitamin D deficiency, unspecified: Secondary | ICD-10-CM | POA: Diagnosis not present

## 2022-08-13 DIAGNOSIS — M542 Cervicalgia: Secondary | ICD-10-CM | POA: Diagnosis not present

## 2022-08-13 DIAGNOSIS — M79603 Pain in arm, unspecified: Secondary | ICD-10-CM | POA: Diagnosis not present

## 2022-08-13 DIAGNOSIS — I959 Hypotension, unspecified: Secondary | ICD-10-CM | POA: Diagnosis not present

## 2022-08-13 DIAGNOSIS — G4489 Other headache syndrome: Secondary | ICD-10-CM | POA: Diagnosis not present

## 2022-08-13 DIAGNOSIS — R531 Weakness: Secondary | ICD-10-CM | POA: Diagnosis not present

## 2022-08-14 DIAGNOSIS — F339 Major depressive disorder, recurrent, unspecified: Secondary | ICD-10-CM | POA: Diagnosis not present

## 2022-08-14 DIAGNOSIS — F5105 Insomnia due to other mental disorder: Secondary | ICD-10-CM | POA: Diagnosis not present

## 2022-08-14 DIAGNOSIS — F411 Generalized anxiety disorder: Secondary | ICD-10-CM | POA: Diagnosis not present

## 2022-08-14 DIAGNOSIS — G20A1 Parkinson's disease without dyskinesia, without mention of fluctuations: Secondary | ICD-10-CM | POA: Diagnosis not present

## 2022-08-16 DIAGNOSIS — E559 Vitamin D deficiency, unspecified: Secondary | ICD-10-CM | POA: Diagnosis not present

## 2022-08-16 DIAGNOSIS — G9341 Metabolic encephalopathy: Secondary | ICD-10-CM | POA: Diagnosis not present

## 2022-08-16 DIAGNOSIS — I10 Essential (primary) hypertension: Secondary | ICD-10-CM | POA: Diagnosis not present

## 2022-08-16 DIAGNOSIS — R1310 Dysphagia, unspecified: Secondary | ICD-10-CM | POA: Diagnosis not present

## 2022-08-16 DIAGNOSIS — E785 Hyperlipidemia, unspecified: Secondary | ICD-10-CM | POA: Diagnosis not present

## 2022-08-16 DIAGNOSIS — Z8701 Personal history of pneumonia (recurrent): Secondary | ICD-10-CM | POA: Diagnosis not present

## 2022-08-16 DIAGNOSIS — I959 Hypotension, unspecified: Secondary | ICD-10-CM | POA: Diagnosis not present

## 2022-08-16 DIAGNOSIS — E039 Hypothyroidism, unspecified: Secondary | ICD-10-CM | POA: Diagnosis not present

## 2022-08-16 DIAGNOSIS — R1312 Dysphagia, oropharyngeal phase: Secondary | ICD-10-CM | POA: Diagnosis not present

## 2022-08-16 DIAGNOSIS — G20A1 Parkinson's disease without dyskinesia, without mention of fluctuations: Secondary | ICD-10-CM | POA: Diagnosis not present

## 2022-08-16 DIAGNOSIS — G4700 Insomnia, unspecified: Secondary | ICD-10-CM | POA: Diagnosis not present

## 2022-08-16 DIAGNOSIS — Z9181 History of falling: Secondary | ICD-10-CM | POA: Diagnosis not present

## 2022-08-16 DIAGNOSIS — F02818 Dementia in other diseases classified elsewhere, unspecified severity, with other behavioral disturbance: Secondary | ICD-10-CM | POA: Diagnosis not present

## 2022-08-27 DIAGNOSIS — E039 Hypothyroidism, unspecified: Secondary | ICD-10-CM | POA: Diagnosis not present

## 2022-08-27 DIAGNOSIS — I10 Essential (primary) hypertension: Secondary | ICD-10-CM | POA: Diagnosis not present

## 2022-08-27 DIAGNOSIS — F5101 Primary insomnia: Secondary | ICD-10-CM | POA: Diagnosis not present

## 2022-08-27 DIAGNOSIS — E782 Mixed hyperlipidemia: Secondary | ICD-10-CM | POA: Diagnosis not present

## 2022-08-27 DIAGNOSIS — G20C Parkinsonism, unspecified: Secondary | ICD-10-CM | POA: Diagnosis not present

## 2022-08-27 DIAGNOSIS — E559 Vitamin D deficiency, unspecified: Secondary | ICD-10-CM | POA: Diagnosis not present

## 2022-08-27 DIAGNOSIS — K5900 Constipation, unspecified: Secondary | ICD-10-CM | POA: Diagnosis not present

## 2022-08-28 DIAGNOSIS — F339 Major depressive disorder, recurrent, unspecified: Secondary | ICD-10-CM | POA: Diagnosis not present

## 2022-08-28 DIAGNOSIS — F28 Other psychotic disorder not due to a substance or known physiological condition: Secondary | ICD-10-CM | POA: Diagnosis not present

## 2022-08-29 DIAGNOSIS — G9341 Metabolic encephalopathy: Secondary | ICD-10-CM | POA: Diagnosis not present

## 2022-08-29 DIAGNOSIS — R1310 Dysphagia, unspecified: Secondary | ICD-10-CM | POA: Diagnosis not present

## 2022-08-29 DIAGNOSIS — F02818 Dementia in other diseases classified elsewhere, unspecified severity, with other behavioral disturbance: Secondary | ICD-10-CM | POA: Diagnosis not present

## 2022-08-29 DIAGNOSIS — I10 Essential (primary) hypertension: Secondary | ICD-10-CM | POA: Diagnosis not present

## 2022-08-29 DIAGNOSIS — R1312 Dysphagia, oropharyngeal phase: Secondary | ICD-10-CM | POA: Diagnosis not present

## 2022-08-29 DIAGNOSIS — G20A1 Parkinson's disease without dyskinesia, without mention of fluctuations: Secondary | ICD-10-CM | POA: Diagnosis not present

## 2022-09-11 DIAGNOSIS — Z1283 Encounter for screening for malignant neoplasm of skin: Secondary | ICD-10-CM | POA: Diagnosis not present

## 2022-09-11 DIAGNOSIS — F5105 Insomnia due to other mental disorder: Secondary | ICD-10-CM | POA: Diagnosis not present

## 2022-09-11 DIAGNOSIS — L720 Epidermal cyst: Secondary | ICD-10-CM | POA: Diagnosis not present

## 2022-09-11 DIAGNOSIS — F411 Generalized anxiety disorder: Secondary | ICD-10-CM | POA: Diagnosis not present

## 2022-09-11 DIAGNOSIS — D225 Melanocytic nevi of trunk: Secondary | ICD-10-CM | POA: Diagnosis not present

## 2022-09-11 DIAGNOSIS — G20A1 Parkinson's disease without dyskinesia, without mention of fluctuations: Secondary | ICD-10-CM | POA: Diagnosis not present

## 2022-09-11 DIAGNOSIS — F339 Major depressive disorder, recurrent, unspecified: Secondary | ICD-10-CM | POA: Diagnosis not present

## 2022-09-13 DIAGNOSIS — I10 Essential (primary) hypertension: Secondary | ICD-10-CM | POA: Diagnosis not present

## 2022-09-13 DIAGNOSIS — R1312 Dysphagia, oropharyngeal phase: Secondary | ICD-10-CM | POA: Diagnosis not present

## 2022-09-13 DIAGNOSIS — R1310 Dysphagia, unspecified: Secondary | ICD-10-CM | POA: Diagnosis not present

## 2022-09-13 DIAGNOSIS — F02818 Dementia in other diseases classified elsewhere, unspecified severity, with other behavioral disturbance: Secondary | ICD-10-CM | POA: Diagnosis not present

## 2022-09-13 DIAGNOSIS — G9341 Metabolic encephalopathy: Secondary | ICD-10-CM | POA: Diagnosis not present

## 2022-09-13 DIAGNOSIS — G20A1 Parkinson's disease without dyskinesia, without mention of fluctuations: Secondary | ICD-10-CM | POA: Diagnosis not present

## 2022-09-24 DIAGNOSIS — F419 Anxiety disorder, unspecified: Secondary | ICD-10-CM | POA: Diagnosis not present

## 2022-09-24 DIAGNOSIS — F329 Major depressive disorder, single episode, unspecified: Secondary | ICD-10-CM | POA: Diagnosis not present

## 2022-09-25 DIAGNOSIS — F5105 Insomnia due to other mental disorder: Secondary | ICD-10-CM | POA: Diagnosis not present

## 2022-09-25 DIAGNOSIS — F411 Generalized anxiety disorder: Secondary | ICD-10-CM | POA: Diagnosis not present

## 2022-09-25 DIAGNOSIS — G20A1 Parkinson's disease without dyskinesia, without mention of fluctuations: Secondary | ICD-10-CM | POA: Diagnosis not present

## 2022-09-25 DIAGNOSIS — F5101 Primary insomnia: Secondary | ICD-10-CM | POA: Diagnosis not present

## 2022-09-25 DIAGNOSIS — F339 Major depressive disorder, recurrent, unspecified: Secondary | ICD-10-CM | POA: Diagnosis not present

## 2022-09-25 DIAGNOSIS — K5900 Constipation, unspecified: Secondary | ICD-10-CM | POA: Diagnosis not present

## 2022-09-25 DIAGNOSIS — F28 Other psychotic disorder not due to a substance or known physiological condition: Secondary | ICD-10-CM | POA: Diagnosis not present

## 2022-09-25 DIAGNOSIS — E039 Hypothyroidism, unspecified: Secondary | ICD-10-CM | POA: Diagnosis not present

## 2022-09-25 DIAGNOSIS — G20C Parkinsonism, unspecified: Secondary | ICD-10-CM | POA: Diagnosis not present

## 2022-09-25 DIAGNOSIS — E559 Vitamin D deficiency, unspecified: Secondary | ICD-10-CM | POA: Diagnosis not present

## 2022-09-25 DIAGNOSIS — E782 Mixed hyperlipidemia: Secondary | ICD-10-CM | POA: Diagnosis not present

## 2022-09-25 DIAGNOSIS — I10 Essential (primary) hypertension: Secondary | ICD-10-CM | POA: Diagnosis not present

## 2022-10-09 DIAGNOSIS — G20C Parkinsonism, unspecified: Secondary | ICD-10-CM | POA: Diagnosis not present

## 2022-10-09 DIAGNOSIS — F331 Major depressive disorder, recurrent, moderate: Secondary | ICD-10-CM | POA: Diagnosis not present

## 2022-10-09 DIAGNOSIS — F411 Generalized anxiety disorder: Secondary | ICD-10-CM | POA: Diagnosis not present

## 2022-10-09 DIAGNOSIS — F5105 Insomnia due to other mental disorder: Secondary | ICD-10-CM | POA: Diagnosis not present

## 2022-10-13 DIAGNOSIS — R1312 Dysphagia, oropharyngeal phase: Secondary | ICD-10-CM | POA: Diagnosis not present

## 2022-10-13 DIAGNOSIS — R1310 Dysphagia, unspecified: Secondary | ICD-10-CM | POA: Diagnosis not present

## 2022-10-13 DIAGNOSIS — G20A1 Parkinson's disease without dyskinesia, without mention of fluctuations: Secondary | ICD-10-CM | POA: Diagnosis not present

## 2022-10-13 DIAGNOSIS — I10 Essential (primary) hypertension: Secondary | ICD-10-CM | POA: Diagnosis not present

## 2022-10-13 DIAGNOSIS — G9341 Metabolic encephalopathy: Secondary | ICD-10-CM | POA: Diagnosis not present

## 2022-10-13 DIAGNOSIS — F02818 Dementia in other diseases classified elsewhere, unspecified severity, with other behavioral disturbance: Secondary | ICD-10-CM | POA: Diagnosis not present

## 2022-10-18 ENCOUNTER — Other Ambulatory Visit: Payer: Self-pay

## 2022-10-18 ENCOUNTER — Emergency Department (HOSPITAL_COMMUNITY)
Admission: EM | Admit: 2022-10-18 | Discharge: 2022-10-18 | Disposition: A | Discharge status: Home / Self Care | Payer: HMO | Attending: Emergency Medicine | Admitting: Emergency Medicine

## 2022-10-18 ENCOUNTER — Encounter (HOSPITAL_COMMUNITY): Payer: Self-pay

## 2022-10-18 ENCOUNTER — Emergency Department (HOSPITAL_COMMUNITY): Payer: HMO

## 2022-10-18 DIAGNOSIS — Z79899 Other long term (current) drug therapy: Secondary | ICD-10-CM | POA: Diagnosis not present

## 2022-10-18 DIAGNOSIS — R0689 Other abnormalities of breathing: Secondary | ICD-10-CM | POA: Diagnosis not present

## 2022-10-18 DIAGNOSIS — G20B2 Parkinson's disease with dyskinesia, with fluctuations: Secondary | ICD-10-CM | POA: Diagnosis not present

## 2022-10-18 DIAGNOSIS — M4802 Spinal stenosis, cervical region: Secondary | ICD-10-CM | POA: Diagnosis not present

## 2022-10-18 DIAGNOSIS — S0101XA Laceration without foreign body of scalp, initial encounter: Secondary | ICD-10-CM

## 2022-10-18 DIAGNOSIS — R58 Hemorrhage, not elsewhere classified: Secondary | ICD-10-CM | POA: Diagnosis not present

## 2022-10-18 DIAGNOSIS — S0003XA Contusion of scalp, initial encounter: Secondary | ICD-10-CM

## 2022-10-18 DIAGNOSIS — W010XXA Fall on same level from slipping, tripping and stumbling without subsequent striking against object, initial encounter: Secondary | ICD-10-CM

## 2022-10-18 DIAGNOSIS — Z7401 Bed confinement status: Secondary | ICD-10-CM | POA: Diagnosis not present

## 2022-10-18 DIAGNOSIS — G20B1 Parkinson's disease with dyskinesia, without mention of fluctuations: Secondary | ICD-10-CM

## 2022-10-18 DIAGNOSIS — W0110XA Fall on same level from slipping, tripping and stumbling with subsequent striking against unspecified object, initial encounter: Secondary | ICD-10-CM | POA: Insufficient documentation

## 2022-10-18 DIAGNOSIS — W19XXXA Unspecified fall, initial encounter: Secondary | ICD-10-CM | POA: Diagnosis not present

## 2022-10-18 DIAGNOSIS — I10 Essential (primary) hypertension: Secondary | ICD-10-CM | POA: Diagnosis not present

## 2022-10-18 DIAGNOSIS — R030 Elevated blood-pressure reading, without diagnosis of hypertension: Secondary | ICD-10-CM

## 2022-10-18 DIAGNOSIS — R0902 Hypoxemia: Secondary | ICD-10-CM | POA: Diagnosis not present

## 2022-10-18 DIAGNOSIS — M47812 Spondylosis without myelopathy or radiculopathy, cervical region: Secondary | ICD-10-CM | POA: Diagnosis not present

## 2022-10-18 DIAGNOSIS — I6782 Cerebral ischemia: Secondary | ICD-10-CM | POA: Diagnosis not present

## 2022-10-18 DIAGNOSIS — R918 Other nonspecific abnormal finding of lung field: Secondary | ICD-10-CM

## 2022-10-18 DIAGNOSIS — R55 Syncope and collapse: Secondary | ICD-10-CM | POA: Diagnosis not present

## 2022-10-18 DIAGNOSIS — M50323 Other cervical disc degeneration at C6-C7 level: Secondary | ICD-10-CM | POA: Diagnosis not present

## 2022-10-18 MED ORDER — LIDOCAINE-EPINEPHRINE (PF) 2 %-1:200000 IJ SOLN
20.0000 mL | Freq: Once | INTRAMUSCULAR | Status: AC
  Administered 2022-10-18: 20 mL
  Filled 2022-10-18: qty 20

## 2022-10-18 MED ORDER — LOSARTAN POTASSIUM 50 MG PO TABS
25.0000 mg | ORAL_TABLET | Freq: Once | ORAL | Status: AC
  Administered 2022-10-18: 25 mg via ORAL
  Filled 2022-10-18: qty 1

## 2022-10-18 MED ORDER — AMLODIPINE BESYLATE 5 MG PO TABS
5.0000 mg | ORAL_TABLET | Freq: Once | ORAL | Status: AC
  Administered 2022-10-18: 5 mg via ORAL
  Filled 2022-10-18: qty 1

## 2022-10-18 MED ORDER — ACETAMINOPHEN 500 MG PO TABS
1000.0000 mg | ORAL_TABLET | Freq: Once | ORAL | Status: AC
  Administered 2022-10-18: 1000 mg via ORAL
  Filled 2022-10-18: qty 2

## 2022-10-18 MED ORDER — BACITRACIN ZINC 500 UNIT/GM EX OINT
TOPICAL_OINTMENT | Freq: Two times a day (BID) | CUTANEOUS | Status: DC
  Administered 2022-10-18: 1 via TOPICAL
  Filled 2022-10-18: qty 0.9

## 2022-10-18 NOTE — ED Triage Notes (Addendum)
Pt present to ED from [facility] facility d/t fall. Pt denies LOC, states standing in kitchen doorway, then suddenly falling to floor. C-collar intact, hematoma to back of head, dressing intact at this time. Pt denies pain/discomfort at this time. Pt A&Ox4 at this time. Pt hypertensive noted via EMS: BP: 218/108, HR: 60, 100% room air, glucose 90.

## 2022-10-18 NOTE — Discharge Instructions (Addendum)
It was our pleasure to provide your ER care today - we hope that you feel better.  Fall precautions - use great care/caution, and walker and/or assistance when up and about to help minimize risk of falling.   Icepack/coldpack to sore area. Keep wounds very clean and dry.  Have sutures removed by your doctor/nurse in 9-10 days.   Your imaging tests show no acute or severe traumatic injury - incidental note was made of lung nodules - discuss with primary care doctor at follow up.   Follow up with primary care doctor in the next 1-2 weeks.  Also have your blood pressure rechecked then as it is high today.   Return to ER if worse, new symptoms, new/severe pain, infection of wound, or other concern.

## 2022-10-18 NOTE — ED Notes (Signed)
Kris(son) updated on pt's current ED status.

## 2022-10-18 NOTE — ED Provider Notes (Signed)
EMERGENCY DEPARTMENT AT [facility] Provider Note   CSN: [id number] Arrival date & time: 10/18/22  0913     History  Chief Complaint  Patient presents with  . Fall    William Williamson is a 89 y.o. male.  Pt with hx Parkinson's/gait instability, presents s/p fall at SNF.  Hit head/contusion to back of head. Tetanus is up to date. Pt limited historian - pain localized to posterior scalp, denies other pain. No anticoagulant use. No vomiting. No chest pain or sob. No abd pain or nv. No extremity pain or injury.   The history is provided by the patient, medical records and the EMS personnel. The history is limited by the condition of the patient.  Fall Pertinent negatives include no chest pain, no abdominal pain and no shortness of breath.       Home Medications Prior to Admission medications   Medication Sig Start Date End Date Taking? Authorizing Provider  amLODipine (NORVASC) 10 MG tablet Take 1 tablet (10 mg total) by mouth daily. 05/10/22   [place], Eric J, DO  carbidopa-levodopa (SINEMET CR) 50-200 MG tablet Take 1 tablet by mouth at bedtime. 10/24/21   Tat, Octaviano Batty, DO  carbidopa-levodopa (SINEMET IR) 25-100 MG tablet TAKE 2 TABLETS BY MOUTH AT 7AM 11AM 3PM AND 1 TABLET AT 7PM 10/24/21   Tat, Octaviano Batty, DO  Cholecalciferol 25 MCG (1000 UT) CHEW Chew 1 tablet (1,000 Units total) by mouth 2 (two) times daily.    [provider]  levothyroxine (SYNTHROID) 100 MCG tablet TAKE 1 TABLET BY MOUTH ONCE DAILY BEFORE BREAKFAST Patient taking differently: Take 100 mcg by mouth daily before breakfast. 02/09/22   Zola Button, Grayling Congress, DO  losartan (COZAAR) 25 MG tablet Take 1 tablet (25 mg total) by mouth daily. 05/10/22   [place], Eric J, DO  melatonin 3 MG TABS tablet Take 1 tablet (3 mg total) by mouth at bedtime. 05/09/22   [place], Alvira Philips, DO  mirtazapine (REMERON) 7.5 MG tablet Take 1 tablet (7.5 mg total) by mouth at bedtime. 05/09/22   [place], Eric J, DO   NONFORMULARY OR COMPOUNDED ITEM Walker  #1   Dx parkinson disease 09/23/18   Zola Button, Grayling Congress, DO  polyethylene glycol (MIRALAX / GLYCOLAX) 17 g packet Take 17 g by mouth daily as needed for mild constipation. 05/09/22   [place], Eric J, DO  QUEtiapine (SEROQUEL) 25 MG tablet Take 1 tablet (25 mg total) by mouth at bedtime. 05/09/22   [place], Eric J, DO  senna-docusate (SENOKOT-S) 8.6-50 MG tablet Take 1 tablet by mouth 2 (two) times daily. 05/09/22   [place], Eric J, DO  simvastatin (ZOCOR) 20 MG tablet TAKE 1 TABLET BY MOUTH ONCE DAILY IN THE EVENING Patient taking differently: Take 20 mg by mouth every evening. 02/09/22   Donato Schultz, DO      Allergies    Garlic and Niacin    Review of Systems   Review of Systems  Constitutional:  Negative for fever.  Respiratory:  Negative for shortness of breath.   Cardiovascular:  Negative for chest pain.  Gastrointestinal:  Negative for abdominal pain and vomiting.  Genitourinary:  Negative for flank pain.  Musculoskeletal:  Negative for back pain and neck stiffness.  Skin:  Positive for wound.  Neurological:  Negative for weakness and numbness.    Physical Exam Updated Vital Signs BP (!) 223/93 (BP Location: Right Arm)   Pulse (!) 57   Temp (!)  97.5 F (36.4 C) (Oral)   Resp 20   Ht 1.854 m (6\' 1" )   Wt 70 kg   SpO2 95%   BMI 20.36 kg/m  Physical Exam Vitals and nursing note reviewed.  Constitutional:      Appearance: Normal appearance. He is well-developed.  HENT:     Head:     Comments: Contusion with laceration to posterior scalp, ~ 3.5    Nose: Nose normal.     Mouth/Throat:     Mouth: Mucous membranes are moist.     Pharynx: Oropharynx is clear.  Eyes:     General: No scleral icterus.    Conjunctiva/sclera: Conjunctivae normal.     Pupils: Pupils are equal, round, and reactive to light.  Neck:     Vascular: No carotid bruit.     Trachea: No tracheal deviation.  Cardiovascular:     Rate and Rhythm: Normal  rate and regular rhythm.     Pulses: Normal pulses.     Heart sounds: Normal heart sounds. No murmur heard.    No friction rub. No gallop.  Pulmonary:     Effort: Pulmonary effort is normal. No accessory muscle usage or respiratory distress.     Breath sounds: Normal breath sounds.  Chest:     Chest wall: No tenderness.  Abdominal:     General: Bowel sounds are normal. There is no distension.     Palpations: Abdomen is soft.     Tenderness: There is no abdominal tenderness.  Musculoskeletal:        General: No swelling.     Cervical back: Normal range of motion and neck supple. No rigidity or tenderness.     Comments: ?mild mid cervical tenderness, otherwise, CTLS spine, non tender, aligned, no step off. Good rom bil extremities without pain or focal bony tenderness.    Skin:    General: Skin is warm and dry.     Findings: No rash.  Neurological:     Mental Status: He is alert.     Comments: Alert, speech clear. GCS 15. Motor/sens grossly intact bil.   Psychiatric:        Mood and Affect: Mood normal.    ED Results / Procedures / Treatments   Labs (all labs ordered are listed, but only abnormal results are displayed) Labs Reviewed - No data to display  EKG None  Radiology No results found.  Procedures .Marland KitchenLaceration Repair  Date/Time: 10/18/2022 12:15 PM  Performed by: Cathren Laine, MD Authorized by: Cathren Laine, MD   Consent:    Consent given by:  Patient Anesthesia:    Anesthesia method:  Local infiltration   Local anesthetic:  Lidocaine 2% WITH epi Laceration details:    Location:  Scalp   Scalp location:  Occipital   Length (cm):  3.5 Pre-procedure details:    Preparation:  Patient was prepped and draped in usual sterile fashion and imaging obtained to evaluate for foreign bodies Exploration:    Imaging outcome: foreign body not noted     Wound exploration: entire depth of wound visualized   Treatment:    Area cleansed with:  Saline   Amount of  cleaning:  Standard   Irrigation solution:  Sterile saline   Visualized foreign bodies/material removed: no     Layers/structures repaired:  Deep subcutaneous Deep subcutaneous:    Suture size:  4-0   Suture material:  Vicryl   Suture technique:  Simple interrupted Skin repair:    Repair method:  Sutures  Suture size:  4-0   Suture material:  Prolene   Suture technique:  Simple interrupted   Number of sutures:  5 Repair type:    Repair type:  Intermediate Post-procedure details:    Dressing:  Antibiotic ointment and sterile dressing   Procedure completion:  Tolerated well, no immediate complications     Medications Ordered in ED Medications - No data to display  ED Course/ Medical Decision Making/ A&P                             Medical Decision Making Problems Addressed: Contusion of scalp, initial encounter: acute illness or injury Elevated blood pressure reading: acute illness or injury Fall from slip, trip, or stumble, initial encounter: acute illness or injury with systemic symptoms that poses a threat to life or bodily functions Laceration of scalp, initial encounter: acute illness or injury with systemic symptoms that poses a threat to life or bodily functions Parkinson's disease with dyskinesia, unspecified whether manifestations fluctuate: chronic illness or injury with exacerbation, progression, or side effects of treatment that poses a threat to life or bodily functions Pulmonary nodules: chronic illness or injury Uncontrolled hypertension: acute illness or injury  Amount and/or Complexity of Data Reviewed Independent Historian: EMS    Details: hx External Data Reviewed: notes. Radiology: ordered and independent interpretation performed. Decision-making details documented in ED Course.  Risk OTC drugs. Prescription drug management. Decision regarding hospitalization.   Imaging ordered.   Diff dx includes head injury/SDH, cervical fx, head contusion,  etc. Dispo decision including potential need for admission considered - will get imaging and reassess.   Reviewed nursing notes and prior charts for additional history.   Wound cleaned, bacitracin and sterile dressing.   MAR would indicate pt has not received his meds/bp med yet today - will give dose as bp is quite high.  CT reviewed/interpreted by me - no fx.   Wound sutured. Sterile dressing.  Acetaminophen po.   Recheck, bp improved 175/95, pt alert, no c/o.  Pt currently appears stable for d/c.  Return precautions provided.          Final Clinical Impression(s) / ED Diagnoses Final diagnoses:  None    Rx / DC Orders ED Discharge Orders     None         Cathren Laine, MD 10/18/22 1220

## 2022-10-19 ENCOUNTER — Emergency Department (HOSPITAL_COMMUNITY)
Admission: EM | Admit: 2022-10-19 | Discharge: 2022-10-19 | Disposition: A | Discharge status: Home / Self Care | Payer: HMO | Attending: Emergency Medicine | Admitting: Emergency Medicine

## 2022-10-19 ENCOUNTER — Emergency Department (HOSPITAL_COMMUNITY): Payer: HMO

## 2022-10-19 DIAGNOSIS — F0390 Unspecified dementia without behavioral disturbance: Secondary | ICD-10-CM | POA: Insufficient documentation

## 2022-10-19 DIAGNOSIS — I959 Hypotension, unspecified: Secondary | ICD-10-CM | POA: Diagnosis not present

## 2022-10-19 DIAGNOSIS — I6200 Nontraumatic subdural hemorrhage, unspecified: Secondary | ICD-10-CM | POA: Diagnosis not present

## 2022-10-19 DIAGNOSIS — R55 Syncope and collapse: Secondary | ICD-10-CM | POA: Insufficient documentation

## 2022-10-19 DIAGNOSIS — R4020 Unspecified coma: Secondary | ICD-10-CM

## 2022-10-19 DIAGNOSIS — R4182 Altered mental status, unspecified: Secondary | ICD-10-CM | POA: Insufficient documentation

## 2022-10-19 DIAGNOSIS — R0902 Hypoxemia: Secondary | ICD-10-CM | POA: Diagnosis not present

## 2022-10-19 DIAGNOSIS — R569 Unspecified convulsions: Secondary | ICD-10-CM | POA: Diagnosis not present

## 2022-10-19 DIAGNOSIS — R220 Localized swelling, mass and lump, head: Secondary | ICD-10-CM | POA: Diagnosis not present

## 2022-10-19 DIAGNOSIS — R000 Tachycardia, unspecified: Secondary | ICD-10-CM | POA: Diagnosis not present

## 2022-10-19 LAB — I-STAT VENOUS BLOOD GAS, ED
Acid-Base Excess: 2 mmol/L (ref 0.0–2.0)
Bicarbonate: 28.6 mmol/L — ABNORMAL HIGH (ref 20.0–28.0)
Calcium, Ion: 1.16 mmol/L (ref 1.15–1.40)
HCT: 33 % — ABNORMAL LOW (ref 39.0–52.0)
Hemoglobin: 11.2 g/dL — ABNORMAL LOW (ref 13.0–17.0)
O2 Saturation: 52 %
Potassium: 3.9 mmol/L (ref 3.5–5.1)
Sodium: 139 mmol/L (ref 135–145)
TCO2: 30 mmol/L (ref 22–32)
pCO2, Ven: 50.8 mmHg (ref 44–60)
pH, Ven: 7.358 (ref 7.25–7.43)
pO2, Ven: 29 mmHg — CL (ref 32–45)

## 2022-10-19 LAB — CBC WITH DIFFERENTIAL/PLATELET
Abs Immature Granulocytes: 0.01 10*3/uL (ref 0.00–0.07)
Basophils Absolute: 0 10*3/uL (ref 0.0–0.1)
Basophils Relative: 0 %
Eosinophils Absolute: 0 10*3/uL (ref 0.0–0.5)
Eosinophils Relative: 0 %
HCT: 32.1 % — ABNORMAL LOW (ref 39.0–52.0)
Hemoglobin: 10.6 g/dL — ABNORMAL LOW (ref 13.0–17.0)
Immature Granulocytes: 0 %
Lymphocytes Relative: 15 %
Lymphs Abs: 0.9 10*3/uL (ref 0.7–4.0)
MCH: 32 pg (ref 26.0–34.0)
MCHC: 33 g/dL (ref 30.0–36.0)
MCV: 97 fL (ref 80.0–100.0)
Monocytes Absolute: 0.5 10*3/uL (ref 0.1–1.0)
Monocytes Relative: 9 %
Neutro Abs: 4.5 10*3/uL (ref 1.7–7.7)
Neutrophils Relative %: 76 %
Platelets: 122 10*3/uL — ABNORMAL LOW (ref 150–400)
RBC: 3.31 MIL/uL — ABNORMAL LOW (ref 4.22–5.81)
RDW: 13.7 % (ref 11.5–15.5)
WBC: 6 10*3/uL (ref 4.0–10.5)
nRBC: 0 % (ref 0.0–0.2)

## 2022-10-19 LAB — COMPREHENSIVE METABOLIC PANEL
ALT: 14 U/L (ref 0–44)
AST: 16 U/L (ref 15–41)
Albumin: 3.9 g/dL (ref 3.5–5.0)
Alkaline Phosphatase: 42 U/L (ref 38–126)
Anion gap: 10 (ref 5–15)
BUN: 21 mg/dL (ref 8–23)
CO2: 27 mmol/L (ref 22–32)
Calcium: 8.9 mg/dL (ref 8.9–10.3)
Chloride: 102 mmol/L (ref 98–111)
Creatinine, Ser: 1.32 mg/dL — ABNORMAL HIGH (ref 0.61–1.24)
GFR, Estimated: 52 mL/min — ABNORMAL LOW (ref >60)
Glucose, Bld: 79 mg/dL (ref 70–99)
Potassium: 3.9 mmol/L (ref 3.5–5.1)
Sodium: 139 mmol/L (ref 135–145)
Total Bilirubin: 0.7 mg/dL (ref 0.3–1.2)
Total Protein: 6.1 g/dL — ABNORMAL LOW (ref 6.5–8.1)

## 2022-10-19 LAB — AMMONIA: Ammonia: 10 umol/L (ref 9–35)

## 2022-10-19 LAB — ETHANOL: Alcohol, Ethyl (B): <10 mg/dL (ref <10)

## 2022-10-19 LAB — LACTIC ACID, PLASMA: Lactic Acid, Venous: 1.7 mmol/L (ref 0.5–1.9)

## 2022-10-19 LAB — CBG MONITORING, ED: Glucose-Capillary: 76 mg/dL (ref 70–99)

## 2022-10-19 MED ORDER — CARBIDOPA-LEVODOPA 25-100 MG PO TABS: 2.0000 | ORAL_TABLET | Freq: Once | ORAL | Status: DC

## 2022-10-19 MED ORDER — DIPHENHYDRAMINE HCL 50 MG/ML IJ SOLN
25.0000 mg | Freq: Once | INTRAMUSCULAR | Status: AC
  Administered 2022-10-19: 25 mg via INTRAVENOUS

## 2022-10-19 MED ORDER — HALOPERIDOL LACTATE 5 MG/ML IJ SOLN
2.0000 mg | Freq: Once | INTRAMUSCULAR | Status: AC
  Administered 2022-10-19: 2 mg via INTRAVENOUS

## 2022-10-19 MED ORDER — CARBIDOPA-LEVODOPA ER 50-200 MG PO TBCR
1.0000 | EXTENDED_RELEASE_TABLET | Freq: Once | ORAL | Status: AC
  Administered 2022-10-19: 1 via ORAL
  Filled 2022-10-19: qty 1

## 2022-10-19 MED ORDER — SODIUM CHLORIDE 0.9 % IV BOLUS
1000.0000 mL | Freq: Once | INTRAVENOUS | Status: AC
  Administered 2022-10-19: 1000 mL via INTRAVENOUS

## 2022-10-19 NOTE — ED Notes (Signed)
Pt removed gown and vital monitoring and iv at this time. Pt was also saturated with urine. Priovider made aware

## 2022-10-19 NOTE — Discharge Instructions (Signed)
Follow up with your family doc in the office.  Return for worsening symptoms.

## 2022-10-19 NOTE — ED Provider Notes (Signed)
[place] [facility] DEPARTMENT AT [facility] Provider Note   CSN: [id number] Arrival date & time: 10/19/22  1027     History  Chief Complaint  Patient presents with   Loss of Consciousness    Memory care unit at a snf. DNR. Parkinsons and dementia. Fall yesterday and was seen and staples placed to back of head. Pt today syncope in front of staff. Lowered to the ground. Pt was unresponsive for 5 minutes. Ems reports pt hypotensive pta. 250cc bolus given. Initial bp 70/40 after fluid resuscitation pts sating 130/70 per ems. Pt is A & O x 4 upon arrival. Bg 125 pta.     William Williamson is a 89 y.o. male.  89 yo M with a cc of a syncopal event.  Found to be down for at least 5 min per facility.  Patient with advanced dementia and unable to provide history.  Hypotensive initially with EMS improved with IV fluids.  Patient tells me he has no complaints.    Loss of Consciousness      Home Medications Prior to Admission medications   Medication Sig Start Date End Date Taking? Authorizing Provider  amLODipine (NORVASC) 5 MG tablet Take 5 mg by mouth daily.   Yes [provider]  carbidopa-levodopa (SINEMET CR) 50-200 MG tablet Take 1 tablet by mouth at bedtime. 10/24/21  Yes Tat, Octaviano Batty, DO  carbidopa-levodopa (SINEMET IR) 25-100 MG tablet TAKE 2 TABLETS BY MOUTH AT 7AM 11AM 3PM AND 1 TABLET AT 7PM Patient taking differently: Take 2 tablets by mouth See admin instructions. TAKE 2 TABLETS BY MOUTH AT 6:30AM 10:30AM 2:30PM 9PM 10/24/21  Yes Tat, Octaviano Batty, DO  cetirizine (ZYRTEC) 5 MG tablet Take 2.5 mg by mouth daily.   Yes [provider]  Cholecalciferol (VITAMIN D3) 50 MCG (2000 UT) capsule Take 2,000 Units by mouth daily.   Yes [provider]  dorzolamide-timolol (COSOPT) 2-0.5 % ophthalmic solution Place 1 drop into both eyes 2 (two) times daily. 07/03/22  Yes [provider]  levothyroxine (SYNTHROID) 100 MCG tablet TAKE 1 TABLET BY  MOUTH ONCE DAILY BEFORE BREAKFAST Patient taking differently: Take 100 mcg by mouth daily before breakfast. 02/09/22  Yes Lowne Chase, Yvonne R, DO  LORazepam (ATIVAN) 0.5 MG tablet Take 0.25 mg by mouth 2 (two) times daily as needed for anxiety.   Yes [provider]  losartan (COZAAR) 25 MG tablet Take 1 tablet (25 mg total) by mouth daily. 05/10/22  Yes [place], Eric J, DO  LUMIGAN 0.01 % SOLN Place 1 drop into both eyes at bedtime. 07/16/22  Yes [provider]  melatonin 3 MG TABS tablet Take 1 tablet (3 mg total) by mouth at bedtime. 05/09/22  Yes [place], Eric J, DO  mirtazapine (REMERON) 7.5 MG tablet Take 1 tablet (7.5 mg total) by mouth at bedtime. 05/09/22  Yes [place], Eric J, DO  polyethylene glycol (MIRALAX / GLYCOLAX) 17 g packet Take 17 g by mouth daily as needed for mild constipation. 05/09/22  Yes [place], Eric J, DO  QUEtiapine (SEROQUEL) 25 MG tablet Take 1 tablet (25 mg total) by mouth at bedtime. Patient taking differently: Take 37.5 mg by mouth 2 (two) times daily. 05/09/22  Yes [place], Eric J, DO  senna-docusate (SENOKOT-S) 8.6-50 MG tablet Take 1 tablet by mouth 2 (two) times daily. 05/09/22  Yes [place], Eric J, DO  sertraline (ZOLOFT) 25 MG tablet Take 25 mg by mouth daily.   Yes [provider]  simvastatin (ZOCOR) 20 MG tablet TAKE 1 TABLET BY MOUTH ONCE DAILY IN THE EVENING Patient taking differently: Take 20 mg by mouth at bedtime. 02/09/22  Yes Seabron Spates R, DO  traZODone (DESYREL) 50 MG tablet Take 50 mg by mouth at bedtime.   Yes [provider]  amLODipine (NORVASC) 10 MG tablet Take 1 tablet (10 mg total) by mouth daily. Patient not taking: Reported on 10/18/2022 05/10/22   [place], Eric J, DO  Cholecalciferol 25 MCG (1000 UT) CHEW Chew 1 capsule by mouth daily. Patient not taking: Reported on 10/19/2022    [provider]  NONFORMULARY OR COMPOUNDED ITEM Walker  #1   Dx parkinson disease Patient not taking: Reported on  10/18/2022 09/23/18   Donato Schultz, DO      Allergies    Garlic and Niacin    Review of Systems   Review of Systems  Cardiovascular:  Positive for syncope.    Physical Exam Updated Vital Signs BP (!) 163/102   Pulse 69   Temp 97.8 F (36.6 C)   Resp (!) 25   SpO2 97%  Physical Exam Vitals and nursing note reviewed.  Constitutional:      Appearance: He is well-developed.  HENT:     Head: Normocephalic and atraumatic.  Eyes:     Pupils: Pupils are equal, round, and reactive to light.  Neck:     Vascular: No JVD.  Cardiovascular:     Rate and Rhythm: Normal rate and regular rhythm.     Heart sounds: No murmur heard.    No friction rub. No gallop.  Pulmonary:     Effort: No respiratory distress.     Breath sounds: No wheezing.  Abdominal:     General: There is no distension.     Tenderness: There is no abdominal tenderness. There is no guarding or rebound.  Musculoskeletal:        General: Normal range of motion.     Cervical back: Normal range of motion and neck supple.  Skin:    Coloration: Skin is not pale.     Findings: No rash.  Neurological:     Mental Status: He is alert.     Comments: Slow to respond.  Sleepy.    Psychiatric:        Behavior: Behavior normal.     ED Results / Procedures / Treatments   Labs (all labs ordered are listed, but only abnormal results are displayed) Labs Reviewed  COMPREHENSIVE METABOLIC PANEL - Abnormal; Notable for the following components:      Result Value   Creatinine, Ser 1.32 (*)    Total Protein 6.1 (*)    GFR, Estimated 52 (*)    All other components within normal limits  CBC WITH DIFFERENTIAL/PLATELET - Abnormal; Notable for the following components:   RBC 3.31 (*)    Hemoglobin 10.6 (*)    HCT 32.1 (*)    Platelets 122 (*)    All other components within normal limits  I-STAT VENOUS BLOOD GAS, ED - Abnormal; Notable for the following components:   pO2, Ven 29 (*)    Bicarbonate 28.6 (*)    HCT 33.0  (*)    Hemoglobin 11.2 (*)    All other components within normal limits  AMMONIA  ETHANOL  LACTIC ACID, PLASMA  URINALYSIS, ROUTINE W REFLEX MICROSCOPIC  CBG MONITORING, ED    EKG None  Radiology CT HEAD WO CONTRAST  Result Date: 10/19/2022 CLINICAL DATA:  Altered mental status EXAM: CT HEAD WITHOUT CONTRAST TECHNIQUE: Contiguous axial images were obtained from the base of the skull through the vertex without intravenous contrast. RADIATION DOSE REDUCTION: This exam was performed according to the departmental dose-optimization program which includes automated exposure control, adjustment of the mA and/or kV according to patient size and/or use of iterative reconstruction technique. COMPARISON:  CT head 1 day prior FINDINGS: Brain: There is trace parafalcine subdural blood posteriorly measuring up to 2 mm in thickness which is new compared to the study from 1 day prior (5-62). There is no mass effect. There is no other acute intracranial hemorrhage. There is no acute territorial infarct. Parenchymal volume is normal. The ventricles are normal in size. Gray-white differentiation is preserved. The pituitary and suprasellar region are normal. There is no mass lesion. There is no mass effect or midline shift. Vascular: No hyperdense vessel or unexpected calcification. Skull: Normal. Negative for fracture or focal lesion. Sinuses/Orbits: There is trace mucosal thickening in the paranasal sinuses. Bilateral lens implants are in place. The globes and orbits are otherwise unremarkable. Other: The mastoid air cells and middle ear cavities are clear. Left parietal scalp swelling is again seen. IMPRESSION: New trace parafalcine subdural hematoma without mass effect or midline shift. Critical Value/emergent results were called by telephone at the time of interpretation on 10/19/2022 at 12:13 pm to provider Sala Tague , who verbally acknowledged these results. Electronically Signed   By: Lesia Hausen M.D.   On:  10/19/2022 12:13   DG Chest Port 1 View  Result Date: 10/19/2022 CLINICAL DATA:  AMS EXAM: PORTABLE CHEST 1 VIEW COMPARISON:  05/17/2022. FINDINGS: Bilateral lung fields are clear. Bilateral costophrenic angles are clear. Normal cardio-mediastinal silhouette. No acute osseous abnormalities. The soft tissues are within normal limits. IMPRESSION: No active disease. Electronically Signed   By: Jules Schick M.D.   On: 10/19/2022 11:21   CT Head Wo Contrast  Result Date: 10/18/2022 CLINICAL DATA:  Provided history: Head trauma, minor.  Neck trauma. EXAM: CT HEAD WITHOUT CONTRAST CT CERVICAL SPINE WITHOUT CONTRAST TECHNIQUE: Multidetector CT imaging of the head and cervical spine was performed following the standard protocol without intravenous contrast. Multiplanar CT image reconstructions of the cervical spine were also generated. RADIATION DOSE REDUCTION: This exam was performed according to the departmental dose-optimization program which includes automated exposure control, adjustment of the mA and/or kV according to patient size and/or use of iterative reconstruction technique. COMPARISON:  Head CT 05/03/2022. Brain MRI 08/30/2017. Chest CT 05/03/2022. FINDINGS: CT HEAD FINDINGS Brain: Mild generalized cerebral atrophy. Patchy and ill-defined hypoattenuation within the cerebral white matter, nonspecific but compatible with minimal chronic small vessel ischemic disease. There is no acute intracranial hemorrhage. No demarcated cortical infarct. No extra-axial fluid collection. No evidence of an intracranial mass. No midline shift. Vascular: No hyperdense vessel.  Atherosclerotic calcifications. Skull: No calvarial fracture or aggressive osseous lesion. Sinuses/Orbits: No mass or acute finding within the imaged orbits. Mild mucosal thickening within the bilateral ethmoid and maxillary sinuses. Other: Left parietal scalp hematoma. CT CERVICAL SPINE FINDINGS Alignment: Mild C7-T1 grade 1 anterolisthesis. Skull  base and vertebrae: The basion-dental and atlanto-dental intervals are maintained.No evidence of acute fracture to the cervical spine. Soft tissues and spinal canal: No prevertebral fluid or swelling. No visible canal hematoma. Disc levels: Cervical spondylosis with multilevel disc space narrowing, disc bulges/central disc protrusions, endplate spurring, uncovertebral hypertrophy and facet arthrosis. Posterior disc osteophyte complex at C6-C7. Disc space narrowing is greatest at C6-C7 (moderate/advanced at this  level). No appreciable high-grade spinal canal stenosis. Multilevel bony neural foraminal narrowing. Upper chest: No consolidation within the imaged lung apices. Persistent scattered and clustered pulmonary nodules within the right upper lobe, incompletely imaged on the current exam. At the imaged levels, nodules measure up to 8 mm (series 4, image 143). Centrilobular emphysema. No visible pneumothorax. IMPRESSION: CT head: 1.  No evidence of an acute intracranial abnormality. 2. Left parietal scalp hematoma. 3. Mild cerebral atrophy and cerebral white matter chronic small ischemic disease. 4. Mild paranasal sinus disease as described. CT cervical spine: 1. No evidence of an acute cervical spine fracture. 2. Mild grade 1 anterolisthesis at C7-T1. 3. Cervical spondylosis as described. 4. Persistent scattered and clustered pulmonary nodules within the right upper lobe, incompletely imaged on the current exam. A non-emergent chest CT is recommended for further evaluation. Additionally, please review the findings and recommendations on the prior chest CT of 05/03/2022. Electronically Signed   By: Jackey Loge D.O.   On: 10/18/2022 10:54   CT Cervical Spine Wo Contrast  Result Date: 10/18/2022 CLINICAL DATA:  Provided history: Head trauma, minor.  Neck trauma. EXAM: CT HEAD WITHOUT CONTRAST CT CERVICAL SPINE WITHOUT CONTRAST TECHNIQUE: Multidetector CT imaging of the head and cervical spine was performed  following the standard protocol without intravenous contrast. Multiplanar CT image reconstructions of the cervical spine were also generated. RADIATION DOSE REDUCTION: This exam was performed according to the departmental dose-optimization program which includes automated exposure control, adjustment of the mA and/or kV according to patient size and/or use of iterative reconstruction technique. COMPARISON:  Head CT 05/03/2022. Brain MRI 08/30/2017. Chest CT 05/03/2022. FINDINGS: CT HEAD FINDINGS Brain: Mild generalized cerebral atrophy. Patchy and ill-defined hypoattenuation within the cerebral white matter, nonspecific but compatible with minimal chronic small vessel ischemic disease. There is no acute intracranial hemorrhage. No demarcated cortical infarct. No extra-axial fluid collection. No evidence of an intracranial mass. No midline shift. Vascular: No hyperdense vessel.  Atherosclerotic calcifications. Skull: No calvarial fracture or aggressive osseous lesion. Sinuses/Orbits: No mass or acute finding within the imaged orbits. Mild mucosal thickening within the bilateral ethmoid and maxillary sinuses. Other: Left parietal scalp hematoma. CT CERVICAL SPINE FINDINGS Alignment: Mild C7-T1 grade 1 anterolisthesis. Skull base and vertebrae: The basion-dental and atlanto-dental intervals are maintained.No evidence of acute fracture to the cervical spine. Soft tissues and spinal canal: No prevertebral fluid or swelling. No visible canal hematoma. Disc levels: Cervical spondylosis with multilevel disc space narrowing, disc bulges/central disc protrusions, endplate spurring, uncovertebral hypertrophy and facet arthrosis. Posterior disc osteophyte complex at C6-C7. Disc space narrowing is greatest at C6-C7 (moderate/advanced at this level). No appreciable high-grade spinal canal stenosis. Multilevel bony neural foraminal narrowing. Upper chest: No consolidation within the imaged lung apices. Persistent scattered and  clustered pulmonary nodules within the right upper lobe, incompletely imaged on the current exam. At the imaged levels, nodules measure up to 8 mm (series 4, image 143). Centrilobular emphysema. No visible pneumothorax. IMPRESSION: CT head: 1.  No evidence of an acute intracranial abnormality. 2. Left parietal scalp hematoma. 3. Mild cerebral atrophy and cerebral white matter chronic small ischemic disease. 4. Mild paranasal sinus disease as described. CT cervical spine: 1. No evidence of an acute cervical spine fracture. 2. Mild grade 1 anterolisthesis at C7-T1. 3. Cervical spondylosis as described. 4. Persistent scattered and clustered pulmonary nodules within the right upper lobe, incompletely imaged on the current exam. A non-emergent chest CT is recommended for further evaluation. Additionally, please review the findings  and recommendations on the prior chest CT of 05/03/2022. Electronically Signed   By: Jackey Loge D.O.   On: 10/18/2022 10:54    Procedures Procedures    Medications Ordered in ED Medications  carbidopa-levodopa (SINEMET IR) 25-100 MG per tablet immediate release 2 tablet (has no administration in time range)  sodium chloride 0.9 % bolus 1,000 mL (0 mLs Intravenous Stopped 10/19/22 1216)  haloperidol lactate (HALDOL) injection 2 mg (2 mg Intravenous Given 10/19/22 1351)  diphenhydrAMINE (BENADRYL) injection 25 mg (25 mg Intravenous Given 10/19/22 1351)  carbidopa-levodopa (SINEMET CR) 50-200 MG per tablet controlled release 1 tablet (1 tablet Oral Given 10/19/22 1418)    ED Course/ Medical Decision Making/ A&P                             Medical Decision Making Amount and/or Complexity of Data Reviewed Labs: ordered. Radiology: ordered.  Risk Prescription drug management.   89 yo M with a  cc of a loss of consciousness.  Occurred at his facility.  Seen here yesterday for fall and had staples placed to the scalp.  Patient very sleepy on exam.  Initially hypotensive with  EMS.  Will obtain lab eval, ct head.  Reassess.   CT head with concern for SDH along the falx.  Discussed with Dr. Wynetta Emery.  No acute neurosurgical intervention.  Did not recommend repeat imaging.    CXR independently interpreted by me without focal infiltrate or ptx.   Awaiting for labs.  Labs without hypercarbia, lactic acid normal. Ammonia normal.  No significant electrolyte abnormality, etoh negative.  Was notified by nursing patient agitated.  Sleepy on my exam and hard to arouse.    Will discuss with medicine.  Discussion with hospitalist, recommending sinemet dose to assess for improvement.   Some improvement.  Rediscussed with Dr. Katrinka Blazing, felt admission to unlikely benefit patient.  D/c back to memory care.  The patients results and plan were reviewed and discussed.   Any x-rays performed were independently reviewed by myself.   Differential diagnosis were considered with the presenting HPI.  Medications  carbidopa-levodopa (SINEMET IR) 25-100 MG per tablet immediate release 2 tablet (has no administration in time range)  sodium chloride 0.9 % bolus 1,000 mL (0 mLs Intravenous Stopped 10/19/22 1216)  haloperidol lactate (HALDOL) injection 2 mg (2 mg Intravenous Given 10/19/22 1351)  diphenhydrAMINE (BENADRYL) injection 25 mg (25 mg Intravenous Given 10/19/22 1351)  carbidopa-levodopa (SINEMET CR) 50-200 MG per tablet controlled release 1 tablet (1 tablet Oral Given 10/19/22 1418)    Vitals:   10/19/22 1330 10/19/22 1345 10/19/22 1415 10/19/22 1445  BP: (!) 142/117 (!) 189/85 (!) 163/86 (!) 163/102  Pulse: 67 62 (!) 54 69  Resp: 12 13 (!) 22 (!) 25  Temp:      TempSrc:      SpO2: 93% 98% 98% 97%    Final diagnoses:  Loss of consciousness (HCC)    Admission/ observation were discussed with the admitting physician, patient and/or family and they are comfortable with the plan.          Final Clinical Impression(s) / ED Diagnoses Final diagnoses:  Loss of  consciousness [facility])    Rx / DC Orders ED Discharge Orders     None         Melene Plan, DO 10/19/22 1539

## 2022-10-26 NOTE — Progress Notes (Unsigned)
Assessment/Plan:   1.  Parkinsons Disease             -Continue carbidopa/levodopa 2 tablets at 7am/11am/3pm and 1 tablet at 7pm.  Discussed various pillboxes.             -Continue carbidopa/levodopa 50/200 CR at bedtime   -he reported didn't think carbidopa/levodopa helpful.  Discussed that I knew it was working as he has Parkinsons Disease dyskinesia.  However, he doesn't have off time, which is good.  He is very off balance but declines PT             -Declines therapies again today.  Discussed it would help falls.  Discussed walker at all times.  He is resistant to all of these ideas.  Discussed morbidity and mortality with falls  -discussed [facility].  Discussed SNF care   2.  PDD             -They are looking into higher level of care.  Patient certainly needs 24 hour/day care.             -Patient has been found wandering previously.             -increase quetiapine, 12.5 mg bid.  May need more than this.  Having lots of delusions and some hallucinations     3.  Insomnia             -On mirtazapine by primary care.    4.  Syncope  -discuss lasix dose with pcp.  May be due to Neurogenic Orthostatic Hypotension     Subjective:   William Williamson was seen today in follow up for Parkinsons disease.  My previous records were reviewed prior to todays visit as well as outside records available to me.  Patient accompanied by his son who supplements the history.  Patient was admitted to the hospital in February with mental status change. Current prescribed movement disorder medications: Carbidopa/levodopa 25/100, 2 tablets at 7am/11am/3pm and 1 tablet at 7pm  Carbidopa/levodopa 50/200 CR at bedtime. Quetiapine, 12.5 mg at bedtime (started last visit but he isn't taking)   ALLERGIES:   Allergies  Allergen Reactions   Garlic Nausea Only   Niacin     REACTION: flushing    CURRENT MEDICATIONS:  Outpatient Encounter Medications as of 10/30/2022  Medication Sig    amLODipine (NORVASC) 10 MG tablet Take 1 tablet (10 mg total) by mouth daily. (Patient not taking: Reported on 10/18/2022)   amLODipine (NORVASC) 5 MG tablet Take 5 mg by mouth daily.   carbidopa-levodopa (SINEMET CR) 50-200 MG tablet Take 1 tablet by mouth at bedtime.   carbidopa-levodopa (SINEMET IR) 25-100 MG tablet TAKE 2 TABLETS BY MOUTH AT 7AM 11AM 3PM AND 1 TABLET AT 7PM (Patient taking differently: Take 2 tablets by mouth See admin instructions. TAKE 2 TABLETS BY MOUTH AT 6:30AM 10:30AM 2:30PM 9PM)   cetirizine (ZYRTEC) 5 MG tablet Take 2.5 mg by mouth daily.   Cholecalciferol (VITAMIN D3) 50 MCG (2000 UT) capsule Take 2,000 Units by mouth daily.   Cholecalciferol 25 MCG (1000 UT) CHEW Chew 1 capsule by mouth daily. (Patient not taking: Reported on 10/19/2022)   dorzolamide-timolol (COSOPT) 2-0.5 % ophthalmic solution Place 1 drop into both eyes 2 (two) times daily.   levothyroxine (SYNTHROID) 100 MCG tablet TAKE 1 TABLET BY MOUTH ONCE DAILY BEFORE BREAKFAST (Patient taking differently: Take 100 mcg by mouth daily before breakfast.)   LORazepam (ATIVAN) 0.5 MG tablet Take  0.25 mg by mouth 2 (two) times daily as needed for anxiety.   losartan (COZAAR) 25 MG tablet Take 1 tablet (25 mg total) by mouth daily.   LUMIGAN 0.01 % SOLN Place 1 drop into both eyes at bedtime.   melatonin 3 MG TABS tablet Take 1 tablet (3 mg total) by mouth at bedtime.   mirtazapine (REMERON) 7.5 MG tablet Take 1 tablet (7.5 mg total) by mouth at bedtime.   NONFORMULARY OR COMPOUNDED ITEM Walker  #1   Dx parkinson disease (Patient not taking: Reported on 10/18/2022)   polyethylene glycol (MIRALAX / GLYCOLAX) 17 g packet Take 17 g by mouth daily as needed for mild constipation.   QUEtiapine (SEROQUEL) 25 MG tablet Take 1 tablet (25 mg total) by mouth at bedtime. (Patient taking differently: Take 37.5 mg by mouth 2 (two) times daily.)   senna-docusate (SENOKOT-S) 8.6-50 MG tablet Take 1 tablet by mouth 2 (two) times  daily.   sertraline (ZOLOFT) 25 MG tablet Take 25 mg by mouth daily.   simvastatin (ZOCOR) 20 MG tablet TAKE 1 TABLET BY MOUTH ONCE DAILY IN THE EVENING (Patient taking differently: Take 20 mg by mouth at bedtime.)   traZODone (DESYREL) 50 MG tablet Take 50 mg by mouth at bedtime.   No facility-administered encounter medications on file as of 10/30/2022.    Objective:   PHYSICAL EXAMINATION:    VITALS:   There were no vitals filed for this visit.  Wt Readings from Last 3 Encounters:  10/18/22 154 lb 5.2 oz (70 kg)  05/17/22 156 lb 4.9 oz (70.9 kg)  05/06/22 146 lb 13.2 oz (66.6 kg)    GEN:  The patient appears stated age and is in NAD. HEENT:  Normocephalic, atraumatic.  The mucous membranes are moist. The superficial temporal arteries are without ropiness or tenderness. CV:  RRR Lungs:  CTAB Neck/HEME:  There are no carotid bruits bilaterally.  Neurological examination:  Orientation: The patient is alert and oriented to person and place today.  Intermittently confused. Cranial nerves: There is good facial symmetry with significant facial hypomimia. The speech is fluent and hypophonic. Soft palate rises symmetrically and there is no tongue deviation. Hearing is intact to conversational tone. Sensation: Sensation is intact to light touch throughout Motor: Strength is at least antigravity x4.  Movement examination: Tone: There is normal tone in the UE/LE Abnormal movements:  he has mild truncal dyskinesia.  This is the same as last visit Coordination:  There is decremation with RAM's, esp with toe taps on the L  I have reviewed and interpreted the following labs independently    Chemistry      Component Value Date/Time   NA 139 10/19/2022 1106   K 3.9 10/19/2022 1106   CL 102 10/19/2022 1029   CO2 27 10/19/2022 1029   BUN 21 10/19/2022 1029   CREATININE 1.32 (H) 10/19/2022 1029      Component Value Date/Time   CALCIUM 8.9 10/19/2022 1029   ALKPHOS 42 10/19/2022 1029    AST 16 10/19/2022 1029   ALT 14 10/19/2022 1029   BILITOT 0.7 10/19/2022 1029       Lab Results  Component Value Date   WBC 6.0 10/19/2022   HGB 11.2 (L) 10/19/2022   HCT 33.0 (L) 10/19/2022   MCV 97.0 10/19/2022   PLT 122 (L) 10/19/2022    Lab Results  Component Value Date   TSH 4.452 05/17/2022     Total time spent on today's visit was ***  minutes, including both face-to-face time and nonface-to-face time.  Time included that spent on review of records (prior notes available to me/labs/imaging if pertinent), discussing treatment and goals, answering patient's questions and coordinating care.  Cc:  Donato Schultz, DO

## 2022-10-29 DIAGNOSIS — K5900 Constipation, unspecified: Secondary | ICD-10-CM | POA: Diagnosis not present

## 2022-10-29 DIAGNOSIS — F0390 Unspecified dementia without behavioral disturbance: Secondary | ICD-10-CM | POA: Diagnosis not present

## 2022-10-29 DIAGNOSIS — E559 Vitamin D deficiency, unspecified: Secondary | ICD-10-CM | POA: Diagnosis not present

## 2022-10-29 DIAGNOSIS — R5382 Chronic fatigue, unspecified: Secondary | ICD-10-CM | POA: Diagnosis not present

## 2022-10-29 DIAGNOSIS — I950 Idiopathic hypotension: Secondary | ICD-10-CM | POA: Diagnosis not present

## 2022-10-29 DIAGNOSIS — G20A1 Parkinson's disease without dyskinesia, without mention of fluctuations: Secondary | ICD-10-CM | POA: Diagnosis not present

## 2022-10-29 DIAGNOSIS — F5101 Primary insomnia: Secondary | ICD-10-CM | POA: Diagnosis not present

## 2022-10-29 DIAGNOSIS — E782 Mixed hyperlipidemia: Secondary | ICD-10-CM | POA: Diagnosis not present

## 2022-10-29 DIAGNOSIS — E039 Hypothyroidism, unspecified: Secondary | ICD-10-CM | POA: Diagnosis not present

## 2022-10-30 ENCOUNTER — Ambulatory Visit (INDEPENDENT_AMBULATORY_CARE_PROVIDER_SITE_OTHER): Payer: HMO | Admitting: Neurology

## 2022-10-30 ENCOUNTER — Encounter: Payer: Self-pay | Admitting: Neurology

## 2022-10-30 ENCOUNTER — Ambulatory Visit: Payer: HMO

## 2022-10-30 ENCOUNTER — Telehealth: Payer: Self-pay

## 2022-10-30 ENCOUNTER — Other Ambulatory Visit: Payer: Self-pay

## 2022-10-30 VITALS — BP 116/72 | HR 59 | Ht 73.0 in

## 2022-10-30 DIAGNOSIS — R55 Syncope and collapse: Secondary | ICD-10-CM

## 2022-10-30 DIAGNOSIS — F02818 Dementia in other diseases classified elsewhere, unspecified severity, with other behavioral disturbance: Secondary | ICD-10-CM

## 2022-10-30 DIAGNOSIS — G20B2 Parkinson's disease with dyskinesia, with fluctuations: Secondary | ICD-10-CM | POA: Diagnosis not present

## 2022-10-30 DIAGNOSIS — G20A1 Parkinson's disease without dyskinesia, without mention of fluctuations: Secondary | ICD-10-CM

## 2022-10-30 DIAGNOSIS — S065XAA Traumatic subdural hemorrhage with loss of consciousness status unknown, initial encounter: Secondary | ICD-10-CM | POA: Diagnosis not present

## 2022-10-30 MED ORDER — QUETIAPINE FUMARATE 25 MG PO TABS
37.5000 mg | ORAL_TABLET | Freq: Two times a day (BID) | ORAL | Status: DC
Start: 2022-10-30 — End: 2022-12-24

## 2022-10-30 MED ORDER — QUETIAPINE FUMARATE 25 MG PO TABS
50.0000 mg | ORAL_TABLET | Freq: Two times a day (BID) | ORAL | Status: DC
Start: 2022-10-30 — End: 2022-10-30

## 2022-10-30 NOTE — Telephone Encounter (Signed)
Son came back in office to say the facility was wrong with dose. Patient is taking 37.5 BID at 9AM and a 8PM . Son would like to know if there is anything they would like to do

## 2022-10-30 NOTE — Patient Instructions (Signed)
A referral to [facility] has been placed for your CT scan someone will contact you directly to schedule your appt. They are located at [address] [facility]. Please contact them directly by calling [phone] with any questions regarding your referral.

## 2022-10-30 NOTE — Telephone Encounter (Signed)
Called patients son gave recommendations about Seroquel dosing and gave EEG results. Patients son would like to hold off at this time before starting another med to see why they changed his dose at the facility for Seroquel he feels it is from urinating in places day and night , and see what his dad needs he will call us back if he is needing or wanting to change his dads meds

## 2022-10-30 NOTE — Telephone Encounter (Signed)
Patients son wanted to know if the increase in Seroquel could be causing the cognitive changes as well as the passing out spells. I changed in chart but patient is taking 50 mg Seroquel BID

## 2022-10-30 NOTE — Progress Notes (Signed)
EEG complete - results pending 

## 2022-10-30 NOTE — Procedures (Signed)
TECHNICAL SUMMARY:  A multichannel referential and bipolar montage EEG using the standard international 10-20 system was performed on the patient described as awake, drowsy and asleep.  The dominant background activity consists of 8 hertz activity seen most prominantly over the posterior head region.  Reactivity to eye opening and closing is not performed.  Low voltage fast (beta) activity is distributed symmetrically and maximally over the anterior head regions.  ACTIVATION:  Stepwise photic stimulation and hyperventilation was not completed  EPILEPTIFORM ACTIVITY:  There were no spikes, sharp waves or paroxysmal activity.  SLEEP: Stage I and stage II sleep architecture are identified  CARDIAC:  The EKG lead revealed a regular rhythm.  IMPRESSION:  This is a normal EEG for the patients stated age.  There were no focal, hemispheric or lateralizing features.  No epileptiform activity was recorded.  A normal EEG does not exclude the diagnosis of a seizure disorder and if seizure remains high on the list of differential diagnosis, an ambulatory EEG may be of value.  Clinical correlation is required.

## 2022-10-30 NOTE — Telephone Encounter (Signed)
William Williamson, in addition to the other info the son needed - let him know that his EEG looked good.  Tell him that an ambulatory EEG would be much more beneficial, but I know he said his father would not tolerate it.  We could trial him on seizure medication, if he would like to see if the spells would go away.  I am happy to do that, but would still recommend he follow-up with cardiology as well.  Let us know what he would like to do.

## 2022-10-30 NOTE — Addendum Note (Signed)
Addended by: Kerin Salen S on: 10/30/2022 12:47 PM   Modules accepted: Level of Service

## 2022-10-31 ENCOUNTER — Other Ambulatory Visit: Payer: HMO

## 2022-11-06 DIAGNOSIS — Z79899 Other long term (current) drug therapy: Secondary | ICD-10-CM | POA: Diagnosis not present

## 2022-11-06 DIAGNOSIS — G20C Parkinsonism, unspecified: Secondary | ICD-10-CM | POA: Diagnosis not present

## 2022-11-06 DIAGNOSIS — F331 Major depressive disorder, recurrent, moderate: Secondary | ICD-10-CM | POA: Diagnosis not present

## 2022-11-06 DIAGNOSIS — F411 Generalized anxiety disorder: Secondary | ICD-10-CM | POA: Diagnosis not present

## 2022-11-06 DIAGNOSIS — F5105 Insomnia due to other mental disorder: Secondary | ICD-10-CM | POA: Diagnosis not present

## 2022-11-12 DIAGNOSIS — E039 Hypothyroidism, unspecified: Secondary | ICD-10-CM | POA: Diagnosis not present

## 2022-11-12 DIAGNOSIS — G20A1 Parkinson's disease without dyskinesia, without mention of fluctuations: Secondary | ICD-10-CM | POA: Diagnosis not present

## 2022-11-12 DIAGNOSIS — R5382 Chronic fatigue, unspecified: Secondary | ICD-10-CM | POA: Diagnosis not present

## 2022-11-12 DIAGNOSIS — R296 Repeated falls: Secondary | ICD-10-CM | POA: Diagnosis not present

## 2022-11-13 DIAGNOSIS — I10 Essential (primary) hypertension: Secondary | ICD-10-CM | POA: Diagnosis not present

## 2022-11-13 DIAGNOSIS — R1310 Dysphagia, unspecified: Secondary | ICD-10-CM | POA: Diagnosis not present

## 2022-11-13 DIAGNOSIS — G9341 Metabolic encephalopathy: Secondary | ICD-10-CM | POA: Diagnosis not present

## 2022-11-13 DIAGNOSIS — R1312 Dysphagia, oropharyngeal phase: Secondary | ICD-10-CM | POA: Diagnosis not present

## 2022-11-13 DIAGNOSIS — G20A1 Parkinson's disease without dyskinesia, without mention of fluctuations: Secondary | ICD-10-CM | POA: Diagnosis not present

## 2022-11-13 DIAGNOSIS — F02818 Dementia in other diseases classified elsewhere, unspecified severity, with other behavioral disturbance: Secondary | ICD-10-CM | POA: Diagnosis not present

## 2022-12-14 DIAGNOSIS — F02818 Dementia in other diseases classified elsewhere, unspecified severity, with other behavioral disturbance: Secondary | ICD-10-CM | POA: Diagnosis not present

## 2022-12-14 DIAGNOSIS — G20A1 Parkinson's disease without dyskinesia, without mention of fluctuations: Secondary | ICD-10-CM | POA: Diagnosis not present

## 2022-12-14 DIAGNOSIS — I10 Essential (primary) hypertension: Secondary | ICD-10-CM | POA: Diagnosis not present

## 2022-12-14 DIAGNOSIS — G9341 Metabolic encephalopathy: Secondary | ICD-10-CM | POA: Diagnosis not present

## 2022-12-14 DIAGNOSIS — R1310 Dysphagia, unspecified: Secondary | ICD-10-CM | POA: Diagnosis not present

## 2022-12-14 DIAGNOSIS — R1312 Dysphagia, oropharyngeal phase: Secondary | ICD-10-CM | POA: Diagnosis not present

## 2022-12-24 ENCOUNTER — Telehealth: Payer: Self-pay | Admitting: Neurology

## 2022-12-24 NOTE — Telephone Encounter (Signed)
Pt's son called in to cancel the pt's follow up appt. The pt passed away 12/21/2022.

## 2023-01-01 DEATH — deceased

## 2023-01-02 ENCOUNTER — Encounter: Payer: Self-pay | Admitting: Family Medicine

## 2023-05-02 ENCOUNTER — Ambulatory Visit: Payer: HMO | Admitting: Neurology
